# Patient Record
Sex: Male | Born: 1945 | Race: White | Hispanic: Yes | Smoking: Never smoker
Health system: Southern US, Community
[De-identification: ages and names within clinical notes are randomized; demographics above are authoritative.]

## PROBLEM LIST (undated history)

## (undated) DIAGNOSIS — K659 Peritonitis, unspecified: Secondary | ICD-10-CM

## (undated) DIAGNOSIS — Z8719 Personal history of other diseases of the digestive system: Secondary | ICD-10-CM

## (undated) DIAGNOSIS — R160 Hepatomegaly, not elsewhere classified: Secondary | ICD-10-CM

## (undated) DIAGNOSIS — R739 Hyperglycemia, unspecified: Secondary | ICD-10-CM

## (undated) DIAGNOSIS — R339 Retention of urine, unspecified: Secondary | ICD-10-CM

## (undated) DIAGNOSIS — G7000 Myasthenia gravis without (acute) exacerbation: Secondary | ICD-10-CM

## (undated) DIAGNOSIS — T380X5A Adverse effect of glucocorticoids and synthetic analogues, initial encounter: Secondary | ICD-10-CM

## (undated) DIAGNOSIS — N281 Cyst of kidney, acquired: Secondary | ICD-10-CM

## (undated) DIAGNOSIS — Z87438 Personal history of other diseases of male genital organs: Secondary | ICD-10-CM

## (undated) DIAGNOSIS — M545 Low back pain, unspecified: Secondary | ICD-10-CM

## (undated) DIAGNOSIS — N400 Enlarged prostate without lower urinary tract symptoms: Secondary | ICD-10-CM

## (undated) DIAGNOSIS — M479 Spondylosis, unspecified: Secondary | ICD-10-CM

## (undated) DIAGNOSIS — G9340 Encephalopathy, unspecified: Secondary | ICD-10-CM

## (undated) DIAGNOSIS — Z978 Presence of other specified devices: Secondary | ICD-10-CM

## (undated) DIAGNOSIS — I10 Essential (primary) hypertension: Secondary | ICD-10-CM

## (undated) DIAGNOSIS — D62 Acute posthemorrhagic anemia: Secondary | ICD-10-CM

## (undated) DIAGNOSIS — K760 Fatty (change of) liver, not elsewhere classified: Secondary | ICD-10-CM

## (undated) DIAGNOSIS — D509 Iron deficiency anemia, unspecified: Secondary | ICD-10-CM

## (undated) DIAGNOSIS — Z960 Presence of urogenital implants: Secondary | ICD-10-CM

## (undated) DIAGNOSIS — R351 Nocturia: Secondary | ICD-10-CM

## (undated) DIAGNOSIS — K5720 Diverticulitis of large intestine with perforation and abscess without bleeding: Secondary | ICD-10-CM

## (undated) DIAGNOSIS — M5137 Other intervertebral disc degeneration, lumbosacral region: Secondary | ICD-10-CM

## (undated) DIAGNOSIS — K429 Umbilical hernia without obstruction or gangrene: Secondary | ICD-10-CM

## (undated) DIAGNOSIS — R5381 Other malaise: Secondary | ICD-10-CM

## (undated) DIAGNOSIS — M51379 Other intervertebral disc degeneration, lumbosacral region without mention of lumbar back pain or lower extremity pain: Secondary | ICD-10-CM

## (undated) HISTORY — DX: Acute posthemorrhagic anemia: D62

## (undated) HISTORY — DX: Presence of other specified devices: Z97.8

## (undated) HISTORY — DX: Diverticulitis of large intestine with perforation and abscess without bleeding: K57.20

## (undated) HISTORY — DX: Encephalopathy, unspecified: G93.40

## (undated) HISTORY — DX: Hyperglycemia, unspecified: T38.0X5A

## (undated) HISTORY — DX: Adverse effect of glucocorticoids and synthetic analogues, initial encounter: R73.9

## (undated) HISTORY — DX: Other malaise: R53.81

## (undated) HISTORY — DX: Peritonitis, unspecified: K65.9

---

## 2001-07-31 ENCOUNTER — Emergency Department (HOSPITAL_COMMUNITY): Admission: EM | Admit: 2001-07-31 | Discharge: 2001-07-31 | Payer: Self-pay | Admitting: Emergency Medicine

## 2001-07-31 ENCOUNTER — Encounter: Payer: Self-pay | Admitting: Emergency Medicine

## 2001-08-08 ENCOUNTER — Encounter: Admission: RE | Admit: 2001-08-08 | Discharge: 2001-08-08 | Payer: Self-pay | Admitting: Internal Medicine

## 2001-09-16 ENCOUNTER — Inpatient Hospital Stay (HOSPITAL_COMMUNITY): Admission: EM | Admit: 2001-09-16 | Discharge: 2001-09-21 | Payer: Self-pay

## 2001-09-17 ENCOUNTER — Encounter: Payer: Self-pay | Admitting: *Deleted

## 2001-09-17 DIAGNOSIS — N281 Cyst of kidney, acquired: Secondary | ICD-10-CM

## 2001-09-17 DIAGNOSIS — R160 Hepatomegaly, not elsewhere classified: Secondary | ICD-10-CM

## 2001-09-17 HISTORY — PX: ERCP W/ SPHICTEROTOMY: SHX1523

## 2001-09-17 HISTORY — DX: Hepatomegaly, not elsewhere classified: R16.0

## 2001-09-17 HISTORY — DX: Cyst of kidney, acquired: N28.1

## 2001-09-19 ENCOUNTER — Encounter: Payer: Self-pay | Admitting: *Deleted

## 2001-09-21 ENCOUNTER — Encounter: Payer: Self-pay | Admitting: *Deleted

## 2001-09-22 HISTORY — PX: LAPAROSCOPIC CHOLECYSTECTOMY: SUR755

## 2010-09-18 ENCOUNTER — Emergency Department (HOSPITAL_COMMUNITY): Admission: EM | Admit: 2010-09-18 | Payer: Self-pay | Source: Home / Self Care

## 2010-09-18 ENCOUNTER — Emergency Department (HOSPITAL_COMMUNITY)
Admission: EM | Admit: 2010-09-18 | Discharge: 2010-09-18 | Disposition: A | Discharge status: Home / Self Care | Payer: Worker's Compensation | Attending: Emergency Medicine | Admitting: Emergency Medicine

## 2010-09-18 DIAGNOSIS — Y929 Unspecified place or not applicable: Secondary | ICD-10-CM | POA: Insufficient documentation

## 2010-09-18 DIAGNOSIS — W010XXA Fall on same level from slipping, tripping and stumbling without subsequent striking against object, initial encounter: Secondary | ICD-10-CM | POA: Insufficient documentation

## 2010-09-18 DIAGNOSIS — S01501A Unspecified open wound of lip, initial encounter: Secondary | ICD-10-CM | POA: Insufficient documentation

## 2010-09-18 DIAGNOSIS — S0180XA Unspecified open wound of other part of head, initial encounter: Secondary | ICD-10-CM | POA: Insufficient documentation

## 2010-09-18 DIAGNOSIS — R51 Headache: Secondary | ICD-10-CM | POA: Insufficient documentation

## 2010-09-26 ENCOUNTER — Emergency Department (HOSPITAL_COMMUNITY)
Admission: EM | Admit: 2010-09-26 | Discharge: 2010-09-26 | Disposition: A | Discharge status: Home / Self Care | Payer: Worker's Compensation | Attending: Emergency Medicine | Admitting: Emergency Medicine

## 2010-09-26 DIAGNOSIS — Z4802 Encounter for removal of sutures: Secondary | ICD-10-CM | POA: Insufficient documentation

## 2012-07-02 ENCOUNTER — Ambulatory Visit (INDEPENDENT_AMBULATORY_CARE_PROVIDER_SITE_OTHER): Payer: BC Managed Care – PPO | Admitting: Emergency Medicine

## 2012-07-02 VITALS — BP 134/84 | HR 81 | Temp 98.4°F | Resp 18 | Wt 182.0 lb

## 2012-07-02 DIAGNOSIS — R351 Nocturia: Secondary | ICD-10-CM

## 2012-07-02 DIAGNOSIS — R300 Dysuria: Secondary | ICD-10-CM

## 2012-07-02 DIAGNOSIS — N400 Enlarged prostate without lower urinary tract symptoms: Secondary | ICD-10-CM

## 2012-07-02 LAB — POCT URINALYSIS DIPSTICK
Bilirubin, UA: NEGATIVE
Glucose, UA: NEGATIVE
Ketones, UA: NEGATIVE
Leukocytes, UA: NEGATIVE
Nitrite, UA: NEGATIVE
Protein, UA: 30
Spec Grav, UA: 1.025
Urobilinogen, UA: 0.2
pH, UA: 5.5

## 2012-07-02 LAB — POCT UA - MICROSCOPIC ONLY
Bacteria, U Microscopic: NEGATIVE
Casts, Ur, LPF, POC: NEGATIVE
Crystals, Ur, HPF, POC: NEGATIVE
Mucus, UA: POSITIVE
Yeast, UA: NEGATIVE

## 2012-07-02 LAB — PSA: PSA: 13.57 ng/mL — ABNORMAL HIGH (ref <=4.00)

## 2012-07-02 MED ORDER — TAMSULOSIN HCL 0.4 MG PO CAPS: 0.4000 mg | ORAL_CAPSULE | Freq: Every day | ORAL | Status: DC

## 2012-07-02 NOTE — Progress Notes (Signed)
[facility] [address] [place] [address] [phone]  Date:  07/02/2012   Name:  Danny Walker   DOB:  1945-08-19   MRN:  [id number]  PCP:  No primary provider on file.    Chief Complaint: Dysuria, Back Pain and Abdominal Pain   History of Present Illness:  Danny Walker is a 67 y.o. very pleasant male patient who presents with the following:  Has nocturia and decreased urine stream.  Now has back pain and suprapubic dyscomfort.  No fever or chills, nausea or vomiting.  Denies hesitancy or dribbling. Concerned that his prostate is enlarged.  There is no problem list on file for this patient.   No past medical history on file.  No past surgical history on file.  History  Substance Use Topics  . Smoking status: Never Smoker   . Smokeless tobacco: Not on file  . Alcohol Use: Not on file    No family history on file.  No Known Allergies  Medication list has been reviewed and updated.  No current outpatient prescriptions on file prior to visit.   No current facility-administered medications on file prior to visit.    Review of Systems:  As per HPI, otherwise negative.    Physical Examination: Filed Vitals:   07/02/12 1315  BP: 134/84  Pulse: 81  Temp: 98.4 F (36.9 C)  Resp: 18   Filed Vitals:   07/02/12 1315  Weight: 182 lb (82.555 kg)   There is no height on file to calculate BMI. Ideal Body Weight:    GEN: WDWN, NAD, Non-toxic, A & O x 3 HEENT: Atraumatic, Normocephalic. Neck supple. No masses, No LAD. Ears and Nose: No external deformity. CV: RRR, No M/G/R. No JVD. No thrill. No extra heart sounds. PULM: CTA B, no wheezes, crackles, rhonchi. No retractions. No resp. distress. No accessory muscle use. ABD: S, NT, ND, +BS. No rebound. No HSM. EXTR: No c/c/e NEURO Normal gait.  PSYCH: Normally interactive. Conversant. Not depressed or anxious appearing.  Calm demeanor.    Assessment and  Plan:  BPH flomax Await PSA  Carmelina Dane, MD  Results for orders placed in visit on 07/02/12  POCT UA - MICROSCOPIC ONLY      Result Value Range   WBC, Ur, HPF, POC 0-1     RBC, urine, microscopic 0-1     Bacteria, U Microscopic negative     Mucus, UA positive     Epithelial cells, urine per micros 0-2     Crystals, Ur, HPF, POC negative     Casts, Ur, LPF, POC negative     Yeast, UA negative    POCT URINALYSIS DIPSTICK      Result Value Range   Color, UA yellow     Clarity, UA cloudy     Glucose, UA negative     Bilirubin, UA negative     Ketones, UA negative     Spec Grav, UA 1.025     Blood, UA trace     pH, UA 5.5     Protein, UA 30     Urobilinogen, UA 0.2     Nitrite, UA negative     Leukocytes, UA Negative

## 2012-07-02 NOTE — Patient Instructions (Addendum)
Hiperplasia prosttica benigna (Benign Prostatic Hyperplasia) El tamao de su prstata est aumentado. Se trata de un problema muy frecuente [employer] varones de [place]. Esto se denomina HPB. Significa hiperplasia prosttica benigna. La glndula prosttica se ubica en la base de la vejiga. Cuando la prstata aumenta de tamao, obstruye la [place]. La uretra es el conducto que drena la orina desde la vejiga.  SNTOMAS   Disminucin del chorro miccional.  La orina sale a pequeos chorros.  Sensacin de que la vejiga no se vaca completamente.  Problemas para comenzar a Geographical information systems officer.  Levantarse a orinar con frecuencia por las noches.  Orinar con ms frecuencia Administrator. Una obstruccin urinaria completa o el dolor intenso requieren atencin inmediata. DIAGNSTICO  El profesional se formar una idea exacta de su problema a travs de la historia clnica y el examen fsico.  Tambin pueden tomarse radiografas especficas. TRATAMIENTO  En los casos leves no ser necesario Psychologist, clinical.  Si el problema es [place], los medicamentos pueden proporcionar alivio. Algunos de estos medicamentos reducen el tamao de la prstata. Es comn el uso del fruto de la palma enana americana para aliviar este problema.  Si se produce un bloqueo completo, le colocarn un catter [place] durante [employer].  En los casos ms graves se tratar con [place]. La RTU de prstata es la ciruga que se realiza a travs de Engineer, mining. RTU de prstata significa reseccin transuretral. Implica la reseccin de pequeos sectores de la prstata. Se retiran a travs del pene.  Las tcnicas utilizan calor, microondas y rayos lser para remover la obstruccin. INSTRUCCIONES PARA EL CUIDADO DOMICILIARIO  Tmese su tiempo para orinar.  No consuma alcohol.  Las bebidas que contienen cafena, tales como el caf el t y las bebidas cola pueden empeorar el problema.  Tambin se desaconseja el uso de  antihistamnicos y algunos medicamentos que pueden empeorar el problema.  Concurra a las consultas de control con el profesional que lo asiste, segn le haya indicado. SOLICITE ATENCIN MDICA DE INMEDIATO SI:  Aumenta el dolor al orinar o no puede eliminar la orina.  Presenta dolor abdominal intenso, vmitos, fiebre o desmayos.  Presenta un dolor sbito e intenso en la espalda u observa sangre en la orina. EST SEGURO QUE:   Comprende las instrucciones para el alta mdica.  Controlar su enfermedad.  Solicitar atencin mdica de inmediato segn las indicaciones. Document Released: 05/09/2005 Document Revised: 08/01/2011 [facility] Patient Information 2013 [place], [place].

## 2012-07-03 ENCOUNTER — Encounter: Payer: Self-pay | Admitting: Family Medicine

## 2012-07-03 ENCOUNTER — Ambulatory Visit (INDEPENDENT_AMBULATORY_CARE_PROVIDER_SITE_OTHER): Payer: BC Managed Care – PPO | Admitting: Family Medicine

## 2012-07-03 VITALS — BP 158/69 | HR 83 | Temp 98.5°F | Resp 16 | Wt 180.0 lb

## 2012-07-03 DIAGNOSIS — H04159 Secondary lacrimal gland atrophy, unspecified lacrimal gland: Secondary | ICD-10-CM

## 2012-07-03 DIAGNOSIS — H04123 Dry eye syndrome of bilateral lacrimal glands: Secondary | ICD-10-CM

## 2012-07-03 DIAGNOSIS — M545 Low back pain, unspecified: Secondary | ICD-10-CM

## 2012-07-03 DIAGNOSIS — R102 Pelvic and perineal pain: Secondary | ICD-10-CM

## 2012-07-03 DIAGNOSIS — R42 Dizziness and giddiness: Secondary | ICD-10-CM

## 2012-07-03 DIAGNOSIS — N410 Acute prostatitis: Secondary | ICD-10-CM

## 2012-07-03 DIAGNOSIS — R109 Unspecified abdominal pain: Secondary | ICD-10-CM

## 2012-07-03 LAB — POCT URINALYSIS DIPSTICK
Bilirubin, UA: NEGATIVE
Glucose, UA: NEGATIVE
Ketones, UA: NEGATIVE
Leukocytes, UA: NEGATIVE
Nitrite, UA: NEGATIVE
Protein, UA: 30
Spec Grav, UA: 1.02
Urobilinogen, UA: 0.2
pH, UA: 6

## 2012-07-03 LAB — POCT UA - MICROSCOPIC ONLY
Bacteria, U Microscopic: NEGATIVE
Casts, Ur, LPF, POC: NEGATIVE
Crystals, Ur, HPF, POC: NEGATIVE
Epithelial cells, urine per micros: NEGATIVE
Mucus, UA: NEGATIVE
WBC, Ur, HPF, POC: NEGATIVE
Yeast, UA: NEGATIVE

## 2012-07-03 LAB — POCT CBC
Granulocyte percent: 63.7 %G (ref 37–80)
HCT, POC: 44.7 % (ref 43.5–53.7)
Hemoglobin: 14.5 g/dL (ref 14.1–18.1)
Lymph, poc: 2.5 (ref 0.6–3.4)
MCH, POC: 30.4 pg (ref 27–31.2)
MCHC: 32.4 g/dL (ref 31.8–35.4)
MCV: 93.8 fL (ref 80–97)
MID (cbc): 0.5 (ref 0–0.9)
MPV: 7.7 fL (ref 0–99.8)
POC Granulocyte: 5.4 (ref 2–6.9)
POC LYMPH PERCENT: 30 %L (ref 10–50)
POC MID %: 6.3 %M (ref 0–12)
Platelet Count, POC: 295 10*3/uL (ref 142–424)
RBC: 4.77 M/uL (ref 4.69–6.13)
RDW, POC: 13.8 %
WBC: 8.5 10*3/uL (ref 4.6–10.2)

## 2012-07-03 MED ORDER — CIPROFLOXACIN HCL 500 MG PO TABS: 500.0000 mg | ORAL_TABLET | Freq: Two times a day (BID) | ORAL | Status: DC

## 2012-07-03 MED ORDER — CEFTRIAXONE SODIUM 1 G IJ SOLR
1.0000 g | INTRAMUSCULAR | Status: DC
  Administered 2012-07-03: 1 g via INTRAMUSCULAR

## 2012-07-03 NOTE — Progress Notes (Signed)
Subjective:    Patient ID: Danny Walker, male    DOB: 05/03/1946, 67 y.o.   MRN: [id number]  HPI [name] is a 67 y.o. male Seen yesterday by Dr. Dareen Piano with hx of nocturia and decreased urine stream, then back pain and suprapubic dyscomfort. No fever or chills, nausea or vomiting. Denied hesitancy or dribbling. Results for orders placed in visit on 07/02/12  PSA      Result Value Range   PSA 13.57 (*) <=4.00 ng/mL  POCT UA - MICROSCOPIC ONLY      Result Value Range   WBC, Ur, HPF, POC 0-1     RBC, urine, microscopic 0-1     Bacteria, U Microscopic negative     Mucus, UA positive     Epithelial cells, urine per micros 0-2     Crystals, Ur, HPF, POC negative     Casts, Ur, LPF, POC negative     Yeast, UA negative    POCT URINALYSIS DIPSTICK      Result Value Range   Color, UA yellow     Clarity, UA cloudy     Glucose, UA negative     Bilirubin, UA negative     Ketones, UA negative     Spec Grav, UA 1.025     Blood, UA trace     pH, UA 5.5     Protein, UA 30     Urobilinogen, UA 0.2     Nitrite, UA negative     Leukocytes, UA Negative    diagnosed with prostatitis, started on flomax.   He now his here for follow up - feels like he had pain in back, kidneys.  Did take flomax last night, but woke up overnight more sore in back.   No fever. No vomiting. Still with dysuria. Sore around eyes - but vision ok.  Usually feels soreness around eyes with illness. Was having difficulty starting urination - has not noticed.   Noted some dizziness today after taking flomax last night. Also complains of breathing difficulty when he was dizzy - feels like he was anxious at the time, and this contributed to his shortness of breath.  No chest pain. No history of heart problems. No dyspnea now.   Language barrier - spanish spoken - understanding expressed.   Review of Systems  Constitutional: Negative for fever and chills.  Gastrointestinal: Positive for abdominal pain.  Negative for nausea and vomiting.  Genitourinary: Positive for dysuria, urgency, frequency and difficulty urinating. Negative for discharge, penile swelling, scrotal swelling, penile pain and testicular pain.       Objective:   Physical Exam  Vitals reviewed. Constitutional: He is oriented to person, place, and time. He appears well-developed and well-nourished. No distress.  HENT:  Head: Normocephalic and atraumatic.  Cardiovascular: Normal rate, regular rhythm, normal heart sounds and intact distal pulses.   Pulmonary/Chest: Effort normal and breath sounds normal. No respiratory distress. He has no wheezes.  Abdominal: Normal appearance. There is tenderness in the suprapubic area. There is CVA tenderness. There is no rebound.  Neurological: He is alert and oriented to person, place, and time.  Skin: Skin is warm. He is not diaphoretic.  Psychiatric: He has a normal mood and affect. His behavior is normal.   Results for orders placed in visit on 07/03/12  POCT URINALYSIS DIPSTICK      Result Value Range   Color, UA yellow     Clarity, UA clear     Glucose, UA  neg     Bilirubin, UA neg     Ketones, UA neg     Spec Grav, UA 1.020     Blood, UA trace-lysed     pH, UA 6.0     Protein, UA 30     Urobilinogen, UA 0.2     Nitrite, UA neg     Leukocytes, UA Negative    POCT UA - MICROSCOPIC ONLY      Result Value Range   WBC, Ur, HPF, POC neg     RBC, urine, microscopic 0-1     Bacteria, U Microscopic neg     Mucus, UA neg     Epithelial cells, urine per micros neg     Crystals, Ur, HPF, POC neg     Casts, Ur, LPF, POC neg     Yeast, UA neg    POCT CBC      Result Value Range   WBC 8.5  4.6 - 10.2 K/uL   Lymph, poc 2.5  0.6 - 3.4   POC LYMPH PERCENT 30.0  10 - 50 %L   MID (cbc) 0.5  0 - 0.9   POC MID % 6.3  0 - 12 %M   POC Granulocyte 5.4  2 - 6.9   Granulocyte percent 63.7  37 - 80 %G   RBC 4.77  4.69 - 6.13 M/uL   Hemoglobin 14.5  14.1 - 18.1 g/dL   HCT, POC 16.1   09.6 - 53.7 %   MCV 93.8  80 - 97 fL   MCH, POC 30.4  27 - 31.2 pg   MCHC 32.4  31.8 - 35.4 g/dL   RDW, POC 04.5     Platelet Count, POC 295  142 - 424 K/uL   MPV 7.7  0 - 99.8 fL       Assessment & Plan:  Danny Walker is a 67 y.o. male  1. Prostatitis, acute  POCT urinalysis dipstick   POCT urinalysis dipstick   POCT UA - Microscopic Only   POCT CBC   cefTRIAXone (ROCEPHIN) injection 1 g   ciprofloxacin (CIPRO) 500 MG tablet   Urine culture   Basic metabolic panel  2. Low back pain  POCT CBC   POCT CBC   Basic metabolic panel  3. Suprapubic abdominal pain  POCT urinalysis dipstick   POCT urinalysis dipstick   POCT UA - Microscopic Only   POCT CBC   Basic metabolic panel  4. Dizziness and giddiness    5. Dry eyes, bilateral       LBP, urinary frequency/hesitancy and dysuria - likely prostatitis - acute with hematuria, ddx includes nephrolith with back pain, but elevated PSA.   rocephin 1 gram given, start cipro, urine cx.  Recheck in 48 hours.  Push fluids.   Dizziness, burning in eyes may be due to anticholinergic side effects of flomax. Artificial tears, orthostatic precautions, increase fluids, but if unable to tolerate - can stop flomax.  Urinary retention precautions and s/sx reviewed.   Lang barrier - spanish spoken, and translator present - understanding expressed.   Patient Instructions  Recheck in 2-3 days.  Start the antibiotic tonight. Return to the clinic or go to the nearest emergency room if any of your symptoms worsen or new symptoms occur.   Prostatitis The prostate gland is about the size and shape of a walnut. It is located just below your bladder. It produces one of the components of semen, which is made up of  sperm and the fluids that help nourish and transport it out from the testicles. Prostatitis is redness, soreness, and swelling (inflammation) of the prostate gland.  There are 3 types of prostatitis:  Acute bacterial prostatitis This is  the least common type of prostatitis. It starts quickly and usually leads to a bladder infection. It can occur at any age.  Chronic bacterial prostatitis This is a persistent bacterial infection in the prostate.It usually develops from repeated acute bacterial prostatitis or acute bacterial prostatitis that was not properly treated. It can occur in men of any age but is most common in middle-aged men whose prostate has begun to enlarge.  Chronic prostatitis chronic pelvic pain syndrome This is the most common type of prostatitis. It is inflammation of the prostate gland that is not caused by a bacterial infection. The cause is unknown. CAUSES The cause of acute and chronic bacterial prostatitis is a bacterial infection. The exact cause of chronic prostatitis and chronic pelvic pain syndrome and asymptomatic inflammatory prostatitis is unknown.  SYMPTOMS  Symptoms can vary depending upon the type of prostatitis that exists. There can also be overlap in symptoms. Possible symptoms for each type of prostatitis are listed below. Acute bacterial prostatitis  Painful urination.  Fever or chills.  Muscle or joint pains.  Low back pain.  Low abdominal pain.  Inability to empty bladder completely.  Sudden urge to urinate.  Frequent urination.  Difficulty starting urine stream.  Weak urine stream.  Discharge from the urethra.  Dribbling after urination.  Rectal pain.  Pain in the testicles, penis, or tip of the penis.  Pain in the space between the anus and scrotum (perineum).  Problems with sexual function.  Painful ejaculation.  Bloody semen. Chronic bacterial prostatitis  The symptoms are similar to those of acute bacterial prostatitis, but they usually are much less severe. Fever, chills, and muscle and joint pain are not associated with chronic bacterial prostatitis. Chronic prostatitis chronic pelvic pain syndrome  Symptoms typically include a dull ache in the scrotum  and the perineum. DIAGNOSIS  In order to diagnose prostatitis, your caregiver will ask about your symptoms. If acute or chronic bacterial prostatitis is suspected, a urine sample will be taken and tested (urinalysis). This is to see if there is bacteria in your urine. If the urinalysis result is negative for bacteria, your caregiver may use a finger to feel your prostate (digital rectal exam). This exam helps your caregiver determine if your prostate is swollen and tender. TREATMENT  Treatment for prostatitis depends on the cause. If a bacterial infection is the cause, it can be treated with antibiotic medicine. In cases of chronic bacterial prostatitis, the use of antibiotics for up to 1 month may be necessary. Your caregiver may instruct you to take sitz baths to help relieve pain. A sitz bath is a bath of hot water in which your hips and buttocks are under water. HOME CARE INSTRUCTIONS   Take all medicines as directed by your caregiver.  Take sitz baths as directed by your caregiver. SEEK MEDICAL CARE IF:   Your symptoms get worse, not better.  You have a fever. SEEK IMMEDIATE MEDICAL CARE IF:   You have chills.  You feel nauseous or vomit.  You feel lightheaded or faint.  You are unable to urinate.  You have blood or blood clots in your urine. Document Released: 05/06/2000 Document Revised: 08/01/2011 Document Reviewed: 04/11/2011 [facility] Patient Information 2013 [place], [place].

## 2012-07-03 NOTE — Addendum Note (Signed)
Addended by: Carmelina Dane on: 07/03/2012 03:17 PM   Modules accepted: Orders

## 2012-07-03 NOTE — Patient Instructions (Signed)
Recheck in 2-3 days.  Start the antibiotic tonight. Return to the clinic or go to the nearest emergency room if any of your symptoms worsen or new symptoms occur.   Prostatitis The prostate gland is about the size and shape of a walnut. It is located just below your bladder. It produces one of the components of semen, which is made up of sperm and the fluids that help nourish and transport it out from the testicles. Prostatitis is redness, soreness, and swelling (inflammation) of the prostate gland.  There are 3 types of prostatitis:  Acute bacterial prostatitis This is the least common type of prostatitis. It starts quickly and usually leads to a bladder infection. It can occur at any age.  Chronic bacterial prostatitis This is a persistent bacterial infection in the prostate.It usually develops from repeated acute bacterial prostatitis or acute bacterial prostatitis that was not properly treated. It can occur in men of any age but is most common in middle-aged men whose prostate has begun to enlarge.  Chronic prostatitis chronic pelvic pain syndrome This is the most common type of prostatitis. It is inflammation of the prostate gland that is not caused by a bacterial infection. The cause is unknown. CAUSES The cause of acute and chronic bacterial prostatitis is a bacterial infection. The exact cause of chronic prostatitis and chronic pelvic pain syndrome and asymptomatic inflammatory prostatitis is unknown.  SYMPTOMS  Symptoms can vary depending upon the type of prostatitis that exists. There can also be overlap in symptoms. Possible symptoms for each type of prostatitis are listed below. Acute bacterial prostatitis  Painful urination.  Fever or chills.  Muscle or joint pains.  Low back pain.  Low abdominal pain.  Inability to empty bladder completely.  Sudden urge to urinate.  Frequent urination.  Difficulty starting urine stream.  Weak urine stream.  Discharge from the  urethra.  Dribbling after urination.  Rectal pain.  Pain in the testicles, penis, or tip of the penis.  Pain in the space between the anus and scrotum (perineum).  Problems with sexual function.  Painful ejaculation.  Bloody semen. Chronic bacterial prostatitis  The symptoms are similar to those of acute bacterial prostatitis, but they usually are much less severe. Fever, chills, and muscle and joint pain are not associated with chronic bacterial prostatitis. Chronic prostatitis chronic pelvic pain syndrome  Symptoms typically include a dull ache in the scrotum and the perineum. DIAGNOSIS  In order to diagnose prostatitis, your caregiver will ask about your symptoms. If acute or chronic bacterial prostatitis is suspected, a urine sample will be taken and tested (urinalysis). This is to see if there is bacteria in your urine. If the urinalysis result is negative for bacteria, your caregiver may use a finger to feel your prostate (digital rectal exam). This exam helps your caregiver determine if your prostate is swollen and tender. TREATMENT  Treatment for prostatitis depends on the cause. If a bacterial infection is the cause, it can be treated with antibiotic medicine. In cases of chronic bacterial prostatitis, the use of antibiotics for up to 1 month may be necessary. Your caregiver may instruct you to take sitz baths to help relieve pain. A sitz bath is a bath of hot water in which your hips and buttocks are under water. HOME CARE INSTRUCTIONS   Take all medicines as directed by your caregiver.  Take sitz baths as directed by your caregiver. SEEK MEDICAL CARE IF:   Your symptoms get worse, not better.  You  have a fever. SEEK IMMEDIATE MEDICAL CARE IF:   You have chills.  You feel nauseous or vomit.  You feel lightheaded or faint.  You are unable to urinate.  You have blood or blood clots in your urine. Document Released: 05/06/2000 Document Revised: 08/01/2011 Document  Reviewed: 04/11/2011 [facility] Patient Information 2013 [place], [place].

## 2012-07-04 LAB — BASIC METABOLIC PANEL
BUN: 16 mg/dL (ref 6–23)
CO2: 27 mEq/L (ref 19–32)
Calcium: 9.5 mg/dL (ref 8.4–10.5)
Chloride: 105 mEq/L (ref 96–112)
Creat: 0.81 mg/dL (ref 0.50–1.35)
Glucose, Bld: 101 mg/dL — ABNORMAL HIGH (ref 70–99)
Potassium: 3.5 mEq/L (ref 3.5–5.3)
Sodium: 143 mEq/L (ref 135–145)

## 2012-07-05 LAB — URINE CULTURE
Colony Count: NO GROWTH
Organism ID, Bacteria: NO GROWTH

## 2012-07-08 ENCOUNTER — Ambulatory Visit (INDEPENDENT_AMBULATORY_CARE_PROVIDER_SITE_OTHER): Payer: BC Managed Care – PPO | Admitting: Family Medicine

## 2012-07-08 VITALS — BP 113/75 | HR 98 | Temp 98.1°F | Resp 18 | Ht 66.0 in | Wt 182.0 lb

## 2012-07-08 DIAGNOSIS — Z789 Other specified health status: Secondary | ICD-10-CM

## 2012-07-08 DIAGNOSIS — Z609 Problem related to social environment, unspecified: Secondary | ICD-10-CM

## 2012-07-08 DIAGNOSIS — N410 Acute prostatitis: Secondary | ICD-10-CM

## 2012-07-08 MED ORDER — CIPROFLOXACIN HCL 500 MG PO TABS: 500.0000 mg | ORAL_TABLET | Freq: Two times a day (BID) | ORAL | Status: DC

## 2012-07-08 NOTE — Patient Instructions (Addendum)
Tome antibioticos por 2 semanas en total (yo prescibo 8 mas pastillas a su farmacia). regrese despues antibioticos por cheqiuar laboratorio de prostata. regrese mas temprano si peor.  Prostatitis  (Prostatitis)  La prstata tiene aproximadamente el tamao y la forma de Sharee Pimple. Se ubica justo por arriba de la vejiga. Produce uno de los componentes del semen, formado por los espermatozoides y los lquidos que ayudan a nutrirlos y transportarlos fuera de los testculos. La prostatitis es el enrojecimiento, dolor e hinchazn (inflamacin) de la glndula prosttica.  Hay 3 tipos de prostatitis:   La prostatitis bacteriana aguda es el tipo menos frecuente de prostatitis. Se inicia rpidamente y por lo general conduce a una infeccin de la vejiga. Puede ocurrir a Actuary.  Prostatitis bacteriana crnica - Es una infeccin bacteriana persistente en la prstata. Generalmente aparece luego de prostatitis bacteriana aguda que se repite o que no fue tratada adecuadamente. Se puede presentar en hombres de cualquier edad pero es ms comn en los hombres de mediana edad cuya prstata ha comenzado a Government social research officer.  La prostatitis crnica, sndrome de dolor plvico crnico, es el tipo ms frecuente de prostatitis. Es la inflamacin de la prstata que no se origina en una infeccin bacteriana La causa es desconocida. CAUSAS  La causa de la prostatitis [place] y de la crnica es una infeccin bacteriana. La causa exacta de la prostatitis crnica, del sndrome de dolor plvico crnico y de la prostatitis inflamatoria asintomtica se desconoce.  SNTOMAS  Los sntomas pueden variar segn el tipo de prostatitis. Tambin puede puede ocurrir que los sntomas estn ocultos. Los sntomas posibles para cada tipo de prostatitis se enumeran a continuacin.  Prostatitis bacteriana aguda   Dolor al [employer].  Fiebre o escalofros.  Dolor muscular o en las articulaciones.  Dolor lumbar.  Dolor abdominal en la  zona inferior del abdomen.  Imposibilidad de vaciar la vejiga completamente.  Urgencia repentina de orinar.  Ganas de orinar con frecuencia.  Dificultad para comenzar a eliminar la orina.  Chorro de orina dbil.  Secrecin por Engineer, mining.  Goteo al terminar de Geographical information systems officer.  Dolor rectal.  Dolor en los testculos, el pene, o punta del pene.  Dolor en el espacio entre el ano y el escroto (perineo).  Problemas con la funcin sexual.  Eyaculacin dolorosa.  Semen con sangre. Prostatitis bacteriana crnica   Los sntomas son similares a los de la prostatitis bacteriana aguda, pero por lo general son mucho menos intensos. La fiebre, los escalofros y los dolores musculares y articulares no se asocian con la prostatitis bacteriana crnica. Prostatitis crnica - sndrome de dolor plvico crnico   Los sntomas incluyen un dolor sordo en el escroto y en el perineo. DIAGNSTICO  Su mdico le preguntar acerca de los sntomas para hacer el diagnstico de prostatitis. Si se sospecha prostatitis bacteriana [place] o crnica, se tomar [place] para analizarla (anlisis de [place]). Esto se hace para ver si hay bacterias en la orina. Si el resultado del anlisis de [place] es negativo para bacterias, su mdico podr palpar su prstata con un dedo ( examen digital rectal). Este examen ayuda a su mdico a determinar si la prstata est inflamada y sensible.  TRATAMIENTO  El tratamiento de la prostatitis depende de la causa. Si una infeccin bacteriana es la causa, puede ser tratada con antibiticos. En los casos de prostatitis bacteriana crnica, puede ser necesario el uso de antibiticos durante un mximo de 1 mes. Su mdico puede indicarle que tome  baos de asiento para Engineer, materials. Un bao de asiento es un bao de agua caliente en la que las caderas y las nalgas estn bajo el agua.  INSTRUCCIONES PARA EL CUIDADO EN EL HOGAR   Tome todos los medicamentos segn le indic su  mdico.  Tome los baos de asiento segn le indic su mdico. SOLICITE ATENCIN MDICA SI:   Los sntomas empeoran en lugar de mejorar.  Tiene fiebre. SOLICITE ATENCIN MDICA DE INMEDIATO SI:   Siente escalofros.  Siente nuseas o vomita.  Se siente mareado o sufre un desmayo.  Nota que no puede orinar.  Tiene un nivel moderado o grande de cetonas en la [place]. Document Released: 02/16/2005 Document Revised: 08/01/2011 [facility] Patient Information 2013 [place], [place].

## 2012-07-08 NOTE — Addendum Note (Signed)
Addended by: Meredith Staggers R on: 07/08/2012 11:29 AM   Modules accepted: Orders

## 2012-07-08 NOTE — Progress Notes (Addendum)
Subjective:    Patient ID: Danny Walker, male    DOB: 1946-03-08, 67 y.o.   MRN: [id number]  HPI Danny Walker is a 67 y.o. male Seen 5 days ago -  LBP, urinary frequency/hesitancy and dysuria - likely prostatitis - acute with hematuria, ddx included nephrolith with back pain,  elevated PSA - given  rocephin 1 gram started cipro 500mg  bid. Prior lab results: Results for orders placed in visit on 07/03/12  URINE CULTURE      Result Value Range   Colony Count NO GROWTH     Organism ID, Bacteria NO GROWTH    BASIC METABOLIC PANEL      Result Value Range   Sodium 143  135 - 145 mEq/L   Potassium 3.5  3.5 - 5.3 mEq/L   Chloride 105  96 - 112 mEq/L   CO2 27  19 - 32 mEq/L   Glucose, Bld 101 (*) 70 - 99 mg/dL   BUN 16  6 - 23 mg/dL   Creat 1.47  8.29 - 5.62 mg/dL   Calcium 9.5  8.4 - 13.0 mg/dL  POCT URINALYSIS DIPSTICK      Result Value Range   Color, UA yellow     Clarity, UA clear     Glucose, UA neg     Bilirubin, UA neg     Ketones, UA neg     Spec Grav, UA 1.020     Blood, UA trace-lysed     pH, UA 6.0     Protein, UA 30     Urobilinogen, UA 0.2     Nitrite, UA neg     Leukocytes, UA Negative    POCT UA - MICROSCOPIC ONLY      Result Value Range   WBC, Ur, HPF, POC neg     RBC, urine, microscopic 0-1     Bacteria, U Microscopic neg     Mucus, UA neg     Epithelial cells, urine per micros neg     Crystals, Ur, HPF, POC neg     Casts, Ur, LPF, POC neg     Yeast, UA neg    POCT CBC      Result Value Range   WBC 8.5  4.6 - 10.2 K/uL   Lymph, poc 2.5  0.6 - 3.4   POC LYMPH PERCENT 30.0  10 - 50 %L   MID (cbc) 0.5  0 - 0.9   POC MID % 6.3  0 - 12 %M   POC Granulocyte 5.4  2 - 6.9   Granulocyte percent 63.7  37 - 80 %G   RBC 4.77  4.69 - 6.13 M/uL   Hemoglobin 14.5  14.1 - 18.1 g/dL   HCT, POC 86.5  78.4 - 53.7 %   MCV 93.8  80 - 97 fL   MCH, POC 30.4  27 - 31.2 pg   MCHC 32.4  31.8 - 35.4 g/dL   RDW, POC 69.6     Platelet Count, POC 295  142 - 424  K/uL   MPV 7.7  0 - 99.8 fL   Language barrier - spanish spoken.   Felt better last night., but felt about the same until then.  No more dizziness, less dryness/soreness in eyes - seeing well. Minimal soreness in abdomen.  No N/V. minimal soreness in back. No fever. No further dysuria,  2 BM's qd - slightly liquid, but only twice per day, no blood in stool.  Review of Systems  Genitourinary: Negative for dysuria, urgency, frequency, hematuria, scrotal swelling and testicular pain.  Musculoskeletal: Positive for back pain (minimal - improved. ).       Objective:   Physical Exam  Vitals reviewed. Constitutional: He is oriented to person, place, and time. He appears well-developed and well-nourished. No distress.  HENT:  Head: Normocephalic and atraumatic.  Eyes: EOM are normal. Pupils are equal, round, and reactive to light.  Cardiovascular: Normal rate, regular rhythm, normal heart sounds and intact distal pulses.   Pulmonary/Chest: Effort normal and breath sounds normal.  Abdominal: Soft. Bowel sounds are normal. He exhibits no distension. There is no tenderness.  Musculoskeletal:       Lumbar back: Normal. He exhibits normal range of motion, no tenderness and no bony tenderness.  Neurological: He is alert and oriented to person, place, and time.  Skin: Skin is warm and dry.  Psychiatric: He has a normal mood and affect. His behavior is normal.       Assessment & Plan:  Danny Walker is a 67 y.o. male Prostatitis, acute - improving.  Continue cipro for 2 weeks, recheck after completion of meds for repeat PSA.,  rtc precautions.   Language barrier  - spanish spoken, understanding expressed.  Patient Instructions  Tome antibioticos por 2 semanas en total (yo prescibo 8 mas pastillas a su farmacia). regrese despues antibioticos por cheqiuar laboratorio de prostata. regrese mas temprano si peor.  Prostatitis  (Prostatitis)  La prstata tiene aproximadamente el tamao y  la forma de Sharee Pimple. Se ubica justo por arriba de la vejiga. Produce uno de los componentes del semen, formado por los espermatozoides y los lquidos que ayudan a nutrirlos y transportarlos fuera de los testculos. La prostatitis es el enrojecimiento, dolor e hinchazn (inflamacin) de la glndula prosttica.  Hay 3 tipos de prostatitis:   La prostatitis bacteriana aguda es el tipo menos frecuente de prostatitis. Se inicia rpidamente y por lo general conduce a una infeccin de la vejiga. Puede ocurrir a Actuary.  Prostatitis bacteriana crnica - Es una infeccin bacteriana persistente en la prstata. Generalmente aparece luego de prostatitis bacteriana aguda que se repite o que no fue tratada adecuadamente. Se puede presentar en hombres de cualquier edad pero es ms comn en los hombres de mediana edad cuya prstata ha comenzado a Government social research officer.  La prostatitis crnica, sndrome de dolor plvico crnico, es el tipo ms frecuente de prostatitis. Es la inflamacin de la prstata que no se origina en una infeccin bacteriana La causa es desconocida. CAUSAS  La causa de la prostatitis [place] y de la crnica es una infeccin bacteriana. La causa exacta de la prostatitis crnica, del sndrome de dolor plvico crnico y de la prostatitis inflamatoria asintomtica se desconoce.  SNTOMAS  Los sntomas pueden variar segn el tipo de prostatitis. Tambin puede puede ocurrir que los sntomas estn ocultos. Los sntomas posibles para cada tipo de prostatitis se enumeran a continuacin.  Prostatitis bacteriana aguda   Dolor al [employer].  Fiebre o escalofros.  Dolor muscular o en las articulaciones.  Dolor lumbar.  Dolor abdominal en la zona inferior del abdomen.  Imposibilidad de vaciar la vejiga completamente.  Urgencia repentina de orinar.  Ganas de orinar con frecuencia.  Dificultad para comenzar a eliminar la orina.  Chorro de orina dbil.  Secrecin por Engineer, mining.  Goteo al  terminar de Geographical information systems officer.  Dolor rectal.  Dolor en los testculos, el pene, o punta del pene.  Dolor en  el espacio entre el ano y Insurance account manager (perineo).  Problemas con la funcin sexual.  Eyaculacin dolorosa.  Semen con sangre. Prostatitis bacteriana crnica   Los sntomas son similares a los de la prostatitis bacteriana aguda, pero por lo general son mucho menos intensos. La fiebre, los escalofros y los dolores musculares y articulares no se asocian con la prostatitis bacteriana crnica. Prostatitis crnica - sndrome de dolor plvico crnico   Los sntomas incluyen un dolor sordo en el escroto y en el perineo. DIAGNSTICO  Su mdico le preguntar acerca de los sntomas para hacer el diagnstico de prostatitis. Si se sospecha prostatitis bacteriana [place] o crnica, se tomar [place] para analizarla (anlisis de [place]). Esto se hace para ver si hay bacterias en la orina. Si el resultado del anlisis de [place] es negativo para bacterias, su mdico podr palpar su prstata con un dedo ( examen digital rectal). Este examen ayuda a su mdico a determinar si la prstata est inflamada y sensible.  TRATAMIENTO  El tratamiento de la prostatitis depende de la causa. Si una infeccin bacteriana es la causa, puede ser tratada con antibiticos. En los casos de prostatitis bacteriana crnica, puede ser necesario el uso de antibiticos durante un mximo de 1 mes. Su mdico puede indicarle que tome baos de asiento para Engineer, materials. Un bao de asiento es un bao de agua caliente en la que las caderas y las nalgas estn bajo el agua.  INSTRUCCIONES PARA EL CUIDADO EN EL HOGAR   Tome todos los medicamentos segn le indic su mdico.  Tome los baos de asiento segn le indic su mdico. SOLICITE ATENCIN MDICA SI:   Los sntomas empeoran en lugar de mejorar.  Tiene fiebre. SOLICITE ATENCIN MDICA DE INMEDIATO SI:   Siente escalofros.  Siente nuseas o vomita.  Se siente mareado o  sufre un desmayo.  Nota que no puede orinar.  Tiene un nivel moderado o grande de cetonas en la [place]. Document Released: 02/16/2005 Document Revised: 08/01/2011 [facility] Patient Information 2013 [place], [place].    Meds ordered this encounter  Medications  . DISCONTD: ciprofloxacin (CIPRO) 500 MG tablet    Sig: Take 1 tablet (500 mg total) by mouth 2 (two) times daily.    Dispense:  8 tablet    Refill:  0  . ciprofloxacin (CIPRO) 500 MG tablet    Sig: Take 1 tablet (500 mg total) by mouth 2 (two) times daily.    Dispense:  8 tablet    Refill:  0  initial rx  Printed, voided, sent rx to pharmacy.

## 2012-12-04 ENCOUNTER — Ambulatory Visit: Payer: BC Managed Care – PPO

## 2012-12-04 ENCOUNTER — Ambulatory Visit (INDEPENDENT_AMBULATORY_CARE_PROVIDER_SITE_OTHER): Payer: BC Managed Care – PPO | Admitting: Family Medicine

## 2012-12-04 VITALS — BP 144/92 | HR 80 | Temp 98.2°F | Resp 18 | Ht 66.0 in | Wt 180.0 lb

## 2012-12-04 DIAGNOSIS — Z609 Problem related to social environment, unspecified: Secondary | ICD-10-CM

## 2012-12-04 DIAGNOSIS — Z789 Other specified health status: Secondary | ICD-10-CM

## 2012-12-04 DIAGNOSIS — M62830 Muscle spasm of back: Secondary | ICD-10-CM

## 2012-12-04 DIAGNOSIS — M5430 Sciatica, unspecified side: Secondary | ICD-10-CM

## 2012-12-04 DIAGNOSIS — M5440 Lumbago with sciatica, unspecified side: Secondary | ICD-10-CM

## 2012-12-04 DIAGNOSIS — M79609 Pain in unspecified limb: Secondary | ICD-10-CM

## 2012-12-04 MED ORDER — CYCLOBENZAPRINE HCL 5 MG PO TABS: ORAL_TABLET | ORAL | Status: DC

## 2012-12-04 MED ORDER — MELOXICAM 7.5 MG PO TABS: 7.5000 mg | ORAL_TABLET | Freq: Every day | ORAL | Status: DC

## 2012-12-04 NOTE — Patient Instructions (Addendum)
Flexeril cada 8 horas si necesario, mobic un vez cada dia por dolor. regrese in 1 semana  - mas temprano si peor.   Citica  (Sciatica)  La citica es Chief Technology Officer, debilidad, entumecimiento u hormigueo a lo largo del nervio citico. El nervio comienza en la zona inferior de la espalda y desciende por la parte posterior de cada pierna. El nervio controla los msculos de la parte inferior de la pierna y de la zona posterior de la rodilla, y transmite la sensibilidad a la parte posterior del muslo, la pierna y la planta del pie. La citica es un sntoma de otras afecciones mdicas. Por ejemplo, un dao a los nervios o algunas enfermedades como un disco herniado o un espoln seo en la columna vertebral, podran daarle o presionar en el nervio citico. Esto causa dolor, debilidad y otras sensaciones normalmente asociadas con la citica. Generalmente la citica afecta slo un lado del cuerpo. CAUSAS   Disco herniado o desplazado.  Enfermedad degenerativa del disco.  Un sndrome doloroso que compromete un msculo angosto de los glteos (sndrome piriforme).  Lesin o fractura plvica.  Embarazo.  Tumor (casos raros). SNTOMAS  Los sntomas pueden variar de leves a muy graves. Por lo general, los sntomas descienden desde la zona lumbar a las nalgas y la parte posterior de la pierna. Ellos son:   Hormigueo leve o dolor sordo en la parte inferior de la espalda, la pierna o la cadera.  Adormecimiento en la parte posterior de la pantorrilla o la planta del pie.  Sensacin de [employer] zona lumbar, la pierna o la cadera.  Dolor agudo en la zona inferior de la espalda, la pierna o la cadera.  Debilidad en las piernas.  Dolor de espalda intenso que [employer] movimientos. Los sntomas pueden empeorar al toser, Engineering geologist, rer o estar sentado o parado durante [employer]. Adems, el sobrepeso puede empeorar los sntomas.  DIAGNSTICO  Su mdico le har un examen fsico para buscar los  sntomas comunes de la citica. Le pedir que haga algunos movimientos o actividades que activaran el dolor del nervio citico. Para encontrar las causas de la citica podr indicarle otros estudios. Estos pueden ser:   Anlisis de [place].  Radiografas.  Pruebas de diagnstico por imgenes, como resonancia magntica o tomografa computada. TRATAMIENTO  El tratamiento se dirige a las causas de la citica. A veces, el tratamiento no es necesario, y Chief Technology Officer y Environmental health practitioner desaparecen por s mismos. Si necesita tratamiento, su mdico puede sugerir:   Medicamentos de venta libre para Engineer, materials.  Medicamentos recetados, como antiinflamatorios, relajantes musculares o narcticos.  Aplicacin de calor o hielo en la zona del dolor.  Inyecciones de corticoides para disminuir el dolor, la irritacin y la inflamacin alrededor del nervio.  Reduccin de la [employer] perodos de [place].  Ejercicios y estiramiento del abdomen para fortalecer y Scientist, clinical (histocompatibility and immunogenetics) la flexibilidad de la columna vertebral. Su mdico puede sugerirle perder peso si el peso extra empeora el dolor de espalda.  Fisioterapia.  La ciruga para eliminar lo que presiona o pincha el nervio, como un espoln seo o parte de una hernia de disco. INSTRUCCIONES PARA EL CUIDADO EN EL HOGAR   Slo tome medicamentos de venta libre o recetados para Primary school teacher o Environmental health practitioner, segn las indicaciones de su mdico.  Aplique hielo sobre el rea dolorida durante 20 minutos 3-4 veces por da durante los primeras 48-72 horas. Luego intente aplicar calor de la misma manera.  Haga ejercicios, elongue o realice sus actividades habituales, si no le causan ms dolor.  Cumpla con todas las sesiones de fisioterapia, segn le indique su mdico.  Cumpla con todas las visitas de control, segn le indique su mdico.  No use tacones altos o zapatos que no tengan buen apoyo.  Verifique que el colchn no sea muy blando. Un colchn firme Building surveyor y las [place]. SOLICITE ATENCIN MDICA DE INMEDIATO SI:   Pierde el control de la vejiga o del intestino (incontinencia).  Aumenta la debilidad en la zona inferior de la espalda, la pelvis, las nalgas o las piernas.  Siente irritacin o inflamacin en la espalda.  Tiene sensacin de ardor al [employer].  El dolor empeora cuando se acuesta o lo despierta por la noche.  El dolor es peor del que experiment en el pasado.  Dura ms de 4 semanas.  Pierde peso sin motivo de [place] sbita. ASEGRESE DE QUE:   Comprende estas instrucciones.  Controlar su enfermedad.  Solicitar ayuda de inmediato si no mejora o si empeora. Document Released: 05/09/2005 Document Revised: 11/08/2011 [facility] Patient Information 2014 [place], [place].

## 2012-12-04 NOTE — Progress Notes (Signed)
Subjective:    Patient ID: Danny Walker, male    DOB: 08/26/1945, 67 y.o.   MRN: [id number]  HPI Danny Walker is a 67 y.o. male  Language barrier - spanish spoken.  Pain in lower back/buttocks for past 7 days - pain radiates down R leg.  NKI/fall. No prior similar sxs.  No bowel or bladder incontinence, no saddle anesthesia, no lower extremity weakness, just rdaiating pain at times. 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 Sore first thing in the morning. Some soreness at bedtime - sleeping 5-6 hours d/t pain.   Taking flomax for prostate - helps urine flow. No new urinary sx's.    Tx: asprin.    Review of Systems  Gastrointestinal: Negative for abdominal pain.  Genitourinary: Negative for difficulty urinating.  Musculoskeletal: Positive for myalgias and back pain (minimal lower with spasm/tight on lower left. ). Negative for joint swelling and gait problem (only stiff in am or after long time standing. ).  Skin: Negative for color change and rash.  Neurological: Negative for weakness.       No le weakness. No bowel/bladder incontinence, no saddle anesthesia.   As above.     Objective:   Physical Exam  Constitutional: He appears well-developed and well-nourished.  HENT:  Head: Normocephalic and atraumatic.  Neck: Normal range of motion.  Pulmonary/Chest: Effort normal.  Abdominal: Soft. There is no tenderness.  Musculoskeletal: He exhibits tenderness.       Right hip: He exhibits normal range of motion and normal strength.       Lumbar back: He exhibits tenderness (over R sciatic notch. ) and spasm. He exhibits normal range of motion (from, but pain with R lateral flexion, rotation. ) and no bony tenderness.  Neurological: He is alert. He has normal strength. No sensory deficit. He displays no Babinski's sign on the right side. He displays no Babinski's sign on the left side.  Reflex Scores:      Patellar reflexes are 1+ on the right side and 1+ on  the left side.      Achilles reflexes are 1+ on the right side and 1+ on the left side. Able to heel and toe walk without difficulty. Pain down leg with SLR on R at 170-180 degrees.   Skin: Skin is warm and dry.  Psychiatric: He has a normal mood and affect. His behavior is normal.     [facility] reading (PRIMARY) by  Dr. Neva Seat: LS spine: DDD, decreased disc space L4-5, dextroscoliosis.       Assessment & Plan:  Danny Walker is a 67 y.o. male Acute back pain with sciatica, unspecified laterality - Plan: DG Lumbar Spine 2-3 Views, meloxicam (MOBIC) 7.5 MG tablet  Pain in limb - Plan: DG Lumbar Spine 2-3 Views  Language barrier  Muscle spasm of back - Plan: cyclobenzaprine (FLEXERIL) 5 MG tablet  Suspected R sided sciatica, without weakness. Underlying DDD, possibel HNP. Trail of mobic QD, flexeril up to every 8 hours - SED, and recheck in next 1 week. H/o below. Discussed in spanish, understanding expressed. rtc precautions given.   Meds ordered this encounter  Medications  . meloxicam (MOBIC) 7.5 MG tablet    Sig: Take 1 tablet (7.5 mg total) by mouth daily.    Dispense:  30 tablet    Refill:  0  . cyclobenzaprine (FLEXERIL) 5 MG tablet    Sig: 1 pill by mouth up to every 8 hours as needed.  Start with one pill by mouth each bedtime as needed due to sedation    Dispense:  15 tablet    Refill:  0   Patient Instructions  Flexeril cada 8 horas si necesario, mobic un vez cada dia por dolor. regrese in 1 semana  - mas temprano si peor.   Citica  (Sciatica)  La citica es Chief Technology Officer, debilidad, entumecimiento u hormigueo a lo largo del nervio citico. El nervio comienza en la zona inferior de la espalda y desciende por la parte posterior de cada pierna. El nervio controla los msculos de la parte inferior de la pierna y de la zona posterior de la rodilla, y transmite la sensibilidad a la parte posterior del muslo, la pierna y la planta del pie. La citica es un sntoma de otras  afecciones mdicas. Por ejemplo, un dao a los nervios o algunas enfermedades como un disco herniado o un espoln seo en la columna vertebral, podran daarle o presionar en el nervio citico. Esto causa dolor, debilidad y otras sensaciones normalmente asociadas con la citica. Generalmente la citica afecta slo un lado del cuerpo. CAUSAS   Disco herniado o desplazado.  Enfermedad degenerativa del disco.  Un sndrome doloroso que compromete un msculo angosto de los glteos (sndrome piriforme).  Lesin o fractura plvica.  Embarazo.  Tumor (casos raros). SNTOMAS  Los sntomas pueden variar de leves a muy graves. Por lo general, los sntomas descienden desde la zona lumbar a las nalgas y la parte posterior de la pierna. Ellos son:   Hormigueo leve o dolor sordo en la parte inferior de la espalda, la pierna o la cadera.  Adormecimiento en la parte posterior de la pantorrilla o la planta del pie.  Sensacin de [employer] zona lumbar, la pierna o la cadera.  Dolor agudo en la zona inferior de la espalda, la pierna o la cadera.  Debilidad en las piernas.  Dolor de espalda intenso que [employer] movimientos. Los sntomas pueden empeorar al toser, Engineering geologist, rer o estar sentado o parado durante [employer]. Adems, el sobrepeso puede empeorar los sntomas.  DIAGNSTICO  Su mdico le har un examen fsico para buscar los sntomas comunes de la citica. Le pedir que haga algunos movimientos o actividades que activaran el dolor del nervio citico. Para encontrar las causas de la citica podr indicarle otros estudios. Estos pueden ser:   Anlisis de [place].  Radiografas.  Pruebas de diagnstico por imgenes, como resonancia magntica o tomografa computada. TRATAMIENTO  El tratamiento se dirige a las causas de la citica. A veces, el tratamiento no es necesario, y Chief Technology Officer y Environmental health practitioner desaparecen por s mismos. Si necesita tratamiento, su mdico puede sugerir:    Medicamentos de venta libre para Engineer, materials.  Medicamentos recetados, como antiinflamatorios, relajantes musculares o narcticos.  Aplicacin de calor o hielo en la zona del dolor.  Inyecciones de corticoides para disminuir el dolor, la irritacin y la inflamacin alrededor del nervio.  Reduccin de la [employer] perodos de [place].  Ejercicios y estiramiento del abdomen para fortalecer y Scientist, clinical (histocompatibility and immunogenetics) la flexibilidad de la columna vertebral. Su mdico puede sugerirle perder peso si el peso extra empeora el dolor de espalda.  Fisioterapia.  La ciruga para eliminar lo que presiona o pincha el nervio, como un espoln seo o parte de una hernia de disco. INSTRUCCIONES PARA EL CUIDADO EN EL HOGAR   Slo tome medicamentos de venta libre o recetados para Primary school teacher o Environmental health practitioner, segn  las indicaciones de su mdico.  Aplique hielo sobre el rea dolorida durante 20 minutos 3-4 veces por [employer] primeras 48-72 horas. Luego intente aplicar calor de la misma manera.  Haga ejercicios, elongue o realice sus actividades habituales, si no le causan ms dolor.  Cumpla con todas las sesiones de fisioterapia, segn le indique su mdico.  Cumpla con todas las visitas de control, segn le indique su mdico.  No use tacones altos o zapatos que no tengan buen apoyo.  Verifique que el colchn no sea muy blando. Un colchn firme Engineer, materials y las [place]. SOLICITE ATENCIN MDICA DE INMEDIATO SI:   Pierde el control de la vejiga o del intestino (incontinencia).  Aumenta la debilidad en la zona inferior de la espalda, la pelvis, las nalgas o las piernas.  Siente irritacin o inflamacin en la espalda.  Tiene sensacin de ardor al [employer].  El dolor empeora cuando se acuesta o lo despierta por la noche.  El dolor es peor del que experiment en el pasado.  Dura ms de 4 semanas.  Pierde peso sin motivo de [place] sbita. ASEGRESE DE QUE:   Comprende estas  instrucciones.  Controlar su enfermedad.  Solicitar ayuda de inmediato si no mejora o si empeora. Document Released: 05/09/2005 Document Revised: 11/08/2011 [facility] Patient Information 2014 [place], [place].

## 2013-01-01 ENCOUNTER — Ambulatory Visit (INDEPENDENT_AMBULATORY_CARE_PROVIDER_SITE_OTHER): Payer: BC Managed Care – PPO | Admitting: Family Medicine

## 2013-01-01 ENCOUNTER — Ambulatory Visit: Payer: BC Managed Care – PPO

## 2013-01-01 ENCOUNTER — Other Ambulatory Visit: Payer: Self-pay | Admitting: Family Medicine

## 2013-01-01 VITALS — BP 166/90 | HR 73 | Temp 98.3°F | Resp 16 | Ht 67.0 in | Wt 181.0 lb

## 2013-01-01 DIAGNOSIS — N419 Inflammatory disease of prostate, unspecified: Secondary | ICD-10-CM

## 2013-01-01 DIAGNOSIS — K429 Umbilical hernia without obstruction or gangrene: Secondary | ICD-10-CM

## 2013-01-01 DIAGNOSIS — M5380 Other specified dorsopathies, site unspecified: Secondary | ICD-10-CM

## 2013-01-01 DIAGNOSIS — M545 Low back pain, unspecified: Secondary | ICD-10-CM

## 2013-01-01 DIAGNOSIS — N400 Enlarged prostate without lower urinary tract symptoms: Secondary | ICD-10-CM

## 2013-01-01 DIAGNOSIS — M62830 Muscle spasm of back: Secondary | ICD-10-CM

## 2013-01-01 DIAGNOSIS — R109 Unspecified abdominal pain: Secondary | ICD-10-CM

## 2013-01-01 LAB — POCT URINALYSIS DIPSTICK
Bilirubin, UA: NEGATIVE
Glucose, UA: NEGATIVE
Ketones, UA: NEGATIVE
Leukocytes, UA: NEGATIVE
Nitrite, UA: NEGATIVE
Protein, UA: NEGATIVE
Spec Grav, UA: 1.025
Urobilinogen, UA: 0.2
pH, UA: 5

## 2013-01-01 LAB — BASIC METABOLIC PANEL
BUN: 20 mg/dL (ref 6–23)
CO2: 25 mEq/L (ref 19–32)
Calcium: 9.5 mg/dL (ref 8.4–10.5)
Chloride: 106 mEq/L (ref 96–112)
Creat: 0.77 mg/dL (ref 0.50–1.35)
Glucose, Bld: 105 mg/dL — ABNORMAL HIGH (ref 70–99)
Potassium: 4 mEq/L (ref 3.5–5.3)
Sodium: 140 mEq/L (ref 135–145)

## 2013-01-01 LAB — POCT UA - MICROSCOPIC ONLY
Bacteria, U Microscopic: NEGATIVE
Casts, Ur, LPF, POC: NEGATIVE
Crystals, Ur, HPF, POC: NEGATIVE
Epithelial cells, urine per micros: NEGATIVE
Mucus, UA: NEGATIVE
RBC, urine, microscopic: NEGATIVE
WBC, Ur, HPF, POC: NEGATIVE
Yeast, UA: NEGATIVE

## 2013-01-01 LAB — POCT CBC
Granulocyte percent: 69.3 %G (ref 37–80)
HCT, POC: 46.1 % (ref 43.5–53.7)
Hemoglobin: 14.5 g/dL (ref 14.1–18.1)
Lymph, poc: 1.8 (ref 0.6–3.4)
MCH, POC: 30.5 pg (ref 27–31.2)
MCHC: 31.5 g/dL — AB (ref 31.8–35.4)
MCV: 97.1 fL — AB (ref 80–97)
MID (cbc): 0.4 (ref 0–0.9)
MPV: 7.3 fL (ref 0–99.8)
POC Granulocyte: 5 (ref 2–6.9)
POC LYMPH PERCENT: 24.6 %L (ref 10–50)
POC MID %: 6.1 %M (ref 0–12)
Platelet Count, POC: 300 10*3/uL (ref 142–424)
RBC: 4.75 M/uL (ref 4.69–6.13)
RDW, POC: 15 %
WBC: 7.2 10*3/uL (ref 4.6–10.2)

## 2013-01-01 LAB — PSA: PSA: 8.67 ng/mL — ABNORMAL HIGH (ref <=4.00)

## 2013-01-01 MED ORDER — TAMSULOSIN HCL 0.4 MG PO CAPS: 0.4000 mg | ORAL_CAPSULE | Freq: Every day | ORAL | Status: DC

## 2013-01-01 MED ORDER — CYCLOBENZAPRINE HCL 5 MG PO TABS: ORAL_TABLET | ORAL | Status: DC

## 2013-01-01 NOTE — Progress Notes (Signed)
Subjective:    Patient ID: Danny Walker, male    DOB: 11-14-1945, 67 y.o.   MRN: [id number]  HPI Danny Walker is a 67 y.o. male  Language barrier - spanish spoken.   Seen about one month ago with suspected R sided sciatica, without weakness. Underlying DDD, possibel HNP. Trail of mobic QD, flexeril up to every 8 hours at that time.  No further leg pain, back pain only upper now.   No leg radiation, no weakness.   Here with abdominal pain today not as much pain as it is a slight irritation.  treated for prostatitis in 2/14.  current sx's x 1 week. Feels heavy. No fever, no n/v. Nocturia x 2 last night, but same as in past. Smaller amounts of urine at a time for past 7 days, no dysuria, no blood. Does feel some soreness into back past 1 week.   Later in exam - stated he fell at work 1 week ago? Difficult history - obtained interpreter at this point. Fell 1 week ago - at work - able to get up and kept working without difficulty.  Noticed 3-4 days later soreness in R back.  Sore few days later in abdomen - noticed with lifting. No known hx of hernia. No diarrhea,  Normal BM's. Standing at work - 10-12 hours.    Prescribed flomax in past  - someone took them from his vehicle - about a week ago. Feels like the nocturia started after he had stopped the flomax, not necessarily at time of fall of at work.   Review of Systems  Constitutional: Negative for fever and chills.  Gastrointestinal: Positive for abdominal pain. Negative for nausea and vomiting.  Genitourinary: Positive for difficulty urinating (small amount at night - nocturia x 2 each night.  ). Negative for dysuria, urgency, frequency, hematuria and decreased urine volume.  Musculoskeletal: Positive for back pain.  Skin: Negative for rash.         Objective:   Physical Exam  Vitals reviewed. Constitutional: He is oriented to person, place, and time. He appears well-developed and well-nourished. No distress.  HENT:  Head:  Normocephalic and atraumatic.  Neck: Normal range of motion.  Cardiovascular: Normal heart sounds and intact distal pulses.   Pulmonary/Chest: Effort normal.  Abdominal: Soft. Normal appearance and bowel sounds are normal. He exhibits no distension. There is tenderness (mild suprapubic only.) in the suprapubic area. There is no rigidity, no rebound, no guarding and no CVA tenderness (paraspinals only). A hernia is present. Hernia confirmed positive in the ventral area (unmbilcal - easily reducible, nontender, per pt - there for years. ). Hernia confirmed negative in the right inguinal area and confirmed negative in the left inguinal area.  Musculoskeletal: He exhibits tenderness.       Lumbar back: He exhibits tenderness (minamal over R paraspinal spasm. ) and spasm. He exhibits normal range of motion and no bony tenderness.       Back:  Neurological: He is alert and oriented to person, place, and time. He has normal strength. No sensory deficit.  Reflex Scores:      Patellar reflexes are 2+ on the right side and 2+ on the left side.      Achilles reflexes are 2+ on the right side and 2+ on the left side. Able to heel and toe walk without difficulty.  Skin: Skin is warm and dry. No rash noted.  Psychiatric: He has a normal mood and affect. His behavior is normal.  Results for orders placed in visit on 01/01/13  POCT CBC      Result Value Range   WBC 7.2  4.6 - 10.2 K/uL   Lymph, poc 1.8  0.6 - 3.4   POC LYMPH PERCENT 24.6  10 - 50 %L   MID (cbc) 0.4  0 - 0.9   POC MID % 6.1  0 - 12 %M   POC Granulocyte 5.0  2 - 6.9   Granulocyte percent 69.3  37 - 80 %G   RBC 4.75  4.69 - 6.13 M/uL   Hemoglobin 14.5  14.1 - 18.1 g/dL   HCT, POC 09.6  04.5 - 53.7 %   MCV 97.1 (*) 80 - 97 fL   MCH, POC 30.5  27 - 31.2 pg   MCHC 31.5 (*) 31.8 - 35.4 g/dL   RDW, POC 40.9     Platelet Count, POC 300  142 - 424 K/uL   MPV 7.3  0 - 99.8 fL  POCT UA - MICROSCOPIC ONLY      Result Value Range   WBC, Ur,  HPF, POC neg     RBC, urine, microscopic neg     Bacteria, U Microscopic neg     Mucus, UA neg     Epithelial cells, urine per micros neg     Crystals, Ur, HPF, POC neg     Casts, Ur, LPF, POC neg     Yeast, UA neg    POCT URINALYSIS DIPSTICK      Result Value Range   Color, UA yellow     Clarity, UA clear     Glucose, UA neg     Bilirubin, UA neg     Ketones, UA neg     Spec Grav, UA 1.025     Blood, UA trace     pH, UA 5.0     Protein, UA neg     Urobilinogen, UA 0.2     Nitrite, UA neg     Leukocytes, UA Negative     [facility] reading (PRIMARY) by  Dr. Neva Seat: LS spine: DDD with ? Bridging osteophyte L 2-3 on L, dextroscoliosis.     Assessment & Plan:  Danny Walker is a 67 y.o. male   Abdominal  pain, other specified site - Plan: POCT CBC, Basic metabolic panel pending.  abd wall strain? Sx care, refer to surgery for umbilical hernia, but by hx - longstanding, and hernia ER precautions discussed.   Low back pain - Plan: POCT CBC, POCT UA - Microscopic Only, POCT urinalysis dipstick, Muscle spasm of back - Plan: cyclobenzaprine (FLEXERIL) 5 MG tablet, strain - noticed few days after fall at work.  Underlying degenerative dz, with possible flair.   Prostatitis - Plan: PSA, Basic metabolic panel - hx of proatatitis, does not appear to have infection now, more likely BPH as had been doing well on meds. PSA pending, plan to recehck in next 4-5 days if not improving or in 1 week if stable.   BPH (benign prostatic hyperplasia) - Plan: tamsulosin (FLOMAX) 0.4 MG CAPS capsule - plan as above.   Spanish spoken and interpreter used. Understanding expressed.   Meds ordered this encounter  Medications  . cyclobenzaprine (FLEXERIL) 5 MG tablet    Sig: 1 pill by mouth up to every 8 hours as needed. Start with one pill by mouth each bedtime as needed due to sedation    Dispense:  15 tablet    Refill:  0  Label in spanish.  . tamsulosin (FLOMAX) 0.4 MG CAPS capsule    Sig: Take 1  capsule (0.4 mg total) by mouth daily.    Dispense:  30 capsule    Refill:  3   Patient Instructions  Te voy a referier a un especialista de ombligo hernia. Empieza el medicamento relajante muscular, esta medicina le pueda dar sueno, pon algo caliente or frio en la espalda. empieza otravez la medicinia de la prostata. Vueleve en 4 or 5 dia si no mejoras; antes si estas peor.

## 2013-01-01 NOTE — Patient Instructions (Signed)
Te voy a referier a Music therapist de ombligo hernia. Empieza el medicamento relajante muscular, esta medicina le pueda dar sueno, pon algo caliente or frio en la espalda. empieza otravez la medicinia de la prostata. Vueleve en 4 or 5 dia si no mejoras; antes si estas peor.

## 2013-01-10 ENCOUNTER — Ambulatory Visit (INDEPENDENT_AMBULATORY_CARE_PROVIDER_SITE_OTHER): Payer: Self-pay | Admitting: General Surgery

## 2013-01-14 ENCOUNTER — Encounter: Payer: Self-pay | Admitting: Family Medicine

## 2013-01-15 ENCOUNTER — Telehealth: Payer: Self-pay | Admitting: *Deleted

## 2013-01-15 NOTE — Telephone Encounter (Signed)
Pt called back and was given lab results by Gunnar Fusi.  Pt will return for recheck.

## 2013-01-17 ENCOUNTER — Ambulatory Visit (INDEPENDENT_AMBULATORY_CARE_PROVIDER_SITE_OTHER): Payer: Self-pay | Admitting: General Surgery

## 2013-01-30 ENCOUNTER — Encounter (INDEPENDENT_AMBULATORY_CARE_PROVIDER_SITE_OTHER): Payer: Self-pay | Admitting: General Surgery

## 2015-12-21 ENCOUNTER — Ambulatory Visit: Payer: Self-pay | Admitting: Surgery

## 2016-02-02 ENCOUNTER — Encounter (HOSPITAL_BASED_OUTPATIENT_CLINIC_OR_DEPARTMENT_OTHER): Payer: Self-pay | Admitting: *Deleted

## 2016-02-04 ENCOUNTER — Encounter (HOSPITAL_BASED_OUTPATIENT_CLINIC_OR_DEPARTMENT_OTHER): Payer: Self-pay | Admitting: Surgery

## 2016-02-04 DIAGNOSIS — K429 Umbilical hernia without obstruction or gangrene: Secondary | ICD-10-CM | POA: Diagnosis present

## 2016-02-04 NOTE — H&P (Signed)
General Surgery [facility]  Danny Walker DOB: 1945-11-21 Married / Language: Spanish / Race: White Male  History of Present Illness   The patient is a 70 year old male who presents with an umbilical hernia.  Patient is referred by Dr. Haskel Schroeder at [facility] urology for evaluation of umbilical hernia. Patient's primary care physician is Dr. Merri Ray. Patient has a long-standing umbilical hernia. This is gradually increased in size. He is having some discomfort in the periumbilical area just to the right of midline. He has had no other prior abdominal surgery. He now presents to discuss repair. Patient only speaks [place]. I provided him with written literature in Spanish regarding hernia repair. We also utilized his daughter by telephone to translate during the course of my interview with him today.  Other Problems Back Pain Bladder Problems High blood pressure  Past Surgical History Appendectomy  Allergies No Known Drug Allergies07/31/2017  Medication History  Cyclobenzaprine HCl (10MG  Tablet, Oral) Active. Hydrocodone-Acetaminophen (5-325MG  Tablet, Oral) Active. Medications Reconciled  Social History Alcohol use Occasional alcohol use. Caffeine use Coffee. No drug use Tobacco use Never smoker.  Family History  Family history unknown First Degree Relatives  Review of Systems  General Not Present- Appetite Loss, Chills, Fatigue, Fever, Night Sweats, Weight Gain and Weight Loss. Skin Not Present- Change in Wart/Mole, Dryness, Hives, Jaundice, New Lesions, Non-Healing Wounds, Rash and Ulcer. HEENT Not Present- Earache, Hearing Loss, Hoarseness, Nose Bleed, Oral Ulcers, Ringing in the Ears, Seasonal Allergies, Sinus Pain, Sore Throat, Visual Disturbances, Wears glasses/contact lenses and Yellow Eyes. Respiratory Not Present- Bloody sputum, Chronic Cough, Difficulty Breathing, Snoring and Wheezing. Breast Not Present-  Breast Mass, Breast Pain, Nipple Discharge and Skin Changes. Cardiovascular Not Present- Chest Pain, Difficulty Breathing Lying Down, Leg Cramps, Palpitations, Rapid Heart Rate, Shortness of Breath and Swelling of Extremities. Gastrointestinal Present- Constipation. Not Present- Abdominal Pain, Bloating, Bloody Stool, Change in Bowel Habits, Chronic diarrhea, Difficulty Swallowing, Excessive gas, Gets full quickly at meals, Hemorrhoids, Indigestion, Nausea, Rectal Pain and Vomiting. Male Genitourinary Present- Change in Urinary Stream and Impotence. Not Present- Blood in Urine, Frequency, Nocturia, Painful Urination, Urgency and Urine Leakage.  Vitals Weight: 176.4 lb Height: 66in Body Surface Area: 1.9 m Body Mass Index: 28.47 kg/m  Temp.: 97.22F(Temporal)  Pulse: 46 (Regular)  BP: 130/70 (Sitting, Left Arm, Standard)  Physical Exam The physical exam findings are as follows: Note:General - appears comfortable, no distress; not diaphorectic  HEENT - normocephalic; sclerae clear, gaze conjugate; mucous membranes moist, dentition good; voice normal  Neck - symmetric on extension; no palpable anterior or posterior cervical adenopathy; no palpable masses in the thyroid bed  Chest - clear bilaterally without rhonchi, rales, or wheeze  Cor - regular rhythm with normal rate; no significant murmur  Abd - soft without distension; obvious umbilical hernia, moderate in size, reducible in a recumbent position, fascial defect measuring approximately 2-2.5 cm in diameter; no other palpable masses; no other surgical incisions.  Ext - non-tender without significant edema or lymphedema  Neuro - grossly intact; no tremor   Assessment & Plan  UMBILICAL HERNIA WITHOUT OBSTRUCTION OR GANGRENE (K42.9)  Pt Education - Pamphlet Given - Hernia Surgery: discussed with patient and provided information.  Patient presents with a mildly symptomatic umbilical hernia. Patient is provided with  written literature in Spanish to review at home. During the course of today's interview, his daughter was on the telephone and acted as Optometrist.  I recommended proceeding with umbilical hernia repair  with mesh patch as an outpatient surgical procedure. We discussed risk and benefits. We discussed restrictions on his activities following the procedure. Patient states that he understands and wishes to proceed with surgery in the near future.  The risks and benefits of the procedure have been discussed at length with the patient. The patient understands the proposed procedure, potential alternative treatments, and the course of recovery to be expected. All of the patient's questions have been answered at this time. The patient wishes to proceed with surgery.  Danny Regal, MD, [facility] Office: [phone]

## 2016-02-05 ENCOUNTER — Ambulatory Visit (HOSPITAL_BASED_OUTPATIENT_CLINIC_OR_DEPARTMENT_OTHER)
Admission: RE | Admit: 2016-02-05 | Discharge: 2016-02-05 | Disposition: A | Discharge status: Home / Self Care | Payer: Medicaid Other | Source: Ambulatory Visit | Attending: Surgery | Admitting: Surgery

## 2016-02-05 ENCOUNTER — Ambulatory Visit (HOSPITAL_BASED_OUTPATIENT_CLINIC_OR_DEPARTMENT_OTHER): Payer: Medicaid Other | Admitting: Anesthesiology

## 2016-02-05 ENCOUNTER — Encounter (HOSPITAL_BASED_OUTPATIENT_CLINIC_OR_DEPARTMENT_OTHER): Payer: Self-pay | Admitting: *Deleted

## 2016-02-05 ENCOUNTER — Encounter (HOSPITAL_BASED_OUTPATIENT_CLINIC_OR_DEPARTMENT_OTHER)
Admission: RE | Disposition: A | Discharge status: Home / Self Care | Payer: Self-pay | Source: Ambulatory Visit | Attending: Surgery

## 2016-02-05 DIAGNOSIS — K429 Umbilical hernia without obstruction or gangrene: Secondary | ICD-10-CM | POA: Diagnosis present

## 2016-02-05 DIAGNOSIS — I10 Essential (primary) hypertension: Secondary | ICD-10-CM | POA: Insufficient documentation

## 2016-02-05 HISTORY — PX: INSERTION OF MESH: SHX5868

## 2016-02-05 HISTORY — DX: Umbilical hernia without obstruction or gangrene: K42.9

## 2016-02-05 HISTORY — PX: UMBILICAL HERNIA REPAIR: SHX196

## 2016-02-05 HISTORY — DX: Nocturia: R35.1

## 2016-02-05 SURGERY — REPAIR, HERNIA, UMBILICAL, ADULT
Anesthesia: General | Site: Abdomen

## 2016-02-05 MED ORDER — CHLORHEXIDINE GLUCONATE CLOTH 2 % EX PADS: 6.0000 | MEDICATED_PAD | Freq: Once | CUTANEOUS | Status: DC

## 2016-02-05 MED ORDER — CEFAZOLIN SODIUM-DEXTROSE 2-4 GM/100ML-% IV SOLN
INTRAVENOUS | Status: AC
  Filled 2016-02-05: qty 100

## 2016-02-05 MED ORDER — CEFAZOLIN SODIUM-DEXTROSE 2-4 GM/100ML-% IV SOLN
2.0000 g | INTRAVENOUS | Status: AC
  Administered 2016-02-05: 2 g via INTRAVENOUS

## 2016-02-05 MED ORDER — GLYCOPYRROLATE 0.2 MG/ML IV SOSY
PREFILLED_SYRINGE | INTRAVENOUS | Status: AC
  Filled 2016-02-05: qty 3

## 2016-02-05 MED ORDER — HYDROCODONE-ACETAMINOPHEN 5-325 MG PO TABS: 1.0000 | ORAL_TABLET | ORAL | 0 refills | Status: DC | PRN

## 2016-02-05 MED ORDER — ONDANSETRON HCL 4 MG/2ML IJ SOLN
INTRAMUSCULAR | Status: DC | PRN
  Administered 2016-02-05: 4 mg via INTRAVENOUS

## 2016-02-05 MED ORDER — BUPIVACAINE-EPINEPHRINE (PF) 0.25% -1:200000 IJ SOLN
INTRAMUSCULAR | Status: AC
  Filled 2016-02-05: qty 30

## 2016-02-05 MED ORDER — DEXAMETHASONE SODIUM PHOSPHATE 10 MG/ML IJ SOLN
INTRAMUSCULAR | Status: AC
  Filled 2016-02-05: qty 1

## 2016-02-05 MED ORDER — LACTATED RINGERS IV SOLN
INTRAVENOUS | Status: DC
  Administered 2016-02-05 (×2): via INTRAVENOUS

## 2016-02-05 MED ORDER — FENTANYL CITRATE (PF) 100 MCG/2ML IJ SOLN
INTRAMUSCULAR | Status: DC | PRN
  Administered 2016-02-05: 100 ug via INTRAVENOUS

## 2016-02-05 MED ORDER — SCOPOLAMINE 1 MG/3DAYS TD PT72
1.0000 | MEDICATED_PATCH | Freq: Once | TRANSDERMAL | Status: DC | PRN
Start: 2016-02-05 — End: 2016-02-05

## 2016-02-05 MED ORDER — MIDAZOLAM HCL 2 MG/2ML IJ SOLN
INTRAMUSCULAR | Status: AC
  Filled 2016-02-05: qty 2

## 2016-02-05 MED ORDER — PROPOFOL 500 MG/50ML IV EMUL
INTRAVENOUS | Status: AC
Start: 2016-02-05 — End: 2016-02-05
  Filled 2016-02-05: qty 50

## 2016-02-05 MED ORDER — PROPOFOL 10 MG/ML IV BOLUS
INTRAVENOUS | Status: DC | PRN
  Administered 2016-02-05: 150 mg via INTRAVENOUS

## 2016-02-05 MED ORDER — MIDAZOLAM HCL 2 MG/2ML IJ SOLN: 1.0000 mg | INTRAMUSCULAR | Status: DC | PRN

## 2016-02-05 MED ORDER — ONDANSETRON HCL 4 MG/2ML IJ SOLN: 4.0000 mg | Freq: Once | INTRAMUSCULAR | Status: DC | PRN

## 2016-02-05 MED ORDER — LIDOCAINE 2% (20 MG/ML) 5 ML SYRINGE
INTRAMUSCULAR | Status: AC
Start: 2016-02-05 — End: 2016-02-05
  Filled 2016-02-05: qty 5

## 2016-02-05 MED ORDER — LIDOCAINE HCL (CARDIAC) 20 MG/ML IV SOLN
INTRAVENOUS | Status: DC | PRN
  Administered 2016-02-05: 50 mg via INTRAVENOUS

## 2016-02-05 MED ORDER — ROCURONIUM BROMIDE 10 MG/ML (PF) SYRINGE
PREFILLED_SYRINGE | INTRAVENOUS | Status: AC
  Filled 2016-02-05: qty 10

## 2016-02-05 MED ORDER — EPHEDRINE SULFATE 50 MG/ML IJ SOLN
INTRAMUSCULAR | Status: DC | PRN
  Administered 2016-02-05: 10 mg via INTRAVENOUS

## 2016-02-05 MED ORDER — FENTANYL CITRATE (PF) 100 MCG/2ML IJ SOLN: 50.0000 ug | INTRAMUSCULAR | Status: DC | PRN

## 2016-02-05 MED ORDER — ONDANSETRON HCL 4 MG/2ML IJ SOLN
INTRAMUSCULAR | Status: AC
  Filled 2016-02-05: qty 2

## 2016-02-05 MED ORDER — DEXAMETHASONE SODIUM PHOSPHATE 4 MG/ML IJ SOLN
INTRAMUSCULAR | Status: DC | PRN
  Administered 2016-02-05: 10 mg via INTRAVENOUS

## 2016-02-05 MED ORDER — GLYCOPYRROLATE 0.2 MG/ML IJ SOLN: 0.2000 mg | Freq: Once | INTRAMUSCULAR | Status: DC | PRN

## 2016-02-05 MED ORDER — MEPERIDINE HCL 25 MG/ML IJ SOLN: 6.2500 mg | INTRAMUSCULAR | Status: DC | PRN

## 2016-02-05 MED ORDER — BUPIVACAINE HCL (PF) 0.5 % IJ SOLN
INTRAMUSCULAR | Status: DC | PRN
  Administered 2016-02-05: 20 mL

## 2016-02-05 MED ORDER — FENTANYL CITRATE (PF) 100 MCG/2ML IJ SOLN
INTRAMUSCULAR | Status: AC
  Filled 2016-02-05: qty 2

## 2016-02-05 MED ORDER — BUPIVACAINE HCL (PF) 0.5 % IJ SOLN
INTRAMUSCULAR | Status: AC
  Filled 2016-02-05: qty 30

## 2016-02-05 MED ORDER — HYDROMORPHONE HCL 1 MG/ML IJ SOLN: 0.2500 mg | INTRAMUSCULAR | Status: DC | PRN

## 2016-02-05 MED ORDER — MIDAZOLAM HCL 5 MG/5ML IJ SOLN
INTRAMUSCULAR | Status: DC | PRN
  Administered 2016-02-05: 2 mg via INTRAVENOUS

## 2016-02-05 MED ORDER — SUGAMMADEX SODIUM 200 MG/2ML IV SOLN
INTRAVENOUS | Status: AC
  Filled 2016-02-05: qty 2

## 2016-02-05 SURGICAL SUPPLY — 42 items
APL SKNCLS STERI-STRIP NONHPOA (GAUZE/BANDAGES/DRESSINGS) ×1
BALL CTTN LRG ABS STRL LF (GAUZE/BANDAGES/DRESSINGS) ×1
BENZOIN TINCTURE PRP APPL 2/3 (GAUZE/BANDAGES/DRESSINGS) ×2 IMPLANT
BLADE CLIPPER SURG (BLADE) ×2 IMPLANT
BLADE SURG 15 STRL LF DISP TIS (BLADE) ×1 IMPLANT
BLADE SURG 15 STRL SS (BLADE) ×2
CHLORAPREP W/TINT 26ML (MISCELLANEOUS) ×2 IMPLANT
COTTONBALL LRG STERILE PKG (GAUZE/BANDAGES/DRESSINGS) ×2 IMPLANT
COVER BACK TABLE 60X90IN (DRAPES) ×2 IMPLANT
COVER MAYO STAND STRL (DRAPES) ×2 IMPLANT
DECANTER SPIKE VIAL GLASS SM (MISCELLANEOUS) IMPLANT
DRAPE LAPAROTOMY 100X72 PEDS (DRAPES) ×2 IMPLANT
DRAPE UTILITY XL STRL (DRAPES) ×2 IMPLANT
ELECT REM PT RETURN 9FT ADLT (ELECTROSURGICAL) ×2
ELECTRODE REM PT RTRN 9FT ADLT (ELECTROSURGICAL) ×1 IMPLANT
GAUZE SPONGE 4X4 16PLY XRAY LF (GAUZE/BANDAGES/DRESSINGS) IMPLANT
GLOVE BIOGEL PI IND STRL 7.0 (GLOVE) ×1 IMPLANT
GLOVE BIOGEL PI INDICATOR 7.0 (GLOVE) ×1
GLOVE ECLIPSE 6.5 STRL STRAW (GLOVE) ×2 IMPLANT
GLOVE EXAM NITRILE EXT CUFF MD (GLOVE) ×2 IMPLANT
GLOVE SURG ORTHO 8.0 STRL STRW (GLOVE) ×2 IMPLANT
GOWN STRL REUS W/ TWL LRG LVL3 (GOWN DISPOSABLE) ×1 IMPLANT
GOWN STRL REUS W/ TWL XL LVL3 (GOWN DISPOSABLE) ×1 IMPLANT
GOWN STRL REUS W/TWL LRG LVL3 (GOWN DISPOSABLE) ×1
GOWN STRL REUS W/TWL XL LVL3 (GOWN DISPOSABLE) ×2
MESH VENTRALEX ST 1-7/10 CRC S (Mesh General) ×2 IMPLANT
NEEDLE HYPO 25X1 1.5 SAFETY (NEEDLE) ×2 IMPLANT
NS IRRIG 1000ML POUR BTL (IV SOLUTION) ×2 IMPLANT
PACK BASIN DAY SURGERY FS (CUSTOM PROCEDURE TRAY) ×2 IMPLANT
PENCIL BUTTON HOLSTER BLD 10FT (ELECTRODE) ×2 IMPLANT
SLEEVE SCD COMPRESS KNEE MED (MISCELLANEOUS) ×2 IMPLANT
SPONGE GAUZE 4X4 12PLY STER LF (GAUZE/BANDAGES/DRESSINGS) ×2 IMPLANT
STRIP CLOSURE SKIN 1/2X4 (GAUZE/BANDAGES/DRESSINGS) ×2 IMPLANT
SUT ETHIBOND 0 MO6 C/R (SUTURE) IMPLANT
SUT MNCRL AB 4-0 PS2 18 (SUTURE) ×2 IMPLANT
SUT NOVA 0 T19/GS 22DT (SUTURE) ×2 IMPLANT
SUT VICRYL 3-0 CR8 SH (SUTURE) ×2 IMPLANT
SUT VICRYL 4-0 PS2 18IN ABS (SUTURE) IMPLANT
SYR BULB 3OZ (MISCELLANEOUS) IMPLANT
SYR CONTROL 10ML LL (SYRINGE) ×2 IMPLANT
TOWEL OR 17X24 6PK STRL BLUE (TOWEL DISPOSABLE) ×4 IMPLANT
TOWEL OR NON WOVEN STRL DISP B (DISPOSABLE) IMPLANT

## 2016-02-05 NOTE — Anesthesia Preprocedure Evaluation (Signed)

## 2016-02-05 NOTE — Discharge Instructions (Signed)

## 2016-02-05 NOTE — Brief Op Note (Signed)
02/05/2016  8:21 AM  PATIENT:  Danny Walker  70 y.o. male  PRE-OPERATIVE DIAGNOSIS:  Umbilical hernia  POST-OPERATIVE DIAGNOSIS:  Umbilical hernia  PROCEDURE:  Procedure(s): UMBILICAL HERNIA REPAIR WITH MESH PATCH (N/A) INSERTION OF MESH, UMBILICAL HERNIA (N/A)  SURGEON:  Surgeon(s) and Role:    * Armandina Gemma, MD - Primary  ANESTHESIA:   general  EBL:  Total I/O In: 1000 [I.V.:1000] Out: 6 [Blood:6]  BLOOD ADMINISTERED:none  DRAINS: none   LOCAL MEDICATIONS USED:  MARCAINE     SPECIMEN:  No Specimen  DISPOSITION OF SPECIMEN:  N/A  COUNTS:  YES  TOURNIQUET:  * No tourniquets in log *  DICTATION: .Other Dictation: Dictation Number [id number]  PLAN OF CARE: Discharge to home after PACU  PATIENT DISPOSITION:  PACU - hemodynamically stable.   Delay start of Pharmacological VTE agent (>24hrs) due to surgical blood loss or risk of bleeding: yes  Earnstine Regal, MD, [facility] Office: [phone]

## 2016-02-05 NOTE — Transfer of Care (Signed)
Immediate Anesthesia Transfer of Care Note  Patient: Danny Walker  Procedure(s) Performed: Procedure(s): UMBILICAL HERNIA REPAIR WITH MESH PATCH (N/A) INSERTION OF MESH, UMBILICAL HERNIA (N/A)  Patient Location: PACU  Anesthesia Type:General  Level of Consciousness: sedated  Airway & Oxygen Therapy: Patient Spontanous Breathing and Patient connected to face mask oxygen  Post-op Assessment: Report given to RN and Post -op Vital signs reviewed and stable  Post vital signs: Reviewed and stable  Last Vitals:  Vitals:   02/05/16 0702  BP: (!) 167/84  Pulse: 78  Resp: 20  Temp: 36.7 C    Last Pain:  Vitals:   02/05/16 0702  TempSrc: Oral         Complications: No apparent anesthesia complications

## 2016-02-05 NOTE — Interval H&P Note (Signed)
History and Physical Interval Note:  02/05/2016 7:06 AM  Danny Walker  has presented today for surgery, with the diagnosis of Umbilical hernia.  The various methods of treatment have been discussed with the patient and family. After consideration of risks, benefits and other options for treatment, the patient has consented to    Procedure(s): UMBILICAL HERNIA REPAIR WITH MESH PATCH (N/A) INSERTION OF MESH (N/A) as a surgical intervention .    The patient's history has been reviewed, patient examined, no change in status, stable for surgery.  I have reviewed the patient's chart and labs.  Questions were answered to the patient's satisfaction.    Earnstine Regal, MD, [facility] Office: [place]

## 2016-02-05 NOTE — Anesthesia Postprocedure Evaluation (Signed)
Anesthesia Post Note  Patient: Danny Walker  Procedure(s) Performed: Procedure(s) (LRB): UMBILICAL HERNIA REPAIR WITH MESH PATCH (N/A) INSERTION OF MESH, UMBILICAL HERNIA (N/A)  Patient location during evaluation: PACU Anesthesia Type: General Level of consciousness: awake and alert Pain management: pain level controlled Vital Signs Assessment: post-procedure vital signs reviewed and stable Respiratory status: spontaneous breathing, nonlabored ventilation, respiratory function stable and patient connected to nasal cannula oxygen Cardiovascular status: blood pressure returned to baseline and stable Postop Assessment: no signs of nausea or vomiting Anesthetic complications: no    Last Vitals:  Vitals:   02/05/16 0830 02/05/16 0845  BP: 112/68 118/73  Pulse: 83 83  Resp: 12 13  Temp: 36.2 C     Last Pain:  Vitals:   02/05/16 0830  TempSrc:   PainSc: 0-No pain                 Marieme Mcmackin DAVID

## 2016-02-05 NOTE — Anesthesia Procedure Notes (Signed)
Procedure Name: LMA Insertion Date/Time: 02/05/2016 7:39 AM Performed by: Toula Moos L Pre-anesthesia Checklist: Patient identified, Emergency Drugs available, Suction available, Patient being monitored and Timeout performed Patient Re-evaluated:Patient Re-evaluated prior to inductionOxygen Delivery Method: Circle system utilized Preoxygenation: Pre-oxygenation with 100% oxygen Intubation Type: IV induction Ventilation: Mask ventilation without difficulty LMA: LMA inserted LMA Size: 5.0 Number of attempts: 1 Airway Equipment and Method: Bite block Placement Confirmation: positive ETCO2 Tube secured with: Tape Dental Injury: Teeth and Oropharynx as per pre-operative assessment

## 2016-02-08 ENCOUNTER — Encounter (HOSPITAL_BASED_OUTPATIENT_CLINIC_OR_DEPARTMENT_OTHER): Payer: Self-pay | Admitting: Surgery

## 2016-02-08 NOTE — Op Note (Signed)
[name], [name]               ACCOUNT NO.:  [id number]  MEDICAL RECORD NO.:  [id number]  LOCATION:                                 FACILITY:  PHYSICIAN:  Earnstine Regal, MD      DATE OF BIRTH:  1946-04-03  DATE OF PROCEDURE:  02/05/2016                              OPERATIVE REPORT   PREOPERATIVE DIAGNOSIS:  Umbilical hernia.  POSTOP DIAGNOSIS:  Umbilical hernia.  PROCEDURE:  Repair of umbilical hernia with mesh patch (Bard Ventralex ST 4.3 cm).  SURGEON:  Armandina Gemma, M.D.  ANESTHESIA:  General.  ESTIMATED BLOOD LOSS:  Minimal.  PREPARATION:  ChloraPrep.  COMPLICATIONS:  None.  INDICATIONS:  The patient is a 70 year old male from [place], who is referred by Dr. [name] at [facility] for repair of umbilical hernia.  The patient's primary care physician is Dr. Merri Ray.  The patient has noted a longstanding umbilical hernia, which is gradually increased in size and is causing periumbilical discomfort.  He now comes to Surgery for repair.  BODY OF REPORT:  Procedure was done in OR #5 at the [facility].  The patient was brought to the operating room, placed in supine position on the operating room table.  Following administration of general anesthesia, the patient was prepped and draped in the usual aseptic fashion.  After ascertaining that an adequate level of anesthesia had been achieved, an incision was made transversely below the umbilicus.  Dissection was carried down to the fascia.  Fascial plane was developed.  Hernia sac was dissected away from the umbilical skin.  It is reduced back within the peritoneal cavity.  Preperitoneal space was developed with blunt dissection.  A Bard Ventralex ST 4.3 cm in diameter patch was selected and prepared.  It was inserted into the preperitoneal space and deployed circumferentially.  It was secured to the fascia with interrupted 0 Novafil simple sutures.  The fascial defect was closed  with interrupted 0 Novafil simple sutures.  Local field block was placed in the fascia.  The umbilicus was re-affixed to the abdominal wall with an interrupted 0 Novafil suture.  Subcutaneous tissues were closed with interrupted 3-0 Vicryl sutures.  Skin was anesthetized with local anesthetic.  Skin edges were reapproximated with a running 4-0 Monocryl subcuticular suture.  Wound was washed and dried and benzoin and Steri-Strips were applied.  Sterile dressings were applied.  The patient was awakened from anesthesia and brought to the recovery room.  The patient tolerated the procedure well.   Earnstine Regal, MD, [facility] Office: [phone]   TMG/MEDQ  D:  02/05/2016  T:  02/06/2016  Job:  [id number]  cc:   Dr. [name]. Wendie Agreste, M.D.

## 2017-11-19 ENCOUNTER — Emergency Department (HOSPITAL_COMMUNITY): Payer: Medicare Other

## 2017-11-19 ENCOUNTER — Encounter (HOSPITAL_COMMUNITY): Payer: Self-pay | Admitting: Emergency Medicine

## 2017-11-19 ENCOUNTER — Emergency Department (HOSPITAL_COMMUNITY)
Admission: EM | Admit: 2017-11-19 | Discharge: 2017-11-19 | Disposition: A | Discharge status: Home / Self Care | Payer: Medicare Other | Attending: Emergency Medicine | Admitting: Emergency Medicine

## 2017-11-19 DIAGNOSIS — Z79899 Other long term (current) drug therapy: Secondary | ICD-10-CM | POA: Insufficient documentation

## 2017-11-19 DIAGNOSIS — M79671 Pain in right foot: Secondary | ICD-10-CM | POA: Diagnosis present

## 2017-11-19 DIAGNOSIS — M7731 Calcaneal spur, right foot: Secondary | ICD-10-CM | POA: Insufficient documentation

## 2017-11-19 MED ORDER — NAPROXEN 250 MG PO TABS
375.0000 mg | ORAL_TABLET | Freq: Once | ORAL | Status: AC
Start: 2017-11-19 — End: 2017-11-19
  Administered 2017-11-19: 375 mg via ORAL
  Filled 2017-11-19: qty 2

## 2017-11-19 MED ORDER — DOXYCYCLINE HYCLATE 100 MG PO CAPS: 100.0000 mg | ORAL_CAPSULE | Freq: Two times a day (BID) | ORAL | 0 refills | Status: AC

## 2017-11-19 MED ORDER — NAPROXEN 375 MG PO TABS: 375.0000 mg | ORAL_TABLET | Freq: Two times a day (BID) | ORAL | 0 refills | Status: AC

## 2017-11-19 MED ORDER — BACITRACIN ZINC 500 UNIT/GM EX OINT
TOPICAL_OINTMENT | Freq: Once | CUTANEOUS | Status: AC
  Administered 2017-11-19: 08:00:00 via TOPICAL
  Filled 2017-11-19: qty 0.9

## 2017-11-19 NOTE — ED Triage Notes (Addendum)
Pt states he has had a wound to his right heel for 1 month. Upon assessment wound is small, pt unsure if it was a blister, denies stepping on anything or obtaining any splinters/forgeign objects. No drainage or redness noted to the foot. Pt rates pain 6/10. No medical hx besides hernia. No hx of diabetes or neuropathy. No allergies to medications. Pt states pain with walking and would like some medication for the pain. Pt only speaks spanish.

## 2017-11-19 NOTE — ED Notes (Signed)
Patient verbalizes understanding of discharge instructions. Opportunity for questioning and answers were provided. Armband removed by staff, pt discharged from ED ambulatory.   

## 2017-11-19 NOTE — ED Provider Notes (Signed)
[facility] EMERGENCY DEPARTMENT Provider Note   CSN: [id number] Arrival date & time: 11/19/17  0701     History   Chief Complaint Chief Complaint  Patient presents with  . heel problem    HPI Danny Walker is a 72 y.o. male.  HPI   72 yo M here with R heel pain. Pt reports that he has had aching, sharp right heel pain x 1 month. 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  He tries to wear supportive shoes.  He states that he had gradual onset of a sharp, positional, right heel pain.  He is noticed a small wound at the area.  He is been trying to stay off of it but is on his feet very much for work.  He states the pain is worse with palpation and standing.  Denies any alleviating factors.  Does not improve when he is off of his feet.  Denies any possible foreign bodies or injuries that he can recall.  He is not diabetic.  No fevers or chills.  Denies any redness or drainage.  Past Medical History:  Diagnosis Date  . Nocturia   . Umbilical hernia     Patient Active Problem List   Diagnosis Date Noted  . Umbilical hernia 40/98/1191    Past Surgical History:  Procedure Laterality Date  . CHOLECYSTECTOMY    . COLON SURGERY    . INSERTION OF MESH N/A 02/05/2016   Procedure: INSERTION OF MESH, UMBILICAL HERNIA;  Surgeon: Armandina Gemma, MD;  Location: [facility];  Service: General;  Laterality: N/A;  . PROSTATE SURGERY    . UMBILICAL HERNIA REPAIR N/A 02/05/2016   Procedure: UMBILICAL HERNIA REPAIR WITH MESH PATCH;  Surgeon: Armandina Gemma, MD;  Location: [place];  Service: General;  Laterality: N/A;        Home Medications    Prior to Admission medications   Medication Sig Start Date End Date Taking? Authorizing Provider  doxycycline (VIBRAMYCIN) 100 MG capsule Take 1 capsule (100 mg total) by mouth 2 (two) times daily for 7 days. 11/19/17 11/26/17  Duffy Bruce, MD  finasteride (PROSCAR) 5 MG tablet Take 5 mg by  mouth daily.    [provider]  HYDROcodone-acetaminophen (NORCO/VICODIN) 5-325 MG tablet Take 1-2 tablets by mouth every 4 (four) hours as needed for moderate pain. 02/05/16   Armandina Gemma, MD  naproxen (NAPROSYN) 375 MG tablet Take 1 tablet (375 mg total) by mouth 2 (two) times daily with a meal for 7 days. 11/19/17 11/26/17  Duffy Bruce, MD  silodosin (RAPAFLO) 8 MG CAPS capsule Take 8 mg by mouth daily with breakfast.    [provider]    Family History No family history on file.  Social History Social History   Tobacco Use  . Smoking status: Never Smoker  . Smokeless tobacco: Never Used  Substance Use Topics  . Alcohol use: No  . Drug use: No     Allergies   Patient has no known allergies.   Review of Systems Review of Systems  Constitutional: Negative for chills and fever.  Respiratory: Negative for shortness of breath.   Cardiovascular: Negative for chest pain.  Musculoskeletal: Positive for gait problem (Due to pain). Negative for neck pain.  Skin: Positive for wound. Negative for rash.  Allergic/Immunologic: Negative for immunocompromised state.  Neurological: Negative for weakness and numbness.  Hematological: Does not bruise/bleed easily.     Physical  Exam Updated Vital Signs BP (!) 149/99   Pulse 78   Temp 98.6 F (37 C) (Oral)   Resp 18   SpO2 99%   Physical Exam  Constitutional: He is oriented to person, place, and time. He appears well-developed and well-nourished. No distress.  HENT:  Head: Normocephalic and atraumatic.  Eyes: Conjunctivae are normal.  Neck: Neck supple.  Cardiovascular: Normal rate, regular rhythm and normal heart sounds.  Pulmonary/Chest: Effort normal. No respiratory distress. He has no wheezes.  Abdominal: He exhibits no distension.  Musculoskeletal: He exhibits no edema.  Neurological: He is alert and oriented to person, place, and time. He exhibits normal muscle tone.  Skin: Skin is warm. Capillary  refill takes less than 2 seconds. No rash noted.  Nursing note and vitals reviewed.   LOWER EXTREMITY EXAM: RIGHT  INSPECTION & PALPATION: Superficial callus and mild skin breakdown at base of heel, minimal erythema. No drainage. No expressible purulence. No palpable FB or open wounds.  SENSORY: sensation is intact to light touch in:  Superficial peroneal nerve distribution (over dorsum of foot) Deep peroneal nerve distribution (over first dorsal web space) Sural nerve distribution (over lateral aspect 5th metatarsal) Saphenous nerve distribution (over medial instep)  MOTOR:  + Motor EHL (great toe dorsiflexion) + FHL (great toe plantar flexion)  + TA (ankle dorsiflexion)  + GSC (ankle plantar flexion)  VASCULAR: 2+ dorsalis pedis and posterior tibialis pulses Capillary refill < 2 sec, toes warm and well-perfused  COMPARTMENTS: Soft, warm, well-perfused No pain with passive extension No parethesias    ED Treatments / Results  Labs (all labs ordered are listed, but only abnormal results are displayed) Labs Reviewed - No data to display  EKG None  Radiology Dg Foot Complete Right  Result Date: 11/19/2017 CLINICAL DATA:  Right heel wound for 1 month. EXAM: RIGHT FOOT COMPLETE - 3+ VIEW COMPARISON:  None. FINDINGS: No soft tissue gas or opaque foreign body. No evidence of osteomyelitis. First MTP and great toe sesamoid spurring IMPRESSION: No acute finding. No opaque foreign body or evidence of osteomyelitis. Electronically Signed   By: Monte Fantasia M.D.   On: 11/19/2017 09:01    Procedures Procedures (including critical care time)  Medications Ordered in ED Medications  naproxen (NAPROSYN) tablet 375 mg (375 mg Oral Given 11/19/17 0808)  bacitracin ointment ( Topical Given 11/19/17 0808)     Initial Impression / Assessment and Plan / ED Course  I have reviewed the triage vital signs and the nursing notes.  Pertinent labs & imaging results that were available  during my care of the patient were reviewed by me and considered in my medical decision making (see chart for details).     72 year old male here with right heel pain.  I suspect this is due to local pressure injury from wearing his shoes and standing all day at work.  He has a superficial callus with some mild erythema concern for possible early infection.  Plain films show no acute abnormality.  Will have him start to wear padding, start antibiotics, and refer to podiatry for further evaluation.  No evidence of osteo-or deep infection.  Denies any injury.   Final Clinical Impressions(s) / ED Diagnoses   Final diagnoses:  Calcaneal spur of right foot    ED Discharge Orders        Ordered    doxycycline (VIBRAMYCIN) 100 MG capsule  2 times daily     11/19/17 0947    naproxen (NAPROSYN) 375 MG  tablet  2 times daily with meals     11/19/17 0947       Duffy Bruce, MD 11/19/17 1512

## 2017-11-19 NOTE — ED Notes (Signed)
Patient transported to X-ray 

## 2018-01-23 ENCOUNTER — Encounter (HOSPITAL_COMMUNITY): Payer: Self-pay

## 2018-01-23 ENCOUNTER — Other Ambulatory Visit: Payer: Self-pay

## 2018-01-23 ENCOUNTER — Emergency Department (HOSPITAL_COMMUNITY)
Admission: EM | Admit: 2018-01-23 | Discharge: 2018-01-23 | Disposition: A | Discharge status: Home / Self Care | Payer: Medicare Other | Attending: Emergency Medicine | Admitting: Emergency Medicine

## 2018-01-23 DIAGNOSIS — R339 Retention of urine, unspecified: Secondary | ICD-10-CM | POA: Diagnosis not present

## 2018-01-23 DIAGNOSIS — Z79899 Other long term (current) drug therapy: Secondary | ICD-10-CM | POA: Insufficient documentation

## 2018-01-23 DIAGNOSIS — I10 Essential (primary) hypertension: Secondary | ICD-10-CM | POA: Diagnosis not present

## 2018-01-23 HISTORY — DX: Essential (primary) hypertension: I10

## 2018-01-23 LAB — I-STAT CHEM 8, ED
BUN: 18 mg/dL (ref 8–23)
Calcium, Ion: 1.1 mmol/L — ABNORMAL LOW (ref 1.15–1.40)
Chloride: 105 mmol/L (ref 98–111)
Creatinine, Ser: 0.9 mg/dL (ref 0.61–1.24)
Glucose, Bld: 123 mg/dL — ABNORMAL HIGH (ref 70–99)
HCT: 33 % — ABNORMAL LOW (ref 39.0–52.0)
Hemoglobin: 11.2 g/dL — ABNORMAL LOW (ref 13.0–17.0)
Potassium: 3.3 mmol/L — ABNORMAL LOW (ref 3.5–5.1)
Sodium: 139 mmol/L (ref 135–145)
TCO2: 24 mmol/L (ref 22–32)

## 2018-01-23 LAB — URINALYSIS, ROUTINE W REFLEX MICROSCOPIC
Bacteria, UA: NONE SEEN
Bilirubin Urine: NEGATIVE
Glucose, UA: NEGATIVE mg/dL
Ketones, ur: NEGATIVE mg/dL
Leukocytes, UA: NEGATIVE
Nitrite: NEGATIVE
Protein, ur: NEGATIVE mg/dL
Specific Gravity, Urine: 1.011 (ref 1.005–1.030)
pH: 7 (ref 5.0–8.0)

## 2018-01-23 NOTE — ED Triage Notes (Signed)
Pt BIBA for c/o sudden onset of lower abd pain with distention that began around 2 pm today along with nausea but no active vomiting ; pt states he had a small BM today around 1 pm; pt also states he hasn't been able to pee since 2 pm

## 2018-01-23 NOTE — ED Notes (Signed)
Applied leg bag, teaching done with translator.  D/c teaching including follow up with urology discussed with pt using translator by Dr. Tamera Punt

## 2018-01-23 NOTE — ED Provider Notes (Signed)
[facility] Provider Note   CSN: [id number] Arrival date & time: 01/23/18  1610     History   Chief Complaint Chief Complaint  Patient presents with  . Abdominal Pain    HPI [facility] is a 72 y.o. male.  Patient is a 72 year old male who presents with abdominal pain.  History is obtained through the Spanish video language line.  He states that he has not been able to urinate since last night.  He had sudden onset of pain in his lower abdomen about 3 hours prior to arrival.  He complains of intense pain to his lower abdomen bilaterally.  He has had some nausea but no vomiting.  He was diaphoretic on EMS arrival.  No known fevers.  No history of similar symptoms in the past.     Past Medical History:  Diagnosis Date  . Hypertension   . Nocturia   . Umbilical hernia     Patient Active Problem List   Diagnosis Date Noted  . Umbilical hernia 98/03/9146    Past Surgical History:  Procedure Laterality Date  . CHOLECYSTECTOMY    . COLON SURGERY    . INSERTION OF MESH N/A 02/05/2016   Procedure: INSERTION OF MESH, UMBILICAL HERNIA;  Surgeon: Armandina Gemma, MD;  Location: [place];  Service: General;  Laterality: N/A;  . PROSTATE SURGERY    . UMBILICAL HERNIA REPAIR N/A 02/05/2016   Procedure: UMBILICAL HERNIA REPAIR WITH MESH PATCH;  Surgeon: Armandina Gemma, MD;  Location: [facility];  Service: General;  Laterality: N/A;        Home Medications    Prior to Admission medications   Medication Sig Start Date End Date Taking? Authorizing Provider  finasteride (PROSCAR) 5 MG tablet Take 5 mg by mouth daily.    [provider]  HYDROcodone-acetaminophen (NORCO/VICODIN) 5-325 MG tablet Take 1-2 tablets by mouth every 4 (four) hours as needed for moderate pain. 02/05/16   Armandina Gemma, MD  silodosin (RAPAFLO) 8 MG CAPS capsule Take 8 mg by mouth daily with breakfast.    [provider]     Family History No family history on file.  Social History Social History   Tobacco Use  . Smoking status: Never Smoker  . Smokeless tobacco: Never Used  Substance Use Topics  . Alcohol use: No  . Drug use: No     Allergies   Patient has no known allergies.   Review of Systems Review of Systems  Constitutional: Negative for chills, diaphoresis, fatigue and fever.  HENT: Negative for congestion, rhinorrhea and sneezing.   Eyes: Negative.   Respiratory: Negative for cough, chest tightness and shortness of breath.   Cardiovascular: Negative for chest pain and leg swelling.  Gastrointestinal: Positive for abdominal pain and nausea. Negative for blood in stool, diarrhea and vomiting.  Genitourinary: Positive for difficulty urinating. Negative for flank pain, frequency and hematuria.  Musculoskeletal: Negative for arthralgias and back pain.  Skin: Negative for rash.  Neurological: Negative for dizziness, speech difficulty, weakness, numbness and headaches.     Physical Exam Updated Vital Signs BP (!) 180/93   Pulse 89   Temp 97.6 F (36.4 C) (Oral)   Resp (!) 29   Ht 5\' 11"  (1.803 m)   Wt 65.3 kg   SpO2 98%   BMI 20.08 kg/m   Physical Exam  Constitutional: He is oriented to person, place, and time. He appears well-developed and well-nourished. He appears distressed.  HENT:  Head: Normocephalic and atraumatic.  Eyes: Pupils are equal, round, and reactive to light.  Neck: Normal range of motion. Neck supple.  Cardiovascular: Normal rate, regular rhythm and normal heart sounds.  Pulmonary/Chest: Effort normal and breath sounds normal. No respiratory distress. He has no wheezes. He has no rales. He exhibits no tenderness.  Abdominal: Soft. Bowel sounds are normal. There is tenderness in the right lower quadrant, suprapubic area and left lower quadrant. There is no rebound and no guarding.  Musculoskeletal: Normal range of motion. He exhibits no edema.   Lymphadenopathy:    He has no cervical adenopathy.  Neurological: He is alert and oriented to person, place, and time.  Skin: Skin is warm and dry. No rash noted.  Psychiatric: He has a normal mood and affect.     ED Treatments / Results  Labs (all labs ordered are listed, but only abnormal results are displayed) Labs Reviewed  URINALYSIS, ROUTINE W REFLEX MICROSCOPIC - Abnormal; Notable for the following components:      Result Value   Hgb urine dipstick LARGE (*)    All other components within normal limits  I-STAT CHEM 8, ED - Abnormal; Notable for the following components:   Potassium 3.3 (*)    Glucose, Bld 123 (*)    Calcium, Ion 1.10 (*)    Hemoglobin 11.2 (*)    HCT 33.0 (*)    All other components within normal limits    EKG EKG Interpretation  Date/Time:  Tuesday January 23 2018 16:10:54 EDT Ventricular Rate:  80 PR Interval:    QRS Duration: 100 QT Interval:  417 QTC Calculation: 493 R Axis:   -19 Text Interpretation:  Sinus rhythm Borderline left axis deviation Abnormal R-wave progression, early transition ST elevation suggests acute pericarditis Borderline prolonged QT interval since last tracing no significant change Confirmed by Malvin Johns [phone]) on 01/23/2018 5:33:42 PM   Radiology No results found.  Procedures Procedures (including critical care time)  Medications Ordered in ED Medications - No data to display   Initial Impression / Assessment and Plan / ED Course  I have reviewed the triage vital signs and the nursing notes.  Pertinent labs & imaging results that were available during my care of the patient were reviewed by me and considered in my medical decision making (see chart for details).     Patient is a 72 year old male who presents with abdominal pain.  He is found to be in urinary retention.  A Foley catheter was placed and he had over a liter of urine return.  His creatinine is normal.  There is no suggestions of infection.   He is doing much better following the Foley placement.  He was discharged home with a leg bag.  He was given a referral to follow-up with [facility].  He was encouraged to make an follow-up appointment.  Return precautions were given.  Final Clinical Impressions(s) / ED Diagnoses   Final diagnoses:  Urinary retention    ED Discharge Orders    None       Malvin Johns, MD 01/23/18 [phone]

## 2018-02-08 ENCOUNTER — Other Ambulatory Visit: Payer: Self-pay

## 2018-02-08 ENCOUNTER — Emergency Department (HOSPITAL_COMMUNITY)
Admission: EM | Admit: 2018-02-08 | Discharge: 2018-02-08 | Disposition: A | Discharge status: Home / Self Care | Payer: Medicare Other | Attending: Emergency Medicine | Admitting: Emergency Medicine

## 2018-02-08 ENCOUNTER — Encounter (HOSPITAL_COMMUNITY): Payer: Self-pay

## 2018-02-08 DIAGNOSIS — R1030 Lower abdominal pain, unspecified: Secondary | ICD-10-CM | POA: Diagnosis not present

## 2018-02-08 DIAGNOSIS — R339 Retention of urine, unspecified: Secondary | ICD-10-CM

## 2018-02-08 DIAGNOSIS — I10 Essential (primary) hypertension: Secondary | ICD-10-CM | POA: Diagnosis not present

## 2018-02-08 DIAGNOSIS — R338 Other retention of urine: Secondary | ICD-10-CM | POA: Diagnosis not present

## 2018-02-08 DIAGNOSIS — Z79899 Other long term (current) drug therapy: Secondary | ICD-10-CM | POA: Diagnosis not present

## 2018-02-08 HISTORY — DX: Retention of urine, unspecified: R33.9

## 2018-02-08 LAB — URINALYSIS, ROUTINE W REFLEX MICROSCOPIC
Bilirubin Urine: NEGATIVE
Glucose, UA: NEGATIVE mg/dL
Ketones, ur: NEGATIVE mg/dL
Leukocytes, UA: NEGATIVE
Nitrite: NEGATIVE
Protein, ur: NEGATIVE mg/dL
Specific Gravity, Urine: 1.002 — ABNORMAL LOW (ref 1.005–1.030)
pH: 7 (ref 5.0–8.0)

## 2018-02-08 LAB — BASIC METABOLIC PANEL
Anion gap: 11 (ref 5–15)
BUN: 12 mg/dL (ref 8–23)
CO2: 26 mmol/L (ref 22–32)
Calcium: 9.5 mg/dL (ref 8.9–10.3)
Chloride: 104 mmol/L (ref 98–111)
Creatinine, Ser: 0.96 mg/dL (ref 0.61–1.24)
GFR calc Af Amer: >60 mL/min (ref >60)
GFR calc non Af Amer: >60 mL/min (ref >60)
Glucose, Bld: 108 mg/dL — ABNORMAL HIGH (ref 70–99)
Potassium: 3.9 mmol/L (ref 3.5–5.1)
Sodium: 141 mmol/L (ref 135–145)

## 2018-02-08 NOTE — ED Triage Notes (Signed)
Pt presents with no urine output since last night.  Pt was seen here last  Week for same, had foley placed that was removed on Wednesday.

## 2018-02-08 NOTE — ED Notes (Signed)
Hooked patient up to the monitor patient is resting with call bell in reach 

## 2018-02-08 NOTE — ED Provider Notes (Signed)
[facility] EMERGENCY DEPARTMENT Provider Note   CSN: [id number] Arrival date & time: 02/08/18  1105     History   Chief Complaint Chief Complaint  Patient presents with  . Urinary Retention    HPI [name] is a 72 y.o. male.  72yo M w/ PMH including HTN, urinary retention who p/w urinary retention. Pt was seen here recently for acute urinary retention and had foley catheter placed. He was seen in urology clinic last week and foley was removed 6 days ago. Last night he began retaining urine again has not been able to urinate since last night.  He reports severe his comfort and distention in his lower abdomen just like the last time he had urinary retention.  No vomiting, fevers, diarrhea, or other complaints.  He has been taking Flomax.  The history is provided by the patient. The history is limited by a language barrier. A language interpreter was used.    Past Medical History:  Diagnosis Date  . Hypertension   . Nocturia   . Umbilical hernia     Patient Active Problem List   Diagnosis Date Noted  . Umbilical hernia 24/01/7352    Past Surgical History:  Procedure Laterality Date  . CHOLECYSTECTOMY    . COLON SURGERY    . INSERTION OF MESH N/A 02/05/2016   Procedure: INSERTION OF MESH, UMBILICAL HERNIA;  Surgeon: Armandina Gemma, MD;  Location: [place];  Service: General;  Laterality: N/A;  . PROSTATE SURGERY    . UMBILICAL HERNIA REPAIR N/A 02/05/2016   Procedure: UMBILICAL HERNIA REPAIR WITH MESH PATCH;  Surgeon: Armandina Gemma, MD;  Location: [facility];  Service: General;  Laterality: N/A;        Home Medications    Prior to Admission medications   Medication Sig Start Date End Date Taking? Authorizing Provider  ferrous sulfate 325 (65 FE) MG tablet Take 325 mg by mouth daily. 12/19/17  Yes [provider]  finasteride (PROSCAR) 5 MG tablet Take 5 mg by mouth at bedtime.    Yes [provider]  gabapentin (NEURONTIN) 400 MG capsule Take 400 mg by mouth 2 (two) times daily. 11/13/17  Yes [provider]  ibuprofen (ADVIL,MOTRIN) 800 MG tablet Take 800 mg by mouth daily. 01/09/18  Yes [provider]  lisinopril (PRINIVIL,ZESTRIL) 20 MG tablet Take 20 mg by mouth daily. 01/29/18  Yes [provider]  omeprazole (PRILOSEC) 40 MG capsule Take 40 mg by mouth daily. 01/17/18  Yes [provider]  promethazine (PHENERGAN) 25 MG tablet Take 25 mg by mouth 3 (three) times daily as needed for nausea. 01/17/18  Yes [provider]  silodosin (RAPAFLO) 8 MG CAPS capsule Take 8 mg by mouth daily with breakfast.   Yes [provider]  VESICARE 10 MG tablet Take 10 mg by mouth at bedtime. 01/26/18  Yes [provider]  Vitamin D, Ergocalciferol, (DRISDOL) 50000 units CAPS capsule Take 50,000 Units by mouth every Monday. 01/21/18  Yes [provider]  HYDROcodone-acetaminophen (NORCO/VICODIN) 5-325 MG tablet Take 1-2 tablets by mouth every 4 (four) hours as needed for moderate pain. Patient not taking: Reported on 02/08/2018 02/05/16   Armandina Gemma, MD    Family History History reviewed. No pertinent family history.  Social History Social History   Tobacco Use  . Smoking status: Never Smoker  . Smokeless tobacco: Never Used  Substance Use Topics  . Alcohol use: No  . Drug  use: No     Allergies   Patient has no known allergies.   Review of Systems Review of Systems All other systems reviewed and are negative except that which was mentioned in HPI   Physical Exam Updated Vital Signs BP 140/81   Pulse (!) 55   Temp 98.7 F (37.1 C) (Oral)   Resp 18   SpO2 100%   Physical Exam  Constitutional: He is oriented to person, place, and time. He appears well-developed and well-nourished.  Sitting up on side of bed, in mild distress due to pain  HENT:  Head: Normocephalic and atraumatic.  Moist mucous membranes  Eyes:  Conjunctivae are normal.  Neck: Neck supple.  Cardiovascular: Normal rate, regular rhythm and normal heart sounds.  No murmur heard. Pulmonary/Chest: Effort normal and breath sounds normal.  Abdominal: Soft. Bowel sounds are normal. He exhibits no distension. There is tenderness.  Tenderness and bladder distension in lower abdomen  Musculoskeletal: He exhibits no edema.  Neurological: He is alert and oriented to person, place, and time.  Fluent speech  Skin: Skin is warm and dry.  Psychiatric: He has a normal mood and affect. Judgment normal.  Nursing note and vitals reviewed.    ED Treatments / Results  Labs (all labs ordered are listed, but only abnormal results are displayed) Labs Reviewed  URINALYSIS, ROUTINE W REFLEX MICROSCOPIC - Abnormal; Notable for the following components:      Result Value   Color, Urine STRAW (*)    Specific Gravity, Urine 1.002 (*)    Hgb urine dipstick LARGE (*)    Bacteria, UA RARE (*)    All other components within normal limits  BASIC METABOLIC PANEL - Abnormal; Notable for the following components:   Glucose, Bld 108 (*)    All other components within normal limits    EKG None  Radiology No results found.  Procedures Procedures (including critical care time)  Medications Ordered in ED Medications - No data to display   Initial Impression / Assessment and Plan / ED Course  I have reviewed the triage vital signs and the nursing notes.  Pertinent labs  that were available during my care of the patient were reviewed by me and considered in my medical decision making (see chart for details).     Pt w/ >928ml urine in bladder, foley catheter placed without complication. UA without signs of infection, Creatinine normal.  Patient back to follow-up with [facility] again regarding further management.  Return precautions reviewed.  Final Clinical Impressions(s) / ED Diagnoses   Final diagnoses:  Acute urinary retention    ED  Discharge Orders    None       Little, Wenda Overland, MD 02/08/18 1444

## 2018-02-13 ENCOUNTER — Other Ambulatory Visit: Payer: Self-pay | Admitting: Urology

## 2018-02-28 ENCOUNTER — Encounter (HOSPITAL_BASED_OUTPATIENT_CLINIC_OR_DEPARTMENT_OTHER): Payer: Self-pay

## 2018-03-02 ENCOUNTER — Other Ambulatory Visit: Payer: Self-pay

## 2018-03-02 ENCOUNTER — Encounter (HOSPITAL_BASED_OUTPATIENT_CLINIC_OR_DEPARTMENT_OTHER): Payer: Self-pay | Admitting: *Deleted

## 2018-03-02 NOTE — Progress Notes (Signed)
Spoke w/ pt daughter, [facility], via phone for pre-op interview.  Pt speaks only spanish.  Daughter verbalized understanding for pt to be npo after mn with exception sip of water with prilosec and arrive at 0900. Will need hg, current bmp (dated 02-08-2018) , and current ekg in chart and epic.  Pt daughter will be bringing pt dos but will leave before he goes back to surgery and will be the one to pick him up next day since he is an overnight.  Daughter verbalized understanding for pt to bring medication in prescription bottles and pt to picked up next day no later than 0900.  Requested spanish interpreter to arrive at 0900 via email to interpreter [place] (copy of email w/ chart).  Daughter stated no one will be staying the night with him but she is available anytime via phone to help with interpreting , [phone].

## 2018-03-05 ENCOUNTER — Ambulatory Visit (HOSPITAL_BASED_OUTPATIENT_CLINIC_OR_DEPARTMENT_OTHER): Payer: Medicare Other | Admitting: Anesthesiology

## 2018-03-05 ENCOUNTER — Encounter (HOSPITAL_BASED_OUTPATIENT_CLINIC_OR_DEPARTMENT_OTHER)
Admission: RE | Disposition: A | Discharge status: Home / Self Care | Payer: Self-pay | Source: Ambulatory Visit | Attending: Urology

## 2018-03-05 ENCOUNTER — Encounter (HOSPITAL_BASED_OUTPATIENT_CLINIC_OR_DEPARTMENT_OTHER): Payer: Self-pay | Admitting: *Deleted

## 2018-03-05 ENCOUNTER — Observation Stay (HOSPITAL_BASED_OUTPATIENT_CLINIC_OR_DEPARTMENT_OTHER)
Admission: RE | Admit: 2018-03-05 | Discharge: 2018-03-06 | Disposition: A | Discharge status: Home / Self Care | Payer: Medicare Other | Source: Ambulatory Visit | Attending: Urology | Admitting: Urology

## 2018-03-05 ENCOUNTER — Other Ambulatory Visit: Payer: Self-pay

## 2018-03-05 DIAGNOSIS — K760 Fatty (change of) liver, not elsewhere classified: Secondary | ICD-10-CM | POA: Diagnosis not present

## 2018-03-05 DIAGNOSIS — R338 Other retention of urine: Secondary | ICD-10-CM | POA: Diagnosis not present

## 2018-03-05 DIAGNOSIS — Z79899 Other long term (current) drug therapy: Secondary | ICD-10-CM | POA: Insufficient documentation

## 2018-03-05 DIAGNOSIS — I10 Essential (primary) hypertension: Secondary | ICD-10-CM | POA: Diagnosis not present

## 2018-03-05 DIAGNOSIS — N3080 Other cystitis without hematuria: Secondary | ICD-10-CM | POA: Diagnosis not present

## 2018-03-05 DIAGNOSIS — N401 Enlarged prostate with lower urinary tract symptoms: Secondary | ICD-10-CM | POA: Diagnosis not present

## 2018-03-05 DIAGNOSIS — D509 Iron deficiency anemia, unspecified: Secondary | ICD-10-CM | POA: Diagnosis not present

## 2018-03-05 DIAGNOSIS — N400 Enlarged prostate without lower urinary tract symptoms: Secondary | ICD-10-CM | POA: Diagnosis present

## 2018-03-05 HISTORY — DX: Iron deficiency anemia, unspecified: D50.9

## 2018-03-05 HISTORY — DX: Fatty (change of) liver, not elsewhere classified: K76.0

## 2018-03-05 HISTORY — DX: Presence of urogenital implants: Z96.0

## 2018-03-05 HISTORY — PX: TRANSURETHRAL RESECTION OF PROSTATE: SHX73

## 2018-03-05 HISTORY — DX: Other intervertebral disc degeneration, lumbosacral region: M51.37

## 2018-03-05 HISTORY — DX: Benign prostatic hyperplasia without lower urinary tract symptoms: N40.0

## 2018-03-05 HISTORY — DX: Presence of other specified devices: Z97.8

## 2018-03-05 HISTORY — DX: Spondylosis, unspecified: M47.9

## 2018-03-05 HISTORY — DX: Cyst of kidney, acquired: N28.1

## 2018-03-05 HISTORY — DX: Retention of urine, unspecified: R33.9

## 2018-03-05 HISTORY — DX: Personal history of other diseases of the digestive system: Z87.19

## 2018-03-05 HISTORY — DX: Hepatomegaly, not elsewhere classified: R16.0

## 2018-03-05 HISTORY — DX: Low back pain: M54.5

## 2018-03-05 HISTORY — DX: Low back pain, unspecified: M54.50

## 2018-03-05 HISTORY — DX: Other intervertebral disc degeneration, lumbosacral region without mention of lumbar back pain or lower extremity pain: M51.379

## 2018-03-05 HISTORY — DX: Personal history of other diseases of male genital organs: Z87.438

## 2018-03-05 LAB — BASIC METABOLIC PANEL
Anion gap: 9 (ref 5–15)
BUN: 15 mg/dL (ref 8–23)
CO2: 30 mmol/L (ref 22–32)
Calcium: 9 mg/dL (ref 8.9–10.3)
Chloride: 103 mmol/L (ref 98–111)
Creatinine, Ser: 0.85 mg/dL (ref 0.61–1.24)
GFR calc Af Amer: >60 mL/min (ref >60)
GFR calc non Af Amer: >60 mL/min (ref >60)
Glucose, Bld: 144 mg/dL — ABNORMAL HIGH (ref 70–99)
Potassium: 4.6 mmol/L (ref 3.5–5.1)
Sodium: 142 mmol/L (ref 135–145)

## 2018-03-05 LAB — POCT HEMOGLOBIN-HEMACUE: Hemoglobin: 14.3 g/dL (ref 13.0–17.0)

## 2018-03-05 LAB — CBC
HCT: 39.9 % (ref 39.0–52.0)
Hemoglobin: 12.5 g/dL — ABNORMAL LOW (ref 13.0–17.0)
MCH: 30.4 pg (ref 26.0–34.0)
MCHC: 31.3 g/dL (ref 30.0–36.0)
MCV: 97.1 fL (ref 80.0–100.0)
Platelets: 297 10*3/uL (ref 150–400)
RBC: 4.11 MIL/uL — ABNORMAL LOW (ref 4.22–5.81)
RDW: 13 % (ref 11.5–15.5)
WBC: 13.6 10*3/uL — ABNORMAL HIGH (ref 4.0–10.5)
nRBC: 0 % (ref 0.0–0.2)

## 2018-03-05 SURGERY — TURP (TRANSURETHRAL RESECTION OF PROSTATE)
Anesthesia: General | Site: Prostate

## 2018-03-05 MED ORDER — MIDAZOLAM HCL 2 MG/2ML IJ SOLN
INTRAMUSCULAR | Status: AC
  Filled 2018-03-05: qty 2

## 2018-03-05 MED ORDER — ONDANSETRON HCL 4 MG/2ML IJ SOLN
INTRAMUSCULAR | Status: DC | PRN
  Administered 2018-03-05: 4 mg via INTRAVENOUS

## 2018-03-05 MED ORDER — ONDANSETRON HCL 4 MG/2ML IJ SOLN
INTRAMUSCULAR | Status: AC
  Filled 2018-03-05: qty 2

## 2018-03-05 MED ORDER — OXYCODONE-ACETAMINOPHEN 5-325 MG PO TABS
ORAL_TABLET | ORAL | Status: AC
  Filled 2018-03-05: qty 1

## 2018-03-05 MED ORDER — ACETAMINOPHEN 325 MG PO TABS
650.0000 mg | ORAL_TABLET | ORAL | Status: DC | PRN
  Administered 2018-03-06: 650 mg via ORAL
  Filled 2018-03-05: qty 2

## 2018-03-05 MED ORDER — EPHEDRINE SULFATE-NACL 50-0.9 MG/10ML-% IV SOSY
PREFILLED_SYRINGE | INTRAVENOUS | Status: DC | PRN
  Administered 2018-03-05 (×2): 10 mg via INTRAVENOUS

## 2018-03-05 MED ORDER — LACTATED RINGERS IV SOLN
INTRAVENOUS | Status: DC
  Administered 2018-03-05 (×2): via INTRAVENOUS
  Filled 2018-03-05: qty 1000

## 2018-03-05 MED ORDER — CEFAZOLIN SODIUM-DEXTROSE 2-4 GM/100ML-% IV SOLN
2.0000 g | INTRAVENOUS | Status: AC
  Administered 2018-03-05: 2 g via INTRAVENOUS
  Filled 2018-03-05: qty 100

## 2018-03-05 MED ORDER — DIPHENHYDRAMINE HCL 50 MG/ML IJ SOLN
12.5000 mg | Freq: Four times a day (QID) | INTRAMUSCULAR | Status: DC | PRN
  Filled 2018-03-05: qty 0.25

## 2018-03-05 MED ORDER — DEXAMETHASONE SODIUM PHOSPHATE 10 MG/ML IJ SOLN
INTRAMUSCULAR | Status: AC
  Filled 2018-03-05: qty 1

## 2018-03-05 MED ORDER — KETOROLAC TROMETHAMINE 30 MG/ML IJ SOLN
INTRAMUSCULAR | Status: DC | PRN
  Administered 2018-03-05: 30 mg via INTRAVENOUS

## 2018-03-05 MED ORDER — CEFAZOLIN SODIUM-DEXTROSE 2-4 GM/100ML-% IV SOLN
INTRAVENOUS | Status: AC
  Filled 2018-03-05: qty 100

## 2018-03-05 MED ORDER — BELLADONNA ALKALOIDS-OPIUM 16.2-60 MG RE SUPP
1.0000 | Freq: Four times a day (QID) | RECTAL | Status: DC | PRN
  Filled 2018-03-05: qty 1

## 2018-03-05 MED ORDER — FENTANYL CITRATE (PF) 100 MCG/2ML IJ SOLN
INTRAMUSCULAR | Status: AC
  Filled 2018-03-05: qty 2

## 2018-03-05 MED ORDER — GABAPENTIN 400 MG PO CAPS
400.0000 mg | ORAL_CAPSULE | Freq: Two times a day (BID) | ORAL | Status: DC
  Filled 2018-03-05: qty 1

## 2018-03-05 MED ORDER — FENTANYL CITRATE (PF) 100 MCG/2ML IJ SOLN
25.0000 ug | INTRAMUSCULAR | Status: DC | PRN
  Filled 2018-03-05: qty 1

## 2018-03-05 MED ORDER — ONDANSETRON HCL 4 MG/2ML IJ SOLN
4.0000 mg | INTRAMUSCULAR | Status: DC | PRN
  Filled 2018-03-05: qty 2

## 2018-03-05 MED ORDER — DIPHENHYDRAMINE HCL 12.5 MG/5ML PO ELIX
12.5000 mg | ORAL_SOLUTION | Freq: Four times a day (QID) | ORAL | Status: DC | PRN
  Filled 2018-03-05: qty 5

## 2018-03-05 MED ORDER — KETOROLAC TROMETHAMINE 30 MG/ML IJ SOLN
INTRAMUSCULAR | Status: AC
  Filled 2018-03-05: qty 1

## 2018-03-05 MED ORDER — PROPOFOL 10 MG/ML IV BOLUS
INTRAVENOUS | Status: AC
  Filled 2018-03-05: qty 20

## 2018-03-05 MED ORDER — ZOLPIDEM TARTRATE 5 MG PO TABS
5.0000 mg | ORAL_TABLET | Freq: Every evening | ORAL | Status: DC | PRN
  Filled 2018-03-05: qty 1

## 2018-03-05 MED ORDER — LIDOCAINE 2% (20 MG/ML) 5 ML SYRINGE
INTRAMUSCULAR | Status: DC | PRN
  Administered 2018-03-05: 60 mg via INTRAVENOUS

## 2018-03-05 MED ORDER — SODIUM CHLORIDE 0.9 % IR SOLN
3000.0000 mL | Status: DC
  Filled 2018-03-05: qty 3000

## 2018-03-05 MED ORDER — OXYCODONE-ACETAMINOPHEN 5-325 MG PO TABS
1.0000 | ORAL_TABLET | ORAL | Status: DC | PRN
  Administered 2018-03-05 (×2): 1 via ORAL
  Filled 2018-03-05: qty 2

## 2018-03-05 MED ORDER — DEXAMETHASONE SODIUM PHOSPHATE 10 MG/ML IJ SOLN
INTRAMUSCULAR | Status: DC | PRN
  Administered 2018-03-05: 10 mg via INTRAVENOUS

## 2018-03-05 MED ORDER — ONDANSETRON HCL 4 MG/2ML IJ SOLN
4.0000 mg | Freq: Once | INTRAMUSCULAR | Status: DC | PRN
  Filled 2018-03-05: qty 2

## 2018-03-05 MED ORDER — SODIUM CHLORIDE 0.9 % IR SOLN
Status: DC | PRN
  Administered 2018-03-05 (×7): 3000 mL

## 2018-03-05 MED ORDER — SODIUM CHLORIDE 0.9 % IV SOLN
INTRAVENOUS | Status: DC
  Administered 2018-03-06: 04:00:00 via INTRAVENOUS
  Filled 2018-03-05: qty 1000

## 2018-03-05 MED ORDER — HYDROMORPHONE HCL 1 MG/ML IJ SOLN
0.5000 mg | INTRAMUSCULAR | Status: DC | PRN
  Filled 2018-03-05: qty 1

## 2018-03-05 MED ORDER — FENTANYL CITRATE (PF) 100 MCG/2ML IJ SOLN
INTRAMUSCULAR | Status: DC | PRN
  Administered 2018-03-05: 25 ug via INTRAVENOUS
  Administered 2018-03-05: 50 ug via INTRAVENOUS
  Administered 2018-03-05: 25 ug via INTRAVENOUS

## 2018-03-05 MED ORDER — PANTOPRAZOLE SODIUM 40 MG PO TBEC
40.0000 mg | DELAYED_RELEASE_TABLET | Freq: Every day | ORAL | Status: DC
  Filled 2018-03-05 (×2): qty 1

## 2018-03-05 MED ORDER — FINASTERIDE 5 MG PO TABS
5.0000 mg | ORAL_TABLET | Freq: Every day | ORAL | Status: DC
  Filled 2018-03-05 (×2): qty 1

## 2018-03-05 MED ORDER — PROPOFOL 10 MG/ML IV BOLUS
INTRAVENOUS | Status: DC | PRN
  Administered 2018-03-05: 150 mg via INTRAVENOUS

## 2018-03-05 MED ORDER — LIDOCAINE 2% (20 MG/ML) 5 ML SYRINGE
INTRAMUSCULAR | Status: AC
  Filled 2018-03-05: qty 5

## 2018-03-05 SURGICAL SUPPLY — 25 items
BAG DRAIN URO-CYSTO SKYTR STRL (DRAIN) ×2 IMPLANT
BAG DRN ANRFLXCHMBR STRAP LEK (BAG)
BAG DRN UROCATH (DRAIN) ×1
BAG URINE DRAINAGE (UROLOGICAL SUPPLIES) IMPLANT
BAG URINE LEG 19OZ MD ST LTX (BAG) IMPLANT
CATH FOLEY 3WAY 30CC 22F (CATHETERS) ×4 IMPLANT
CLOTH BEACON ORANGE TIMEOUT ST (SAFETY) ×2 IMPLANT
ELECT REM PT RETURN 9FT ADLT (ELECTROSURGICAL)
ELECTRODE REM PT RTRN 9FT ADLT (ELECTROSURGICAL) IMPLANT
EVACUATOR MICROVAS BLADDER (UROLOGICAL SUPPLIES) IMPLANT
GLOVE BIO SURGEON STRL SZ7 (GLOVE) ×2 IMPLANT
GLOVE BIO SURGEON STRL SZ8 (GLOVE) ×2 IMPLANT
GLOVE BIOGEL PI IND STRL 7.5 (GLOVE) ×2 IMPLANT
GLOVE BIOGEL PI INDICATOR 7.5 (GLOVE) ×2
GOWN STRL REUS W/TWL LRG LVL3 (GOWN DISPOSABLE) ×4 IMPLANT
HOLDER FOLEY CATH W/STRAP (MISCELLANEOUS) ×2 IMPLANT
IV NS IRRIG 3000ML ARTHROMATIC (IV SOLUTION) ×14 IMPLANT
KIT TURNOVER CYSTO (KITS) ×2 IMPLANT
LOOP CUT BIPOLAR 24F LRG (ELECTROSURGICAL) ×2 IMPLANT
MANIFOLD NEPTUNE II (INSTRUMENTS) ×2 IMPLANT
PACK CYSTO (CUSTOM PROCEDURE TRAY) ×2 IMPLANT
PLUG CATH AND CAP STER (CATHETERS) IMPLANT
SYR 30ML LL (SYRINGE) ×2 IMPLANT
SYRINGE IRR TOOMEY STRL 70CC (SYRINGE) IMPLANT
TUBE CONNECTING 12X1/4 (SUCTIONS) ×2 IMPLANT

## 2018-03-05 NOTE — Op Note (Signed)
Preoperative diagnosis: BPH with urinary retention  Postoperative diagnosis: BPH  Procedure: 1 cystoscopy 2. Transurethral resection of the prostate  Attending: [name]  Anesthesia: General  Estimated blood loss: Minimal  Drains: 22 French foley  Specimens: 1. Prostate Chips  Antibiotics: Rocephin  Findings: Bilobar prostate enlargement. Ureteral orifices in normal anatomic location.   Indications: Patient is a 72 year old male with a history of BPH and urinary retention managed with a foley catheter.  After discussing treatment options, they decided proceed with transurethral resection of the prostate.  Procedure her in detail: The patient was brought to the operating room and a brief timeout was done to ensure correct patient, correct procedure, correct site.  General anesthesia was administered patient was placed in dorsal lithotomy position.  Their genitalia was then prepped and draped in usual sterile fashion.  A rigid 60 French cystoscope was passed in the urethra and the bladder.  Bladder was inspected and we noted no masses or lesions.  the ureteral orifices were in the normal orthotopic locations. removed the cystoscope and placed a resectoscope into the bladder. We then turned our attention to the prostate resection. Using the bipolar resectoscope we resected the median lobe first from the bladder neck to the verumontanum. We then started at the 12 oclock position on the left lobe and resection to the 6 o'clock position from the bladder neck to the verumontanum. We then did the same resection of the right lobe. Once the resection was complete we then cauterized individual bleeders. We then removed the prostate chips and sent them for pathology.  We then re-inspected the prostatic fossa and found no residual bleeding.  the bladder was then drained, a 22 French foley was placed and this concluded the procedure which was well tolerated by patient.  Complications:  None  Condition: Stable, extubated, transferred to PACU  Plan: Patient is admitted overnight with continuous bladder irrigation. If their urine is clear tomorrow they will be discharged home and followup in 5 days for foley catheter removal and pathology discussion.

## 2018-03-05 NOTE — Transfer of Care (Signed)
Last Vitals:  Vitals Value Taken Time  BP 146/98 03/05/2018  1:43 PM  Temp    Pulse 89 03/05/2018  1:45 PM  Resp 14 03/05/2018  1:44 PM  SpO2 100 % 03/05/2018  1:45 PM  Vitals shown include unvalidated device data.  Last Pain:  Vitals:   03/05/18 0917  TempSrc:   PainSc: 0-No pain      Patients Stated Pain Goal: 5 (03/05/18 5697)  Immediate Anesthesia Transfer of Care Note  Patient: [facility]  Procedure(s) Performed: Procedure(s) (LRB): TRANSURETHRAL RESECTION OF THE PROSTATE (TURP) (N/A)  Patient Location: PACU  Anesthesia Type: General  Level of Consciousness: awake, alert  and oriented  Airway & Oxygen Therapy: Patient Spontanous Breathing and Patient connected to nasal cannula oxygen  Post-op Assessment: Report given to PACU RN and Post -op Vital signs reviewed and stable  Post vital signs: Reviewed and stable  Complications: No apparent anesthesia complications

## 2018-03-05 NOTE — Anesthesia Procedure Notes (Signed)
Procedure Name: LMA Insertion Date/Time: 03/05/2018 11:59 AM Performed by: Murvin Natal, MD Pre-anesthesia Checklist: Patient identified, Emergency Drugs available, Suction available and Patient being monitored Patient Re-evaluated:Patient Re-evaluated prior to induction Oxygen Delivery Method: Circle system utilized Preoxygenation: Pre-oxygenation with 100% oxygen Induction Type: IV induction Ventilation: Mask ventilation without difficulty LMA: LMA inserted LMA Size: 4.0 Number of attempts: 1 Airway Equipment and Method: Bite block Placement Confirmation: positive ETCO2 Tube secured with: Tape Dental Injury: Teeth and Oropharynx as per pre-operative assessment

## 2018-03-05 NOTE — Anesthesia Preprocedure Evaluation (Addendum)
Anesthesia Evaluation  Patient identified by MRN, date of birth, ID band Patient awake    Reviewed: Allergy & Precautions, NPO status , Patient's Chart, lab work & pertinent test results  Airway Mallampati: I  TM Distance: >3 FB Neck ROM: Full    Dental  (+) Edentulous Upper, Edentulous Lower   Pulmonary neg pulmonary ROS,    Pulmonary exam normal breath sounds clear to auscultation       Cardiovascular hypertension, Pt. on medications Normal cardiovascular exam Rhythm:Regular Rate:Normal  ECG: SR, rate 80   Neuro/Psych negative neurological ROS  negative psych ROS   GI/Hepatic negative GI ROS, Neg liver ROS,   Endo/Other  negative endocrine ROS  Renal/GU negative Renal ROS     Musculoskeletal Spondylosis Lumbar    Abdominal   Peds  Hematology negative hematology ROS (+)   Anesthesia Other Findings BENIGN PROSTATIC HYPERPLASIA  Reproductive/Obstetrics                            Anesthesia Physical Anesthesia Plan  ASA: II  Anesthesia Plan: General   Post-op Pain Management:    Induction: Intravenous  PONV Risk Score and Plan: 2 and Ondansetron, Dexamethasone and Treatment may vary due to age or medical condition  Airway Management Planned: LMA  Additional Equipment:   Intra-op Plan:   Post-operative Plan: Extubation in OR  Informed Consent: I have reviewed the patients History and Physical, chart, labs and discussed the procedure including the risks, benefits and alternatives for the proposed anesthesia with the patient or authorized representative who has indicated his/her understanding and acceptance.   Dental advisory given  Plan Discussed with: CRNA  Anesthesia Plan Comments:         Anesthesia Quick Evaluation

## 2018-03-05 NOTE — H&P (Signed)
Urology Admission H&P  Chief Complaint: urinary retention  History of Present Illness: Danny Walker is a 72yo with a hx of BPH who developed urinary retention refractory to medical therapy. He has severe LUTS at baseline. He currently has an indwelling foley. No hematuria or dysuria. No fevers/chills/sweats  Past Medical History:  Diagnosis Date  . BPH (benign prostatic hyperplasia)   . DDD (degenerative disc disease), lumbosacral   . Fatty liver   . Foley catheter in place   . History of gallstones   . History of prostatitis   . Hypertension   . IDA (iron deficiency anemia)   . Liver mass 09/17/2001   4.7cm , noted on [place] abd  . Low back pain   . Nocturia   . 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 Spondylosis Lumbar  . Umbilical hernia   . Urinary retention 02/08/2018   Past Surgical History:  Procedure Laterality Date  . ERCP W/ SPHICTEROTOMY  09/17/2001  . INSERTION OF MESH N/A 02/05/2016   Procedure: INSERTION OF MESH, UMBILICAL HERNIA;  Surgeon: Danny Gemma, MD;  Location: [facility];  Service: General;  Laterality: N/A;  . LAPAROSCOPIC CHOLECYSTECTOMY  09/22/2001  . UMBILICAL HERNIA REPAIR N/A 02/05/2016   Procedure: UMBILICAL HERNIA REPAIR WITH MESH PATCH;  Surgeon: Danny Gemma, MD;  Location: [facility];  Service: General;  Laterality: N/A;    Home Medications:  Current Facility-Administered Medications  Medication Dose Route Frequency Provider Last Rate Last Dose  . ceFAZolin (ANCEF) IVPB 2g/100 mL premix  2 g Intravenous 30 min Pre-Op Danny Walker, Danny Furbish, MD      . lactated ringers infusion   Intravenous Continuous Danny Walker, Danny Kinnier, MD 50 mL/hr at 03/05/18 0940    . sodium chloride irrigation 0.9 %    PRN Danny Gustin, MD   3,000 mL at 03/05/18 1122   Allergies: No Known Allergies  History reviewed. No pertinent family history. Social History:  reports that he has never smoked. He has never used  smokeless tobacco. He reports that he does not drink alcohol or use drugs.  Review of Systems  Genitourinary: Positive for frequency and urgency.  All other systems reviewed and are negative.   Physical Exam:  Vital signs in last 24 hours: Temp:  [97.5 F (36.4 C)] 97.5 F (36.4 C) (10/14 0852) Pulse Rate:  [87] 87 (10/14 0852) Resp:  [16] 16 (10/14 0852) BP: (138)/(85) 138/85 (10/14 0852) SpO2:  [96 %] 96 % (10/14 0852) Weight:  [62 kg] 62 kg (10/14 0852) Physical Exam  Constitutional: He is oriented to person, place, and time. He appears well-developed and well-nourished.  HENT:  Head: Normocephalic and atraumatic.  Eyes: Pupils are equal, round, and reactive to light. EOM are normal.  Neck: Normal range of motion. No thyromegaly present.  Cardiovascular: Normal rate and regular rhythm.  Respiratory: Effort normal. No respiratory distress.  GI: Soft. He exhibits no distension.  Musculoskeletal: Normal range of motion. He exhibits no edema.  Neurological: He is alert and oriented to person, place, and time.  Skin: Skin is warm and dry.  Psychiatric: He has a normal mood and affect. His behavior is normal. Judgment and thought content normal.    Laboratory Data:  Results for orders placed or performed during the hospital encounter of 03/05/18 (from the past 24 hour(s))  Hemoglobin-hemacue, POC     Status: None   Collection Time: 03/05/18  9:43 AM  Result Value Ref Range   Hemoglobin 14.3 13.0 - 17.0 g/dL   No results found for this or any previous visit (from the past 240 hour(s)). Creatinine: No results for input(s): CREATININE in the last 168 hours. Baseline Creatinine: unknown  Impression/Assessment:  72yo with BPh and urinary retention  Plan:  The risks/benefits/alterantives to TURP was explained to the patient and he understands and wishes to proceed with surgery  [name] 03/05/2018, 11:35 AM

## 2018-03-06 ENCOUNTER — Encounter (HOSPITAL_BASED_OUTPATIENT_CLINIC_OR_DEPARTMENT_OTHER): Payer: Self-pay | Admitting: Urology

## 2018-03-06 DIAGNOSIS — N401 Enlarged prostate with lower urinary tract symptoms: Secondary | ICD-10-CM | POA: Diagnosis not present

## 2018-03-06 LAB — CBC
HCT: 33.9 % — ABNORMAL LOW (ref 39.0–52.0)
Hemoglobin: 10.7 g/dL — ABNORMAL LOW (ref 13.0–17.0)
MCH: 30.7 pg (ref 26.0–34.0)
MCHC: 31.6 g/dL (ref 30.0–36.0)
MCV: 97.1 fL (ref 80.0–100.0)
Platelets: 313 10*3/uL (ref 150–400)
RBC: 3.49 MIL/uL — ABNORMAL LOW (ref 4.22–5.81)
RDW: 13 % (ref 11.5–15.5)
WBC: 11 10*3/uL — ABNORMAL HIGH (ref 4.0–10.5)
nRBC: 0 % (ref 0.0–0.2)

## 2018-03-06 LAB — BASIC METABOLIC PANEL
Anion gap: 8 (ref 5–15)
BUN: 20 mg/dL (ref 8–23)
CO2: 28 mmol/L (ref 22–32)
Calcium: 8.7 mg/dL — ABNORMAL LOW (ref 8.9–10.3)
Chloride: 106 mmol/L (ref 98–111)
Creatinine, Ser: 1.08 mg/dL (ref 0.61–1.24)
GFR calc Af Amer: >60 mL/min (ref >60)
GFR calc non Af Amer: >60 mL/min (ref >60)
Glucose, Bld: 128 mg/dL — ABNORMAL HIGH (ref 70–99)
Potassium: 4 mmol/L (ref 3.5–5.1)
Sodium: 142 mmol/L (ref 135–145)

## 2018-03-06 MED ORDER — BACITRACIN ZINC 500 UNIT/GM EX OINT
TOPICAL_OINTMENT | CUTANEOUS | Status: DC | PRN
  Filled 2018-03-06: qty 28.35

## 2018-03-06 MED ORDER — ACETAMINOPHEN 325 MG PO TABS
ORAL_TABLET | ORAL | Status: AC
  Filled 2018-03-06: qty 2

## 2018-03-06 MED ORDER — BACITRACIN-NEOMYCIN-POLYMYXIN OINTMENT TUBE
TOPICAL_OINTMENT | CUTANEOUS | Status: DC | PRN
  Filled 2018-03-06: qty 14.17

## 2018-03-06 MED ORDER — BISACODYL 10 MG RE SUPP
10.0000 mg | Freq: Every day | RECTAL | Status: DC | PRN
  Administered 2018-03-06: 10 mg via RECTAL
  Filled 2018-03-06 (×2): qty 1

## 2018-03-06 MED ORDER — BACITRACIN-NEOMYCIN-POLYMYXIN 400-5-5000 EX OINT
TOPICAL_OINTMENT | CUTANEOUS | Status: AC
  Filled 2018-03-06: qty 1

## 2018-03-06 MED ORDER — OXYCODONE-ACETAMINOPHEN 5-325 MG PO TABS: 1.0000 | ORAL_TABLET | ORAL | 0 refills | Status: DC | PRN

## 2018-03-06 NOTE — Discharge Instructions (Signed)
Indwelling Urinary Catheter Care, Adult Take good care of your catheter to keep it working and to prevent problems. How to wear your catheter Attach your catheter to your leg with tape (adhesive tape) or a leg strap. Make sure it is not too tight. If you use tape, remove any bits of tape that are already on the catheter. How to wear a drainage bag You should have:  A large overnight bag.  A small leg bag.  Overnight Bag You may wear the overnight bag at any time. Always keep the bag below the level of your bladder but off the floor. When you sleep, put a clean plastic bag in a wastebasket. Then hang the bag inside the wastebasket. Leg Bag Never wear the leg bag at night. Always wear the leg bag below your knee. Keep the leg bag secure with a leg strap or tape. How to care for your skin  Clean the skin around the catheter at least once every day.  Shower every day. Do not take baths.  Put creams, lotions, or ointments on your genital area only as told by your doctor.  Do not use powders, sprays, or lotions on your genital area. How to clean your catheter and your skin 1. Wash your hands with soap and water. 2. Wet a washcloth in warm water and gentle (mild) soap. 3. Use the washcloth to clean the skin where the catheter enters your body. Clean downward and wipe away from the catheter in small circles. Do not wipe toward the catheter. 4. Pat the area dry with a clean towel. Make sure to clean off all soap. How to care for your drainage bags Empty your drainage bag when it is ?- full or at least 2-3 times a day. Replace your drainage bag once a month or sooner if it starts to smell bad or look dirty. Do not clean your drainage bag unless told by your doctor. Emptying a drainage bag  Supplies Needed  Rubbing alcohol.  Gauze pad or cotton ball.  Tape or a leg strap.  Steps 1. Wash your hands with soap and water. 2. Separate (detach) the bag from your leg. 3. Hold the bag over  the toilet or a clean container. Keep the bag below your hips and bladder. This stops pee (urine) from going back into the tube. 4. Open the pour spout at the bottom of the bag. 5. Empty the pee into the toilet or container. Do not let the pour spout touch any surface. 6. Put rubbing alcohol on a gauze pad or cotton ball. 7. Use the gauze pad or cotton ball to clean the pour spout. 8. Close the pour spout. 9. Attach the bag to your leg with tape or a leg strap. 10. Wash your hands.  Changing a drainage bag Supplies Needed  Alcohol wipes.  A clean drainage bag.  Adhesive tape or a leg strap.  Steps 1. Wash your hands with soap and water. 2. Separate the dirty bag from your leg. 3. Pinch the rubber catheter with your fingers so that pee does not spill out. 4. Separate the catheter tube from the drainage tube where these tubes connect (at the connection valve). Do not let the tubes touch any surface. 5. Clean the end of the catheter tube with an alcohol wipe. Use a different alcohol wipe to clean the end of the drainage tube. 6. Connect the catheter tube to the drainage tube of the clean bag. 7. Attach the new bag to  the leg with adhesive tape or a leg strap. 8. Wash your hands.  How to prevent infection and other problems  Never pull on your catheter or try to remove it. Pulling can damage tissue in your body.  Always wash your hands before and after touching your catheter.  If a leg strap gets wet, replace it with a dry one.  Drink enough fluids to keep your pee clear or pale yellow, or as told by your doctor.  Do not let the drainage bag or tubing touch the floor.  Wear cotton underwear.  If you are male, wipe from front to back after you poop (have a bowel movement).  Check on the catheter often to make sure it works and the tubing is not twisted. Get help if:  Your pee is cloudy.  Your pee smells unusually bad.  Your pee is not draining into the bag.  Your  tube gets clogged.  Your catheter starts to leak.  Your bladder feels full. Get help right away if:  You have redness, swelling, or pain where the catheter enters your body.  You have fluid, pus, or a bad smell coming from the area where the catheter enters your body.  The area where the catheter enters your body feels warm.  You have a fever.  You have pain in your: ? Stomach (abdomen). ? Legs. ? Lower back. ? Bladder.  You see blood fill the catheter.  Your pee is pink or red.  You feel sick to your stomach (nauseous).  You throw up (vomit).  You have chills.  Your catheter gets pulled out. This information is not intended to replace advice given to you by your health care provider. Make sure you discuss any questions you have with your health care provider. Document Released: 09/03/2012 Document Revised: 04/06/2016 Document Reviewed: 10/22/2013 Elsevier Interactive Patient Education  2018 [place] de sonda urinaria permanente en adultos Indwelling Urinary Catheter Care, Adult Cuide bien de su sonda para que funcione de [place] correcta y para Warden/ranger. Cmo usar su sonda Sujete la sonda a su pierna con cinta (cinta adhesiva) o una correa de pierna. Asegrese de no ajustarla demasiado. Si [place] cinta, quite los restos de cinta que hayan quedado sobre la sonda. Cmo usar una bolsa de drenaje Usted debera tener:  Una bolsa grande para usar de noche.  Una bolsa pequea para la pierna.  Bolsa para usar de noche La bolsa para la noche se Facilities manager. Mantenga la bolsa de drenaje por debajo del nivel de la vejiga, pero mantngala separada del suelo. A la hora de dormir, coloque una bolsa de plstico limpia en una papelera. Luego, cuelgue la bolsa adentro. Bolsa para la pierna No use nunca la bolsa para la pierna durante la noche. Use siempre la bolsa de pierna por debajo de su rodilla. Mantenga la bolsa de pierna fija con una  correa de pierna o cinta. Cmo cuidar su piel  Limpie la piel que est alrededor de la sonda al menos una vez por da.  Dchese a diario. No tome baos de inmersin.  Colquese cremas, lociones o ungentos en la zona genital nicamente segn lo indicado por su mdico.  No use talcos, aerosoles ni lociones en la zona genital. Cmo limpiar la sonda y su piel 1. Lvese las manos con agua y [place]. 2. Moje un pao con agua tibia y jabn suave. 3. Use el pao para limpiar la parte de la piel donde la  sonda entra en el cuerpo. Limpie hacia abajo, alejndose de la sonda en pequeos crculos. No limpie hacia la sonda. 4. Seque bien el rea con una toalla limpia dando golpecitos. Asegrese de [employer] restos de [place]. Cmo cuidar sus bolsas de drenaje Vace la bolsa de drenaje cuando se haya llenado hasta una tercera parte o hasta la mitad, o por lo menos 2 o 3veces al [employer]. Cambie la bolsa de drenaje una vez por mes o antes, si empieza a tener mal olor o est sucia. No limpie la bolsa de drenaje a menos que as se lo indique su mdico. Cmo vaciar la bolsa de drenaje  Materiales necesarios  Alcohol para fricciones.  Apsito de gasa o bola de algodn.  Cinta o correa de pierna.  Pasos 1. Lvese las manos con agua y [place]. 2. Separe (desprenda) la bolsa de su pierna. 3. Sostenga la bolsa sobre el inodoro o un recipiente limpio. Mantenga la bolsa por debajo de sus caderas y vejiga. Esto impide que el pis (orina) vuelva a Public house manager sonda. 4. Abra el pico vertedor que est en el fondo de la [place]. 5. Vace la orina en el inodoro o recipiente. No permita que el pico vertedor toque ninguna superficie. 6. Coloque alcohol para fricciones en el apsito de gasa o la bola de algodn. 7. Use el apsito de gasa o la bola de algodn para Tax inspector. [place] vertedor. 9. Sujete la bolsa a su pierna con cinta o una correa de pierna. [place].  Cmo  cambiar la bolsa de drenaje Materiales necesarios  Toallitas impregnadas en alcohol.  Una bolsa de drenaje limpia.  Cinta adhesiva o correa de pierna.  Pasos 1. Lvese las manos con agua y [place]. 2. Separe la bolsa sucia de su pierna. 3. Comprima la sonda de goma con sus dedos para que la orina no se derrame. 4. Separe la sonda del tubo de drenaje en donde se conectan (en la vlvula de conexin). No permita que la sonda o el tubo toque ninguna superficie. 5. Limpie el extremo de la sonda con un pao con alcohol. Use otro pao con alcohol para limpiar el extremo del tubo de drenaje. 6. Conecte la sonda al tubo de drenaje de la bolsa limpia. 7. Sujete la nueva bolsa a su pierna con cinta adhesiva o una correa de pierna. 8. Lvese las manos.  Cmo prevenir una infeccin y otros problemas  [place] tire de la sonda o trate de [place]. Al tirar, podran daarse los tejidos de su cuerpo.  Lvese siempre las manos antes y despus de tocar la sonda.  Si se moja la correa de pierna, cmbiela por una seca.  Beba suficiente lquido para Consulting civil engineer orina clara o de color [place] plido, o segn lo indique su mdico.  No permita que la bolsa de drenaje ni la sonda toquen el suelo.  Use ropa interior de algodn.  Si es [place], higiencese desde adelante hacia atrs despus de ir de cuerpo (mover el intestino).  Revise la sonda con frecuencia para asegurarse de que funcione y que no est enroscada. Solicite ayuda si:  Su orina es turbia.  Su orina huele inusualmente mal.  Su orina no drena dentro de la bolsa.  La sonda se tapa.  La sonda empieza a tener filtraciones.  Siente que su vejiga est llena. Solicite ayuda de inmediato si:  Tiene enrojecimiento, hinchazn o dolor donde la sonda ingresa en su cuerpo.  Tiene lquido, pus o mal olor que salen del rea donde la sonda entra en su cuerpo.  El rea donde la sonda entra en su cuerpo se siente clida.  Tiene fiebre.  Siente  dolor en: ? El estmago (abdomen). ? Las piernas. ? La parte inferior de la espalda. ? La vejiga.  La sonda se llena con sangre.  La orina es de color rosa o rojo.  Siente malestar estomacal (nuseas).  Vomita.  Tiene escalofros.  La sonda se ha salido Energy manager. Esta informacin no tiene Marine scientist el consejo del mdico. Asegrese de hacerle al mdico cualquier pregunta que tenga. Document Released: 09/03/2012 Document Revised: 04/20/2016 Document Reviewed: 10/22/2013 Elsevier Interactive Patient Education  [employer].

## 2018-03-06 NOTE — Anesthesia Postprocedure Evaluation (Signed)
Anesthesia Post Note  Patient: Danny Walker  Procedure(s) Performed: TRANSURETHRAL RESECTION OF THE PROSTATE (TURP) (N/A Prostate)     Patient location during evaluation: PACU Anesthesia Type: General Level of consciousness: awake and alert Pain management: pain level controlled Vital Signs Assessment: post-procedure vital signs reviewed and stable Respiratory status: spontaneous breathing, nonlabored ventilation, respiratory function stable and patient connected to nasal cannula oxygen Cardiovascular status: blood pressure returned to baseline and stable Postop Assessment: no apparent nausea or vomiting Anesthetic complications: no    Last Vitals:  Vitals:   03/06/18 0032 03/06/18 0331  BP: (!) 92/54 (!) 97/56  Pulse: 78 71  Resp: 15 16  Temp: 36.9 C 36.9 C  SpO2: 97% 97%    Last Pain:  Vitals:   03/06/18 0448  TempSrc:   PainSc: 0-No pain                 Ryan P Ellender

## 2018-03-08 NOTE — Discharge Summary (Signed)
Physician Discharge Summary  Patient ID: Danny Walker MRN: [id number] DOB/AGE: 71/06/1945 71 y.o.  Admit date: 03/05/2018 Discharge date: 03/06/2018  Admission Diagnoses: BPH Discharge Diagnoses:  Active Problems:   BPH (benign prostatic hyperplasia)   Discharged Condition: good  Hospital Course: The patient tolerated the procedure well and was transferred to the floor on IV pain meds, IV fluid. On POD#1 the pt was started on regular diet and they ambulated in the halls.  Prior to discharge the pt was tolerating a regular diet, pain was controlled on PO pain meds, they were ambulating without difficulty, and they had normal bowel function.  Consults: None  Significant Diagnostic Studies: none  Treatments: surgery: TURP  Discharge Exam: Blood pressure 133/68, pulse 76, temperature 97.9 F (36.6 C), resp. rate 15, height 5\' 11"  (1.803 m), weight 62 kg, SpO2 100 %. General appearance: alert, cooperative and appears stated age Head: Normocephalic, without obvious abnormality, atraumatic Neck: no adenopathy, no carotid bruit, no JVD, supple, symmetrical, trachea midline and thyroid not enlarged, symmetric, no tenderness/mass/nodules Resp: clear to auscultation bilaterally Cardio: regular rate and rhythm, S1, S2 normal, no murmur, click, rub or gallop GI: soft, non-tender; bowel sounds normal; no masses,  no organomegaly Extremities: extremities normal, atraumatic, no cyanosis or edema Neurologic: Grossly normal  Disposition: Discharge disposition: 01-Home or Self Care       Discharge Instructions    Discharge patient   Complete by:  As directed    Discharge disposition:  01-Home or Self Care   Discharge patient date:  03/06/2018     Allergies as of 03/06/2018   No Known Allergies     Medication List    STOP taking these medications   RAPAFLO 8 MG Caps capsule Generic drug:  silodosin   VESICARE 10 MG tablet Generic drug:  solifenacin     TAKE these  medications   ferrous sulfate 325 (65 FE) MG tablet Take 325 mg by mouth daily.   finasteride 5 MG tablet Commonly known as:  PROSCAR Take 5 mg by mouth at bedtime.   gabapentin 400 MG capsule Commonly known as:  NEURONTIN Take 400 mg by mouth 2 (two) times daily.   ibuprofen 800 MG tablet Commonly known as:  ADVIL,MOTRIN Take 800 mg by mouth daily.   lisinopril 20 MG tablet Commonly known as:  PRINIVIL,ZESTRIL Take 20 mg by mouth every morning.   omeprazole 40 MG capsule Commonly known as:  PRILOSEC Take 40 mg by mouth every morning.   oxyCODONE-acetaminophen 5-325 MG tablet Commonly known as:  PERCOCET/ROXICET Take 1-2 tablets by mouth every 4 (four) hours as needed for moderate pain.   promethazine 25 MG tablet Commonly known as:  PHENERGAN Take 25 mg by mouth 3 (three) times daily as needed for nausea.   Vitamin D (Ergocalciferol) 50000 units Caps capsule Commonly known as:  DRISDOL Take 50,000 Units by mouth every Monday.      Follow-up Information    McKenzie, Candee Furbish, MD. Call on 03/09/2018.   Specialty:  Urology Contact information: [address] [place] [address] [phone]           Signed: [name] 03/08/2018, 8:42 AM

## 2018-03-31 ENCOUNTER — Encounter (HOSPITAL_COMMUNITY): Payer: Self-pay | Admitting: Emergency Medicine

## 2018-03-31 ENCOUNTER — Emergency Department (HOSPITAL_COMMUNITY)
Admission: EM | Admit: 2018-03-31 | Discharge: 2018-03-31 | Disposition: A | Discharge status: Home / Self Care | Payer: Medicare Other | Source: Home / Self Care | Attending: Emergency Medicine | Admitting: Emergency Medicine

## 2018-03-31 ENCOUNTER — Emergency Department (HOSPITAL_COMMUNITY): Payer: Medicare Other

## 2018-03-31 ENCOUNTER — Other Ambulatory Visit: Payer: Self-pay

## 2018-03-31 DIAGNOSIS — Z79899 Other long term (current) drug therapy: Secondary | ICD-10-CM

## 2018-03-31 DIAGNOSIS — I10 Essential (primary) hypertension: Secondary | ICD-10-CM | POA: Insufficient documentation

## 2018-03-31 DIAGNOSIS — R1310 Dysphagia, unspecified: Secondary | ICD-10-CM

## 2018-03-31 DIAGNOSIS — K224 Dyskinesia of esophagus: Secondary | ICD-10-CM | POA: Diagnosis not present

## 2018-03-31 DIAGNOSIS — G7000 Myasthenia gravis without (acute) exacerbation: Secondary | ICD-10-CM | POA: Diagnosis not present

## 2018-03-31 DIAGNOSIS — R634 Abnormal weight loss: Secondary | ICD-10-CM | POA: Insufficient documentation

## 2018-03-31 LAB — COMPREHENSIVE METABOLIC PANEL
ALT: 17 U/L (ref 0–44)
AST: 18 U/L (ref 15–41)
Albumin: 3.7 g/dL (ref 3.5–5.0)
Alkaline Phosphatase: 62 U/L (ref 38–126)
Anion gap: 10 (ref 5–15)
BUN: 12 mg/dL (ref 8–23)
CO2: 23 mmol/L (ref 22–32)
Calcium: 9.5 mg/dL (ref 8.9–10.3)
Chloride: 105 mmol/L (ref 98–111)
Creatinine, Ser: 1.06 mg/dL (ref 0.61–1.24)
GFR calc Af Amer: >60 mL/min (ref >60)
GFR calc non Af Amer: >60 mL/min (ref >60)
Glucose, Bld: 104 mg/dL — ABNORMAL HIGH (ref 70–99)
Potassium: 3.7 mmol/L (ref 3.5–5.1)
Sodium: 138 mmol/L (ref 135–145)
Total Bilirubin: 0.7 mg/dL (ref 0.3–1.2)
Total Protein: 6.6 g/dL (ref 6.5–8.1)

## 2018-03-31 LAB — LIPASE, BLOOD: Lipase: 46 U/L (ref 11–51)

## 2018-03-31 LAB — CBC WITH DIFFERENTIAL/PLATELET
Abs Immature Granulocytes: 0.01 10*3/uL (ref 0.00–0.07)
Basophils Absolute: 0 10*3/uL (ref 0.0–0.1)
Basophils Relative: 0 %
Eosinophils Absolute: 0 10*3/uL (ref 0.0–0.5)
Eosinophils Relative: 0 %
HCT: 38.2 % — ABNORMAL LOW (ref 39.0–52.0)
Hemoglobin: 11.8 g/dL — ABNORMAL LOW (ref 13.0–17.0)
Immature Granulocytes: 0 %
Lymphocytes Relative: 23 %
Lymphs Abs: 1.2 10*3/uL (ref 0.7–4.0)
MCH: 29.7 pg (ref 26.0–34.0)
MCHC: 30.9 g/dL (ref 30.0–36.0)
MCV: 96.2 fL (ref 80.0–100.0)
Monocytes Absolute: 0.3 10*3/uL (ref 0.1–1.0)
Monocytes Relative: 6 %
Neutro Abs: 3.7 10*3/uL (ref 1.7–7.7)
Neutrophils Relative %: 71 %
Platelets: 329 10*3/uL (ref 150–400)
RBC: 3.97 MIL/uL — ABNORMAL LOW (ref 4.22–5.81)
RDW: 13.2 % (ref 11.5–15.5)
WBC: 5.2 10*3/uL (ref 4.0–10.5)
nRBC: 0 % (ref 0.0–0.2)

## 2018-03-31 MED ORDER — SODIUM CHLORIDE 0.9 % IV BOLUS
1000.0000 mL | Freq: Once | INTRAVENOUS | Status: AC
  Administered 2018-03-31: 1000 mL via INTRAVENOUS

## 2018-03-31 NOTE — Discharge Instructions (Addendum)
You were evaluated in the emergency room for progressive difficulty swallowing and weight loss.  You had lab work and x-rays and we are making arrangements for you to follow-up with gastroenterology as an outpatient for possible endoscopy.  Please continue to try to drink as much as possible and use supplement shakes for calories.  If you are unable to tolerate liquids please return to the emergency department.

## 2018-03-31 NOTE — ED Provider Notes (Signed)
[facility] EMERGENCY DEPARTMENT Provider Note   CSN: [id number] Arrival date & time: 03/31/18  2229     History   Chief Complaint Chief Complaint  Patient presents with  . Dysphagia    >48 hours    HPI Danny Walker is a 72 y.o. male.  He is brought in by his wife for increased difficulty swallowing.  It sounds like he had his dentures for about 2 or 3 months with his dental procedure to remove the remaining teeth, and since then has had difficulty taking any solids.  He sometimes will feel a catch in his throat and spits it back up.  He has had weight loss of over 20 pounds during this time.  The wife says he has gone to the PCP for this and has been put on medicine for GERD.  Today he had difficulty even swallowing liquids and could not take his medicines and so she brought him to the emergency department.  He is complaining of a foreign body sensation in his throat.  He feels he may have a cracker stuck there.  She does not feel there would be anything significant as far as food in his throat as he took very little and he cuts it up very fine.  The history is provided by the patient and the spouse. The history is limited by a language barrier. A language interpreter was used (wife - spanish).  Sore Throat  This is a new problem. The current episode started 1 to 2 hours ago. The problem occurs constantly. The problem has not changed since onset.Associated symptoms include shortness of breath. Pertinent negatives include no chest pain, no abdominal pain and no headaches. The symptoms are aggravated by eating. Nothing relieves the symptoms. He has tried nothing for the symptoms. The treatment provided no relief.    Past Medical History:  Diagnosis Date  . BPH (benign prostatic hyperplasia)   . DDD (degenerative disc disease), lumbosacral   . Fatty liver   . Foley catheter in place   . History of gallstones   . History of prostatitis   . Hypertension   . IDA  (iron deficiency anemia)   . Liver mass 09/17/2001   4.7cm , noted on [place] abd  . Low back pain   . Nocturia   . 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 Spondylosis Lumbar  . Umbilical hernia   . Urinary retention 02/08/2018    Patient Active Problem List   Diagnosis Date Noted  . BPH (benign prostatic hyperplasia) 03/05/2018  . Umbilical hernia 79/89/2119    Past Surgical History:  Procedure Laterality Date  . ERCP W/ SPHICTEROTOMY  09/17/2001  . INSERTION OF MESH N/A 02/05/2016   Procedure: INSERTION OF MESH, UMBILICAL HERNIA;  Surgeon: Armandina Gemma, MD;  Location: [place];  Service: General;  Laterality: N/A;  . LAPAROSCOPIC CHOLECYSTECTOMY  09/22/2001  . TRANSURETHRAL RESECTION OF PROSTATE N/A 03/05/2018   Procedure: TRANSURETHRAL RESECTION OF THE PROSTATE (TURP);  Surgeon: Cleon Gustin, MD;  Location: [facility];  Service: Urology;  Laterality: N/A;  . UMBILICAL HERNIA REPAIR N/A 02/05/2016   Procedure: UMBILICAL HERNIA REPAIR WITH MESH PATCH;  Surgeon: Armandina Gemma, MD;  Location: [place];  Service: General;  Laterality: N/A;        Home Medications    Prior to Admission medications   Medication Sig Start Date End Date Taking? Authorizing  Provider  ferrous sulfate 325 (65 FE) MG tablet Take 325 mg by mouth daily. 12/19/17   [provider]  finasteride (PROSCAR) 5 MG tablet Take 5 mg by mouth at bedtime.     [provider]  gabapentin (NEURONTIN) 400 MG capsule Take 400 mg by mouth 2 (two) times daily. 11/13/17   [provider]  ibuprofen (ADVIL,MOTRIN) 800 MG tablet Take 800 mg by mouth daily. 01/09/18   [provider]  lisinopril (PRINIVIL,ZESTRIL) 20 MG tablet Take 20 mg by mouth every morning.  01/29/18   [provider]  omeprazole (PRILOSEC) 40 MG capsule Take 40 mg by mouth every morning.  01/17/18   [provider]    oxyCODONE-acetaminophen (PERCOCET/ROXICET) 5-325 MG tablet Take 1-2 tablets by mouth every 4 (four) hours as needed for moderate pain. 03/06/18   McKenzie, Candee Furbish, MD  promethazine (PHENERGAN) 25 MG tablet Take 25 mg by mouth 3 (three) times daily as needed for nausea. 01/17/18   [provider]  Vitamin D, Ergocalciferol, (DRISDOL) 50000 units CAPS capsule Take 50,000 Units by mouth every Monday. 01/21/18   [provider]    Family History History reviewed. No pertinent family history.  Social History Social History   Tobacco Use  . Smoking status: Never Smoker  . Smokeless tobacco: Never Used  Substance Use Topics  . Alcohol use: No  . Drug use: Never     Allergies   Patient has no known allergies.   Review of Systems Review of Systems  Constitutional: Positive for appetite change and unexpected weight change. Negative for fever.  HENT: Positive for sore throat and trouble swallowing.   Eyes: Negative for visual disturbance.  Respiratory: Positive for shortness of breath. Negative for cough.   Cardiovascular: Negative for chest pain.  Gastrointestinal: Negative for abdominal pain.  Genitourinary: Negative for dysuria.  Musculoskeletal: Negative for back pain.  Skin: Negative for rash.  Neurological: Negative for headaches.     Physical Exam Updated Vital Signs BP 131/81 (BP Location: Right Arm)   Pulse 96   Temp 97.8 F (36.6 C) (Axillary)   Resp (!) 24   Ht 5\' 2"  (1.575 m)   Wt 58.2 kg   SpO2 100%   BMI 23.47 kg/m   Physical Exam  Constitutional: He appears cachectic. No distress.  HENT:  Head: Normocephalic and atraumatic.  Right Ear: External ear normal.  Left Ear: External ear normal.  Nose: Nose normal.  Mouth/Throat: Oropharynx is clear and moist. No oropharyngeal exudate.  Eyes: Pupils are equal, round, and reactive to light. Conjunctivae and EOM are normal.  Neck: Neck supple. No tracheal deviation present.  Cardiovascular:  Normal rate, regular rhythm and normal heart sounds.  No murmur heard. Pulmonary/Chest: Effort normal and breath sounds normal. No respiratory distress. He has no wheezes. He has no rales.  Abdominal: Soft. He exhibits no mass. There is no tenderness. There is no guarding.  Musculoskeletal: He exhibits no edema, tenderness or deformity.  Lymphadenopathy:    He has no cervical adenopathy.  Neurological: He is alert.  Skin: Skin is warm and dry. Capillary refill takes less than 2 seconds. He is not diaphoretic.  Psychiatric: He has a normal mood and affect.  Nursing note and vitals reviewed.    ED Treatments / Results  Labs (all labs ordered are listed, but only abnormal results are displayed) Labs Reviewed  COMPREHENSIVE METABOLIC PANEL - Abnormal; Notable for the following components:      Result Value  Glucose, Bld 104 (*)    All other components within normal limits  CBC WITH DIFFERENTIAL/PLATELET - Abnormal; Notable for the following components:   RBC 3.97 (*)    Hemoglobin 11.8 (*)    HCT 38.2 (*)    All other components within normal limits  LIPASE, BLOOD    EKG EKG Interpretation  Date/Time:  Saturday March 31 2018 09:32:49 EST Ventricular Rate:  99 PR Interval:    QRS Duration: 95 QT Interval:  353 QTC Calculation: 453 R Axis:   179 Text Interpretation:  Sinus arrhythmia Right axis deviation similar pattern to prior 9/19 Confirmed by Aletta Edouard [id number]) on 03/31/2018 9:52:07 AM   Radiology Dg Neck Soft Tissue  Result Date: 03/31/2018 CLINICAL DATA:  Pt's family states pt has has been unable to eat foods or drink liquids x a couple months now.Pt's family states it has worsened today and the food is just coming back up. Pt's family states he has lost a lot of weight due to this. No hx of swallowing study or esophageal stretch EXAM: NECK SOFT TISSUES - 1+ VIEW COMPARISON:  None. FINDINGS: There is no evidence of retropharyngeal soft tissue swelling or  epiglottic enlargement. The cervical airway is unremarkable and no radio-opaque foreign body identified. Mild degenerative changes noted along the lower cervical spine with anterior endplate osteophytes. IMPRESSION: No soft tissue abnormality.  Widely patent airway. Electronically Signed   By: Lajean Manes M.D.   On: 03/31/2018 11:27   Dg Chest 2 View  Result Date: 03/31/2018 CLINICAL DATA:  Patient with inability to be food or drink liquids. EXAM: CHEST - 2 VIEW COMPARISON:  None. FINDINGS: Monitoring leads overlie the patient. Stable cardiomegaly. No large area pulmonary consolidation. No pleural effusion or pneumothorax. Thoracic spine degenerative changes. Rightward curvature of the lower thoracic and upper lumbar spine. IMPRESSION: No active cardiopulmonary disease. Electronically Signed   By: Lovey Newcomer M.D.   On: 03/31/2018 11:28    Procedures Procedures (including critical care time)  Medications Ordered in ED Medications  sodium chloride 0.9 % bolus 1,000 mL (has no administration in time range)     Initial Impression / Assessment and Plan / ED Course  I have reviewed the triage vital signs and the nursing notes.  Pertinent labs & imaging results that were available during my care of the patient were reviewed by me and considered in my medical decision making (see chart for details).  Clinical Course as of Mar 31 1546  Sat Mar 31, 2018  0951 Patient was asking to take some water here and was successful in that.  No stridor.  I have ordered some basic labs and starting some IV fluids.  Also getting a chest x-ray and a soft tissue neck.   [MB]  1213 Discussed with [facility] who would have the office call him on Monday with an appointment to set up an EGD.   [MB]  7782 I reviewed the plan with the patient and his wife.  As he is able to take liquids now I do not feel he needs emergent admission.  It sounds like GI would be able to do a scope next week.  They understand to return if  he is unable to swallow liquids.   [MB]    Clinical Course User Index [MB] Hayden Rasmussen, MD      Final Clinical Impressions(s) / ED Diagnoses   Final diagnoses:  Dysphagia, unspecified type  Weight loss, non-intentional    ED Discharge  Orders    None       Hayden Rasmussen, MD 03/31/18 [phone]

## 2018-03-31 NOTE — ED Triage Notes (Signed)
Pt Family reports unable to swallow  Solids or liquids. Pt  Tried to drink liquids this Am.  Pt went to PCP on  Tuesday this week  With no DX.  Pt had new dentures  A several months ago  . Family  Reports Pt has had a weight loss  With in last 2 months. Pt is AO .

## 2018-04-02 ENCOUNTER — Emergency Department (HOSPITAL_COMMUNITY): Payer: Medicare Other

## 2018-04-02 ENCOUNTER — Inpatient Hospital Stay (HOSPITAL_COMMUNITY)
Admission: EM | Admit: 2018-04-02 | Discharge: 2018-04-12 | DRG: 056 | Disposition: A | Discharge status: Home / Self Care | Payer: Medicare Other | Attending: Internal Medicine | Admitting: Internal Medicine

## 2018-04-02 ENCOUNTER — Encounter (HOSPITAL_COMMUNITY): Payer: Self-pay | Admitting: Emergency Medicine

## 2018-04-02 DIAGNOSIS — M25572 Pain in left ankle and joints of left foot: Secondary | ICD-10-CM | POA: Diagnosis present

## 2018-04-02 DIAGNOSIS — I10 Essential (primary) hypertension: Secondary | ICD-10-CM | POA: Diagnosis present

## 2018-04-02 DIAGNOSIS — R634 Abnormal weight loss: Secondary | ICD-10-CM | POA: Diagnosis not present

## 2018-04-02 DIAGNOSIS — N179 Acute kidney failure, unspecified: Secondary | ICD-10-CM | POA: Diagnosis present

## 2018-04-02 DIAGNOSIS — G1229 Other motor neuron disease: Secondary | ICD-10-CM | POA: Diagnosis present

## 2018-04-02 DIAGNOSIS — H532 Diplopia: Secondary | ICD-10-CM | POA: Diagnosis not present

## 2018-04-02 DIAGNOSIS — H5712 Ocular pain, left eye: Secondary | ICD-10-CM | POA: Diagnosis not present

## 2018-04-02 DIAGNOSIS — R1310 Dysphagia, unspecified: Secondary | ICD-10-CM

## 2018-04-02 DIAGNOSIS — N400 Enlarged prostate without lower urinary tract symptoms: Secondary | ICD-10-CM | POA: Diagnosis present

## 2018-04-02 DIAGNOSIS — Z6822 Body mass index (BMI) 22.0-22.9, adult: Secondary | ICD-10-CM

## 2018-04-02 DIAGNOSIS — Z4659 Encounter for fitting and adjustment of other gastrointestinal appliance and device: Secondary | ICD-10-CM

## 2018-04-02 DIAGNOSIS — G7000 Myasthenia gravis without (acute) exacerbation: Secondary | ICD-10-CM | POA: Diagnosis present

## 2018-04-02 DIAGNOSIS — R627 Adult failure to thrive: Secondary | ICD-10-CM | POA: Diagnosis present

## 2018-04-02 DIAGNOSIS — Z9049 Acquired absence of other specified parts of digestive tract: Secondary | ICD-10-CM

## 2018-04-02 DIAGNOSIS — Z9079 Acquired absence of other genital organ(s): Secondary | ICD-10-CM

## 2018-04-02 DIAGNOSIS — Z79899 Other long term (current) drug therapy: Secondary | ICD-10-CM | POA: Diagnosis not present

## 2018-04-02 DIAGNOSIS — D649 Anemia, unspecified: Secondary | ICD-10-CM | POA: Diagnosis not present

## 2018-04-02 DIAGNOSIS — B370 Candidal stomatitis: Secondary | ICD-10-CM | POA: Diagnosis present

## 2018-04-02 DIAGNOSIS — E43 Unspecified severe protein-calorie malnutrition: Secondary | ICD-10-CM | POA: Diagnosis present

## 2018-04-02 DIAGNOSIS — R471 Dysarthria and anarthria: Secondary | ICD-10-CM | POA: Diagnosis present

## 2018-04-02 DIAGNOSIS — Z972 Presence of dental prosthetic device (complete) (partial): Secondary | ICD-10-CM | POA: Diagnosis not present

## 2018-04-02 DIAGNOSIS — K760 Fatty (change of) liver, not elsewhere classified: Secondary | ICD-10-CM | POA: Diagnosis present

## 2018-04-02 DIAGNOSIS — G527 Disorders of multiple cranial nerves: Secondary | ICD-10-CM | POA: Diagnosis present

## 2018-04-02 DIAGNOSIS — K224 Dyskinesia of esophagus: Secondary | ICD-10-CM | POA: Diagnosis present

## 2018-04-02 DIAGNOSIS — R1312 Dysphagia, oropharyngeal phase: Secondary | ICD-10-CM | POA: Diagnosis not present

## 2018-04-02 DIAGNOSIS — R42 Dizziness and giddiness: Secondary | ICD-10-CM | POA: Diagnosis not present

## 2018-04-02 DIAGNOSIS — R52 Pain, unspecified: Secondary | ICD-10-CM

## 2018-04-02 DIAGNOSIS — Z7952 Long term (current) use of systemic steroids: Secondary | ICD-10-CM | POA: Diagnosis not present

## 2018-04-02 DIAGNOSIS — Z98818 Other dental procedure status: Secondary | ICD-10-CM | POA: Diagnosis not present

## 2018-04-02 LAB — BASIC METABOLIC PANEL
Anion gap: 7 (ref 5–15)
BUN: 13 mg/dL (ref 8–23)
CO2: 26 mmol/L (ref 22–32)
Calcium: 9.7 mg/dL (ref 8.9–10.3)
Chloride: 105 mmol/L (ref 98–111)
Creatinine, Ser: 1.05 mg/dL (ref 0.61–1.24)
GFR calc Af Amer: >60 mL/min (ref >60)
GFR calc non Af Amer: >60 mL/min (ref >60)
Glucose, Bld: 103 mg/dL — ABNORMAL HIGH (ref 70–99)
Potassium: 3.9 mmol/L (ref 3.5–5.1)
Sodium: 138 mmol/L (ref 135–145)

## 2018-04-02 LAB — CBC WITH DIFFERENTIAL/PLATELET
Basophils Absolute: 0 10*3/uL (ref 0.0–0.1)
Basophils Relative: 0 %
Eosinophils Absolute: 0 10*3/uL (ref 0.0–0.5)
Eosinophils Relative: 0 %
HCT: 39.1 % (ref 39.0–52.0)
Hemoglobin: 12.3 g/dL — ABNORMAL LOW (ref 13.0–17.0)
Lymphocytes Relative: 21 %
Lymphs Abs: 1.1 10*3/uL (ref 0.7–4.0)
MCH: 30.5 pg (ref 26.0–34.0)
MCHC: 31.5 g/dL (ref 30.0–36.0)
MCV: 97 fL (ref 80.0–100.0)
Monocytes Absolute: 0.3 10*3/uL (ref 0.1–1.0)
Monocytes Relative: 6 %
Neutro Abs: 3.7 10*3/uL (ref 1.7–7.7)
Neutrophils Relative %: 73 %
Platelets: 296 10*3/uL (ref 150–400)
RBC: 4.03 MIL/uL — ABNORMAL LOW (ref 4.22–5.81)
RDW: 13.2 % (ref 11.5–15.5)
WBC: 5 10*3/uL (ref 4.0–10.5)
nRBC: 0 % (ref 0.0–0.2)
nRBC: 0 /100 WBC

## 2018-04-02 MED ORDER — ENOXAPARIN SODIUM 40 MG/0.4ML ~~LOC~~ SOLN
40.0000 mg | SUBCUTANEOUS | Status: DC
  Administered 2018-04-02 – 2018-04-11 (×8): 40 mg via SUBCUTANEOUS
  Filled 2018-04-02 (×9): qty 0.4

## 2018-04-02 MED ORDER — LISINOPRIL 20 MG PO TABS
20.0000 mg | ORAL_TABLET | Freq: Every morning | ORAL | Status: DC
  Administered 2018-04-02 – 2018-04-05 (×2): 20 mg via ORAL
  Filled 2018-04-02 (×3): qty 1

## 2018-04-02 MED ORDER — FLUCONAZOLE IN SODIUM CHLORIDE 400-0.9 MG/200ML-% IV SOLN
400.0000 mg | Freq: Once | INTRAVENOUS | Status: AC
  Administered 2018-04-02: 400 mg via INTRAVENOUS
  Filled 2018-04-02: qty 200

## 2018-04-02 MED ORDER — POLYVINYL ALCOHOL 1.4 % OP SOLN
1.0000 [drp] | OPHTHALMIC | Status: DC | PRN
  Administered 2018-04-02 – 2018-04-08 (×5): 1 [drp] via OPHTHALMIC
  Filled 2018-04-02 (×2): qty 15

## 2018-04-02 MED ORDER — ACETAMINOPHEN 650 MG RE SUPP: 650.0000 mg | Freq: Four times a day (QID) | RECTAL | Status: DC | PRN

## 2018-04-02 MED ORDER — FINASTERIDE 5 MG PO TABS
5.0000 mg | ORAL_TABLET | ORAL | Status: DC
  Administered 2018-04-05 – 2018-04-12 (×6): 5 mg via ORAL
  Filled 2018-04-02 (×8): qty 1

## 2018-04-02 MED ORDER — FLUCONAZOLE IN SODIUM CHLORIDE 200-0.9 MG/100ML-% IV SOLN
200.0000 mg | INTRAVENOUS | Status: DC
  Administered 2018-04-03 – 2018-04-08 (×6): 200 mg via INTRAVENOUS
  Filled 2018-04-02 (×6): qty 100

## 2018-04-02 MED ORDER — ACETAMINOPHEN 325 MG PO TABS
650.0000 mg | ORAL_TABLET | Freq: Four times a day (QID) | ORAL | Status: DC | PRN
  Administered 2018-04-05: 650 mg via ORAL
  Filled 2018-04-02: qty 2

## 2018-04-02 MED ORDER — DEXTROSE-NACL 5-0.45 % IV SOLN
INTRAVENOUS | Status: DC
  Administered 2018-04-02: 23:00:00 via INTRAVENOUS

## 2018-04-02 MED ORDER — DEXTROSE-NACL 5-0.45 % IV SOLN
INTRAVENOUS | Status: DC
  Administered 2018-04-02: 07:00:00 via INTRAVENOUS

## 2018-04-02 MED ORDER — SENNOSIDES-DOCUSATE SODIUM 8.6-50 MG PO TABS: 1.0000 | ORAL_TABLET | Freq: Every evening | ORAL | Status: DC | PRN

## 2018-04-02 NOTE — ED Provider Notes (Signed)
[facility] EMERGENCY DEPARTMENT Provider Note   CSN: [id number] Arrival date & time: 04/02/18  0707     History   Chief Complaint Chief Complaint  Patient presents with  . Dysphagia  . Failure To Thrive    HPI Rafal Archuleta is a 72 y.o. male.  The history is provided by the patient and a friend. No language interpreter was used.   Matas Burrows is a 72 y.o. male who presents to the Emergency Department complaining of difficulty swallowing. He presents a company by a friend for evaluation of difficulty swallowing that began on Saturday. He has been having difficulty swallowing solid foods for several weeks. He is been unable to swallow liquids since this weekend. He did temporarily have improvement in his swallowing a few days ago when he was evaluated in the emergency department but has not been able to swallow any saliva or liquid since yesterday. He is been using a towel to dry his mouth. He is experienced significant weight loss over the last few months. No fevers, pain. He does feel weak and dizzy and has decreased urinary output. Last urination was yesterday. He has been unable to swallow his home meds for the last two days. He was scheduled to follow-up with [facility] but has been unable to make the appointments. Past Medical History:  Diagnosis Date  . BPH (benign prostatic hyperplasia)   . DDD (degenerative disc disease), lumbosacral   . Fatty liver   . Foley catheter in place   . History of gallstones   . History of prostatitis   . Hypertension   . IDA (iron deficiency anemia)   . Liver mass 09/17/2001   4.7cm , noted on [place] abd  . Low back pain   . Nocturia   . 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 Spondylosis Lumbar  . Umbilical hernia   . Urinary retention 02/08/2018    Patient Active Problem List   Diagnosis Date Noted  . Dysphagia 04/02/2018  . BPH (benign prostatic hyperplasia) 03/05/2018  .  Umbilical hernia 71/69/6789    Past Surgical History:  Procedure Laterality Date  . ERCP W/ SPHICTEROTOMY  09/17/2001  . INSERTION OF MESH N/A 02/05/2016   Procedure: INSERTION OF MESH, UMBILICAL HERNIA;  Surgeon: Armandina Gemma, MD;  Location: [place];  Service: General;  Laterality: N/A;  . LAPAROSCOPIC CHOLECYSTECTOMY  09/22/2001  . TRANSURETHRAL RESECTION OF PROSTATE N/A 03/05/2018   Procedure: TRANSURETHRAL RESECTION OF THE PROSTATE (TURP);  Surgeon: Cleon Gustin, MD;  Location: [facility];  Service: Urology;  Laterality: N/A;  . UMBILICAL HERNIA REPAIR N/A 02/05/2016   Procedure: UMBILICAL HERNIA REPAIR WITH MESH PATCH;  Surgeon: Armandina Gemma, MD;  Location: [place];  Service: General;  Laterality: N/A;        Home Medications    Prior to Admission medications   Medication Sig Start Date End Date Taking? Authorizing Provider  ferrous sulfate 325 (65 FE) MG tablet Take 325 mg by mouth daily. 12/19/17   [provider]  finasteride (PROSCAR) 5 MG tablet Take 5 mg by mouth every morning.     [provider]  gabapentin (NEURONTIN) 400 MG capsule Take 400 mg by mouth 2 (two) times daily. 11/13/17   [provider]  ibuprofen (ADVIL,MOTRIN) 800 MG tablet Take 800 mg by mouth daily as needed for mild pain.  01/09/18   [provider]  lisinopril (PRINIVIL,ZESTRIL) 20 MG tablet Take 20 mg by mouth every morning.  01/29/18   [provider]  omeprazole (PRILOSEC) 40 MG capsule Take 40 mg by mouth every morning.  01/17/18   [provider]  oxyCODONE-acetaminophen (PERCOCET/ROXICET) 5-325 MG tablet Take 1-2 tablets by mouth every 4 (four) hours as needed for moderate pain. 03/06/18   McKenzie, Candee Furbish, MD  promethazine (PHENERGAN) 25 MG tablet Take 25 mg by mouth 3 (three) times daily as needed for nausea. 01/17/18   [provider]  Vitamin D, Ergocalciferol, (DRISDOL) 50000  units CAPS capsule Take 50,000 Units by mouth every Monday. 01/21/18   [provider]    Family History History reviewed. No pertinent family history.  Social History Social History   Tobacco Use  . Smoking status: Never Smoker  . Smokeless tobacco: Never Used  Substance Use Topics  . Alcohol use: No  . Drug use: Never     Allergies   Patient has no known allergies.   Review of Systems Review of Systems  All other systems reviewed and are negative.    Physical Exam Updated Vital Signs BP (!) 158/74 (BP Location: Right Arm)   Pulse (!) 59   Temp 97.7 F (36.5 C) (Oral)   Resp (!) 23   SpO2 98%   Physical Exam  Constitutional: He is oriented to person, place, and time. He appears well-developed.  thin  HENT:  Head: Normocephalic and atraumatic.  Mouth/Throat: Oropharynx is clear and moist.  Cardiovascular: Normal rate and regular rhythm.  No murmur heard. Pulmonary/Chest: Effort normal and breath sounds normal. No respiratory distress.  Abdominal: Soft. There is no tenderness. There is no rebound and no guarding.  Musculoskeletal: He exhibits no edema or tenderness.  Neurological: He is alert and oriented to person, place, and time.  5/5 strength in all four extremities.    Skin: Skin is warm and dry.  Psychiatric: He has a normal mood and affect. His behavior is normal.  Nursing note and vitals reviewed.    ED Treatments / Results  Labs (all labs ordered are listed, but only abnormal results are displayed) Labs Reviewed  BASIC METABOLIC PANEL - Abnormal; Notable for the following components:      Result Value   Glucose, Bld 103 (*)    All other components within normal limits  CBC WITH DIFFERENTIAL/PLATELET - Abnormal; Notable for the following components:   RBC 4.03 (*)    Hemoglobin 12.3 (*)    All other components within normal limits  HIV ANTIBODY (ROUTINE TESTING W REFLEX)    EKG None  Radiology Dg Esophagus  Result Date:  04/02/2018 CLINICAL DATA:  Difficulty swallowing, including saliva since tooth extraction 2-3 months ago. Shortness of breath and weight loss. EXAM: ESOPHOGRAM/BARIUM SWALLOW TECHNIQUE: Single contrast examination was performed using  thin barium. FLUOROSCOPY TIME:  Fluoroscopy Time: 1 minutes and 4 seconds of low-dose pulsed fluoroscopy Radiation Exposure Index (if provided by the fluoroscopic device): NA Number of Acquired Spot Images: 0 COMPARISON:  Neck and chest radiograph same date. FINDINGS: Study was initiated with the patient in the semi erect, left posterior oblique position. In this position, the patient was able to only swallow small quantities of barium. The patient became short of breath and quite agitated, both limiting the study. No aspiration or focal esophageal abnormality was identified. The patient was then placed in a chair, and the esophagus was evaluated in the lateral projection. In this position, the patient was  more tolerant of the study. Laryngeal penetration was noted, although there was no frank aspiration. There is mild esophageal dysmotility with a decreased primary stripping wave. No focal mucosal ulceration or stricture was identified. A barium tablet was not administered as the patient was unable to tolerate. IMPRESSION: 1. The study is limited by the patient's limited mobility, limited ability to swallow and shortness of breath. 2. Laryngeal penetration without aspiration. Further evaluation by speech therapy recommended. 3. Mild esophageal dysmotility. No focal mucosal lesion, high-grade stricture or esophageal obstruction. Electronically Signed   By: Richardean Sale M.D.   On: 04/02/2018 10:23    Procedures Procedures (including critical care time)  Medications Ordered in ED Medications  lisinopril (PRINIVIL,ZESTRIL) tablet 20 mg (has no administration in time range)  finasteride (PROSCAR) tablet 5 mg (has no administration in time range)  enoxaparin (LOVENOX)  injection 40 mg (has no administration in time range)  acetaminophen (TYLENOL) tablet 650 mg (has no administration in time range)    Or  acetaminophen (TYLENOL) suppository 650 mg (has no administration in time range)  senna-docusate (Senokot-S) tablet 1 tablet (has no administration in time range)  dextrose 5 %-0.45 % sodium chloride infusion ( Intravenous Rate/Dose Change 04/02/18 1346)  fluconazole (DIFLUCAN) IVPB 400 mg (has no administration in time range)  fluconazole (DIFLUCAN) IVPB 200 mg (has no administration in time range)  polyvinyl alcohol (LIQUIFILM TEARS) 1.4 % ophthalmic solution 1 drop (has no administration in time range)     Initial Impression / Assessment and Plan / ED Course  I have reviewed the triage vital signs and the nursing notes.  Pertinent labs & imaging results that were available during my care of the patient were reviewed by me and considered in my medical decision making (see chart for details).  Clinical Course as of Apr 03 1627  Mon Apr 02, 2018  3300 D/w Dr. Watt Climes with [facility] - recommends barium swallow.   [ER]    Clinical Course User Index [ER] Quintella Reichert, MD    Patient here for evaluation of difficulty swallowing for the last several months, unable to tolerate liquids for the last few days. He is chronically ill appearing on evaluation. Barium study is limited but patient did have difficulty with swallowing and was at risk for aspiration. Medicine consulted for admission for further evaluation and treatment.  Final Clinical Impressions(s) / ED Diagnoses   Final diagnoses:  None    ED Discharge Orders    None       Quintella Reichert, MD 04/02/18 [phone]

## 2018-04-02 NOTE — ED Notes (Signed)
Pt's PCP is [facility] on [address] in [place]. Family is trying to get him into the clinic here at [facility].

## 2018-04-02 NOTE — Progress Notes (Addendum)
Patient c/o unable to air in air. Nurse notified MD. They are coming to him. Nurse will continue to monitor.

## 2018-04-02 NOTE — ED Triage Notes (Signed)
Per daughter in law, pt has had trouble swallowing for approx 1 week. Seen on Saturday for same. Reported weight loss.

## 2018-04-02 NOTE — H&P (Signed)
Date: 04/02/2018               Patient Name:  Danny Walker MRN: [id number]  DOB: [birth year] Age / Sex: 72 y.o., male   PCP: Patient, No Pcp Per         Medical Service: Internal Medicine Teaching Service         Attending Physician: Dr. Rebeca Alert, Raynaldo Opitz, MD    First Contact: Dr. Koleen Distance  Pager: [phone]  Second Contact: Dr. Tarri Abernethy Pager: [phone]       After Hours (After 5p/  First Contact Pager: [phone]  weekends / holidays): Second Contact Pager: [phone]   Chief Complaint: difficulty swallowing liquids and solids  History of Present Illness: [name] 72 year old male with a past medical history of benign prostatic hyperplasia s/p TURP, edentulous and hypertension that presents to the emergency department today for dysphasia.  Patient states that 3 months ago he had his teeth removed and dentures placed. Since that time patient has noticed progressive dysphagia. Initially he had difficulty swallowing solids, feeling that his food would get stuck in his throat and would often regurgitate his food. For the past week he has been unable to swallow liquids. He has associated symptoms of nausea and 20lb weight loss in the past 3 months.  He has tried omeprazole without benefit.  He denies surgery or radiation to his neck.  He was seen in the ED on 11/9 for these symptoms with the plan to follow-up with GI. Unfortunately, he reports unable to swallow liquids over the weekend and after feeling dizzy this morning he came into the ED.  He denies fever/chills,shortness of breath, chest pain, or abdominal pain.    Meds: Lisinopril and Finasteride  No outpatient medications have been marked as taking for the 04/02/18 encounter [facility] Encounter).     Allergies: Allergies as of 04/02/2018  . (No Known Allergies)   Past Medical History:  Diagnosis Date  . BPH (benign prostatic hyperplasia)   . DDD (degenerative disc disease), lumbosacral   . Fatty liver   . Foley  catheter in place   . History of gallstones   . History of prostatitis   . Hypertension   . IDA (iron deficiency anemia)   . Liver mass 09/17/2001   4.7cm , noted on [place] abd  . Low back pain   . Nocturia   . 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 Spondylosis Lumbar  . Umbilical hernia   . Urinary retention 02/08/2018    Family History: States he does not know of any medical conditions in mother, father or siblings  Social History: Tobacco use: denies Alcohol use:  Denies Illicit drug use:  Denies  Review of Systems: A complete ROS was negative except as per HPI.   Physical Exam: Blood pressure 132/72, pulse 62, temperature 98.1 F (36.7 C), temperature source Oral, resp. rate (!) 35, SpO2 100 %. Physical Exam  Constitutional: No distress.  Soft spoken  HENT:  Head: Normocephalic and atraumatic.  Oropharynx with thrush  Eyes: Pupils are equal, round, and reactive to light. Conjunctivae and EOM are normal.  Neck: Normal range of motion. Neck supple.  Cardiovascular: Normal rate, regular rhythm and normal heart sounds. Exam reveals no gallop and no friction rub.  No murmur heard. Pulmonary/Chest: Effort normal and breath sounds normal. No respiratory distress. He has no wheezes. He has no rales.  Abdominal: Soft. He exhibits no distension.  There is no tenderness.  Musculoskeletal: He exhibits no edema.  Neurological: He is alert.  Skin: Skin is warm and dry. He is not diaphoretic.     Assessment & Plan by Problem: Active Problems:   Dysphagia  [name] Is a 72 year old male with past medical history of BPH s/p TURP and hypertension that presents with 3 month history of worsening dysphagia to both solids and liquids. Patient had plain films of the neck soft tissue on 11/9 that showed no soft tissue abnormality.   Dysphagia Per patient history this is likely esophageal dysphasia.   Patient had an esophagram today however, the study was  limited due to patient's ability to swallow. Results showed laryngeal penetration without aspiration. It showed no focal mucosal lesion, high-grade stricture or esophageal obstruction.  Patient was also noted to have oral thrush on exam. 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 Will also consult GI for further evaluation. -IV fluconazole -GI consult - IV D5-1/2 NS -HIV antibody -swallow eval  BPH s/p TURP Patient had TURP procedure on 12/8108 without complications. Will continue finasteride -finasteride   Hypertension Unable to take medications due to symptoms. Normotensive while in the ED. Will restart home lisinopril and patient can take once able to take oral medications. -lisinopril    Dispo: Admit patient to Inpatient with expected length of stay greater than 2 midnights.  Signed: Valinda Party, DO 04/02/2018, 2:08 PM  Pager: [phone]

## 2018-04-02 NOTE — Progress Notes (Signed)
Patient arrived to floor Alert and oriented X 4 Denies Pains. No skin issues. Spanish speaking. Al questions and concerns addressed, Bed in lowest position with Bed alarm on. Call light in reach.

## 2018-04-03 ENCOUNTER — Inpatient Hospital Stay (HOSPITAL_COMMUNITY): Payer: Medicare Other

## 2018-04-03 DIAGNOSIS — B370 Candidal stomatitis: Secondary | ICD-10-CM

## 2018-04-03 DIAGNOSIS — Z9079 Acquired absence of other genital organ(s): Secondary | ICD-10-CM

## 2018-04-03 DIAGNOSIS — I10 Essential (primary) hypertension: Secondary | ICD-10-CM

## 2018-04-03 DIAGNOSIS — R634 Abnormal weight loss: Secondary | ICD-10-CM | POA: Diagnosis present

## 2018-04-03 DIAGNOSIS — N400 Enlarged prostate without lower urinary tract symptoms: Secondary | ICD-10-CM

## 2018-04-03 DIAGNOSIS — Z98818 Other dental procedure status: Secondary | ICD-10-CM

## 2018-04-03 DIAGNOSIS — K224 Dyskinesia of esophagus: Secondary | ICD-10-CM

## 2018-04-03 DIAGNOSIS — R1310 Dysphagia, unspecified: Secondary | ICD-10-CM

## 2018-04-03 DIAGNOSIS — G1229 Other motor neuron disease: Secondary | ICD-10-CM | POA: Diagnosis present

## 2018-04-03 DIAGNOSIS — Z79899 Other long term (current) drug therapy: Secondary | ICD-10-CM

## 2018-04-03 DIAGNOSIS — Z972 Presence of dental prosthetic device (complete) (partial): Secondary | ICD-10-CM

## 2018-04-03 HISTORY — DX: Other motor neuron disease: G12.29

## 2018-04-03 HISTORY — DX: Abnormal weight loss: R63.4

## 2018-04-03 LAB — HIV ANTIBODY (ROUTINE TESTING W REFLEX): HIV Screen 4th Generation wRfx: NONREACTIVE

## 2018-04-03 LAB — CK: Total CK: 73 U/L (ref 49–397)

## 2018-04-03 MED ORDER — LORAZEPAM 2 MG/ML IJ SOLN
1.0000 mg | Freq: Once | INTRAMUSCULAR | Status: AC
  Administered 2018-04-03: 1 mg via INTRAVENOUS
  Filled 2018-04-03: qty 1

## 2018-04-03 MED ORDER — DEXTROSE 5 % IV SOLN
INTRAVENOUS | Status: DC
  Administered 2018-04-03 – 2018-04-04 (×3): via INTRAVENOUS

## 2018-04-03 NOTE — Progress Notes (Signed)
   Subjective: Mr. Eber was seen and evaluated at bedside with the assistance of Spanish interpreter and stepdaughter by phone. Reports progressively worsening dysphagia. Noticed symptoms 3 months ago after having his teeth removed and getting dentures. Initially, he seemed to have difficulty with food getting stuck midway down, and he would eventually regurgitate it. It has progressively worsened to the point that he cannot swallow solids or liquids. Also has trouble managing oral secretions. He endorses significant weight loss over the last month. Describes the sensation of his cheeks and tongue being swollen. Endorses intermittent diplopia, as well as dry eyes. Denies abdominal pain, numbness/tingling, or weakness in any of his extremities.    Objective:  Vital signs in last 24 hours: Vitals:   04/02/18 2326 04/03/18 0401 04/03/18 0733 04/03/18 1247  BP: 119/71 122/72 130/79 (!) 145/88  Pulse: 61 64 74 67  Resp: 20  18 18   Temp: 98.6 F (37 C) 97.9 F (36.6 C) 98.1 F (36.7 C) 98.5 F (36.9 C)  TempSrc: Oral Oral Oral Oral  SpO2: 100% 100% 100% 100%   General: awake, alert, NAD. Appears frail HEENT: conjunctiva clear. repeatedly suctioning oral secretions. White plaques noted on left buccal mucosa and soft palate. No posterior oropharyngeal swelling or plaques.  Neck: no TTP or LAD CV: RRR; no murmurs, rubs or gallops Pulm: normal respiratory effort; lungs CTA bilaterally Abd: BS+; abdomen is soft, non-distended, non-tender Neuro: A&Ox4; appropriate thought content. Dysarthric speech. Follows commands.  Cranial Nerve exam: PEERLA, EOMI, facial sensation intact bilaterally. Able to raise eyebrows, but difficult to fully close eyes; unable to puff cheeks; smile is symmetric; hearing intact bilaterally; tongue protrudes midline; palate does not elevate well.  Strength is 5/5 in bilateral upper and lower extremities. Sensation intact bilaterally.     Assessment/Plan:  Principal  Problem:   Dysphagia Active Problems:   Bulbar weakness (HCC)   Unintentional weight loss   Thrush, oral  1. Dysphagia: findings of cranial nerve palsies on exam makes neurologic etiologies such as brain stem malignancy, MS, GBS or ALS all on differential. Could also be myopathic such as myasthenia gravis, paraneoplastic syndromes, or myositis or myopathy. -  MRI brain and neuro consult - will check CK   - starting D5 and monitor electrolytes for now; will likely need NG tube placed to meet nutritional needs - SLP on board; appreciate their recommendations.  - esophagram showed mild esophageal dysmotility. No focal mucosal lesion, stricture or obstruction. I think neurologic evaluation is higher priority, but may benefit from EGD at some point   2. Thrush  - patchy areas on buccal mucosa and soft palate, but do not appreciate any in posterior oropharynx.  - he denies odynophagia; do not think this explains the severity of his symptoms - will continue treatment with IV fluconazole and transition to oral when he is able   3. HTN:  - unable to take home Lisinopril due to dysphagia - blood pressure stable; will continue to monitor    Dispo: Anticipated discharge in approximately 3-4 day(s).   Modena Nunnery D, DO 04/03/2018, 3:00 PM Pager: [phone]

## 2018-04-03 NOTE — Progress Notes (Signed)
Pt transported off unit to MRI. P. Amo Jailyn Leeson RN 

## 2018-04-03 NOTE — Evaluation (Addendum)
Clinical/Bedside Swallow Evaluation Patient Details  Name: Danny Walker MRN: [id number] Date of Birth: 1946-04-26  Today's Date: 04/03/2018 Time: SLP Start Time (ACUTE ONLY): 0945 SLP Stop Time (ACUTE ONLY): 1015 SLP Time Calculation (min) (ACUTE ONLY): 30 min  Past Medical History:  Past Medical History:  Diagnosis Date  . BPH (benign prostatic hyperplasia)   . DDD (degenerative disc disease), lumbosacral   . Fatty liver   . Foley catheter in place   . History of gallstones   . History of prostatitis   . Hypertension   . IDA (iron deficiency anemia)   . Liver mass 09/17/2001   4.7cm , noted on [place] abd  . Low back pain   . Nocturia   . 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 Spondylosis Lumbar  . Umbilical hernia   . Urinary retention 02/08/2018   Past Surgical History:  Past Surgical History:  Procedure Laterality Date  . ERCP W/ SPHICTEROTOMY  09/17/2001  . INSERTION OF MESH N/A 02/05/2016   Procedure: INSERTION OF MESH, UMBILICAL HERNIA;  Surgeon: Armandina Gemma, MD;  Location: [place];  Service: General;  Laterality: N/A;  . LAPAROSCOPIC CHOLECYSTECTOMY  09/22/2001  . TRANSURETHRAL RESECTION OF PROSTATE N/A 03/05/2018   Procedure: TRANSURETHRAL RESECTION OF THE PROSTATE (TURP);  Surgeon: Cleon Gustin, MD;  Location: [facility];  Service: Urology;  Laterality: N/A;  . UMBILICAL HERNIA REPAIR N/A 02/05/2016   Procedure: UMBILICAL HERNIA REPAIR WITH MESH PATCH;  Surgeon: Armandina Gemma, MD;  Location: [facility];  Service: General;  Laterality: N/A;   HPI:  [name] 72 year old male with a past medical history of benign prostatic hyperplasia s/p TURP, edentulous and hypertension that presents to the emergency department today for dysphasia.  Patient states that 3 months ago he had his teeth removed and dentures placed. Since that time patient has noticed progressive dysphagia. Initially he  had difficulty swallowing solids, feeling that his food would get stuck in his throat and would often regurgitate his food. For the past week he has been unable to swallow liquids. He has associated symptoms of nausea and 20lb weight loss in the past 3 months.  He has tried omeprazole without benefit.  He denies surgery or radiation to his neck.  He was seen in the ED on 11/9 for these symptoms with the plan to follow-up with GI. Unfortunately, he reports unable to swallow liquids over the weekend and after feeling dizzy this morning he came into the ED. MD suspects this is likely esophageal dysphasia.   Patient had an esophagram however, the study was limited due to patient's ability to swallow. Results showed laryngeal penetration without aspiration. It showed no focal mucosal lesion, high-grade stricture or esophageal obstruction.  Patient was also noted to have oral thrush on exam. 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 Neck soft tissue on 11/9 WNL.    Assessment / Plan / Recommendation Clinical Impression  Pt demonstrates a profound oral and oropharyngeal dysphagia much beyond what the effects of an esophageal candida would cause. This therapist is a spanish speaker and pt is immediately noted to be dysarthric, hypernasal. Daughter and pt report that speech intelligiblity fluctuates.  The pt has signs of bilateral weakness of CN VII, X and possibly IX. The pt cannot firmly close eyes and has had dry eye for one month.  He cannot close his lips firmly or pucker.  There no visible palatal elevation. Lingual lateralization and elevation are present, but weak. Swallow attempt results in laryngeal elevation, but no hyoid excursion, thus no swallow trigger. Review of Esophagram shows images of severe pharyngeal stasis, which could suggest poor peristalsis and bolus propulsion, hyoid excursion.  Again all these problems  are highly suggestive of a neuromuscular impairment, possibly a bulbar dysfunction. Called MD and suggested neurologic consult. Pt to be NPO, recommend short term alternate nutrition, will f/u with MBS tomorrow for objective assessment of swallowing.  SLP Visit Diagnosis: Dysphagia, oropharyngeal phase (R13.12)    Aspiration Risk  Severe aspiration risk;Risk for inadequate nutrition/hydration    Diet Recommendation NPO;Alternative means - temporary   Medication Administration: Via alternative means    Other  Recommendations Oral Care Recommendations: Oral care QID   Follow up Recommendations        Frequency and Duration            Prognosis        Swallow Study   General HPI: [name] 72 year old male with a past medical history of benign prostatic hyperplasia s/p TURP, edentulous and hypertension that presents to the emergency department today for dysphasia.  Patient states that 3 months ago he had his teeth removed and dentures placed. Since that time patient has noticed progressive dysphagia. Initially he had difficulty swallowing solids, feeling that his food would get stuck in his throat and would often regurgitate his food. For the past week he has been unable to swallow liquids. He has associated symptoms of nausea and 20lb weight loss in the past 3 months.  He has tried omeprazole without benefit.  He denies surgery or radiation to his neck.  He was seen in the ED on 11/9 for these symptoms with the plan to follow-up with GI. Unfortunately, he reports unable to swallow liquids over the weekend and after feeling dizzy this morning he came into the ED. 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   Patient had an esophagram however, the study was limited due to patient's ability to swallow. Results showed laryngeal penetration without aspiration. It showed no focal mucosal lesion, high-grade stricture or esophageal obstruction.  Patient was also noted to have oral  thrush on exam. 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 Neck soft tissue on 11/9 WNL.  Type of Study: Bedside Swallow Evaluation Diet Prior to this Study: NPO Respiratory Status: Room air History of Recent Intubation: No Behavior/Cognition: Alert;Cooperative;Pleasant mood Oral Cavity Assessment: Within Functional Limits Oral Care Completed by SLP: No Oral Cavity - Dentition: Edentulous Vision: Functional for self-feeding Patient Positioning: Upright in bed Baseline Vocal Quality: Normal Volitional Swallow: Unable to elicit    Oral/Motor/Sensory Function Overall Oral Motor/Sensory Function: Severe impairment Facial ROM: Reduced right;Reduced left;Suspected CN VII (facial) dysfunction Facial Symmetry: Within Functional Limits Facial Strength: Reduced right;Reduced left;Suspected CN VII (facial) dysfunction Lingual ROM: Reduced right;Reduced left Lingual Symmetry: Within Functional Limits Lingual Strength: Reduced;Suspected CN XII (hypoglossal) dysfunction Velum: Impaired right;Impaired left;Suspected CN X (Vagus) dysfunction Mandible: Impaired   Ice Chips Ice chips: Impaired Presentation: Spoon Pharyngeal Phase Impairments: Cough - Immediate;Decreased hyoid-laryngeal movement;Unable to trigger swallow   Thin Liquid Thin Liquid: Not tested    Nectar Thick Nectar Thick Liquid: Not tested   Honey Thick Honey Thick Liquid: Not tested   Puree Puree: Not tested   Solid  Danny Baltimore, MA [facility]  Acute Rehabilitation Services Pager [phone] Office [phone]  Danny Walker, Katherene Ponto 04/03/2018,10:32 AM

## 2018-04-03 NOTE — Progress Notes (Signed)
  Date: 04/03/2018  Patient name: Danny Walker  Medical record number: [id number]  Date of birth: 1945-10-24   I have seen and evaluated this patient and I have discussed the plan of care with the house staff. Please see their note for complete details. I concur with their findings with the following additions/corrections:  Please see my separate attestation from the H&P dated 04/02/2018  Lenice Pressman, M.D., Ph.D. 04/03/2018, 4:12 PM

## 2018-04-04 ENCOUNTER — Encounter (HOSPITAL_COMMUNITY): Payer: Self-pay | Admitting: Neurology

## 2018-04-04 ENCOUNTER — Inpatient Hospital Stay (HOSPITAL_COMMUNITY): Payer: Medicare Other

## 2018-04-04 DIAGNOSIS — H532 Diplopia: Secondary | ICD-10-CM

## 2018-04-04 DIAGNOSIS — E43 Unspecified severe protein-calorie malnutrition: Secondary | ICD-10-CM

## 2018-04-04 DIAGNOSIS — R1312 Dysphagia, oropharyngeal phase: Secondary | ICD-10-CM

## 2018-04-04 LAB — BASIC METABOLIC PANEL
Anion gap: 4 — ABNORMAL LOW (ref 5–15)
BUN: 7 mg/dL — ABNORMAL LOW (ref 8–23)
CO2: 26 mmol/L (ref 22–32)
Calcium: 9.3 mg/dL (ref 8.9–10.3)
Chloride: 108 mmol/L (ref 98–111)
Creatinine, Ser: 1.03 mg/dL (ref 0.61–1.24)
GFR calc Af Amer: >60 mL/min (ref >60)
GFR calc non Af Amer: >60 mL/min (ref >60)
Glucose, Bld: 112 mg/dL — ABNORMAL HIGH (ref 70–99)
Potassium: 3.6 mmol/L (ref 3.5–5.1)
Sodium: 138 mmol/L (ref 135–145)

## 2018-04-04 LAB — TSH: TSH: 1.777 u[IU]/mL (ref 0.350–4.500)

## 2018-04-04 LAB — RPR: RPR Ser Ql: NONREACTIVE

## 2018-04-04 MED ORDER — PYRIDOSTIGMINE BROMIDE 60 MG/5ML PO SOLN
60.0000 mg | Freq: Three times a day (TID) | ORAL | Status: DC
  Administered 2018-04-04 – 2018-04-05 (×4): 60 mg via ORAL
  Filled 2018-04-04 (×6): qty 5

## 2018-04-04 NOTE — Progress Notes (Signed)
SLP Cancellation Note  Patient Details Name: Danny Walker MRN: [id number] DOB: 01/14/1946   Cancelled treatment:       Reason Eval/Treat Not Completed: Other (comment). Planned to do MBS today for objective assessment of swallowing. But given work up for Myasthenia gravis would expect poor outcome with no place for therapeutic exercise given high risk of muscle fatigue. Pt likely to begin treatment for MG and if beneficial may recover some swallowing ability in the short term. For now would recommend placement of Cortrak as they are available today. SLP will follow for signs of clinical improvement and readiness for PO or MBS.   Herbie Baltimore, MA CCC-SLP  Acute Rehabilitation Services Pager [phone] Office [phone]  Lynann Beaver 04/04/2018, 9:49 AM

## 2018-04-04 NOTE — Progress Notes (Signed)
   Subjective: Danny Walker was seen and evaluated at bedside with the help of a Spanish interpreter. Unfortunately, patient was unable to tolerate full MRI due to sensation that he was choking. This morning he says he feels somewhat better. He notes he is able to talk easier and is using the suctioning for his oral secretions less often. Still endorses double vision. Denies new weakness, numbness, or tingling. Discussed plan of evaluation by neurologist to help with further work-up and management which he was in agreement with. Spoke with daughter and gave her updates as well. All questions and concerns were addressed.     Objective:  Vital signs in last 24 hours: Vitals:   04/03/18 1247 04/03/18 1645 04/03/18 2135 04/04/18 0044  BP: (!) 145/88 (!) 124/91 (!) 143/86 132/87  Pulse: 67 64 88 (!) 101  Resp: 18 18 20 20   Temp: 98.5 F (36.9 C) 97.7 F (36.5 C) 97.8 F (36.6 C) (!) 97.5 F (36.4 C)  TempSrc: Oral Oral Oral Oral  SpO2: 100% 100% 96% 100%   General: awake, alert, sitting up in bed in NAD CV: RRR; no murmurs, rubs or gallops Pulm: normal respiratory effort; lungs CTA bilaterally  Abd: BS+; abdomen is soft, non-distended, non-tender Neuro: A&Ox4; dysarthric speech. Cranial nerve exam: EOMI; PERRLA; able to raise eyebrows symmetrically; eyes are easily opened; symmetric facial sensation; unable to puff out cheeks; hearing intact; no elevation of uvula; shoulder shrug intact; tongue is midline.  Motor strength of upper extremities is 5/5 in major muscle groups, 4/5 of intrinsic hand muscles; 5/5 strength in bilateral lower extremities Reflexes: 1+ BLE; 2+ BUE Sensation intact throughout.   Assessment/Plan:  Principal Problem:   Dysphagia Active Problems:   Bulbar weakness (HCC)   Unintentional weight loss   Thrush, oral  1. Dysphagia: findings of cranial nerve palsies on exam. Patient's appears to have coherent speech at first which fatigues with time which fits with  myasthenia clinical picture. Serologic studies are pending. MRI study was limited due to patient's inability to lie flat, but does not show any acute structural pathology to explain symptoms. CK normal which makes myositis less likely. TSH normal.  Patient does not exhibit signs of respiratory distress and is managing oral secretions better than yesterday. Will await neurology evaluation for recommendations before treating empirically.  - SLP on board; appreciate their recommendations; recommending NG tube placement but will hold off pending further neurologic eval. Continue D5 for now and remain NPO.  - esophagram showed mild esophageal dysmotility. No focal mucosal lesion, stricture or obstruction. I think neurologic evaluation is higher priority, but may benefit from EGD at some point   2. Ritta Slot  - will continue treatment with IV fluconazole and transition to oral when he is able   3. HTN:  - unable to take home Lisinopril due to dysphagia - blood pressure stable; will continue to monitor   Dispo: Anticipated discharge in approximately 3-4 day(s).   Modena Nunnery D, DO 04/04/2018, 7:33 AM Pager: [phone]

## 2018-04-04 NOTE — Progress Notes (Signed)
  Date: 04/04/2018  Patient name: Danny Walker  Medical record number: [id number]  Date of birth: 28-Feb-1946   I have seen and evaluated this patient and I have discussed the plan of care with the house staff. Please see their note for complete details. I concur with their findings with the following additions/corrections:   Able to obtain a partial MRI brain overnight.  Unfortunately, it was cut short because he began choking while in the scanner.  Apparently, based on the limited information, there is no evidence of mass or other lesion to explain his symptoms.  Today, his speech and face initially seemed a little improved, but he seemed to fatigue a little bit with continued speaking.  Neuro exam is essentially unchanged from yesterday, with significant weakness in his facial muscles bilaterally, mild ptosis, dysarthric speech.  Today on exam, his intrinsic hand muscles seemed slightly weaker, 4/5, remainder of strength seemed 5/5.  Achilles and patellar reflexes 1+, biceps and brachioradialis reflexes 2+.  We have consulted neurology to determine if the MRI was sufficient to evaluate for a central lesion, and to determine what other work-up as needed.  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  Initial recommendations from neurology indicate myasthenia gravis is of highest concern, we have sent antibody testing.  Given his severe malnutrition, we will try to determine with neurology the prognosis for relatively rapid improvement in swallowing function.  If rapid improvement is unlikely, he will likely need an NG tube for nutrition in the meantime.  We are currently maintaining his hydration and electrolytes with IV fluids.  Lenice Pressman, M.D., Ph.D. 04/04/2018, 4:55 PM

## 2018-04-04 NOTE — Progress Notes (Signed)
Patient was unable to understand the NIF/VC even after it was translated. RT used translating app. Pt could not do NIF. And VC was a .65 L.  Patient did not have good effort.

## 2018-04-04 NOTE — Consult Note (Addendum)
Neurology Consultation  Reason for Consult: Facial muscle weakness and difficulty swallowing Referring Physician: RAINES  CC: As above  History is obtained from: Patient with translation of both speech therapy and nursing student who speaks Spanish- notes  HPI: Danny Walker is a 72 y.o. male past medical history significant for urinary retention, low back pain, liver mass, hypertension, prostatitis, gallstones, fatty liver.  [facility]  About 3 months ago he had his teeth removed and received dentures.  However patient states that approximately 2 weeks ago he had a progressive onset of difficulty swallowing followed by difficulty with facial expression and closing his eyes.  He has lost approximately 30 pounds in weight over the 46-month  He admits to having diplopia when watching TV that is vertical.  She does admit that the weakness does progress with continuous use of muscles and as the day goes on   ED course:- EKG and labs   Chart review-patient has not seen a neurologist and there is no other notes in the electronic chart  ROS:  ROS was performed and is negative except as noted in the HPI.   Past Medical History:  Diagnosis Date  . BPH (benign prostatic hyperplasia)   . DDD (degenerative disc disease), lumbosacral   . Fatty liver   . Foley catheter in place   . History of gallstones   . History of prostatitis   . Hypertension   . IDA (iron deficiency anemia)   . Liver mass 09/17/2001   4.7cm , [place]  . Low back pain   . Nocturia   . Renal cyst, 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   1.3 cm simple, noted on [place]  . Spondylosis Lumbar  . Umbilical hernia   . Urinary retention 02/08/2018    Family History  Problem Relation Age of Onset  . Hypertension Mother   . Hypertension Father     Social History:   reports that he has never smoked. He has never used smokeless tobacco. He reports that he does not drink alcohol or use  drugs.  Medications  Current Facility-Administered Medications:  .  acetaminophen (TYLENOL) tablet 650 mg, 650 mg, Oral, Q6H PRN **OR** acetaminophen (TYLENOL) suppository 650 mg, 650 mg, Rectal, Q6H PRN, Hoffman, Jessica Ratliff, DO .  dextrose 5 % solution, , Intravenous, Continuous, Helberg, Justin, MD, Last Rate: 75 mL/hr at 04/04/18 0200 .  enoxaparin (LOVENOX) injection 40 mg, 40 mg, Subcutaneous, Q24H, Hoffman, Jessica Ratliff, DO, 40 mg at 04/03/18 1728 .  finasteride (PROSCAR) tablet 5 mg, 5 mg, Oral, BH-q7a, Hoffman, Jessica Ratliff, DO .  fluconazole (DIFLUCAN) IVPB 200 mg, 200 mg, Intravenous, Q24H, Hoffman, Jessica Ratliff, DO, Stopped at 04/03/18 1223 .  lisinopril (PRINIVIL,ZESTRIL) tablet 20 mg, 20 mg, Oral, q morning - 10a, Hoffman, Jessica Ratliff, DO, 20 mg at 04/02/18 1656 .  polyvinyl alcohol (LIQUIFILM TEARS) 1.4 % ophthalmic solution 1 drop, 1 drop, Both Eyes, PRN, ROda Kilts MD, 1 drop at 04/03/18 1010 .  senna-docusate (Senokot-S) tablet 1 tablet, 1 tablet, Oral, QHS PRN, HValinda Party DO  Exam: Current vital signs: BP 136/83 (BP Location: Left Arm)   Pulse 75   Temp 98.4 F (36.9 C) (Oral)   Resp 18   SpO2 100%  Vital signs in last 24 hours: Temp:  [97.5 F (36.4 C)-98.5 F (36.9 C)] 98.4 F (36.9 C) (11/13 0733) Pulse Rate:  [64-101] 75 (11/13 0733) Resp:  [18-20] 18 (11/13 0733) BP: (  124-145)/(83-91) 136/83 (11/13 0733) SpO2:  [96 %-100 %] 100 % (11/13 0733)  Physical Exam  Constitutional: Appears well-developed Psych: Affect appropriate to situation Eyes: No scleral injection HENT: No OP obstrucion Head: Normocephalic.  Cardiovascular: Normal rate and regular rhythm.  Respiratory: Effort normal, non-labored breathing GI: Soft.  No distension. There is no tenderness.  Skin: WDI  Neuro: Mental Status: Patient is awake, alert, oriented to person, place, month, year, and situation. Patient is able to give a clear and  coherent history. Dysarthria* Cranial Nerves: II: Visual Fields are full. III,IV, VI: EOMI without ptosis but positive diploplia.  Pupils are equal, round, and reactive to light.  When touching eyes tightly they are easily opened, strength of jaw or resistance is weak V: Facial sensation is symmetric to temperature VII: Facial movement is symmetric.  VIII: hearing is intact to voice X: No elevation of uvula XI: Shoulder shrug is symmetric.  Head flexion is extremely weak against resistance, head extension is stronger but still weak XII: tongue is midline without atrophy or fasciculations.  Motor: Tone is normal. Bulk is normal. 5/5 strength was present in all four extremities.-Currently able to take a deep breath and count to 15 which would equal 1.5 L of vital capacity Sensory: Sensation is symmetric to light touch and temperature in the arms and legs. Deep Tendon Reflexes: 2+ and symmetric in the biceps and patellae.  Plantars: Toes are downgoing bilaterally.  Cerebellar: FNF and HKS are intact bilaterally  Labs I have reviewed labs in epic and the results pertinent to this consultation are:   CBC    Component Value Date/Time   WBC 5.0 04/02/2018 0725   RBC 4.03 (L) 04/02/2018 0725   HGB 12.3 (L) 04/02/2018 0725   HCT 39.1 04/02/2018 0725   PLT 296 04/02/2018 0725   MCV 97.0 04/02/2018 0725   MCV 97.1 (A) 01/01/2013 1050   MCH 30.5 04/02/2018 0725   MCHC 31.5 04/02/2018 0725   RDW 13.2 04/02/2018 0725   LYMPHSABS 1.1 04/02/2018 0725   MONOABS 0.3 04/02/2018 0725   EOSABS 0.0 04/02/2018 0725   BASOSABS 0.0 04/02/2018 0725    CMP     Component Value Date/Time   NA 138 04/04/2018 0423   K 3.6 04/04/2018 0423   CL 108 04/04/2018 0423   CO2 26 04/04/2018 0423   GLUCOSE 112 (H) 04/04/2018 0423   BUN 7 (L) 04/04/2018 0423   CREATININE 1.03 04/04/2018 0423   CREATININE 0.77 01/01/2013 1045   CALCIUM 9.3 04/04/2018 0423   PROT 6.6 03/31/2018 1004   ALBUMIN 3.7  03/31/2018 1004   AST 18 03/31/2018 1004   ALT 17 03/31/2018 1004   ALKPHOS 62 03/31/2018 1004   BILITOT 0.7 03/31/2018 1004   GFRNONAA >60 04/04/2018 0423   GFRAA >60 04/04/2018 0423    Lipid Panel  No results found for: CHOL, TRIG, HDL, CHOLHDL, VLDL, LDLCALC, LDLDIRECT   Imaging I have reviewed the images obtained:  MRI examination of the brain- MRI was performed however they had to discontinue examination due to patient choking on saliva.  There was no acute intracranial abnormalities.  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  Etta Quill PA-C [facility] [phone]  M-F  (9:00 am- 5:00 PM)  04/04/2018, 9:40 AM     Assessment:  72 year old male with 2 months of progressive dysphagia, dysarthria, double vision, inability to handle secretions at times.  On examination no tongue fasciculations noted, jaw jerk absent.   Impression:  Progressive dysphagia, bulbar  weakness - D/D include Myasthenia gravis, motor neuron disease  Recommendations: - Vital capacity and NIF every 12 hours - Barium swallow per speech therapy -- Ordered Ach receptor antibodies for MG, ESR, CRP - Close monitoring for worsening of symptoms --  Mestinon trial -- Will need EMG/NCS for more definitve diagnosis   NEUROHOSPITALIST ADDENDUM Performed a face to face diagnostic evaluation.   I have reviewed the contents of history and physical exam as documented by PA/ARNP/Resident and agree with above documentation.  I have discussed and formulated the above plan as documented. Edits to the note have been made as needed.  49 y male admitted for progressive dysphagia.  No obvious ptosis on examination.  No tongue fasciculations noted, jaw jerk absent.  Does have bilateral facial weakness as well as neck flexor weakness.  Appears to have some difficulty with respiration.  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  Primary team working up for cancer, if present consider paraneoplastic. MRI brain is negative although not done with contrast this patient not able to stay in the lying position  Plan as stated above with edits made.  We will start with Mestinon trial, if patient does not respond then he may need  plasmapheresis or IVIG -or transfer to [facility] for inpatient EMG as it is not possible to do as inpatient.      Karena Addison Giabella Duhart MD [facility] [phone]   If 7pm to 7am, please call on call as listed on AMION.

## 2018-04-05 LAB — CBC
HCT: 34 % — ABNORMAL LOW (ref 39.0–52.0)
Hemoglobin: 10.9 g/dL — ABNORMAL LOW (ref 13.0–17.0)
MCH: 30.4 pg (ref 26.0–34.0)
MCHC: 32.1 g/dL (ref 30.0–36.0)
MCV: 95 fL (ref 80.0–100.0)
Platelets: 268 10*3/uL (ref 150–400)
RBC: 3.58 MIL/uL — ABNORMAL LOW (ref 4.22–5.81)
RDW: 13.1 % (ref 11.5–15.5)
WBC: 6.6 10*3/uL (ref 4.0–10.5)
nRBC: 0 % (ref 0.0–0.2)

## 2018-04-05 LAB — BASIC METABOLIC PANEL
Anion gap: 7 (ref 5–15)
BUN: <5 mg/dL — ABNORMAL LOW (ref 8–23)
CO2: 28 mmol/L (ref 22–32)
Calcium: 9.3 mg/dL (ref 8.9–10.3)
Chloride: 105 mmol/L (ref 98–111)
Creatinine, Ser: 0.85 mg/dL (ref 0.61–1.24)
GFR calc Af Amer: >60 mL/min (ref >60)
GFR calc non Af Amer: >60 mL/min (ref >60)
Glucose, Bld: 102 mg/dL — ABNORMAL HIGH (ref 70–99)
Potassium: 3.5 mmol/L (ref 3.5–5.1)
Sodium: 140 mmol/L (ref 135–145)

## 2018-04-05 LAB — IGG: IgG (Immunoglobin G), Serum: 710 mg/dL (ref 700–1600)

## 2018-04-05 MED ORDER — PANTOPRAZOLE SODIUM 40 MG PO TBEC
40.0000 mg | DELAYED_RELEASE_TABLET | Freq: Every day | ORAL | Status: DC
  Administered 2018-04-05: 40 mg via ORAL
  Filled 2018-04-05 (×2): qty 1

## 2018-04-05 MED ORDER — PREDNISONE 20 MG PO TABS
20.0000 mg | ORAL_TABLET | Freq: Every day | ORAL | Status: DC
  Filled 2018-04-05: qty 1

## 2018-04-05 NOTE — Care Management Important Message (Signed)
Important Message  Patient Details  Name: Danny Walker MRN: [id number] Date of Birth: 01-Jul-1945   Medicare Important Message Given:  Yes    Rhianon Zabawa 04/05/2018, 2:50 PM

## 2018-04-05 NOTE — Progress Notes (Signed)
Nif and VC performed per MD order with translator on video chat. Pt obtained NIF of -19cmH2O and VC of 2.57L with good effort.

## 2018-04-05 NOTE — Progress Notes (Signed)
NIF and VC performed per physician's orders.  NIF  -20 VC  2.35

## 2018-04-05 NOTE — Progress Notes (Signed)
Patient C/o Left foot pain. He said  it just started today. He has  developed a limp because of if. Nurse treated with Tylenol earlier. He said it really didn't help. Nurse paged MD , waiting for call back and orders.

## 2018-04-05 NOTE — Progress Notes (Signed)
  Speech Language Pathology Treatment: Dysphagia  Patient Details Name: Danny Walker MRN: [id number] DOB: [birth year] Today's Date: 04/05/2018 Time: 1007-1219 SLP Time Calculation (min) (ACUTE ONLY): 30 min  Assessment / Plan / Recommendation Clinical Impression  Pt is dramatically improved today. Speech is 100% intelligible. Pt able to consume 6 oz of water and 4 oz of puree with adequate swallow trigger (previously absent). No complaint of residue or coughing. There is still concern for fatigue given diagnosis and ongoing report that pt feels worse in the evening. Will initiate a dys 1 (puree) diet with thin liquids with close supervision. Discussed concerns with RN, but pt also has very good awareness of capabilities with swallowing. Expect ongoing improvements with appropriate medical management. Hopeful to avoid Cortrak at this point since it still has not been placed despite recommendation.    HPI HPI: Danny Walker 72 year old male with a past medical history of benign prostatic hyperplasia s/p TURP, edentulous and hypertension that presents to the emergency department today for dysphasia.  Patient states that 3 months ago he had his teeth removed and dentures placed. Since that time patient has noticed progressive dysphagia. Initially he had difficulty swallowing solids, feeling that his food would get stuck in his throat and would often regurgitate his food. For the past week he has been unable to swallow liquids. He has associated symptoms of nausea and 20lb weight loss in the past 3 months.  He has tried omeprazole without benefit.  He denies surgery or radiation to his neck.  He was seen in the ED on 11/9 for these symptoms with the plan to follow-up with GI. Unfortunately, he reports unable to swallow liquids over the weekend and after feeling dizzy this morning he came into the ED. 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   Patient had an esophagram however, the study  was limited due to patient's ability to swallow. Results showed laryngeal penetration without aspiration. It showed no focal mucosal lesion, high-grade stricture or esophageal obstruction.  Patient was also noted to have oral thrush on exam. 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 Neck soft tissue on 11/9 WNL.       SLP Plan  Continue with current plan of care       Recommendations  Diet recommendations: Dysphagia 1 (puree);Thin liquid Liquids provided via: Cup;Straw Medication Administration: Crushed with puree Supervision: Patient able to self feed Compensations: Slow rate;Small sips/bites Postural Changes and/or Swallow Maneuvers: Seated upright 90 degrees                Oral Care Recommendations: Oral care BID Follow up Recommendations: 24 hour supervision/assistance Plan: Continue with current plan of care       GO                Darrien Belter, Katherene Ponto 04/05/2018, 9:25 AM

## 2018-04-05 NOTE — Progress Notes (Signed)
   Subjective: Danny Walker was seen and evaluated at bedside with assistance of Spanish translator. He has significantly improved since yesterday, particularly his speech and swallowing. He had already been evaluated by speech earlier this morning and was started on a soft diet. Still endorses some pain in his cheeks and throat, as well as intermittent diplopia. He also notes that his neck muscles still feel weak. Endorses light headedness with standing. Explained that this will likely be the case for a while since he has not had adequate nutrition for quite some time. Discussed plan to continue treatment for myasthenia gravis and have PT evaluate at him while he's here.    Objective:  Vital signs in last 24 hours: Vitals:   04/04/18 2346 04/05/18 0521 04/05/18 0810 04/05/18 1258  BP: 132/80 118/71 111/67 127/72  Pulse: 71 61 68 94  Resp: 18 18 18 18   Temp: (!) 97.5 F (36.4 C) 98.6 F (37 C) 98 F (36.7 C) 98 F (36.7 C)  TempSrc: Oral Oral Oral Oral  SpO2: 100% 98% 99% 95%   General: awake, alert, sitting up in bed in NAD CV: RRR; no murmurs, rubs or gallops Pulm: normal respiratory effort; lungs CTA bilaterally Neuro: A&Ox4; speech is fluent and comprehendible. Cranial nerves: PEERLA; EOMI; able to keep his eyelids closed tightly; smile is symmetric, able to puff cheeks; facial sensation intact; improved rise in uvula; bilateral shoulder shrug; tongue protrudes midline   Assessment/Plan:  Principal Problem:   Dysphagia Active Problems:   Bulbar weakness (HCC)   Unintentional weight loss   Thrush, oral  1. 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  - he has been able to swallow working with speech and cleared for dysphagia 1 diet  - continue pyridostigmine 60 mg q 8 - as he continues to improve and can tolerate lying flat, will obtain chest CT to evaluate for thymoma  - continue  monitoring vital capacity and NIF q 12  2. Thrush  - continue treatment with IV fluconazole and transition to oral as his swallowing continues to improve   3. HTN:  - unable to take home Lisinopril due to dysphagia - blood pressure stable; will continue to monitor  Dispo: Anticipated discharge in approximately 2-3 day(s).   Modena Nunnery D, DO 04/05/2018, 1:10 PM Pager: [phone]

## 2018-04-05 NOTE — Progress Notes (Signed)
  Date: 04/05/2018  Patient name: Danny Walker  Medical record number: [id number]  Date of birth: 11-07-1945   I have seen and evaluated this patient and I have discussed the plan of care with the house staff. Please see their note for complete details. I concur with their findings with the following additions/corrections:   Started on pyridostigmine empiric therapy for presumed myasthenia gravis.  Today, he shows remarkable improvement.  His speech is more clear and understandable, he did much better with speech therapy and was cleared to start eating dysphagia 1 diet and thin liquids.  On exam, he has much improved strength in his eyelid closure, smile, cheek puffing, palatal elevation, and tongue movements.  His response to pyridostigmine has essentially confirmed the diagnosis of myasthenia gravis.  We will continue treatment with this and follow his clinical course.  Hopefully he will be able to resume adequate nutritional intake, but if not, we will reassess how to get him the nutrition he needs.  He currently has significant orthostatic dizziness, likely related to unintentionally restricted eating and severe malnutrition.  This will take some time to recover.  Once his symptoms have improved and he is able to lie flat better, he will need either CT or MRI to evaluate for thymoma.  We will continue IV fluconazole for oral thrush.  Lenice Pressman, M.D., Ph.D. 04/05/2018, 2:50 PM

## 2018-04-05 NOTE — Progress Notes (Addendum)
Subjective: Patient has improved significantly.  He still is Spanish-speaking so must use interpreter.  But on exam he has improved and he feels improved.  He states that he still feels his cheeks are little bit heavy however he is no longer seeing diplopia and speech has given him a diet.  Speech therapist speaks Spanish, and states "patient is dramatically improved today.  Speech is 100% intelligible.  Patient able to consume 6 ounces of water and 4 ounces of pure with adequate swallow trigger.  No complaints of residual cough.  There is still concern for fatigue given diagnosis and patient does feel worse in the evening."  Per speech patient has been placed on a dysphagia 1 pured diet  Of note patient is only received 2 doses of Mestinon at this point in time.  Exam: Vitals:   04/05/18 0521 04/05/18 0810  BP: 118/71 111/67  Pulse: 61 68  Resp: 18 18  Temp: 98.6 F (37 C) 98 F (36.7 C)  SpO2: 98% 99%    Physical Exam   HEENT-  Normocephalic, no lesions, without obvious abnormality.  Normal external eye and conjunctiva.   Extremities- Warm, dry and intact Musculoskeletal-no joint tenderness, deformity or swelling Skin-warm and dry, no hyperpigmentation, vitiligo, or suspicious lesions    Neuro: - Exam was done via interpretation Mental Status: Alert, oriented, thought content appropriate.  Speech fluent .  Able to follow 3 step commands without difficulty. Cranial Nerves: II:  Visual fields grossly normal,  III,IV, VI: ptosis not present, extra-ocular motions intact bilaterally pupils equal, round, reactive to light and accommodation-today when attempting to open his eyelids eyelid closure was very strong V,VII: smile symmetric, facial light touch sensation normal bilaterally VIII: hearing normal bilaterally IX,X: uvula rises midline- yesterday he had no rise in uvula, today he has improved and minimal rise XI: bilateral shoulder shrug--patient said flexion is stronger and  improved. XII: midline tongue extension Motor: Right : Upper extremity   5/5    Left:     Upper extremity   5/5  Lower extremity   5/5     Lower extremity   5/5 Tone and bulk:normal tone throughout; no atrophy noted Respiratory had difficulty getting their calculation secondary to language barrier.  However patient is able to count to 20 with a single breath. Sensory: Pinprick and light touch intact throughout, bilaterally Deep Tendon Reflexes: 2+ and symmetric throughout Plantars: Right: downgoing   Left: downgoing Cerebellar: normal finger-to-nose, normal rapid alternating movements and normal heel-to-shin test Gait: normal gait and station    Medications:  Scheduled: . enoxaparin (LOVENOX) injection  40 mg Subcutaneous Q24H  . finasteride  5 mg Oral BH-q7a  . lisinopril  20 mg Oral q morning - 10a  . pyridostigmine  60 mg Oral Q8H   Continuous: . dextrose 75 mL/hr at 04/04/18 2035  . fluconazole (DIFLUCAN) IV 200 mg (04/04/18 1245)   DEY:CXKGYJEHUDJSH **OR** acetaminophen, polyvinyl alcohol, senna-docusate  Pertinent Labs/Diagnostics: -TSH normal -IgG normal - Acetylcholine receptors pending -RPR normal -CK normal  Mr Brain Wo Contrast  Result Date: 04/03/2018 CLINICAL DATA:  Dysphagia EXAM: MRI HEAD WITHOUT CONTRAST TECHNIQUE: Multiplanar, multiecho pulse sequences of the brain and surrounding structures were obtained without intravenous contrast. COMPARISON:  None. FINDINGS: The examination had to be discontinued prior to completion due to patient's sensation of choking. Axial and coronal diffusion-weighted imaging, axial T2 and T2 FLAIR imaging, sagittal T1-weighted imaging and axial susceptibility weighted imaging were acquired. There is no acute infarct, acute hemorrhage,  hydrocephalus or extra-axial collection. The midline structures are normal. No midline shift or other mass effect. There are no old infarcts. The white matter signal is normal for the patient's age.  The cerebral and cerebellar volume are age-appropriate. Susceptibility-sensitive sequences show no chronic microhemorrhage or superficial siderosis. Paranasal sinuses are clear.  Normal orbits. IMPRESSION: 1. Discontinued examination due to patient's cessation of choking. 2. No acute intracranial abnormality. Chronic small vessel ischemia. Electronically Signed   By: Ulyses Jarred M.D.   On: 04/03/2018 21:48    Etta Quill PA-C [facility] [phone]   Assessment: 72 year old male with 46-month history of progressive dysphagia, dysarthria, double vision, inability to handle secretions at times.  Initially on exam patient had no tongue fasciculations noted, a jaw jerk absent.  Today patient has improved significantly with Mestinon.  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 -- Will add Prednisone 20 mg daily with gradual titration of 5mg  every week upto 60mg  daily -- Outpatient referral to Neurology for EMG -- CT chest to look for thymoma   04/05/2018, 9:50 AM   NEUROHOSPITALIST ADDENDUM Performed a face to face diagnostic evaluation.   I have reviewed the contents of history and physical exam as documented by PA/ARNP/Resident and agree with above documentation.  I have discussed and formulated the above plan as documented. Edits to the note have been made as needed.  He has clinically improved after starting Mestinon favors diagnosis of myasthenia gravis.  Currently passed swallow evaluation and neck flexor strength is also improved.  Dysarthria also has improved.  -- Please add Prednisone 20 mg daily with gradual titration of 5mg  every week upto 60mg  daily, start with PPI and calcium supplemant -- Outpatient referral to Neurology for EMG -- CT chest to look for thymoma  -- Avoid drugs that can  worsen Myasthenia gravis     Karena Addison Alajia Schmelzer MD [facility] [phone]   If 7pm to 7am, please call on call as listed on AMION.

## 2018-04-06 ENCOUNTER — Inpatient Hospital Stay (HOSPITAL_COMMUNITY): Payer: Medicare Other

## 2018-04-06 DIAGNOSIS — G7000 Myasthenia gravis without (acute) exacerbation: Secondary | ICD-10-CM | POA: Diagnosis present

## 2018-04-06 DIAGNOSIS — M25572 Pain in left ankle and joints of left foot: Secondary | ICD-10-CM

## 2018-04-06 DIAGNOSIS — E43 Unspecified severe protein-calorie malnutrition: Secondary | ICD-10-CM

## 2018-04-06 HISTORY — DX: Unspecified severe protein-calorie malnutrition: E43

## 2018-04-06 HISTORY — DX: Myasthenia gravis without (acute) exacerbation: G70.00

## 2018-04-06 MED ORDER — OSMOLITE 1.5 CAL PO LIQD
1000.0000 mL | ORAL | Status: DC
  Administered 2018-04-06 – 2018-04-08 (×3): 1000 mL
  Filled 2018-04-06 (×4): qty 1000

## 2018-04-06 MED ORDER — IBUPROFEN 200 MG PO TABS
600.0000 mg | ORAL_TABLET | Freq: Four times a day (QID) | ORAL | Status: DC | PRN
  Administered 2018-04-06: 600 mg via ORAL
  Filled 2018-04-06 (×2): qty 3

## 2018-04-06 MED ORDER — OSMOLITE 1.5 CAL PO LIQD: 1000.0000 mL | ORAL | Status: DC

## 2018-04-06 MED ORDER — SENNOSIDES-DOCUSATE SODIUM 8.6-50 MG PO TABS: 1.0000 | ORAL_TABLET | Freq: Every evening | ORAL | Status: DC | PRN

## 2018-04-06 MED ORDER — LISINOPRIL 20 MG PO TABS
20.0000 mg | ORAL_TABLET | Freq: Every morning | ORAL | Status: DC
  Administered 2018-04-07 – 2018-04-08 (×2): 20 mg
  Filled 2018-04-06 (×3): qty 1

## 2018-04-06 MED ORDER — PYRIDOSTIGMINE BROMIDE 60 MG/5ML PO SOLN
60.0000 mg | Freq: Three times a day (TID) | ORAL | Status: DC
  Administered 2018-04-06 – 2018-04-08 (×7): 60 mg
  Filled 2018-04-06 (×9): qty 5

## 2018-04-06 MED ORDER — FREE WATER
30.0000 mL | Status: DC
  Administered 2018-04-06 – 2018-04-09 (×14): 30 mL

## 2018-04-06 MED ORDER — PANTOPRAZOLE SODIUM 40 MG IV SOLR
40.0000 mg | Freq: Every day | INTRAVENOUS | Status: DC
  Administered 2018-04-06 – 2018-04-08 (×3): 40 mg via INTRAVENOUS
  Filled 2018-04-06 (×3): qty 40

## 2018-04-06 MED ORDER — PYRIDOSTIGMINE BROMIDE 10 MG/2ML IV SOLN
2.0000 mg | Freq: Once | INTRAVENOUS | Status: AC
  Administered 2018-04-06: 2 mg via INTRAVENOUS
  Filled 2018-04-06: qty 0.4

## 2018-04-06 MED ORDER — METHYLPREDNISOLONE SODIUM SUCC 40 MG IJ SOLR
16.0000 mg | Freq: Once | INTRAMUSCULAR | Status: AC
  Administered 2018-04-06: 16 mg via INTRAVENOUS
  Filled 2018-04-06: qty 1

## 2018-04-06 MED ORDER — PREDNISONE 20 MG PO TABS
20.0000 mg | ORAL_TABLET | Freq: Every day | ORAL | Status: DC
  Administered 2018-04-07 – 2018-04-09 (×3): 20 mg
  Filled 2018-04-06 (×3): qty 1

## 2018-04-06 NOTE — Progress Notes (Addendum)
Subjective: Spanish interpreter used Patient feels that he has worsened since yesterday. He still is Spanish-speaking only so must use interpreter.  He reports having more trouble swallowing, increased problems handling his secretions. cortrak in place when I examined him. Patient hates the tube. Would like to know when it is coming out.  NIF: -20 VC: 2.0L  With good effort.  Exam: Vitals:   04/06/18 0358 04/06/18 0753  BP: 115/71 115/73  Pulse: 80 81  Resp: 18 18  Temp: (!) 97.3 F (36.3 C) 97.7 F (36.5 C)  SpO2: 98% 99%    Physical Exam   HEENT-  Normocephalic, no lesions, without obvious abnormality.  Normal external eye and conjunctiva.   Extremities- Warm, dry and intact Musculoskeletal-no joint tenderness, deformity or swelling Skin-warm and dry, no hyperpigmentation, vitiligo, or suspicious lesions    Neuro: - Exam was done via interpretation Mental Status: Alert, oriented, thought content appropriate.  Speech fluent .  Able to follow  commands without difficulty. Cranial Nerves: II:  Visual fields grossly normal,  III,IV, VI: ptosis not present, extra-ocular motions intact bilaterally pupils equal, round, reactive to light and accommodation- able to sustain upward gaze for 20 seconds, but diplopia noted.  V,VII: smile symmetric, facial light touch sensation normal bilaterally VIII: hearing normal bilaterally IX,X: uvula rises midline-  XI: bilateral shoulder shrug-- XII: midline tongue extension Motor: Right : Upper extremity   5/5 Left:     Upper extremity   5/5  Lower extremity   5/5  Lower extremity   5/5 Tone and bulk:normal tone throughout; no atrophy noted .   patient is unable to count to 20 with a single breath.  Sensory:  light touch intact throughout, bilaterally Deep Tendon Reflexes: 2+ and symmetric throughout Plantars: Right: downgoing   Left: downgoing Cerebellar: normal finger-to-nose,  Gait: deferred    Medications:  Scheduled: .  enoxaparin (LOVENOX) injection  40 mg Subcutaneous Q24H  . finasteride  5 mg Oral BH-q7a  . lisinopril  20 mg Oral q morning - 10a  . methylPREDNISolone (SOLU-MEDROL) injection  16 mg Intravenous Once  . pantoprazole  40 mg Oral Daily  . predniSONE  20 mg Oral Q breakfast  . pyridostigmine  60 mg Oral Q8H   Continuous: . dextrose 75 mL/hr at 04/04/18 2035  . fluconazole (DIFLUCAN) IV 200 mg (04/05/18 1431)   WNI:[id number] **OR** acetaminophen, polyvinyl alcohol, senna-docusate  Pertinent Labs/Diagnostics: NIF: -20 VC 2.0 L With good effort   Laurey Morale, MSN, NP-C [facility] [phone]    Assessment: 72 year old male with 16-month history of progressive dysphagia, dysarthria, double vision, inability to handle secretions at times.  Initially on exam patient had no tongue fasciculations noted, a jaw jerk absent.  Today patient has improved significantly with Mestinon.  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 -- Will add Prednisone 20 mg daily with gradual titration of 5mg  every week upto 60mg  daily, start PPI and Calcium supplement -- Outpatient referral to Neurology for EMG -- CT chest to look for thymoma  -Cortrak - consider IVIG if condition worsens -PT/OT -avoid drugs that can worsen Myasthenia Gravis  04/06/2018, 10:09 AM   NEUROHOSPITALIST ADDENDUM Performed a face to face diagnostic evaluation.   I have reviewed the contents of history and physical exam as documented by PA/ARNP/Resident and agree with above documentation.  I  have discussed and formulated the above plan as documented. Edits to the note have been made as needed.   Patient appears clinically worse today compared to yesterday neck flexor strength appears weaker patient's daughter is worse.   Patient was unable to swallow pyridostigmine tablet yesterday which may be possibly the reason.  Recommend placing NG tube in administering medications.  Does not appear to be in crisis at this point, however does require close monitoring of respiratory status agree with continuing periodic NIF testing.     Karena Addison Janece Laidlaw MD [facility] [phone]   If 7pm to 7am, please call on call as listed on AMION.

## 2018-04-06 NOTE — Progress Notes (Signed)
RT note:  NIF: -40 VC: 2.15L  Demonstrated to patient. Patient had great effort. Best of three trys.

## 2018-04-06 NOTE — Progress Notes (Signed)
NIF AND VC performed per orders with good patient effort. Translator used.  NIF: -20 VC: 2.0L

## 2018-04-06 NOTE — Progress Notes (Signed)
Patient unable to tolerate PO medications this AM. Feels he is choking. Will switch the Pyridostigmine to equivalent IV dosing this AM with resumption of PO this afternoon. Switch prednisone to methylprednisolone for today's dose. Hopefully able to tolerate PO prednisone tomorrow. NIF stable around -20, but VC down trending (2.5 to 2.0).   Will discuss case with neurology. Full progress note to follow.

## 2018-04-06 NOTE — Progress Notes (Signed)
Initial Nutrition Assessment  DOCUMENTATION CODES:   Severe malnutrition in context of acute illness/injury  INTERVENTION:   If tube feeds initiated recommend Osmolite 1.5 @ goal rate of 52ml/hr- Recommend initiate at 12ml/hr and increase by 1ml/hr every 8 hours until goal rate is reached.   Free water flushes 41ml q 4 hours  Regimen provides 1800kcal/day, 75g/day protein, 1014ml/day free water   Pt at high refeeding risk; recommend monitor K, Mg and P labs   NUTRITION DIAGNOSIS:   Severe Malnutrition related to dysphagia as evidenced by 11 percent weight loss in 2 months, severe fat depletion, severe muscle depletion.  GOAL:   Patient will meet greater than or equal to 90% of their needs  MONITOR:   Diet advancement, Labs, Weight trends, Skin, I & O's  REASON FOR ASSESSMENT:   Diagnosis    ASSESSMENT:   72 year old man with a history of BPH treated with TURP and HTN who was admitted for progressive dysphagia. Pt being followed by neurology for possible myasthenia gravis    Cortrak tube placed today by this RD with interpreter present in room. Pt with progressively worsening dysphagia since having his teeth removed about 3 months ago. Pt initiatialy started with soft foods but has to progressed a liquid diet over the past 2 weeks. Per chart, pt with 16lb(11%) wt loss over the past 2 months; this is significant. Pt unable to swallow meds or food; cortrak tube placed for medication administration. Spoke to RN, no plans for tube feeds today. Recommend initiate tube feeds if patient unable to advance diet in the next day or so as pt without adequate nutrition since admit. Pt likely at high refeed risk; recommend monitor electrolytes daily.    Medications reviewed and include: lovenox, protonix, prednisone, 5% dextrose @75ml /hr, diflucan  Labs reviewed: K 3.5 wnl, BUN <5(L)  NUTRITION - FOCUSED PHYSICAL EXAM:    Most Recent Value  Orbital Region  Severe depletion  Upper Arm  Region  Severe depletion  Thoracic and Lumbar Region  Severe depletion  Buccal Region  Severe depletion  Temple Region  Severe depletion  Clavicle Bone Region  Severe depletion  Clavicle and Acromion Bone Region  Severe depletion  Scapular Bone Region  Severe depletion  Dorsal Hand  Severe depletion  Patellar Region  Severe depletion  Anterior Thigh Region  Severe depletion  Posterior Calf Region  Severe depletion  Edema (RD Assessment)  None  Hair  Reviewed  Eyes  Reviewed  Mouth  Reviewed  Skin  Reviewed  Nails  Reviewed     Diet Order:   Diet Order            Diet NPO time specified  Diet effective now             EDUCATION NEEDS:   Education needs have been addressed  Skin:  Skin Assessment: Reviewed RN Assessment  Last BM:  pta  Height:   Ht Readings from Last 1 Encounters:  03/31/18 5\' 2"  (1.575 m)    Weight:   Wt Readings from Last 1 Encounters:  03/31/18 58.2 kg    Ideal Body Weight:  53.6 kg  BMI:  There is no height or weight on file to calculate BMI.  Estimated Nutritional Needs:   Kcal:  1600-1800kcal/day   Protein:  75-87g/day   Fluid:  >1.4L/day   Koleen Distance MS, RD, LDN Pager #- [phone] Office#- [phone] After Hours Pager: [phone]

## 2018-04-06 NOTE — Progress Notes (Signed)
  Date: 04/06/2018  Patient name: Danny Walker  Medical record number: [id number]  Date of birth: September 13, 1945   I have seen and evaluated this patient and I have discussed the plan of care with the house staff. Please see their note for complete details. I concur with their findings with the following additions/corrections:   72 year old man admitted with progressive dysphagia, dysarthria, and weight loss.  MRI brain was limited because he began to feel like he was choking from laying flat, but did not show any signs of intracranial mass or other lesion.  Myasthenia gravis is most likely diagnosis, and he has responded extremely well to pyridostigmine.  Yesterday, exam was remarkably improved from admission.  Today, he is having more difficulty swallowing, more difficulty speaking, and also feels like he is struggling a little to catch his breath.  Evidently overnight, he had difficulty swallowing 1 of his doses of pyridostigmine, and this morning he refused to take it.  We have given him an IV dose just prior to rounds.  After discussing with neurology, we elected to proceed with an NG tube to ensure medication delivery and nutrition.  If he is not significantly improved tomorrow, I suspect we may discuss with neurology whether he needs IVIG or plasmapheresis.  He will need close respiratory monitoring as his NIF has been quite low.  He should remain on pulse oximeter and should he have worsening respiratory distress, may require ICU evaluation.  Lenice Pressman, M.D., Ph.D. 04/06/2018, 5:07 PM

## 2018-04-06 NOTE — Procedures (Signed)
Cortrak  Tube Type:  Cortrak - 43 inches Tube Location:  Left nare Initial Placement:  Stomach Secured by: Bridle Technique Used to Measure Tube Placement:  Documented cm marking at nare/ corner of mouth Cortrak Secured At:  53 cm    Cortrak Tube Team Note:  Consult received to place a Cortrak feeding tube.   X-ray is required, abdominal x-ray has been ordered by the Cortrak team. Please confirm tube placement before using the Cortrak tube.   If the tube becomes dislodged please keep the tube and contact the Cortrak team at [email or web address] ([email or web address]) for replacement.  If after hours and replacement cannot be delayed, place a NG tube and confirm placement with an abdominal x-ray.    Koleen Distance MS, RD, LDN Pager #- [phone] Office#- [phone] After Hours Pager: [phone]

## 2018-04-06 NOTE — Progress Notes (Signed)
  Speech Language Pathology Treatment: Dysphagia;Cognitive-Linquistic  Patient Details Name: Danny Walker MRN: [id number] DOB: [birth year] Today's Date: 04/06/2018 Time: 3559-7416 SLP Time Calculation (min) (ACUTE ONLY): 21 min  Assessment / Plan / Recommendation Clinical Impression  Pt seen with neurologist today. Pt reports that he was not able to swallow his medication last night; that typically he has been given pills crushed but last night his pills was whole and he had to spit it out. Dr. Lorraine Lax told pt he would be getting a Cortrak and he and I explained that it is important that he can consistently take meds. Pt noted to have increased weakness of his neck musculature with Dr. Lorraine Lax. Subjectively pt is more dysarthric, though not as severe as upon initial eval. He is not able to complete swallow trigger; three weak swallows per tiny sip, hyolaryngeal excursion does not feel complete. Recommend NPO for now with probable potential upgrade back to puree/thin diet tomorrow after meds take effect. Discussed with pt and daughter.   HPI HPI: Danny Walker 72 year old male with a past medical history of benign prostatic hyperplasia s/p TURP, edentulous and hypertension that presents to the emergency department today for dysphasia.  Patient states that 3 months ago he had his teeth removed and dentures placed. Since that time patient has noticed progressive dysphagia. Initially he had difficulty swallowing solids, feeling that his food would get stuck in his throat and would often regurgitate his food. For the past week he has been unable to swallow liquids. He has associated symptoms of nausea and 20lb weight loss in the past 3 months.  He has tried omeprazole without benefit.  He denies surgery or radiation to his neck.  He was seen in the ED on 11/9 for these symptoms with the plan to follow-up with GI. Unfortunately, he reports unable to swallow liquids over the weekend and after feeling dizzy  this morning he came into the ED. 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   Patient had an esophagram however, the study was limited due to patient's ability to swallow. Results showed laryngeal penetration without aspiration. It showed no focal mucosal lesion, high-grade stricture or esophageal obstruction.  Patient was also noted to have oral thrush on exam. 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 Neck soft tissue on 11/9 WNL.       SLP Plan  Continue with current plan of care       Recommendations  Diet recommendations: NPO                Oral Care Recommendations: Oral care BID Follow up Recommendations: 24 hour supervision/assistance SLP Visit Diagnosis: Dysphagia, oropharyngeal phase (R13.12) Plan: Continue with current plan of care       GO                Latiffany Harwick, Katherene Ponto 04/06/2018, 9:40 AM

## 2018-04-06 NOTE — Progress Notes (Signed)
   Subjective: Mr. Dashner was seen and evaluated at bedside on morning rounds. He feels he has worsened since yesterday. Specifically, he complains of difficulty swallowing to the point that he could not take any of his medications. He also feels like he cannot get enough air in and notes his neck muscles are weak. Also complains of left ankle pain that began 2 days ago and has progressively worsened. No known injury.   Objective:  Vital signs in last 24 hours: Vitals:   04/06/18 0358 04/06/18 0753 04/06/18 1155 04/06/18 1603  BP: 115/71 115/73 133/87 135/80  Pulse: 80 81 88 67  Resp: 18 18 18 18   Temp: (!) 97.3 F (36.3 C) 97.7 F (36.5 C) 98.4 F (36.9 C) 98.1 F (36.7 C)  TempSrc: Oral Oral Oral Oral  SpO2: 98% 99% 99% 98%   General: awake, alert, sitting up in bed in NAD CV: RRR; no murmurs, rubs or gallops Pulm: normal respiratory effort; lungs CTA bilaterally Neuro: speech coherent; good soft palate elevation  Ext: Left ankle with swelling and TTP over lateral malleolus; pain worse with plantar flexion and inversion  Assessment/Plan:  Principal Problem:   Dysphagia Active Problems:   Bulbar weakness (HCC)   Unintentional weight loss   Thrush, oral   Protein-calorie malnutrition, severe  1. Dysphagia: myasthenia gravis most likely etiology - patient with worsening symptoms since yesterday - unable to take medications PO - received 1x dose of pyridostigmine and solumedrol IV - now has cortrak placed and will receive medications through tube  - continue pyridostigmine 60 mg q 8; Prednisone 20 mg daily  - will obtain chest CT to evaluate for thymoma once he improves clinically - continue monitoring vital capacity and NIF q 12  2. Thrush  - continue treatment with IV fluconazole and transition to oral as his swallowing continues to improve   3. Severe protein calorie malnutrition - have started tube feeds per nutrition recommendations; 20 ml/ hr and increase by 15  every 8 hours until goal of 50 ml/hr is reached   4. Left ankle pain - x-ray unremarkable - seems to be soft tissue, ligamentous in etiology. No known injury  - will treat with NSAIDs and ice   5. HTN - blood pressure stable  - continue Lisinopril 20 mg   Dispo: Anticipated discharge in approximately 3-4 day(s).   Modena Nunnery D, DO 04/06/2018, 4:06 PM Pager: [phone]

## 2018-04-07 ENCOUNTER — Inpatient Hospital Stay (HOSPITAL_COMMUNITY): Payer: Medicare Other

## 2018-04-07 LAB — RENAL FUNCTION PANEL
Albumin: 2.9 g/dL — ABNORMAL LOW (ref 3.5–5.0)
Anion gap: 8 (ref 5–15)
BUN: 14 mg/dL (ref 8–23)
CO2: 27 mmol/L (ref 22–32)
Calcium: 9.2 mg/dL (ref 8.9–10.3)
Chloride: 104 mmol/L (ref 98–111)
Creatinine, Ser: 1.31 mg/dL — ABNORMAL HIGH (ref 0.61–1.24)
GFR calc Af Amer: >60 mL/min (ref >60)
GFR calc non Af Amer: 53 mL/min — ABNORMAL LOW (ref >60)
Glucose, Bld: 119 mg/dL — ABNORMAL HIGH (ref 70–99)
Phosphorus: 3.3 mg/dL (ref 2.5–4.6)
Potassium: 3.5 mmol/L (ref 3.5–5.1)
Sodium: 139 mmol/L (ref 135–145)

## 2018-04-07 LAB — MAGNESIUM: Magnesium: 1.7 mg/dL (ref 1.7–2.4)

## 2018-04-07 MED ORDER — IOHEXOL 300 MG/ML  SOLN
75.0000 mL | Freq: Once | INTRAMUSCULAR | Status: AC | PRN
  Administered 2018-04-07: 75 mL via INTRAVENOUS

## 2018-04-07 MED ORDER — DEXTROSE 5 % IV SOLN
INTRAVENOUS | Status: AC
  Administered 2018-04-07: 1000 mL via INTRAVENOUS
  Administered 2018-04-07: 06:00:00 via INTRAVENOUS

## 2018-04-07 NOTE — Progress Notes (Addendum)
Subjective: Spanish interpreter used [phone])  patient awake, in bed, alert, NAD. Cortrak in place. Patient looks better today. Secretions have greatly decreased. Barely suctioning today. Was able to receive mestinon yesterday. Patient states that he feels a lot better today. He feels stronger today than he did yesterday. His spirits are better today and he wants to know when he can go home.  NIF: -40 VC: 2.15L  Best of 3 trys  Exam: Vitals:   04/06/18 2355 04/07/18 0434  BP: 114/69 109/77  Pulse: 68 62  Resp: 18 16  Temp: 97.8 F (36.6 C) 97.6 F (36.4 C)  SpO2: 97% 98%    Physical Exam   HEENT-  Normocephalic, no lesions, without obvious abnormality.  Normal external eye and conjunctiva.   Extremities- Warm, dry and intact Musculoskeletal-reports pain in left food that is better today than it was yesterday.  Skin-warm and dry, no hyperpigmentation, vitiligo, or suspicious lesions    Neuro: - Exam was done via interpretation Mental Status: Alert, oriented, thought content appropriate.  Speech fluent .  Able to follow  commands without difficulty. Cranial Nerves: II:  Visual fields grossly normal,  III,IV, VI: ptosis not present, extra-ocular motions intact bilaterally pupils equal, round, reactive to light and accommodation- able to sustain upward gaze for 20 seconds, without diplopia. Small bit of eye pain noted.   V,VII: smile symmetric, facial light touch sensation normal bilaterally VIII: hearing normal bilaterally IX,X: uvula rises midline-  XI: bilateral shoulder shrug-- XII: midline tongue extension Motor: Right : Upper extremity   5/5 Left:     Upper extremity   5/5  Lower extremity   5/5  Lower extremity   5/5 Tone and bulk:normal tone throughout; no atrophy noted Sensory:  light touch intact throughout, bilaterally Deep Tendon Reflexes: 2+ and symmetric throughout Plantars: Right: downgoing   Left: downgoing Cerebellar: normal finger-to-nose,  Gait:  deferred    Medications:  Scheduled: . enoxaparin (LOVENOX) injection  40 mg Subcutaneous Q24H  . finasteride  5 mg Oral BH-q7a  . free water  30 mL Per Tube Q4H  . lisinopril  20 mg Per Tube q morning - 10a  . pantoprazole (PROTONIX) IV  40 mg Intravenous Daily  . predniSONE  20 mg Per Tube Q breakfast  . pyridostigmine  60 mg Per Tube Q8H   Continuous: . dextrose 75 mL/hr at 04/07/18 0606  . feeding supplement (OSMOLITE 1.5 CAL) 1,000 mL (04/06/18 1506)  . fluconazole (DIFLUCAN) IV 200 mg (04/06/18 1126)   ENI:DPOEUMPNTIRWE **OR** acetaminophen, ibuprofen, polyvinyl alcohol, senna-docusate  Pertinent Labs/Diagnostics: NIF: -40 VC: 2.15L Best of three   Laurey Morale, MSN, NP-C [facility] [phone]    Assessment: 72 year old male with 87-month history of progressive dysphagia, dysarthria, double vision, inability to handle secretions at times.  Initially on exam patient had no tongue fasciculations noted, a jaw jerk absent.  Today patient has improved significantly with Mestinon. His decline yesterday, was most likely due to missing mestinon dose d/t inability to swallow.   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 -- continue Prednisone 20 mg daily with gradual titration of 5mg  every week upto 60mg  daily, start PPI and Calcium supplement -- Outpatient referral to Neurology for EMG -- CT chest to look for thymoma  -continue Cortrak, if dysphagia continue to improve can remove FT. - consider  IVIG if condition worsens -PT/OT -avoid drugs that can worsen Myasthenia Gravis  04/07/2018, 7:25 AM   NEUROHOSPITALIST ADDENDUM Performed a face to face diagnostic evaluation.   I have reviewed the contents of history and physical exam as documented by PA/ARNP/Resident and agree with  above documentation.  I have discussed and formulated the above plan as documented. Edits to the note have been made as needed.   Patient admitted for progressive dysphagia for over few months, improved with Mestinon trial.  NG tube placed yesterday to administer pyridostigmine and steroids.  He is much better today with improved neck flexor strength and improved respiratory effort.  Likely has myasthenia gravis.  Continue pyridostigmine and steroids through NG tube-if he does not continue to improve we may consider plasmapheresis.   Karena Addison Georgiann Neider MD [facility] [phone]   If 7pm to 7am, please call on call as listed on AMION.

## 2018-04-07 NOTE — Progress Notes (Signed)
  Speech Language Pathology Treatment: Dysphagia  Patient Details Name: Danny Walker MRN: [id number] DOB: [birth year] Today's Date: 04/07/2018 Time: 3220-2542 SLP Time Calculation (min) (ACUTE ONLY): 20 min  Assessment / Plan / Recommendation Clinical Impression  Patient today is much improved with speech/swallowing-  SLP used interpreter [phone] for communication.  Pt today reports his speech/voice strength are a level 9 of 10.   He does not demonstrate palatal elevation compromise or dysarthria today.  But continues with mild lingual weakness based on isotonic external resistance/pressure.    Pt states MD advised he would continue with Cortrak today and tomorrow possibly/probably remove.  Observed pt today consuming water and small boluses x2 of applesauce.  Multiple swallows and pt eventual compliant of sensing "phlegm" in throat noted. No indications of airway compromise. SLP questions also if Cortrak may be contributing to his symptoms.  He states prior to Cortrak placement, secretion sensation was worse than currently.  Fortunately pt appears to have adequate sensation to his dysphagia.      Advised pt to consume water today with strict precautions to decrease disuse muscle atrophy and hopefully help manage secretions.  Using teach back, pt agreeable to plan.  Will follow up next date, RN informed.    HPI HPI: Danny Walker 72 year old male with a past medical history of benign prostatic hyperplasia s/p TURP, edentulous and hypertension that presents to the emergency department today for dysphasia.  Patient states that 3 months ago he had his teeth removed and dentures placed. Since that time patient has noticed progressive dysphagia. Initially he had difficulty swallowing solids, feeling that his food would get stuck in his throat and would often regurgitate his food. For the past week he has been unable to swallow liquids. He has associated symptoms of nausea and 20lb weight loss in  the past 3 months.  He has tried omeprazole without benefit.  He denies surgery or radiation to his neck.  He was seen in the ED on 11/9 for these symptoms with the plan to follow-up with GI. Unfortunately, he reports unable to swallow liquids over the weekend and after feeling dizzy this morning he came into the ED. 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   Patient had an esophagram however, the study was limited due to patient's ability to swallow. Results showed laryngeal penetration without aspiration. It showed no focal mucosal lesion, high-grade stricture or esophageal obstruction.  Patient was also noted to have oral thrush on exam. 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 Neck soft tissue on 11/9 WNL.       SLP Plan  Continue with current plan of care       Recommendations  Diet recommendations: Thin liquid(water) Medication Administration: Via alternative means Supervision: Patient able to self feed Compensations: Slow rate;Small sips/bites;Multiple dry swallows after each bite/sip Postural Changes and/or Swallow Maneuvers: Out of bed for meals;Seated upright 90 degrees                Oral Care Recommendations: Oral care BID Follow up Recommendations: (tbd) SLP Visit Diagnosis: Dysphagia, unspecified (R13.10);Dysphagia, pharyngeal phase (R13.13) Plan: Continue with current plan of care       [place], [place] [facility] SLP [place] Pager [phone] Office [phone]   Macario Golds 04/07/2018, 9:21 AM

## 2018-04-07 NOTE — Progress Notes (Signed)
VC  2.1L  NIF -34  Could not get patient to understand to blow out fast  But still good results

## 2018-04-07 NOTE — Progress Notes (Signed)
Medical attending. Clinical status and database reviewed with resident physician Dr. Ina Homes and I concur with his evaluation and management plan. 72 year old man admitted on November 11 with progressive swallowing dysfunction, inability to handle secretions, and weight loss.  He had no bilateral facial muscle weakness.  He admitted to diplopia.  There was no ptosis.  Head flexion was weak against resistance.  He was seen in consultation by neurology.  Diagnosis of myasthenia gravis entertained.  He was started on a trial of Mestinon. He had to have a NG tube placed to help him handle secretions and to provide nutrition.  Dysphagia is improving.  Negative inspiratory force improving.  Antibodies against the acetylcholine receptor obtained and results pending.  Continue Mestinon and supportive care.

## 2018-04-07 NOTE — Progress Notes (Signed)
RT note:  NIF: -27 VC: 1.85L  Demonstrated to patient. Good patient effort. Best out of three.

## 2018-04-07 NOTE — Progress Notes (Addendum)
   Subjective: Patient is feeling much better today compared to days prior. His swallowing has improved and he feels that he can hold his neck up. He would like the NG tube out as soon as possible. Left ankle pain has also significantly improved with ice. We discussed that we need to leave the NG tube in until he has improved a little more but it is reassuring that he is doing better. All questions and concerns addressed.   Objective: Vital signs in last 24 hours: Vitals:   04/06/18 2118 04/06/18 2355 04/07/18 0434 04/07/18 0500  BP:  114/69 109/77   Pulse: 77 68 62   Resp: 18 18 16    Temp:  97.8 F (36.6 C) 97.6 F (36.4 C)   TempSrc:  Oral Oral   SpO2: 97% 97% 98%   Weight: 55.8 kg   56.5 kg   General: Thin male in no acute distress Pulm: Good air movement with no wheezing or crackles  CV: RRR, no murmurs, no rubs  Abdomen: Soft, non-distended, no tenderness to palpation  Neuro: Alert and oriented x 3, good soft palate elevation, good cranial nerve VII function bilaterally.   Assessment/Plan:  Danny Walker is a 72 y.o male who presented to the ED with progressive dysphagia and weight loss of 3-4 months duration. On PE he was found to have significant bulbar weakness. MRI of the head was unrevealing for central pathology and the patient was subsequently started on a trial of pyridostigmine for possible myasthenia gravis.   Dysphagia:myasthenia gravis most likely etiology - Patient's dysphagia and facial weakness improved today compared to prior - NG tube in place  - Continuing pyridostigmine 60 mg every 8 hours and prednisone 20 mg QD  - Will need CT chest to assess for thymoma once able to tolerate lying flat - Follow-up Acetylcholine receptor antibody.  - NIF and VC stable, continue to monitor every 12 hours - Avoiding medication that may exacerbate Myasthenia   AKI  - Patient with a increase in creatinine from 0.8 to 1.3  - Start on D5W 75 cc/hr for 24 hours  Thrush  -  Continue treatment with IV fluconazole (Day 6/10) and transition to oralas his swallowing continues to improve  Severe protein calorie malnutrition - Have started tube feeds per nutrition recommendations; 20 ml/ hr and increase by 15 every 8 hours until goal of 50 ml/hr is reached  - Monitoring for refeeding syndrome. Phos, Mag, and K stable.   Left ankle pain - x-ray unremarkable - seems to be soft tissue, ligamentous in etiology. No known injury  - will treat with NSAIDs and ice   HTN - blood pressure stable  - continue Lisinopril 20 mg   Dispo: Anticipated discharge in approximately 2-3 day(s) pending improvement in oral intake.   Ina Homes, MD 04/07/2018, 8:21 AM

## 2018-04-08 DIAGNOSIS — D649 Anemia, unspecified: Secondary | ICD-10-CM

## 2018-04-08 LAB — RENAL FUNCTION PANEL
Albumin: 2.8 g/dL — ABNORMAL LOW (ref 3.5–5.0)
Anion gap: 8 (ref 5–15)
BUN: 17 mg/dL (ref 8–23)
CO2: 28 mmol/L (ref 22–32)
Calcium: 9.2 mg/dL (ref 8.9–10.3)
Chloride: 105 mmol/L (ref 98–111)
Creatinine, Ser: 1.11 mg/dL (ref 0.61–1.24)
GFR calc Af Amer: >60 mL/min (ref >60)
GFR calc non Af Amer: >60 mL/min (ref >60)
Glucose, Bld: 120 mg/dL — ABNORMAL HIGH (ref 70–99)
Phosphorus: 2.3 mg/dL — ABNORMAL LOW (ref 2.5–4.6)
Potassium: 3.6 mmol/L (ref 3.5–5.1)
Sodium: 141 mmol/L (ref 135–145)

## 2018-04-08 LAB — MAGNESIUM: Magnesium: 1.8 mg/dL (ref 1.7–2.4)

## 2018-04-08 MED ORDER — PYRIDOSTIGMINE BROMIDE 60 MG/5ML PO SOLN
60.0000 mg | Freq: Four times a day (QID) | ORAL | Status: DC
  Administered 2018-04-09 (×2): 60 mg
  Filled 2018-04-08 (×3): qty 5

## 2018-04-08 MED ORDER — SODIUM PHOSPHATES 45 MMOLE/15ML IV SOLN
10.0000 mmol | Freq: Once | INTRAVENOUS | Status: AC
  Administered 2018-04-08: 10 mmol via INTRAVENOUS
  Filled 2018-04-08: qty 3.33

## 2018-04-08 MED ORDER — MAGNESIUM SULFATE 2 GM/50ML IV SOLN
2.0000 g | Freq: Once | INTRAVENOUS | Status: AC
  Administered 2018-04-08: 2 g via INTRAVENOUS
  Filled 2018-04-08: qty 50

## 2018-04-08 MED ORDER — FLUCONAZOLE IN SODIUM CHLORIDE 200-0.9 MG/100ML-% IV SOLN
200.0000 mg | INTRAVENOUS | Status: DC
  Filled 2018-04-08: qty 100

## 2018-04-08 MED ORDER — DEXTROSE 5 % IV SOLN
INTRAVENOUS | Status: AC
  Administered 2018-04-08: 75 mL/h via INTRAVENOUS
  Administered 2018-04-09: 500 mL via INTRAVENOUS

## 2018-04-08 NOTE — Progress Notes (Signed)
   Subjective: Patient is doing well this AM. He is doing better with swallowing. Feels his neck weakness is improved. No longer seeing double. Still having some left ankle pain that improves with ice. Discussed his care with his step-daughter. She would like speech to help teach the patient to use his dentures. She and the patient are anxious to know when he can go home. Discussed that we will coordinate his care with neurology and as long as he continues to progress we can aim for discharge in the next couple of days.   Objective: Vital signs in last 24 hours: Vitals:   04/07/18 2205 04/07/18 2310 04/08/18 0341 04/08/18 0737  BP:  109/60 (!) 105/53 106/61  Pulse:  60 60 (!) 59  Resp:  19 16 18   Temp:  98.7 F (37.1 C) 97.8 F (36.6 C) 97.6 F (36.4 C)  TempSrc:  Oral  Oral  SpO2:  99% 100% 100%  Weight: 56.7 kg      General: Thin male in no acute distress HENT: NG tube Pulm: Good air movement with no wheezing or crackles  CV: RRR, no murmurs, no rubs  Abdomen: Soft, non-distended, no tenderness to palpation  Neuro: Alert and oriented x 3, bulbar function continues to improve   Assessment/Plan:  Danny Walker is a 72 y.o male who presented to the ED with progressive dysphagia and weight loss of 3-4 months duration. On PE he was found to have significant bulbar weakness. MRI of the head was unrevealing for central pathology and the patient was subsequently started on a trial of pyridostigmine for possible myasthenia gravis.   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  - NIF and VC stable, continue to monitor every 12 hours -  Avoiding medication that may exacerbate Myasthenia   Thrush  - Continue treatment with IV fluconazole (Day 7/10) and transition to oralas his swallowing continues to improve  Severe protein calorie malnutrition - Continuing tube feeds per dietician recommendations.  - Monitoring for refeeding syndrome.  - Giving 10 mmol IV sodium phos  - Giving 2g IV mag   Left ankle pain - x-ray unremarkable - seems to be soft tissue, ligamentous in etiology. No known injury  - will treat with NSAIDs and ice  HTN -blood pressure stable  - continue Lisinopril 20 mg  AKI. (Resolved)  Dispo: Anticipated discharge in approximately 2-3 day(s) pending improvement in dysphagia and work with PT/OT.   Ina Homes, MD 04/08/2018, 10:27 AM

## 2018-04-08 NOTE — Progress Notes (Addendum)
Subjective: Spanish interpreter used [phone] Patient awake in bed, NAD. cortrak in place. Still c/o of some foot pain, but states it was x-rayed and the doctor said it was fine. He might want to apply more ice to left foot today because it helped yesterday. Swallowing is still doing well . Patient states he is tired because he stays in bed all day.  NIF: -27 VC: 1.85L Best of three, with good effort  Exam: Vitals:   04/08/18 0341 04/08/18 0737  BP: (!) 105/53 106/61  Pulse: 60 (!) 59  Resp: 16 18  Temp: 97.8 F (36.6 C) 97.6 F (36.4 C)  SpO2: 100% 100%    Physical Exam   HEENT-  Normocephalic, no lesions, without obvious abnormality.  Normal external eye and conjunctiva.   Extremities- Warm, dry and intact Musculoskeletal-reports pain in left food that is better today than it was yesterday.  Skin-warm and dry, no hyperpigmentation, vitiligo, or suspicious lesions    Neuro: - Exam was done via interpretation Mental Status: Alert, oriented, thought content appropriate.  Speech fluent .  Able to follow  commands without difficulty. Cranial Nerves: II:  Visual fields grossly normal,  III,IV, VI: ptosis not present, extra-ocular motions intact bilaterally pupils equal, round, reactive to light and accommodation- able to sustain upward gaze for 20 seconds, without diplopia. Small bit of eye pain noted.   V,VII: smile symmetric, facial light touch sensation normal bilaterally VIII: hearing normal bilaterally IX,X: uvula rises midline-  XI: bilateral shoulder shrug-- XII: midline tongue extension Motor: Right : Upper extremity   5/5 Left:     Upper extremity   5/5  Lower extremity   5/5  Lower extremity   5/5 Tone and bulk:normal tone throughout; no atrophy noted Sensory:  light touch intact throughout, bilaterally Deep Tendon Reflexes: 2+ and symmetric throughout Plantars: Right: downgoing   Left: downgoing Cerebellar: normal finger-to-nose,  Gait: deferred    Medications:   Scheduled: . enoxaparin (LOVENOX) injection  40 mg Subcutaneous Q24H  . finasteride  5 mg Oral BH-q7a  . free water  30 mL Per Tube Q4H  . lisinopril  20 mg Per Tube q morning - 10a  . pantoprazole (PROTONIX) IV  40 mg Intravenous Daily  . predniSONE  20 mg Per Tube Q breakfast  . pyridostigmine  60 mg Per Tube Q8H   Continuous: . dextrose    . feeding supplement (OSMOLITE 1.5 CAL) 1,000 mL (04/07/18 2204)  . fluconazole (DIFLUCAN) IV 200 mg (04/07/18 1403)  . magnesium sulfate 1 - 4 g bolus IVPB    . sodium phosphate  Dextrose 5% IVPB     [facility]:[id number] **OR** acetaminophen, ibuprofen, polyvinyl alcohol, senna-docusate  Pertinent Labs/Diagnostics: NIF: -27 VC: 1.85L Best of three, with good effort  Laurey Morale, MSN, NP-C [facility] [phone]    Assessment: 72 year old male with 84-month history of progressive dysphagia, dysarthria, double vision, inability to handle secretions at times.  Initially on exam patient had no tongue fasciculations noted, a jaw jerk absent.  Today patient has improved significantly with Mestinon. His decline yesterday, was most likely due to missing mestinon dose d/t inability to swallow.   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 -- continue Prednisone 20 mg daily with gradual titration of 5mg  every week upto 60mg  daily, start PPI and Calcium supplement -- Outpatient referral  to Neurology for EMG -- CT chest to look for thymoma  -continue Cortrak, if dysphagia continue to improve can remove FT. - consider IVIG/ plasmapheresis if condition does not improve and unable to swallow over next few days -PT/OT -avoid drugs that can worsen Myasthenia Gravis  04/08/2018, 8:10 AM     If 7pm to 7am, please call on call as listed on  AMION.    NEUROHOSPITALIST ADDENDUM Performed a face to face diagnostic evaluation.   I have reviewed the contents of history and physical exam as documented by PA/ARNP/Resident and agree with above documentation.  I have discussed and formulated the above plan as documented. Edits to the note have been made as needed.  Patient has shown good improvement over last 2 days, speech clear. No longer having facial weakness.  Recommend working with PT.  Hopefully can be discharged in a few days, but if still struggling with speech and fatigue, please call Neurology back for  plasmapheresis.    Karena Addison  MD [facility] [phone]   If 7pm to 7am, please call on call as listed on AMION.

## 2018-04-08 NOTE — Progress Notes (Signed)
Pt had good effort.  NIF -28 and VC 2.0L

## 2018-04-08 NOTE — Progress Notes (Signed)
Medicine attending: I examined this patient today and I concur with the evaluation and management plan as recorded by resident physician Dr. Ina Homes.  72 year old man admitted on November 11 for further evaluation of dysphagia and inability to handle oral secretions.  Neurologic exam remarkable for cranial nerve deficits consistent with a diagnosis of myasthenia gravis.  He is responding to treatment with Mestinon and prednisone.  Minimal residual neurologic deficits today.  CT scan of the chest does not show evidence for a thymoma.  He has a mild normochromic anemia but no suspicion for aplastic anemia. Ongoing follow-up with neurology and speech pathology.  Currently has an NG tube in but as his swallowing improves, we anticipate this will be removed. Negative inspiratory force up to -28. We communicated with the patient's step daughter by phone.

## 2018-04-08 NOTE — Progress Notes (Signed)
  Speech Language Pathology Treatment: Dysphagia  Patient Details Name: Yaw Escoto MRN: [id number] DOB: 01-13-1946 Today's Date: 04/08/2018 Time: 3545-6256 SLP Time Calculation (min) (ACUTE ONLY): 31 min  Assessment / Plan / Recommendation Clinical Impression  Patient seen for diagnostic po trials. Patient alert and cooperative, communicating effectively with use of interpreter. Patient with a functional appearing oropharyngeal swallow with provided trials of thin liquids, consumed via independent small single straw sips, intermittent multiple swallows to clear suspected pharyngeal residuals post swallow. Once solid trials initiated however, patient with increased c/o globus, more frequent dry swallows, and eventual onset of throat clearing to suggest penetration and/or aspiration.  Unable to determine how much patient's Cortrak tube is playing a role in residuals but suspect this is a component. Recommend keeping Cortrak in place, continuing water only following oral care, and proceeding with MBS next date. Given traumatic nature of Cortrak placement, will complete MBS, at least initially with tube in place, removing if necessary pending MD order. Will f/u in am 11/18.     HPI HPI: Mr. Marcellas Marchant 72 year old male with a past medical history of benign prostatic hyperplasia s/p TURP, edentulous and hypertension that presents to the emergency department today for dysphasia.  Patient states that 3 months ago he had his teeth removed and dentures placed. Since that time patient has noticed progressive dysphagia. Initially he had difficulty swallowing solids, feeling that his food would get stuck in his throat and would often regurgitate his food. For the past week he has been unable to swallow liquids. He has associated symptoms of nausea and 20lb weight loss in the past 3 months.  He has tried omeprazole without benefit.  He denies surgery or radiation to his neck.  He was seen in the ED on 11/9  for these symptoms with the plan to follow-up with GI. Unfortunately, he reports unable to swallow liquids over the weekend and after feeling dizzy this morning he came into the ED. 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   Patient had an esophagram however, the study was limited due to patient's ability to swallow. Results showed laryngeal penetration without aspiration. It showed no focal mucosal lesion, high-grade stricture or esophageal obstruction.  Patient was also noted to have oral thrush on exam. 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 Neck soft tissue on 11/9 WNL.       SLP Plan  MBS       Recommendations  Diet recommendations: Thin liquid(water only) Liquids provided via: Cup;Straw Medication Administration: Via alternative means Supervision: Patient able to self feed Compensations: Slow rate;Small sips/bites;Multiple dry swallows after each bite/sip Postural Changes and/or Swallow Maneuvers: Seated upright 90 degrees                Oral Care Recommendations: Oral care prior to ice chip/H20;Oral care QID SLP Visit Diagnosis: Dysphagia, pharyngeal phase (R13.13) Plan: MBS       Darnise Montag MA, CCC-SLP     Vibha Ferdig Meryl 04/08/2018, 9:46 AM

## 2018-04-09 ENCOUNTER — Inpatient Hospital Stay (HOSPITAL_COMMUNITY): Payer: Medicare Other

## 2018-04-09 ENCOUNTER — Other Ambulatory Visit: Payer: Self-pay

## 2018-04-09 LAB — RENAL FUNCTION PANEL
Albumin: 3 g/dL — ABNORMAL LOW (ref 3.5–5.0)
Anion gap: 6 (ref 5–15)
BUN: 14 mg/dL (ref 8–23)
CO2: 30 mmol/L (ref 22–32)
Calcium: 9.2 mg/dL (ref 8.9–10.3)
Chloride: 105 mmol/L (ref 98–111)
Creatinine, Ser: 0.95 mg/dL (ref 0.61–1.24)
GFR calc Af Amer: >60 mL/min (ref >60)
GFR calc non Af Amer: >60 mL/min (ref >60)
Glucose, Bld: 96 mg/dL (ref 70–99)
Phosphorus: 2.9 mg/dL (ref 2.5–4.6)
Potassium: 3.6 mmol/L (ref 3.5–5.1)
Sodium: 141 mmol/L (ref 135–145)

## 2018-04-09 LAB — MAGNESIUM: Magnesium: 2.1 mg/dL (ref 1.7–2.4)

## 2018-04-09 MED ORDER — PANTOPRAZOLE SODIUM 40 MG PO TBEC: 40.0000 mg | DELAYED_RELEASE_TABLET | Freq: Every day | ORAL | Status: DC

## 2018-04-09 MED ORDER — PANTOPRAZOLE SODIUM 40 MG PO PACK
40.0000 mg | PACK | Freq: Every day | ORAL | Status: DC
  Administered 2018-04-09 – 2018-04-12 (×4): 40 mg via ORAL
  Filled 2018-04-09 (×3): qty 20

## 2018-04-09 MED ORDER — FLUCONAZOLE 100 MG PO TABS
200.0000 mg | ORAL_TABLET | Freq: Every day | ORAL | Status: DC
  Administered 2018-04-09 – 2018-04-11 (×3): 200 mg via ORAL
  Filled 2018-04-09 (×3): qty 2

## 2018-04-09 MED ORDER — PYRIDOSTIGMINE BROMIDE 60 MG/5ML PO SOLN
60.0000 mg | Freq: Four times a day (QID) | ORAL | Status: DC
  Administered 2018-04-09 – 2018-04-12 (×13): 60 mg via ORAL
  Filled 2018-04-09 (×14): qty 5

## 2018-04-09 MED ORDER — PREDNISONE 20 MG PO TABS
20.0000 mg | ORAL_TABLET | Freq: Every day | ORAL | Status: DC
  Administered 2018-04-10 – 2018-04-12 (×3): 20 mg via ORAL
  Filled 2018-04-09 (×3): qty 1

## 2018-04-09 MED ORDER — LISINOPRIL 20 MG PO TABS
20.0000 mg | ORAL_TABLET | Freq: Every morning | ORAL | Status: DC
  Administered 2018-04-10 – 2018-04-12 (×3): 20 mg via ORAL
  Filled 2018-04-09 (×3): qty 1

## 2018-04-09 MED ORDER — ENSURE ENLIVE PO LIQD
237.0000 mL | Freq: Three times a day (TID) | ORAL | Status: DC
  Administered 2018-04-09 – 2018-04-12 (×9): 237 mL via ORAL

## 2018-04-09 NOTE — Care Management Note (Signed)
Case Management Note  Patient Details  Name: Danny Walker MRN: [id number] Date of Birth: 03-15-1946  Subjective/Objective:    Pt is spanish speaking and from home. He was admitted with bulbar weakness.                 Action/Plan: Awaiting PT/OT evals. CM following for d/c needs, physician orders.  Expected Discharge Date:                  Expected Discharge Plan:     In-House Referral:     Discharge planning Services     Post Acute Care Choice:    Choice offered to:     DME Arranged:    DME Agency:     HH Arranged:    HH Agency:     Status of Service:  In process, will continue to follow  If discussed at Long Length of Stay Meetings, dates discussed:    Additional Comments:  Pollie Friar, RN 04/09/2018, 12:09 PM

## 2018-04-09 NOTE — Progress Notes (Signed)
NIF and VC completed per physician's orders.  NIF -46 VC 1.6  Pt demonstrated good effort.

## 2018-04-09 NOTE — Progress Notes (Signed)
Internal Medicine Attending:   I saw and examined the patient. I reviewed the resident's note and I agree with the resident's findings and plan as documented in the resident's note.  Patient feels well today with no new complaints.  He states that he has been swallowing liquids well so far.  He states his breathing is good and that he has no diplopia.  Patient initially admitted to the hospital with progressive dysphagia and weight loss likely secondary to myasthenia gravis.  Patient continues to improve with pyridostigmine and prednisone.  We will continue with liquid diet for now and advance per speech recommendations.  We will follow-up acetylcholine receptor antibody levels.  NIF has improved to -36 and vital capacity has decreased to 1.6 L.  Neurology has signed off for now.  Patient will likely be stable for discharge in the next day or 2 if he continues to improve.

## 2018-04-09 NOTE — Progress Notes (Signed)
NIF -36  VC 1.6L Good pt effort

## 2018-04-09 NOTE — Progress Notes (Signed)
  Speech Language Pathology Treatment: Dysphagia  Patient Details Name: Danny Walker MRN: [id number] DOB: 1946/04/29 Today's Date: 04/09/2018 Time: 0254-2706 SLP Time Calculation (min) (ACUTE ONLY): 29 min  Assessment / Plan / Recommendation Clinical Impression  Pt today seen to provide him with written swallow precautions/diet recommendations using interpreter [phone] with teach back/written instructions.  Informed pt that thicker consistencies left more residuals in pharynx that he has a hard time clearing.  Instructed him to work on his "hock" to clear pharynx of residuals at vallecular region.  Pt WILL need follow up SLP to address pharyngeal dysphagia due to his Myasthenia Gravis (? OP vs HH).    He would benefit from repeat MBS prior to dietary advancement due to sensorimotor deficits - defer this to treating SLP outside of the hospital however.  Pt verbalized he can drive himself to his appointments - and SLP advised he speak to MD regarding this issue.  Pt stated understanding to all information reviewed/provided.      HPI HPI: [name] 72 year old male with a past medical history of benign prostatic hyperplasia s/p TURP, edentulous and hypertension that presents to the emergency department today for dysphasia.  Patient states that 3 months ago he had his teeth removed and dentures placed. Since that time patient has noticed progressive dysphagia. Initially he had difficulty swallowing solids, feeling that his food would get stuck in his throat and would often regurgitate his food. For the past week he has been unable to swallow liquids. He has associated symptoms of nausea and 20lb weight loss in the past 3 months.  He has tried omeprazole without benefit.  He denies surgery or radiation to his neck.  He was seen in the ED on 11/9 for these symptoms with the plan to follow-up with GI. Unfortunately, he reports unable to swallow liquids over the weekend and after feeling dizzy this  morning he came into the ED. 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   Patient had an esophagram however, the study was limited due to patient's ability to swallow. Results showed laryngeal penetration without aspiration. It showed no focal mucosal lesion, high-grade stricture or esophageal obstruction.  Patient was also noted to have oral thrush on exam per MD notes IV fluconazole. Pt diagnosed with myasthenia gravis and had cortrak placed.  MBS indicated to assess pharyngeal swallow due to pt's neurological condition.        SLP Plan  Continue with current plan of care       Recommendations  Liquids provided via: Straw Medication Administration: (crush with liquids or suspension form) Supervision: Patient able to self feed Compensations: Slow rate;Small sips/bites;Multiple dry swallows after each bite/sip Postural Changes and/or Swallow Maneuvers: Seated upright 90 degrees                Oral Care Recommendations: Oral care prior to ice chip/H20;Oral care QID Follow up Recommendations: Home health SLP;Inpatient Rehab SLP Visit Diagnosis: Dysphagia, pharyngeal phase (R13.13) Plan: Continue with current plan of care       GO               Luanna Salk, [facility] SLP Acute Rehab Services Pager [phone] Office [phone]  Macario Golds 04/09/2018, 1:02 PM

## 2018-04-09 NOTE — Progress Notes (Signed)
   Subjective: Mr. Lamboy was seen and evaluated at bedside with the assistance of Spanish translator. No acute events overnight. Patient feels much better with NG tube out. Swallowing liquids is going well so far. His neck muscle strength has also improved, and he no longer feels the sensation that he can't get enough air. He complains of eye pain today, but denies any further diplopia. Discussed plan for continued treatment for myasthenia gravis. We will coordinate with neurology and can likely get him home in the next day or so.   Objective:  Vital signs in last 24 hours: Vitals:   04/09/18 0320 04/09/18 0741 04/09/18 1000 04/09/18 1201  BP: 120/70 116/62  126/69  Pulse: (!) 59 61 65 76  Resp: 15 16 16 18   Temp: 97.7 F (36.5 C) 98.2 F (36.8 C)  98.3 F (36.8 C)  TempSrc: Oral Oral  Oral  SpO2: 98% 100% 98% 100%  Weight:       General: awake, alert, sitting up in bed in NAD CV: RRR; no murmurs, rubs or gallops  Pulm: normal respiratory effort; lungs CTA bilaterally  Abd: BS+; abdomen is soft, non-tender non-distended Neuro: A&Ox3; speech coherent; neck muscle strength significantly improved with better head control   Assessment/Plan:  Principal Problem:   Bulbar weakness (HCC) Active Problems:   Dysphagia   Unintentional weight loss   Thrush, oral   Protein-calorie malnutrition, severe   Myasthenia gravis, 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 On PE he was found to have significant bulbar weakness. MRI of the head was unrevealing for central pathology and the patient was subsequently started on a trial of pyridostigmine for possible myasthenia gravis.  Dysphagia:myasthenia gravis most likely etiology - Patient's dysphagia and facial weakness continues to improve  - MBS this morning and was able to have NG tube removed - currently tolerating liquid diet; will advance per  speech recommendations  - Continuing pyridostigmine 60 mg every 8 hours and prednisone 20 mg QD  - CT chest negative for adenopathy or anterior mediastinal mass - Follow-up Acetylcholine receptor antibody.  - NIF and VC stable, continue to monitor every 12 hours - Avoiding medication that may exacerbate Myasthenia - will follow-up with neurology, but patient can likely be discharged in the next day or two pending PT evaluation   Thrush  -Continue treatment with IV fluconazole(Day 8/10)and transition to oralas his swallowing continues to improve  Severe protein calorie malnutrition -currently on full liquid diet - Monitoring for refeeding syndrome; electrolytes stable today   Left ankle pain - x-ray unremarkable - seems to be soft tissue, ligamentous in etiology. No known injury  - improving with ice and NSAIDs   HTN -blood pressure stable  - continue Lisinopril 20 mg  AKI. (Resolved)  Dispo: Anticipated discharge in approximately 1-2 day(s).   Modena Nunnery D, DO 04/09/2018, 12:36 PM Pager: [phone]

## 2018-04-09 NOTE — Progress Notes (Addendum)
NIF  -28 VC  1.4L Good Patient effort.

## 2018-04-09 NOTE — Evaluation (Addendum)
Physical Therapy Evaluation Patient Details Name: Danny Walker MRN: [id number] DOB: 1945/11/03 Today's Date: 04/09/2018   History of Present Illness   72 year old man with a history of BPH treated with TURP and HTN who was admitted for dysphagia, most likely due to myasthenia gravis.    Clinical Impression  PT eval complete. Pt is independent bed mobility and transfers. Supervision provided for ambulation 300 feet without AD. Pt presents with steady gait. Pt encouraged to stay OOB and ambulate in hallway with nursing. RN reports Cortrak removed this AM. Pt may now feel more freedom to get OOB and move. No further PT intervention indicated. PT signing off.    Follow Up Recommendations No PT follow up    Equipment Recommendations  None recommended by PT    Recommendations for Other Services       Precautions / Restrictions Precautions Precautions: None      Mobility  Bed Mobility Overal bed mobility: Independent                Transfers Overall transfer level: Independent Equipment used: None                Ambulation/Gait Ambulation/Gait assistance: Supervision Gait Distance (Feet): 300 Feet Assistive device: None Gait Pattern/deviations: Decreased stride length;Step-through pattern Gait velocity: decreased Gait velocity interpretation: 1.31 - 2.62 ft/sec, indicative of limited community ambulator General Gait Details: Pt with c/o mild dizziness. Steady gait noted. No LOB or physical assist.   Stairs            Wheelchair Mobility    Modified Rankin (Stroke Patients Only)       Balance Overall balance assessment: No apparent balance deficits (not formally assessed)                                           Pertinent Vitals/Pain Pain Assessment: No/denies pain Pain Score: 2  Pain Location: stomach    Home Living Family/patient expects to be discharged to:: Private residence Living Arrangements: Alone Available  Help at Discharge: Family(daughter works, lives 5 minutes away) Type of Home: House Home Access: Ramped entrance     Home Layout: One level Home Equipment: Environmental consultant - 2 wheels;Cane - single point      Prior Function Level of Independence: Independent         Comments: independent and driving      Hand Dominance        Extremity/Trunk Assessment   Upper Extremity Assessment Upper Extremity Assessment: Defer to OT evaluation    Lower Extremity Assessment Lower Extremity Assessment: Overall WFL for tasks assessed    Cervical / Trunk Assessment Cervical / Trunk Assessment: Kyphotic  Communication   Communication: Prefers language other than English;Interpreter utilized(ipad interpreter: Lavonna Monarch [phone])  Cognition Arousal/Alertness: Awake/alert Behavior During Therapy: WFL for tasks assessed/performed Overall Cognitive Status: Within Functional Limits for tasks assessed                                        General Comments      Exercises     Assessment/Plan    PT Assessment Patent does not need any further PT services  PT Problem List         PT Treatment Interventions      PT Goals (Current goals can  be found in the Care Plan section)  Acute Rehab PT Goals Patient Stated Goal: home PT Goal Formulation: All assessment and education complete, DC therapy    Frequency     Barriers to discharge        Co-evaluation PT/OT/SLP Co-Evaluation/Treatment: Yes Reason for Co-Treatment: To address functional/ADL transfers;Other (comment)(utilization of interpreter) PT goals addressed during session: Balance         AM-PAC PT "6 Clicks" Daily Activity  Outcome Measure Difficulty turning over in bed (including adjusting bedclothes, sheets and blankets)?: None Difficulty moving from lying on back to sitting on the side of the bed? : None Difficulty sitting down on and standing up from a chair with arms (e.g., wheelchair, bedside commode,  etc,.)?: None Help needed moving to and from a bed to chair (including a wheelchair)?: None Help needed walking in hospital room?: None Help needed climbing 3-5 steps with a railing? : A Little 6 Click Score: 23    End of Session   Activity Tolerance: Patient tolerated treatment well Patient left: in bed;with call bell/phone within reach Nurse Communication: Mobility status PT Visit Diagnosis: Difficulty in walking, not elsewhere classified (R26.2)    Time: 4944-9675 PT Time Calculation (min) (ACUTE ONLY): 16 min   Charges:   PT Evaluation $PT Eval Low Complexity: 1 Low          Lorrin Goodell, PT  Office # [phone] Pager [phone]   Lorriane Shire 04/09/2018, 12:34 PM

## 2018-04-09 NOTE — Progress Notes (Signed)
Nutrition Follow-up  DOCUMENTATION CODES:   Severe malnutrition in context of acute illness/injury  INTERVENTION:  Provide Ensure Enlive po TID, each supplement provides 350 kcal and 20 grams of protein.  Encourage adequate PO intake.   NUTRITION DIAGNOSIS:   Severe Malnutrition related to dysphagia as evidenced by percent weight loss, severe fat depletion, severe muscle depletion; ongoing  GOAL:   Patient will meet greater than or equal to 90% of their needs; progressing  MONITOR:   PO intake, Supplement acceptance, Diet advancement, Weight trends, Labs, Skin, I & O's  REASON FOR ASSESSMENT:   Diagnosis    ASSESSMENT:   72 year old man with a history of BPH treated with TURP and HTN who was admitted for progressive dysphagia. Pt being followed by neurology for possible myasthenia gravis   Diet has been advanced to a full liquid diet this AM.  Swallowing and neck muscle strength has improved. Tube feeding has been discontinued. Cortrak NGT removed. RD to order nutritional supplements to aid in caloric and protein needs. Pt encouraged to consume his food at meals and to drink his supplements. Labs and medications reviewed.   Diet Order:   Diet Order            Diet full liquid Room service appropriate? Yes; Fluid consistency: Thin  Diet effective now              EDUCATION NEEDS:   Education needs have been addressed  Skin:  Skin Assessment: Reviewed RN Assessment  Last BM:  11/16  Height:   Ht Readings from Last 1 Encounters:  03/31/18 5\' 2"  (1.575 m)    Weight:   Wt Readings from Last 1 Encounters:  04/09/18 56.5 kg    Ideal Body Weight:  53.6 kg  BMI:  Body mass index is 22.78 kg/m.  Estimated Nutritional Needs:   Kcal:  1600-1800kcal/day   Protein:  75-87g/day   Fluid:  >/= 1.5 L/day    Corrin Parker, MS, RD, LDN Pager # [phone] After hours/ weekend pager # [phone]

## 2018-04-09 NOTE — Progress Notes (Signed)
Modified Barium Swallow Progress Note  Patient Details  Name: Danny Walker MRN: [id number] Date of Birth: [birth year]  Today's Date: 04/09/2018  Modified Barium Swallow completed.  Full report located under Chart Review in the Imaging Section.  Brief recommendations include the following:  Clinical Impression  Pt presents with a moderately severe pharyngeal dysphagia c/b decreased hyolaryngeal motility resulting in severe residuals (some mixing with secretions at pyriform sinus) across consistencies tested *thin, nectar, puree, cracker.  He does not consistently sense residuals but dry swallows are effective to decrease them.  Trace laryngeal penetration of thin liquids observed due to decreased adequacy of laryngeal closure but pt did NOT aspirate despite sequential bolus swallows.   Cued throat clearing effective to clear trace penetrates.  Pt did not fully cleary pharynx despite due to conduct dry swallows and "hock".  Advised him to strengthen his "hock" and expectoration ability for airway protection.  Using teach back with interpreter ipad - pt was educated thoroughly.  Two interpreters used number [phone] Oley Balm and [phone] Verdis Frederickson as ipad cut off.  Will follow for swallowing strengthening and readiness for dietary advancement.  Recommend pt consume full liquid diet to decrease muscular effort and maximize nutrition.  Using teach back pt educated and reinforced to findings.     Swallow Evaluation Recommendations       SLP Diet Recommendations: Other (Comment);Thin liquid;Nectar thick liquid(full liquids)   Liquid Administration via: Cup;Straw   Medication Administration: (medicine crushed with nectar liquids - )   Supervision: Patient able to self feed;Full supervision/cueing for compensatory strategies   Compensations: Slow rate;Small sips/bites;Clear throat intermittently;Multiple dry swallows after each bite/sip   Postural Changes: Seated upright at 90 degrees;Remain  semi-upright after after feeds/meals (Comment)   Oral Care Recommendations: Oral care BID      Luanna Salk, MS [place] Pager [phone] Office [phone]   Macario Golds 04/09/2018,9:42 AM

## 2018-04-09 NOTE — Evaluation (Signed)
Occupational Therapy Evaluation Patient Details Name: Danny Walker MRN: [id number] DOB: 09-09-1945 Today's Date: 04/09/2018    History of Present Illness  72 year old man with a history of BPH treated with TURP and HTN who was admitted for dysphagia, most likely due to myasthenia gravis.   Clinical Impression   Pt admitted for above and presents at baseline independent level for ADLs and mobility. He was admitted for above and at this time no further OT needs have been identified.  Thank you for this referral.  OT signing off.     Follow Up Recommendations  No OT follow up    Equipment Recommendations  None recommended by OT    Recommendations for Other Services       Precautions / Restrictions Precautions Precautions: None Restrictions Weight Bearing Restrictions: No      Mobility Bed Mobility Overal bed mobility: Independent                Transfers Overall transfer level: Independent Equipment used: None                  Balance Overall balance assessment: No apparent balance deficits (not formally assessed)                                         ADL either performed or assessed with clinical judgement   ADL Overall ADL's : Independent;At baseline                                       General ADL Comments: patient at baseline for ADLs and functional mobility, no assist needed     Vision Patient Visual Report: No change from baseline Vision Assessment?: No apparent visual deficits     Perception     Praxis      Pertinent Vitals/Pain Pain Assessment: No/denies pain     Hand Dominance     Extremity/Trunk Assessment Upper Extremity Assessment Upper Extremity Assessment: Overall WFL for tasks assessed   Lower Extremity Assessment Lower Extremity Assessment: Defer to PT evaluation   Cervical / Trunk Assessment Cervical / Trunk Assessment: Kyphotic   Communication Communication Communication:  Prefers language other than [place];Interpreter utilized   Cognition Arousal/Alertness: Awake/alert Behavior During Therapy: WFL for tasks assessed/performed Overall Cognitive Status: Within Functional Limits for tasks assessed                                     General Comments       Exercises     Shoulder Instructions      Home Living Family/patient expects to be discharged to:: Private residence Living Arrangements: Alone Available Help at Discharge: Family Type of Home: House Home Access: [place]: One level     Bathroom Shower/Tub: Teacher, early years/pre: [place]: Environmental consultant - 2 wheels;Cane - single point          Prior Functioning/Environment Level of Independence: Independent        Comments: independent and driving         OT Problem List:        OT Treatment/Interventions:      OT Goals(Current goals can be  found in the care plan section) Acute Rehab OT Goals Patient Stated Goal: home OT Goal Formulation: With patient Time For Goal Achievement: 04/23/18 Potential to Achieve Goals: Good  OT Frequency:     Barriers to D/C:            Co-evaluation PT/OT/SLP Co-Evaluation/Treatment: Yes Reason for Co-Treatment: To address functional/ADL transfers;Other (comment)(utilization of interpreter) PT goals addressed during session: Balance OT goals addressed during session: ADL's and self-care;Other (comment)(mobility)      AM-PAC PT "6 Clicks" Daily Activity     Outcome Measure Help from another person eating meals?: None Help from another person taking care of personal grooming?: None Help from another person toileting, which includes using toliet, bedpan, or urinal?: None Help from another person bathing (including washing, rinsing, drying)?: None Help from another person to put on and taking off regular upper body clothing?: None Help from another person to put on and  taking off regular lower body clothing?: None 6 Click Score: 24   End of Session Nurse Communication: Mobility status  Activity Tolerance: Patient tolerated treatment well Patient left: in bed;with call bell/phone within reach  OT Visit Diagnosis: Muscle weakness (generalized) (M62.81)                Time: 2409-7353 OT Time Calculation (min): 19 min Charges:  OT General Charges $OT Visit: 1 Visit OT Evaluation $OT Eval Low Complexity: 1 Low  Delight Stare, OT Acute Rehabilitation Services Pager [phone] Office [phone]   Delight Stare 04/09/2018, 1:30 PM

## 2018-04-10 LAB — RENAL FUNCTION PANEL
Albumin: 3 g/dL — ABNORMAL LOW (ref 3.5–5.0)
Anion gap: 7 (ref 5–15)
BUN: 19 mg/dL (ref 8–23)
CO2: 30 mmol/L (ref 22–32)
Calcium: 9.5 mg/dL (ref 8.9–10.3)
Chloride: 104 mmol/L (ref 98–111)
Creatinine, Ser: 1.14 mg/dL (ref 0.61–1.24)
GFR calc Af Amer: >60 mL/min (ref >60)
GFR calc non Af Amer: >60 mL/min (ref >60)
Glucose, Bld: 93 mg/dL (ref 70–99)
Phosphorus: 3.4 mg/dL (ref 2.5–4.6)
Potassium: 4.1 mmol/L (ref 3.5–5.1)
Sodium: 141 mmol/L (ref 135–145)

## 2018-04-10 LAB — MAGNESIUM: Magnesium: 2 mg/dL (ref 1.7–2.4)

## 2018-04-10 NOTE — Progress Notes (Signed)
Rt Note: vital capacity and nif done at this time  VC 2.73L and NIF >-40   best of three attempts each with great patient effort

## 2018-04-10 NOTE — Progress Notes (Signed)
Internal Medicine Attending:   I saw and examined the patient. I reviewed the resident's note and I agree with the resident's findings and plan as documented in the resident's note.  Patient complains of persistent eye pain as well as reflux symptoms after eating.  He states that he is swallowing well.  Patient was initially admitted to the hospital with progressive dysphagia and weight loss and was found to have bulbar weakness secondary to likely myasthenia gravis.  Continue with pyridostigmine and prednisone for now.  Follow-up acetylcholine receptor antibody.  NIF and vital capacity continue to improve (greater than -40 and 2.7 L respectively).  Patient is tolerating liquid diet well.  Patient will go for another modified barium swallow tomorrow and we will try to advance his diet prior to discharge.  No further work-up at this time.

## 2018-04-10 NOTE — Care Management Note (Signed)
Case Management Note  Patient Details  Name: Danny Walker MRN: [id number] Date of Birth: December 05, 1945  Subjective/Objective:                    Action/Plan: CM met with the patient and medical spanish interpretor and inquired about PCP. Per patient he only has a prostate MD. CM called the Internal Med MD and they are going to see him in their clinic. CM following for further d/c needs.   Expected Discharge Date:                  Expected Discharge Plan:  Home/Self Care  In-House Referral:     Discharge planning Services     Post Acute Care Choice:    Choice offered to:     DME Arranged:    DME Agency:     HH Arranged:    HH Agency:     Status of Service:  In process, will continue to follow  If discussed at Long Length of Stay Meetings, dates discussed:    Additional Comments:  Pollie Friar, RN 04/10/2018, 3:55 PM

## 2018-04-10 NOTE — Progress Notes (Signed)
   Subjective: [name] was seen and evaluated at bedside with the assistance of Spanish interpreter. No acute events overnight. Today he complains of continued eye pain and burning in his chest after eating. He had only tried the artifical tears once since prescribing them yesterday so encouraged him to use them every 2-3 hours for his eye pain. His swallowing continues to improve. Denies further diplopia or neck muscle weakness. Discussed plan to continue current treatment and figure out long-term plan with neurology and speech therapy.   Objective:  Vital signs in last 24 hours: Vitals:   04/09/18 1609 04/09/18 2039 04/10/18 0000 04/10/18 0328  BP: 110/64 125/65 111/70 111/68  Pulse: (!) 58 65 67 61  Resp: 18 18 18 18   Temp: 98.7 F (37.1 C) 98.5 F (36.9 C) 97.7 F (36.5 C) 98.5 F (36.9 C)  TempSrc: Oral Oral Oral Oral  SpO2: 99% 97% 100% 100%  Weight:       General: awake, alert, sitting up in bed in NAD HEENT: PEERL, no conjunctival injection, drainage.  CV: RRR; no murmurs, rubs or gallops Pulm: normal respiratory effort; lungs CTA bilaterally  Neuro: A&Ox3; coherent speech; no diplopia    Assessment/Plan:  Principal Problem:   Myasthenia gravis, bulbar (HCC) Active Problems:   Dysphagia   Bulbar weakness (HCC)   Unintentional weight loss   Thrush, oral   Protein-calorie malnutrition, severe  Danny Walker is a 72 y.o male who presented to the ED with progressive dysphagia and weight loss of 3-4 months duration. On PE he was found to have significant bulbar weakness. MRI of the head was unrevealing for central pathology and the patient was subsequently started on a trial of pyridostigmine for possible myasthenia gravis.  Dysphagia:myasthenia gravis most likely etiology - Patient's dysphagia and facial weakness continues to improve  - currently on liquid diet per speech; will order another MBS for tomorrow to try and advance diet prior to discharge - Continuing  pyridostigmine 60 mg every 8 hours and prednisone 20 mg QD  -CT chest negative for adenopathy or anterior mediastinal mass - Follow-up Acetylcholine receptor antibody.  - NIF and VC continue to improve; >-40 and 2.7L respectively  - Avoiding medication that may exacerbate Myasthenia - will touch base with neurology to confirm plan for follow-up prior to discharge    Thrush  -will complete 10 day course of Fluconazole treatment tomorrow  Severe protein calorie malnutrition -currently on full liquid diet - Monitoring for refeeding syndrome; electrolytes stable today   Left ankle pain - x-ray unremarkable - seems to be soft tissue, ligamentous in etiology. No known injury  - improving with ice and NSAIDs   HTN -blood pressure stable  - continue Lisinopril 20 mg  AKI. (Resolved)  Dispo: Anticipated discharge in approximately 1-2 day(s).   Modena Nunnery D, DO 04/10/2018, 7:03 AM Pager: [phone]

## 2018-04-11 ENCOUNTER — Encounter: Payer: Self-pay | Admitting: Neurology

## 2018-04-11 DIAGNOSIS — G1229 Other motor neuron disease: Secondary | ICD-10-CM

## 2018-04-11 DIAGNOSIS — G7000 Myasthenia gravis without (acute) exacerbation: Principal | ICD-10-CM

## 2018-04-11 LAB — RENAL FUNCTION PANEL
Albumin: 3 g/dL — ABNORMAL LOW (ref 3.5–5.0)
Anion gap: 8 (ref 5–15)
BUN: 21 mg/dL (ref 8–23)
CO2: 29 mmol/L (ref 22–32)
Calcium: 9.5 mg/dL (ref 8.9–10.3)
Chloride: 103 mmol/L (ref 98–111)
Creatinine, Ser: 1 mg/dL (ref 0.61–1.24)
GFR calc Af Amer: >60 mL/min (ref >60)
GFR calc non Af Amer: >60 mL/min (ref >60)
Glucose, Bld: 96 mg/dL (ref 70–99)
Phosphorus: 3.7 mg/dL (ref 2.5–4.6)
Potassium: 3.9 mmol/L (ref 3.5–5.1)
Sodium: 140 mmol/L (ref 135–145)

## 2018-04-11 LAB — MAGNESIUM: Magnesium: 2 mg/dL (ref 1.7–2.4)

## 2018-04-11 NOTE — Progress Notes (Signed)
Speech Language Pathology Treatment: Dysphagia  Patient Details Name: Danny Walker MRN: [id number] DOB: May 11, 1946 Today's Date: 04/11/2018 Time: 8756-4332 SLP Time Calculation (min) (ACUTE ONLY): 15 min  Assessment / Plan / Recommendation Clinical Impression  Pt continues to report adequate tolerance of diet and general improvement. He has been attempting more challenging textures using recommended strategies. Pt demonstrates multiple effortful swallows, following solids with liquids with an intermittent hard throat clear when swallowing cold, very sticky grits and water. No signs of aspiration, no report of globus, no cueing needed. Pt did report an occasional "burning" with PO that he agrees would be consistent with a feeling of heartburn, but does not trigger a sensation requiring cough.   Unfortunately, pt has struggled with his dentures since getting them, because it seems that this dysphagia started at about the same time. Though he initially tried wearing the dentures, he quickly sensed that he could not swallow and in his mind, felt that the dentures were to blame. At this point, he is not accustomed to the dentures, salivates quite a bit with them in place and is generally irritated by them, which is typical. Now that his strength has improved he needs to try and wear them for longer periods of time. I expect that his saliva production with them in place will normalize over time. He was able to demonstrate normal ability to [facility] for someone with new dentures. Recommend he attempt a finely chopped diet today with dentures so he can start to get used to the feeling under supervision. No objective testing warranted to observe this as pt is generally improving and tolerating upgraded textures.Discussed at length with pt.     HPI HPI: Danny Walker 72 year old male with a past medical history of benign prostatic hyperplasia s/p TURP, edentulous and hypertension that presents to the  emergency department today for dysphasia.  Patient states that 3 months ago he had his teeth removed and dentures placed. Since that time patient has noticed progressive dysphagia. Initially he had difficulty swallowing solids, feeling that his food would get stuck in his throat and would often regurgitate his food. For the past week he has been unable to swallow liquids. He has associated symptoms of nausea and 20lb weight loss in the past 3 months.  He has tried omeprazole without benefit.  He denies surgery or radiation to his neck.  He was seen in the ED on 11/9 for these symptoms with the plan to follow-up with GI. Unfortunately, he reports unable to swallow liquids over the weekend and after feeling dizzy this morning he came into the ED. 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   Patient had an esophagram however, the study was limited due to patient's ability to swallow. Results showed laryngeal penetration without aspiration. It showed no focal mucosal lesion, high-grade stricture or esophageal obstruction.  Patient was also noted to have oral thrush on exam per MD notes IV fluconazole. Pt diagnosed with myasthenia gravis and had cortrak placed.  MBS indicated to assess pharyngeal swallow due to pt's neurological condition.        SLP Plan  Continue with current plan of care       Recommendations  Diet recommendations: Dysphagia 2 (fine chop);Thin liquid Medication Administration: Crushed with puree Supervision: Patient able to self feed Compensations: Slow rate;Small sips/bites;Multiple dry swallows after each bite/sip Postural Changes and/or Swallow Maneuvers: Seated upright 90 degrees  Oral Care Recommendations: Patient independent with oral care Follow up Recommendations: 24 hour supervision/assistance;Home health SLP SLP Visit Diagnosis: Dysphagia, pharyngeal phase (R13.13) Plan: Continue with current plan of care       GO                Herbie Baltimore, MA [place] Pager [phone] Office [phone]  Lynann Beaver 04/11/2018, 10:24 AM

## 2018-04-11 NOTE — Progress Notes (Addendum)
Subjective: All subjective history was obtained through a interpreter.  Patient is now eating what was prescribed by speech therapy.  He is eating well.  He does describe some tingling when he swallows water.  He no longer feels as though his face is drooping.  He states he feels much better.  Exam: Vitals:   04/11/18 0411 04/11/18 0745  BP: 104/72 108/65  Pulse: (!) 55 60  Resp:  16  Temp: (!) 97.4 F (36.3 C) 98.2 F (36.8 C)  SpO2: 98% 99%    Physical Exam   HEENT-  Normocephalic, no lesions, without obvious abnormality.  Normal external eye and conjunctiva.   Extremities- Warm, dry and intact Musculoskeletal-no joint tenderness, deformity or swelling Skin-warm and dry, no hyperpigmentation, vitiligo, or suspicious lesions  Neuro:  Mental Status: Alert, oriented, thought content appropriate.  Speech fluent without evidence of aphasia.  Able to follow simple commands Cranial Nerves: II:  Visual fields grossly normal,  III,IV, VI: ptosis not present, extra-ocular motions intact bilaterally pupils equal, round, reactive to light and accommodation V,VII: smile symmetric, facial light touch sensation normal bilaterally VIII: hearing normal bilaterally IX,X: Palatal elevation exhibits a significant improvement from the time we had initial consult XI: bilateral shoulder shrug XII: midline tongue extension Motor: 4/5 throughout Sensory: Pinprick and light touch intact throughout, bilaterally Deep Tendon Reflexes: 2+ and symmetric throughout Plantars: Right: downgoing   Left: downgoing    Medications:  Scheduled: . enoxaparin (LOVENOX) injection  40 mg Subcutaneous Q24H  . feeding supplement (ENSURE ENLIVE)  237 mL Oral TID BM  . finasteride  5 mg Oral BH-q7a  . fluconazole  200 mg Oral Daily  . lisinopril  20 mg Oral q morning - 10a  . pantoprazole sodium  40 mg Oral Daily  . predniSONE  20 mg Oral Q breakfast  . pyridostigmine  60 mg Oral Q6H    Continuous:  WGY:[id number] **OR** acetaminophen, ibuprofen, polyvinyl alcohol, senna-docusate  Pertinent Labs/Diagnostics: Most recent vital capacity was 2.73 L and NIF greater than -40  Etta Quill PA-C [facility] [phone]   Assessment:  72 year old male with a 72-month history of progressive dysphasia, dysarthria, double vision, inability to handle secretions.  After receiving Mestinon and prednisone patient has had significant improvement.  Given his significant provement with Mestinon the most likely diagnosis is myasthenia gravis. Myasthenia gravis panel is pending.   Recommendations: -Continue vital capacity and NIF every 12 hours while in the hospital - Continue Mestinon 60 mg every 6 hours - Continue prednisone 20 mg daily with gradual titration of 5 mg every week up to 60 mg daily. -Will need outpatient referral to neurology for EMG and further evaluation and titration of Mestinon.  Will need follow-up within 4-6 weeks with either [facility] or [facility]. -Continue speech therapy as needed -Avoid drugs that can worsen myasthenia gravis: Drugs to avoid if you have Myasthenia Gravis:  Many drugs have been reported to have adverse effects in patients with MG (see below). However, not all patients react adversely to all these drugs. Conversely, not all "safe" drugs can be used with impunity in patients with MG.As a rule, the listed drugs should be avoided whenever possible, and patients with MG should be followed closely when any new drug is introduced.  Drugs that may exacerbate MG  Antibiotics  Aminoglycosides: e.g., streptomycin, tobramycin, kanamycin  Quinolones: e.g., ciprofloxacin, levofloxacin, ofloxacin, gatifloxacin  Macrolides: e.g., erythromycin, azithromycin, telithromycin  Nondepolarizing muscle relaxants for surgery  d-Tubocurarine (curare), pancuronium, vecuronium, atracurium  Beta-blocking agents  Propranolol, atenolol,  metoprolol  Local anesthetics and related agents  Procaine, Xylocaine in large amounts  Procainamide (for arrhythmias)  Botulinum toxin  Botox exacerbates weakness.  Quinine derivatives  Quinine, quinidine, chloroquine, mefloquine (Lariam)  Magnesium  Decreases ACh release  Penicillamine  May cause MG  Drugs with important interactions in MG  Cyclosporine  Broad range of drug interactions, which may raise or lower cyclosporine levels  Azathioprine  Avoid allopurinol; combination may result in myelosuppression.     Neurology will sign off, thank you very much for this consultation   Electronically signed: Dr. Kerney Elbe 04/11/2018, 8:52 AM

## 2018-04-11 NOTE — Progress Notes (Signed)
Internal Medicine Attending:   I saw and examined the patient. I reviewed the resident's note and I agree with the resident's findings and plan as documented in the resident's note.  Patient feels well today with no new complaints.  He states that his eye pain is resolved with eyedrops.  Patient was initially admitted to the hospital with progressive dysphagia likely secondary to myasthenia gravis.  Neuro follow-up and recommendations appreciated.  Continue with pyridostigmine 60 mg every 6 hours and prednisone 20 mg daily with 5 mg increases until 60 mg is reached.  Follow-up acetylcholine receptor antibody.  SLP follow-up and recommendations appreciated.  Close follow-up with neurology as an outpatient.  If patient tolerating diet well should be able to be discharged home today.  Of note, patient's NIF and vital capacity appear reduced from yesterday.  Would consider repeating this prior to discharge.

## 2018-04-11 NOTE — Progress Notes (Signed)
NIF and VC performed per MD order  NIF -30 VC 1.7  Pt demonstrated good effort.

## 2018-04-11 NOTE — Progress Notes (Signed)
   Subjective: [name] was seen and evaluated at bedside with the assistance of a Spanish interpreter. Patient is feeling well. His eye pain has resolved with the eyedrops. He is currently wearing his dentures and plans to try to eat with them today while being supervised. Swallowing and speech difficulties have resolved. No shortness of breath of chest pain.   Objective:  Vital signs in last 24 hours: Vitals:   04/10/18 1533 04/10/18 2022 04/11/18 0010 04/11/18 0411  BP: 117/78 111/68 118/74 104/72  Pulse: 60 (!) 51 (!) 57 (!) 55  Resp: 15 16    Temp: 98.4 F (36.9 C) 97.7 F (36.5 C) (!) 97.3 F (36.3 C) (!) 97.4 F (36.3 C)  TempSrc: Oral Oral Oral Oral  SpO2: 98% 98% 100% 98%  Weight:      Height:       General: awake, alert, pleasant gentleman sitting up in his chair in NAD CV: RRR; no murmurs, rubs or gallops Pulm: normal respiratory effort; lungs CTA bilaterally Abd: BS+; Soft, mild TTP in epigastric region Neuro: A&Ox3; fluent speech; normal thought content; follows commands appropriately    Assessment/Plan:  Principal Problem:   Myasthenia gravis, bulbar (HCC) Active Problems:   Dysphagia   Bulbar weakness (HCC)   Unintentional weight loss   Thrush, oral   Protein-calorie malnutrition, severe  Danny Walker is a 72 y.o male who presented to the ED with progressive dysphagia and weight loss of 3-4 months duration. On PE he was found to have significant bulbar weakness. MRI of the head was unrevealing for central pathology and the patient was subsequently started on a trial of pyridostigmine for possible myasthenia gravis.  Dysphagia:myasthenia gravis most likely etiology - Facial weakness resolved; progressing well with dyssphagia -SLP eval this morning recommended advancing diet under observation to ensure he can chew and swallow well with dentures in  - Continuing pyridostigmine 60 mg every 6 hours and prednisone 20 mg QD with 5 mg increases weekly until  60 mg is reached  -CT chest negative for adenopathy or anterior mediastinal mass - Follow-up Acetylcholine receptor antibody.  - NIF and VC stable - Avoiding medication that may exacerbate Myasthenia - will follow-up with neurology in 4-6 weeks   Thrush  -s/p 10 day course of Fluconazole   Severe protein calorie malnutrition -advancing diet per speech - if able to tolerate solid diet can be discharged home today - plan is to observe him chewing and swallowing with dentures prior to discharge   Left ankle pain - resolved - x-ray unremarkable - likely soft tissue, ligamentous in etiology. No known injury  -able to ambulate without pain   HTN -blood pressure stable  - continue Lisinopril 20 mg  AKI. (Resolved)  Dispo: Anticipated discharge in approximately 0-1 day(s).   Modena Nunnery D, DO 04/11/2018, 6:33 AM Pager: [phone]

## 2018-04-12 ENCOUNTER — Telehealth: Payer: Self-pay

## 2018-04-12 DIAGNOSIS — Z6822 Body mass index (BMI) 22.0-22.9, adult: Secondary | ICD-10-CM

## 2018-04-12 DIAGNOSIS — Z7952 Long term (current) use of systemic steroids: Secondary | ICD-10-CM

## 2018-04-12 MED ORDER — PYRIDOSTIGMINE BROMIDE 60 MG/5ML PO SOLN: 60.0000 mg | Freq: Four times a day (QID) | ORAL | 12 refills | Status: DC

## 2018-04-12 MED ORDER — PREDNISONE 10 MG PO TABS: ORAL_TABLET | ORAL | 0 refills | Status: DC

## 2018-04-12 MED ORDER — POLYVINYL ALCOHOL 1.4 % OP SOLN: 1.0000 [drp] | OPHTHALMIC | 0 refills | Status: DC | PRN

## 2018-04-12 NOTE — Progress Notes (Signed)
Speech Language Pathology Treatment: Dysphagia  Patient Details Name: Danny Walker MRN: [id number] DOB: 1945/11/10 Today's Date: 04/12/2018 Time: 1856-3149 SLP Time Calculation (min) (ACUTE ONLY): 10 min  Assessment / Plan / Recommendation Clinical Impression  Pt seen with am meal, finely chopped textures such as ground sausage and eggs with thin liquids. He reports inability to use dentures successfully at this point with this food. 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 He does feel that he will need to return to the dentist to refit these dentures since he has lost weight since they were made and do not feel like they are fitting properly.  In regards to his dysphagia, this appears almost fully resolved. He does feel mild globus with solids, but indicates a sensation of stasis more in his esophagus than his pharynx. He had previously reports upon arrival that the "food stopped here" indicating pharynx, but now reports that that sensation is resolved. He demonstrates no signs of aspiration or severe stasis (no coughing, need to hock, no wet vocal quality, etc) and independently follows appropriate compensatory strategies such as following bites with sips, sitting upright, moistening food, swallowing with effort. Pt is able to be independent with intake even though he does not use his dentures. SLP additionally counseled pt to consider increased intake of protein and we discussed what that includes.    HPI HPI: [name] 72 year old male with a past medical history of benign prostatic hyperplasia s/p TURP, edentulous and hypertension that presents to the emergency department today for dysphasia.  Patient states that 3 months ago he had his teeth removed and dentures placed. Since that time patient has noticed progressive dysphagia. Initially he had difficulty  swallowing solids, feeling that his food would get stuck in his throat and would often regurgitate his food. For the past week he has been unable to swallow liquids. He has associated symptoms of nausea and 20lb weight loss in the past 3 months.  He has tried omeprazole without benefit.  He denies surgery or radiation to his neck.  He was seen in the ED on 11/9 for these symptoms with the plan to follow-up with GI. Unfortunately, he reports unable to swallow liquids over the weekend and after feeling dizzy this morning he came into the ED. 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   Patient had an esophagram however, the study was limited due to patient's ability to swallow. Results showed laryngeal penetration without aspiration. It showed no focal mucosal lesion, high-grade stricture or esophageal obstruction.  Patient was also noted to have oral thrush on exam per MD notes IV fluconazole. Pt diagnosed with myasthenia gravis and had cortrak placed.  MBS indicated to assess pharyngeal swallow due to pt's neurological condition.        SLP Plan  Continue with current plan of care       Recommendations  Diet recommendations: Dysphagia 2 (fine chop);Thin liquid Medication Administration: Crushed with puree Supervision: Patient able to self feed Compensations: Slow rate;Small sips/bites;Multiple dry swallows after each bite/sip Postural Changes and/or Swallow Maneuvers: Seated upright 90 degrees                Oral Care Recommendations: Patient independent with oral care Follow up Recommendations: 24 hour supervision/assistance;Home health SLP SLP Visit Diagnosis: Dysphagia, pharyngeal phase (R13.13) Plan: Continue with current  plan of care       GO               Danny Baltimore, MA [place] Pager [phone] Office [phone]  Danny Walker 04/12/2018, 9:27 AM

## 2018-04-12 NOTE — Progress Notes (Signed)
Internal Medicine Attending:   I saw and examined the patient. I reviewed the resident's note and I agree with the resident's findings and plan as documented in the resident's note.  Patient feels well today with no new complaints.  He states that his eye pain has resolved.  He is tolerating an oral diet.  Patient's NIF was greater than -40 and his vital capacity was 2.65 L this morning.  Patient admitted to the hospital with progressive dysphagia secondary to likely myasthenia gravis.  We will follow-up acetylcholine receptor antibody.  Continue with pyridostigmine 60 mg every 6 hours as well as prednisone 25 mg at discharge with weekly increases of 5 mg until 60 mg is reached.  Patient to follow-up with neurology closely as an outpatient.  SLP follow-up and recommendations appreciated.  Patient is tolerating dysphagia 2 diet.  No further work-up at this time.  Patient stable for discharge home today.

## 2018-04-12 NOTE — Care Management Note (Signed)
Case Management Note  Patient Details  Name: [name] MRN: [id number] Date of Birth: April 08, 1946  Subjective/Objective:                    Action/Plan: Pt discharging home with self care. No f/u per PT/OT.  Daughter to provide transport home.   Expected Discharge Date:  04/12/18               Expected Discharge Plan:  Home/Self Care  In-House Referral:     Discharge planning Services     Post Acute Care Choice:    Choice offered to:     DME Arranged:    DME Agency:     HH Arranged:    HH Agency:     Status of Service:  Completed, signed off  If discussed at [employer] of Stay Meetings, dates discussed:    Additional Comments:  Pollie Friar, RN 04/12/2018, 2:45 PM

## 2018-04-12 NOTE — Telephone Encounter (Signed)
Hospital TOC per dr bloomfield, discharge 04/12/2018, appt 04/18/2018.

## 2018-04-12 NOTE — Progress Notes (Signed)
   Subjective: Danny Walker was seen and evaluated at bedside on morning rounds. No acute events overnight. Patient is doing well without complaints. He worked with Astronomer again this morning and is doing well with a blended, finely chopped diet. No eye pain, dyspnea, or speech difficulty. Discussed plan for discharge today with close follow-up in our clinic to establish care, as well as with neurology. All questions and concerns were addressed.    Objective:  Vital signs in last 24 hours: Vitals:   04/11/18 2334 04/12/18 0402 04/12/18 0820 04/12/18 0858  BP: 111/61 (!) 108/58 105/64   Pulse: (!) 57 (!) 57 (!) 55 63  Resp: 16 16  16   Temp: 98.1 F (36.7 C) 98 F (36.7 C) 97.8 F (36.6 C)   TempSrc: Oral Oral Oral   SpO2: 100% 100% 100% 99%  Weight:      Height:       General: awake, alert, pleasant gentleman sitting up in his chair in NAD CV: RRR; no murmurs, rubs or gallops Pulm: normal respiratory effort; lungs CTA bilaterally Abd: soft, non-tender, non-distended Neuro: A&Ox3; clear, coherent speech; no focal deficits; follows commands appropriately   Assessment/Plan:  Principal Problem:   Myasthenia gravis, bulbar (HCC) Active Problems:   Dysphagia   Bulbar weakness (HCC)   Unintentional weight loss   Thrush, oral   Protein-calorie malnutrition, severe  Danny Walker is a 72 y.o male who presented to the ED with progressive dysphagia and weight loss of 3-4 months duration. On PE he was found to have significant bulbar weakness. MRI of the head was unrevealing for central pathology and the patient was subsequently started on a trial of pyridostigmine for possible myasthenia gravis.  Dysphagia:myasthenia gravis most likely etiology - Facial weakness resolved; dysphagia has significantly improved; SLP has advanced to dysphagia 2 diet. He has been instructed on what types of foods to eat and how to prepare them.  - Feel the majority of his difficulty now is with  poorly fitting dentures in the setting of rapid weight loss. Dysphagia has essentially resolved. He will return to dentist next month to have them re-sized  - Continuing pyridostigmine 60 mg every 6 hours; will increase prednisone to 25  mg at discharge with 5 mg increases weekly until 60 mg is reached  -CT chest negative for adenopathy or anterior mediastinal mass - Acetylcholine receptor antibody still pending at discharge; will follow-up   -NIF >-40; VC 2.65L  - Will need to avoid medications in the future that can worsen myasthenia - follow-up with neurology in 4-6 weeks   Thrush  -s/p 10 day course of Fluconazole   Severe protein calorie malnutrition -patient instructed on specific foods to eat for increasing his protein intake - greatly appreciate SLP's involvement in his care  Left ankle pain - resolved - x-ray unremarkable - likely soft tissue, ligamentous in etiology. No known injury  -able to ambulate without pain   HTN -blood pressure stable  - continue Lisinopril 20 mgat discharge   AKI. (Resolved)  Dispo: Anticipated discharge home today.   Modena Nunnery D, DO 04/12/2018, 9:00 AM Pager: [phone]

## 2018-04-12 NOTE — Progress Notes (Signed)
RT NOTE: Patient performed NIF/VC with good patient effort. Patient achieved NIF of greater than -40 and VC of 2.65L. Vitals are stable. RT will continue to monitor.

## 2018-04-12 NOTE — Plan of Care (Signed)
Adequate for discharge.

## 2018-04-12 NOTE — Progress Notes (Signed)
VC 2.42L/NIF -20.Marland Kitchen

## 2018-04-12 NOTE — Discharge Summary (Signed)
Name: Danny Walker MRN: [id number] DOB: 19-Jul-1945 72 y.o. PCP: [name], MD  Date of Admission: 04/02/2018  7:08 AM Date of Discharge: 04/12/2018 Attending Physician: Aldine Contes, MD  Discharge Diagnosis: 1. Myasthenia Gravis   Discharge Medications: Allergies as of 04/12/2018   No Known Allergies     Medication List    STOP taking these medications   oxyCODONE-acetaminophen 5-325 MG tablet Commonly known as:  PERCOCET/ROXICET     TAKE these medications   polyvinyl alcohol 1.4 % ophthalmic solution Commonly known as:  LIQUIFILM TEARS Place 1 drop into both eyes as needed for dry eyes.   predniSONE 10 MG tablet Commonly known as:  DELTASONE Take 2.5 tablets (25 mg total) by mouth daily for 7 days, THEN 3 tablets (30 mg total) daily for 7 days, THEN 3.5 tablets (35 mg total) daily for 7 days, THEN 4 tablets (40 mg total) daily for 7 days, THEN 4.5 tablets (45 mg total) daily for 7 days, THEN 5 tablets (50 mg total) daily for 7 days, THEN 5.5 tablets (55 mg total) daily for 7 days, THEN 6 tablets (60 mg total) daily for 7 days. Start taking on:  04/12/2018   pyridostigmine 60 MG/5ML solution Commonly known as:  MESTINON Take 5 mLs (60 mg total) by mouth every 6 (six) hours.   Vitamin D (Ergocalciferol) 1.25 MG (50000 UT) Caps capsule Commonly known as:  DRISDOL Take 50,000 Units by mouth every Monday.       Disposition and follow-up:   Danny Walker was discharged from [facility] in Good condition.  At the hospital follow up visit please address:  1.  Myasthenia Gravis: Discharged on Pyridostigmine 60 mg q 6 hours and Prednisone 25 mg daily with 5 mg increase q weekly until 60 mg is reached. Ensure he is still tolerating dysphagia 2 diet and that he has follow-up with neurology. Acetylcholine receptor antibody positive. CT chest negative for thymoma.   2.  Labs / imaging needed at time of follow-up: none  3.  Pending  labs/ test needing follow-up: none  Follow-up Appointments: Follow-up Information    [facility] Follow up on 04/25/2018.   Why:  @10 :45 Contact information: [address], [place] Hospital Course by problem list: Danny Walker is a 72 y.o male who presented to the ED with progressive dysphagia and weight loss of 3-4 months duration. On PE he was found to have significant bulbar weakness. MRI of the head was unrevealing for central pathology and the patient was subsequently started on a trial of pyridostigmine for possible myasthenia gravis.  1. Myasthenia Gravis: confirmed by positive acetylcholine receptor antibody serology. Patient was started on pyridostigmine 60 mg q 6 hours with rapid improvement initially. Due to significant dysphagia and inability to get necessary dosing of Pyridostigmine, required NG tube placement for 2 days. He subsequently continued to improve. Neuro also started him on Prednisone 20 mg daily with increase in 5 mg weekly up to 60 mg. Scheduled for outpatient follow-up. Speech worked closely with him to advance diet appropriately. He was advanced to dysphagia 2 diet prior to discharge. Suspect his difficulties with eating are also complicated by poorly fitting dentures which he will hopefully be getting addressed next month.  NIF and VC closely monitored throughout course, never requiring ventilatory support. They improved to -40 and 2.65 respectively by day of discharge.  2. Thrush: Patient received 10 day course of IV fluconazole for oral thrush during hospitalization.   3. Severe protein calorie malnutrition in the setting of progressive dysphagia with poor oral intake: patient was instructed by SLP on specific foods to eat for increasing protein intake.   Discharge Vitals:   BP 107/60 (BP Location: Left Arm)   Pulse 61   Temp 97.9 F (36.6 C) (Oral)   Resp 16   Ht 5\' 2"  (1.575 m)    Wt 56.5 kg   SpO2 99%   BMI 22.78 kg/m   Pertinent Labs, Studies, and Procedures:  TSH: 1.7 Acetylcholine receptor antibodies: positive   CT chest IMPRESSION: No evidence of anterior mediastinal mass to suggest thymoma.  No acute cardiopulmonary disease.  Discharge Instructions: Discharge Instructions    Diet - low sodium heart healthy   Complete by:  As directed    Discharge instructions   Complete by:  As directed    Danny Walker, it was a pleasure taking care of you. I am so glad you are feeling better. You were treated for a condition called Myasthenia Gravis. Please continue taking the Pyridostigmine 60 mg 4 times daily. For the Prednisone, you will take 25 mg daily for 1 week. Then increase by 5 mg each week until you reach 60 mg. Your neurology appointment is scheduled for 12/4.  Unfortunately our clinic is closed next Friday for Thanksgiving. We have you scheduled for next Wednesday 11/27 at 9:45.  I also prescribed you medication for acid reflux if you continue to have burning in your chest (Pantoprazole), and the eye drops for your dry eyes if you need them.  Thank you for allowing [place] to provide your care! Dr. Leandrew Koyanagi, fue un placer cuidarlo. Estoy muy contento de que te sientas mejor. Usted recibi tratamiento para una afeccin llamada miastenia gravis. Contine tomando la piridostigmina 60 mg 4 veces al da. Para la prednisona, tomar 25 mg diarios durante 1 semana. Luego aumente en 5 mg cada semana hasta llegar a 60 mg. Su cita de neurologa est programada para el 12/4. Lamentablemente, nuestra clnica est cerrada el prximo viernes por [facility] [place]. Lo tenemos programado para el prximo mircoles 11/27 a las 9:45. Tambin le recet medicamentos para el reflujo cido si contina con ardor en el pecho (Pantoprazol), y las gotas para los ojos secos si los necesita. Gracias por permitirnos brindarle su atencin! Dr. Koleen Distance   Increase activity  slowly   Complete by:  As directed       Signed: Delice Bison, DO 04/19/2018, 8:46 AM   Pager: [phone]

## 2018-04-13 LAB — ACETYLCHOLINE RECEPTOR AB, ALL
Acety choline binding ab: 7.86 nmol/L — ABNORMAL HIGH (ref 0.00–0.24)
Acetylchol Block Ab: 65 % — ABNORMAL HIGH (ref 0–25)
Acetylcholine Modulat Ab: 57 % — ABNORMAL HIGH (ref 0–20)

## 2018-04-18 ENCOUNTER — Other Ambulatory Visit: Payer: Self-pay

## 2018-04-18 ENCOUNTER — Ambulatory Visit (INDEPENDENT_AMBULATORY_CARE_PROVIDER_SITE_OTHER): Payer: Medicare Other | Admitting: Internal Medicine

## 2018-04-18 VITALS — BP 119/62 | HR 82 | Temp 97.4°F | Ht 63.5 in | Wt 125.7 lb

## 2018-04-18 DIAGNOSIS — Z7952 Long term (current) use of systemic steroids: Secondary | ICD-10-CM | POA: Diagnosis not present

## 2018-04-18 DIAGNOSIS — G7000 Myasthenia gravis without (acute) exacerbation: Secondary | ICD-10-CM

## 2018-04-18 DIAGNOSIS — N400 Enlarged prostate without lower urinary tract symptoms: Secondary | ICD-10-CM

## 2018-04-18 DIAGNOSIS — K219 Gastro-esophageal reflux disease without esophagitis: Secondary | ICD-10-CM

## 2018-04-18 DIAGNOSIS — I10 Essential (primary) hypertension: Secondary | ICD-10-CM | POA: Diagnosis not present

## 2018-04-18 DIAGNOSIS — Z79899 Other long term (current) drug therapy: Secondary | ICD-10-CM | POA: Diagnosis not present

## 2018-04-18 DIAGNOSIS — Z9079 Acquired absence of other genital organ(s): Secondary | ICD-10-CM

## 2018-04-18 DIAGNOSIS — Z960 Presence of urogenital implants: Secondary | ICD-10-CM

## 2018-04-18 DIAGNOSIS — Z978 Presence of other specified devices: Secondary | ICD-10-CM | POA: Insufficient documentation

## 2018-04-18 DIAGNOSIS — N401 Enlarged prostate with lower urinary tract symptoms: Secondary | ICD-10-CM

## 2018-04-18 MED ORDER — LISINOPRIL 20 MG PO TABS: 20.0000 mg | ORAL_TABLET | Freq: Every morning | ORAL | 1 refills | Status: DC

## 2018-04-18 MED ORDER — FINASTERIDE 5 MG PO TABS: 5.0000 mg | ORAL_TABLET | ORAL | 1 refills | Status: DC

## 2018-04-18 MED ORDER — OMEPRAZOLE 40 MG PO CPDR: 40.0000 mg | DELAYED_RELEASE_CAPSULE | Freq: Every morning | ORAL | 1 refills | Status: DC

## 2018-04-18 NOTE — Progress Notes (Signed)
CC: Follow-up of his myasthenia gravis  HPI: [name] is a patient is Spanish speaking 72 y.o. male with a PMH listed below presenting for follow-up of his myasthenia gravis.   Please see A&P for status of the patient's chronic medical conditions  Past Medical History:  Diagnosis Date  . BPH (benign prostatic hyperplasia)   . DDD (degenerative disc disease), lumbosacral   . Fatty liver   . Foley catheter in place   . History of gallstones   . History of prostatitis   . Hypertension   . IDA (iron deficiency anemia)   . Liver mass 09/17/2001   4.7cm , noted on [place] abd  . Low back pain   . Nocturia   . 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 Spondylosis Lumbar  . Umbilical hernia   . Urinary retention 02/08/2018   Review of Systems: Refer to history of present illness and assessment and plans for pertinent review of systems, all others reviewed and negative.  Physical Exam:  Vitals:   04/18/18 0951  BP: 119/62  Pulse: 82  Temp: (!) 97.4 F (36.3 C)  TempSrc: Oral  SpO2: 100%  Weight: 125 lb 11.2 oz (57 kg)  Height: 5' 3.5" (1.613 m)   Physical Exam  Constitutional: He is oriented to person, place, and time and well-developed, well-nourished, and in no distress.  Eyes: EOM are normal. Right eye exhibits no discharge.  Neck: Normal range of motion. Neck supple.  Cardiovascular: Normal rate, regular rhythm and normal heart sounds.  Pulmonary/Chest: Effort normal and breath sounds normal. No respiratory distress.  Abdominal: Soft. Bowel sounds are normal. He exhibits no distension.  Neurological: He is alert and oriented to person, place, and time.  4+/5 strength in upper and lower extremities, 2+ reflexes throughout, EOMI intact  Psychiatric: Mood and affect normal.    Social History   Socioeconomic History  . Marital status: Married    Spouse name: Not on file  . Number of children: Not on file  . Years of education: Not  on file  . Highest education level: Not on file  Occupational History  . Not on file  Social Needs  . Financial resource strain: Not on file  . Food insecurity:    Worry: Not on file    Inability: Not on file  . Transportation needs:    Medical: Not on file    Non-medical: Not on file  Tobacco Use  . Smoking status: Never Smoker  . Smokeless tobacco: Never Used  Substance and Sexual Activity  . Alcohol use: No  . Drug use: Never  . Sexual activity: Yes  Lifestyle  . Physical activity:    Days per week: Not on file    Minutes per session: Not on file  . Stress: Not on file  Relationships  . Social connections:    Talks on phone: Not on file    Gets together: Not on file    Attends religious service: Not on file    Active member of club or organization: Not on file    Attends meetings of clubs or organizations: Not on file    Relationship status: Not on file  . Intimate partner violence:    Fear of current or ex partner: Not on file    Emotionally abused: Not on file    Physically abused: Not on file    Forced sexual activity: Not on file  Other Topics  Concern  . Not on file  Social History Narrative  . Not on file    Family History  Problem Relation Age of Onset  . Hypertension Mother   . Hypertension Father     Assessment & Plan:   See Encounters Tab for problem based charting.  Patient seen with Dr. Evette Doffing

## 2018-04-18 NOTE — Assessment & Plan Note (Addendum)
Patient has a history of GERD that is well controlled with omeprazole.  Plan: -Refill omeprazole 40 mg daily

## 2018-04-18 NOTE — Assessment & Plan Note (Signed)
Patient has a history of hypertension and is currently on lisinopril 20 mg daily.  Blood pressure today was 118/62, at goal.  He denies any issues taking the medications.  Plan: -Continue lisinopril 20 mg daily, sent in refill

## 2018-04-18 NOTE — Assessment & Plan Note (Signed)
Patient was admitted to the hospital from 11/11 to 11/21 where he was found to have Myasthenia gravis.  He initially presented due to difficulty swallowing first to solids and then to liquids, he was also having some issues with speech, dry eyes, blurry vision and double vision.  He was started on pyridostigmine empirically for presumed myasthenia gravis and his symptoms improved with this.  He was discharged home with pyridostigmine and prednisone.  His acetylcholine binding antibody came back positive.  Today he reports that he is doing well, his symptoms improved while in the hospital and continue to improve since discharge.  He has been able to speak and swallow a lot better.  He also reports that his eye symptoms have improved as well, no double vision or blurry vision.  He is currently eating a liquefied diet, they will put food into the plantar in he has been drinking ensures.  He has been using to artificial tears which helps with his eye symptoms.  He does report that he has some generalized weakness but it has been stable.  On exam he had 4+/5 strength, EOMI, 2+ reflexes. He is currently on pyridostigmine 60 mg 4 times a day and prednisone 25 mg daily, he will increase it to 30 mg tomorrow.  It appears that his current regimen is doing well.  Plan: -Continue pyridostigmine 60 mg 4 times a day -Continue prednisone up taper, currently at 25 mg daily and will increase it by 5 mg each week up to 60 mg -He has follow-up with neurology

## 2018-04-18 NOTE — Patient Instructions (Addendum)
Danny Walker, fue un Aeronautical engineer. Gracias por permitirnos brindarle su atencin hoy. Hoy discutimos su miastenia grave y condiciones mdicas crnicas.   Me alegra que te sientas mejor.   Hoy no hicimos cambios en sus medicamentos.   He enviado recargas de sus medicamentos a la Engineer, mining.   Por favor, haga un seguimiento en 3 meses.    Si tiene alguna pregunta o inquietud, llame a la clnica de medicina interna al [phone].    [name], it was nice meeting you. Thank you for allowing [place] to provide your care today. Today we discussed your myasthenia gravis and chronic medical conditions.   I am glad that you are feeling better.   Today we made no changes to your medications.   I have sent in refills of your medications to the pharmacy.   Please follow-up in 3 months.    Should you have any questions or concerns please call the internal medicine clinic at [phone].

## 2018-04-18 NOTE — Assessment & Plan Note (Signed)
Patient has a history of BPH status post TURP, he is followed by the urologist Dr. Alyson Ingles and has follow-up with him next year.  He is currently on finasteride.  Denies any issues with medication.  Plan: -Follow-up with urology -Continue finasteride

## 2018-04-23 NOTE — Progress Notes (Signed)
Internal Medicine Clinic Attending  I saw and evaluated the patient.  I personally confirmed the key portions of the history and exam documented by Dr. Krienke and I reviewed pertinent patient test results.  The assessment, diagnosis, and plan were formulated together and I agree with the documentation in the resident's note.    

## 2018-04-24 ENCOUNTER — Other Ambulatory Visit: Payer: Self-pay | Admitting: Internal Medicine

## 2018-04-25 ENCOUNTER — Ambulatory Visit: Payer: Worker's Compensation | Admitting: Neurology

## 2018-04-25 NOTE — Telephone Encounter (Signed)
HFU Appt completed on 04/18/2018

## 2018-04-27 ENCOUNTER — Telehealth: Payer: Self-pay | Admitting: *Deleted

## 2018-04-27 MED ORDER — PYRIDOSTIGMINE BROMIDE 60 MG/5ML PO SOLN: ORAL | 1 refills | Status: DC

## 2018-04-27 NOTE — Telephone Encounter (Signed)
Pt is taking med 4x daily  Which equals 27ml daily and 667ml per month. So pt is running short every month. Please increase this amt and send new script

## 2018-05-28 ENCOUNTER — Telehealth: Payer: Self-pay | Admitting: *Deleted

## 2018-05-28 NOTE — Telephone Encounter (Signed)
Pt presented at the the clinic with questions about his Prednisone which he only has 1 day left (today) - 5 pills in his pill box. And stated Medicaid will not pay. Translation done with help of his daughter via telephone. Pt has empty pill bottle with him. I called [facility] [employer] ([phone]) on [address] - explained situation to her; she (tech) [facility] [address] (apparently rx had been transferred). Stated she will get rx ready- in about 1 1/2 hours - and cost will be 90 cents. Explained to daughter,[name] who translated to pt.

## 2018-06-05 ENCOUNTER — Other Ambulatory Visit: Payer: Self-pay | Admitting: Internal Medicine

## 2018-06-05 DIAGNOSIS — G7000 Myasthenia gravis without (acute) exacerbation: Secondary | ICD-10-CM

## 2018-06-18 NOTE — Telephone Encounter (Signed)
Patient has follow up with neurology on 06/25/18 for myasthenia gravis.

## 2018-06-23 NOTE — Progress Notes (Signed)
[facility] Clinic Note - Initial Visit   Date: 06/25/18  Danny Walker MRN: [id number] DOB: [birth year]   Dear Dr. Sherry Ruffing:  Thank you for your kind referral of Danny Walker for consultation of seropositive myasthenia gravis. Although his history is well known to you, please allow [place] to reiterate it for the purpose of our medical record. The patient was accompanied to the clinic by Spanish Interpretor who also provides collateral information.     History of Present Illness: Danny Walker is a 73 y.o. Spanish-speaking male with history of hypertension, prostatitis, liver mass, and urinary retention presenting for evaluation of seropositive myasthenia gravis.  Patient was hospitalized at [facility] from 11/11 - 04/12/2018 with 75-month history of progressive difficulty swallowing, facial weakness, vertical double vision, and 30lb weight loss.  Due to high clinical suspicion for myasthenia gravis, he was started on mestinon 60mg  TID with marked improvement.  He necessitated brief placement of NG tube for feeding and medications, which after 2 days was removed.  Testing included MRI brain showing chronic microischemic changes, serology testing which returned positive for AChR antibodies, and CT chest which did not show evidence of thymoma.  He was started on prednisone 20mg  and titrated to 60mg /d.  He has been on prednisone 60mg  daily and mestinon 60mg  four times daily (4am, 10am, 4pm, 10am) since November 2019.  Currently, he denies any problems with ptosis, diplopia, dysphagia, dysarthria, or limb weakness. He is back to eating normal textured foods and regained his weight.  He complains of feeling that his eyes are swollen. No blurred vision, itching, or double vision.   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  His wife passed away in August 28, 2016.  Out-side paper records,  electronic medical record, and images have been reviewed where available and summarized as:  MRI brain wo contrast 04/03/2018: 1. Discontinued examination due to patient's cessation of choking. 2. No acute intracranial abnormality. Chronic small vessel ischemia.  CT chest w contrast 04/07/2018: No evidence of anterior mediastinal mass to suggest thymoma. No acute cardiopulmonary disease.  Labs 04/04/2018:  AChR binding 7.86*, blocking 65*, modulating 57*, TSH 1.77, CK 75, HIV neg, RPR neg  Past Medical History:  Diagnosis Date  . BPH (benign prostatic hyperplasia)   . DDD (degenerative disc disease), lumbosacral   . Fatty liver   . Foley catheter in place   . History of gallstones   . History of prostatitis   . Hypertension   . IDA (iron deficiency anemia)   . Liver mass 09/17/2001   4.7cm , noted on [place] abd  . Low back pain   . Nocturia   . 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 Spondylosis Lumbar  . Umbilical hernia   . Urinary retention 02/08/2018    Past Surgical History:  Procedure Laterality Date  . ERCP W/ SPHICTEROTOMY  09/17/2001  . INSERTION OF MESH N/A 02/05/2016   Procedure: INSERTION OF MESH, UMBILICAL HERNIA;  Surgeon: Armandina Gemma, MD;  Location: [facility];  Service: General;  Laterality: N/A;  . LAPAROSCOPIC CHOLECYSTECTOMY  09/22/2001  . TRANSURETHRAL RESECTION OF PROSTATE N/A 03/05/2018   Procedure: TRANSURETHRAL RESECTION OF THE PROSTATE (TURP);  Surgeon: Cleon Gustin, MD;  Location: [facility];  Service: Urology;  Laterality: N/A;  . UMBILICAL HERNIA REPAIR N/A 02/05/2016  Procedure: UMBILICAL HERNIA REPAIR WITH MESH [facility];  Surgeon: Armandina Gemma, MD;  Location: [facility];  Service: General;  Laterality: N/A;     Medications:  Outpatient Encounter Medications as of 06/25/2018  Medication Sig  . fesoterodine (TOVIAZ) 4 MG TB24 tablet Take 4 mg by mouth daily.  . finasteride  (PROSCAR) 5 MG tablet Take 1 tablet (5 mg total) by mouth every morning.  Marland Kitchen lisinopril (PRINIVIL,ZESTRIL) 20 MG tablet Take 1 tablet (20 mg total) by mouth every morning.  Marland Kitchen omeprazole (PRILOSEC) 40 MG capsule Take 1 capsule (40 mg total) by mouth every morning.  . polyvinyl alcohol (LIQUIFILM TEARS) 1.4 % ophthalmic solution Place 1 drop into both eyes as needed for dry eyes.  Marland Kitchen pyridostigmine (MESTINON) 60 MG/5ML solution TAKE 5 ML BY MOUTH EVERY 6 HOURS  . Vitamin D, Ergocalciferol, (DRISDOL) 50000 units CAPS capsule Take 50,000 Units by mouth every Monday.  . [DISCONTINUED] predniSONE (DELTASONE) 20 MG tablet Take 3 tablets (60 mg total) by mouth daily with breakfast for 30 days.  . calcium-vitamin D (OSCAL WITH D) 500-200 MG-UNIT tablet Take 1 tablet by mouth 2 (two) times daily.  . famotidine (PEPCID AC MAXIMUM STRENGTH) 20 MG tablet Take 1 tablet (20 mg total) by mouth 2 (two) times daily.  . predniSONE (DELTASONE) 10 MG tablet Take 5 tablets (50mg ) x 2 weeks, then 4 tablets (40mg ) x 2 week, then stay on 3 tablets (30mg )   No facility-administered encounter medications on file as of 06/25/2018.      Allergies: No Known Allergies  Family History: Family History  Problem Relation Age of Onset  . Hypertension Mother   . Hypertension Father     Social History: Social History   Tobacco Use  . Smoking status: Never Smoker  . Smokeless tobacco: Never Used  Substance Use Topics  . Alcohol use: No  . Drug use: Never   Social History   Social History Narrative   Lives with daughter in a one story home.  Education: high school.  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      Review of Systems:  CONSTITUTIONAL: No fevers, chills, night sweats, or weight loss.   EYES: No visual changes or eye pain ENT: No hearing changes.  No history of nose bleeds.   RESPIRATORY: No cough, wheezing and shortness of breath.   CARDIOVASCULAR: Negative for chest pain, and palpitations.     GI: Negative for abdominal discomfort, blood in stools or black stools.  No recent change in bowel habits.   GU:  No history of incontinence.   MUSCLOSKELETAL: No history of joint pain or swelling.  No myalgias.   SKIN: Negative for lesions, rash, and itching.   HEMATOLOGY/ONCOLOGY: Negative for prolonged bleeding, bruising easily, and swollen nodes.  No history of cancer.   ENDOCRINE: Negative for cold or heat intolerance, polydipsia or goiter.   PSYCH:  No depression or anxiety symptoms.   NEURO: As Above.   Vital Signs:  BP (!) 160/80   Pulse 98   Ht 5' 3.5" (1.613 m)   Wt 158 lb 2 oz (71.7 kg)   SpO2 98%   BMI 27.57 kg/m    General Medical Exam:   General:  Well appearing, comfortable.   Eyes/ENT: see cranial nerve examination.   Neck: No masses appreciated.  Full range of motion without tenderness.  No carotid bruits. Respiratory:  Clear to auscultation, good air entry bilaterally.   Cardiac:  Regular rate  and rhythm, no murmur.   Extremities:  No deformities, edema, or skin discoloration.  Skin:  No rashes or lesions.  Neurological Exam: MENTAL STATUS including orientation to time, place, person, recent and remote memory, attention span and concentration, language, and fund of knowledge is normal.  Speech is not dysarthric.  CRANIAL NERVES: II:  No visual field defects.  Unremarkable fundi.   III-IV-VI: Pupils equal round and reactive to light.  Normal conjugate, extra-ocular eye movements in all directions of gaze.  No nystagmus.  No ptosis at rest and trace with sustained upgaze on the right only.   V:  Normal facial sensation.    VII:  Normal facial symmetry and movements.   VIII:  Normal hearing and vestibular function.   IX-X:  Normal palatal movement.   XI:  Normal shoulder shrug and head rotation.   XII:  Normal tongue strength and range of motion, no deviation or fasciculation.  MOTOR:  No atrophy, fasciculations or abnormal movements.  No pronator drift.   Tone is normal.   Neck flexion/extension is 5/5.  No fatigable weakness.   Right Upper Extremity:    Left Upper Extremity:    Deltoid  5/5   Deltoid  5/5   Biceps  5/5   Biceps  5/5   Triceps  5/5   Triceps  5/5   Wrist extensors  5/5   Wrist extensors  5/5   Wrist flexors  5/5   Wrist flexors  5/5   Finger extensors  5/5   Finger extensors  5/5   Finger flexors  5/5   Finger flexors  5/5   Dorsal interossei  5/5   Dorsal interossei  5/5   Abductor pollicis  5/5   Abductor pollicis  5/5   Tone (Ashworth scale)  0  Tone (Ashworth scale)  0   Right Lower Extremity:    Left Lower Extremity:    Hip flexors  5/5   Hip flexors  5/5   Hip extensors  5/5   Hip extensors  5/5   Knee flexors  5/5   Knee flexors  5/5   Knee extensors  5/5   Knee extensors  5/5   Dorsiflexors  5/5   Dorsiflexors  5/5   Plantarflexors  5/5   Plantarflexors  5/5   Toe extensors  5/5   Toe extensors  5/5   Toe flexors  5/5   Toe flexors  5/5   Tone (Ashworth scale)  0  Tone (Ashworth scale)  0   MSRs:  Right                                                                 Left brachioradialis 2+  brachioradialis 2+  biceps 2+  biceps 2+  triceps 2+  triceps 2+  patellar 2+  patellar 2+  ankle jerk 1+  ankle jerk 1+  Hoffman no  Hoffman no  plantar response down  plantar response down   SENSORY:  Normal and symmetric perception of light touch, pinprick, vibration, and proprioception.   COORDINATION/GAIT: Normal finger-to- nose-finger.  Intact rapid alternating movements bilaterally.  Able to rise from a chair without using arms.  Gait narrow based and stable, unassisted.   IMPRESSION: 1.  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    - He has been on high dose prednisone for > 2 months and disease appears well-controlled.  Will start tapering prednisone  - Reduce prednisone to 50mg  x 2 weeks, and taper by 10mg   every 2 weeks.  Schedule was provided.  - Reduce mestinon 60mg  to three times daily (10am, 4pm, and 10p)  - Patient education provided on the diagnosis of myasthenia, pathogenesis, prognosis, management plan, meds to avoid, and potential crisis myasthenia gravis. I also discussed the warning signs and symptoms of MG crisis (including significant swallowing and breathing difficulty) that should prompt urgent medical evaluation since an exacerbation of myasthenia gravis is potentially life-threatening.  2.  Longterm steroid side effects  - Start pepcid 20mg  BID  - Start calcium 1200 mg/day and vitamin D intake of 800 international units/day  - Check HbA1c due to risk of hyperglycemia   - Potential side effects include: weight gain, glaucoma, cataracts, heart disease, high cholesterol, pancreatitis, high blood pressure, cleft palate in baby of pregnant mothers, osteoporosis, vertebral fractures, osteonecrosis, muscle weakness, high blood sugar, increased risk of infection, mood disorder, restlessness, memory impairment & psychosis.   Return to clinic in 6 weeks    Thank you for allowing me to participate in patient's care.  If I can answer any additional questions, I would be pleased to do so.    Sincerely,    Donika K. Posey Pronto, DO

## 2018-06-25 ENCOUNTER — Encounter: Payer: Self-pay | Admitting: Neurology

## 2018-06-25 ENCOUNTER — Ambulatory Visit (INDEPENDENT_AMBULATORY_CARE_PROVIDER_SITE_OTHER): Payer: Medicare Other | Admitting: Neurology

## 2018-06-25 ENCOUNTER — Other Ambulatory Visit (INDEPENDENT_AMBULATORY_CARE_PROVIDER_SITE_OTHER): Payer: Medicare Other

## 2018-06-25 VITALS — BP 160/80 | HR 98 | Ht 63.5 in | Wt 158.1 lb

## 2018-06-25 DIAGNOSIS — Z131 Encounter for screening for diabetes mellitus: Secondary | ICD-10-CM

## 2018-06-25 DIAGNOSIS — R739 Hyperglycemia, unspecified: Secondary | ICD-10-CM | POA: Diagnosis not present

## 2018-06-25 DIAGNOSIS — E162 Hypoglycemia, unspecified: Secondary | ICD-10-CM | POA: Diagnosis not present

## 2018-06-25 DIAGNOSIS — G7000 Myasthenia gravis without (acute) exacerbation: Secondary | ICD-10-CM | POA: Diagnosis not present

## 2018-06-25 LAB — HEMOGLOBIN A1C: Hgb A1c MFr Bld: 6.5 % (ref 4.6–6.5)

## 2018-06-25 MED ORDER — FAMOTIDINE 20 MG PO TABS: 20.0000 mg | ORAL_TABLET | Freq: Two times a day (BID) | ORAL | 5 refills | Status: DC

## 2018-06-25 MED ORDER — CALCIUM CARBONATE-VITAMIN D 500-200 MG-UNIT PO TABS: 1.0000 | ORAL_TABLET | Freq: Two times a day (BID) | ORAL | 5 refills | Status: DC

## 2018-06-25 MED ORDER — PREDNISONE 10 MG PO TABS: ORAL_TABLET | ORAL | 3 refills | Status: DC

## 2018-06-25 NOTE — Patient Instructions (Addendum)
1. Semana 1 + 2: Tome prednisona 50mg  (5 tabletas) diariamente      Semana 3 + 4: tome prednisona 40 mg (4 tabletas) diariamente      Semana 5 + 6: tome prednisona 30 mg (3 tabletas) diariamente y contine   2. Reduzca la piridostigmina a 60 mg tres veces al da (10 a.m., 4 p.m. y 10 p.m.)   3. Revise los laboratorios para detectar diabetes  4. Por favor comience calcio y pepcid   5. Si su molestia ocular persiste, consulte a su oftalmlogo   Regreso a la Engineer, manufacturing 10 de marzo a las 3 p.m.

## 2018-06-26 ENCOUNTER — Telehealth: Payer: Self-pay | Admitting: *Deleted

## 2018-06-26 NOTE — Telephone Encounter (Signed)
Called patient's daughter and gave her the results.

## 2018-06-26 NOTE — Telephone Encounter (Signed)
-----   Message from Alda Berthold, DO sent at 06/25/2018  6:12 PM EST ----- Please inform patients daughter labs show borderline diabetes, hopefully going down on prednisone will help reduce risk of diabetes. Thanks.

## 2018-07-30 NOTE — Progress Notes (Signed)
Follow-up Visit   Date: 08-22-18   Danny Walker MRN: [id number] DOB: April 01, 1946   Interim History: Danny Walker is a 73 y.o. Spanish-speaking male with history of hypertension, prostatitis, liver mass, and urinary retention returning to the clinic for follow-up of seropositive myasthenia gravis.  The patient was accompanied to the clinic by Spanish interpretor who also provides collateral information.    History of present illness: Patient was hospitalized at [facility] from 11/11 - 04/12/2018 with 60-month history of progressive difficulty swallowing, facial weakness, vertical double vision, and 30lb weight loss.  Due to high clinical suspicion for myasthenia gravis, he was started on mestinon 60mg  TID with marked improvement.  He necessitated brief placement of NG tube for feeding and medications, which after 2 days was removed.  Testing included MRI brain showing chronic microischemic changes, serology testing which returned positive for AChR antibodies, and CT chest which did not show evidence of thymoma.  He was started on prednisone 20mg  and titrated to 60mg /d.  He has been on prednisone 60mg  daily and mestinon 60mg  four times daily (4am, 10am, 4pm, 10am) since November 2019.  Currently, he denies any problems with ptosis, diplopia, dysphagia, dysarthria, or limb weakness. He is back to eating normal textured foods and regained his weight.  He complains of feeling that his eyes are swollen. No blurred vision, itching, or double vision.   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  His wife passed away in 09/06/16.  UPDATE 2018/08/22:  He is here for follow-up visit and has been doing great tapering his prednisone down to 30mg  daily.  No breakthrough weakness, droopy eyelids, double vision, difficulty swallowing/talking. His weight is trending up from 158lb at his last visit to 166lb.  He is requesting  refills for mestinon, prednisone, and omeprazole. He is complaining of right shoulder pain which is worse with overuse and sometimes feels unstable, as if it could dislocate out of the joint.    Medications:  Current Outpatient Medications on File Prior to Visit  Medication Sig Dispense Refill  . calcium-vitamin D (OSCAL WITH D) 500-200 MG-UNIT tablet Take 1 tablet by mouth 2 (two) times daily. 60 tablet 5  . fesoterodine (TOVIAZ) 4 MG TB24 tablet Take 4 mg by mouth daily.    . finasteride (PROSCAR) 5 MG tablet Take 1 tablet (5 mg total) by mouth every morning. 90 tablet 1  . lisinopril (PRINIVIL,ZESTRIL) 20 MG tablet Take 1 tablet (20 mg total) by mouth every morning. 90 tablet 1  . polyvinyl alcohol (LIQUIFILM TEARS) 1.4 % ophthalmic solution Place 1 drop into both eyes as needed for dry eyes. 15 mL 0  . Vitamin D, Ergocalciferol, (DRISDOL) 50000 units CAPS capsule Take 50,000 Units by mouth every Monday.  2   No current facility-administered medications on file prior to visit.     Allergies: No Known Allergies  Review of Systems:  CONSTITUTIONAL: No fevers, chills, night sweats, or weight loss.  EYES: No visual changes or eye pain ENT: No hearing changes.  No history of nose bleeds.   RESPIRATORY: No cough, wheezing and shortness of breath.   CARDIOVASCULAR: Negative for chest pain, and palpitations.   GI: Negative for abdominal discomfort, blood in stools or black stools.  No recent change in bowel habits.   GU:  No history of incontinence.   MUSCLOSKELETAL: +history of joint pain or swelling.  No  myalgias.   SKIN: Negative for lesions, rash, and itching.   ENDOCRINE: Negative for cold or heat intolerance, polydipsia or goiter.   PSYCH:  No depression or anxiety symptoms.   NEURO: As Above.   Vital Signs:  BP 138/80   Pulse 96   Ht 5' 3.5" (1.613 m)   Wt 166 lb (75.3 kg)   SpO2 97%   BMI 28.94 kg/m    General Medical Exam:   General:  Well appearing, comfortable    Eyes/ENT: see cranial nerve examination.   Neck:  No carotid bruits. Respiratory:  Clear to auscultation, good air entry bilaterally.   Cardiac:  Regular rate and rhythm, no murmur.   Ext:  No edema   Neurological Exam: MENTAL STATUS including orientation to time, place, person, recent and remote memory, attention span and concentration, language, and fund of knowledge is normal.  Speech is not dysarthric.  CRANIAL NERVES:  No visual field defects.  Pupils equal round and reactive to light.  Normal conjugate, extra-ocular eye movements in all directions of gaze.  No ptosis at rest or with sustained upgaze.  Face is symmetric, facial muscles are 5/5 including orbicularis oculi, oris, and buccinator. Palate elevates symmetrically.  Tongue is midline and strength is 5/5.  MOTOR:  Motor strength is 5/5 in all extremities, no fatigability.  Neck flexion is 5/5.Marland Kitchen  No atrophy, fasciculations or abnormal movements.  No pronator drift.  Tone is normal.    COORDINATION/GAIT:  Easily able to stand up without using arms to push off.   Gait narrow based and stable.   Data: MRI brain wo contrast 04/03/2018: 1. Discontinued examination due to patient's cessation of choking. 2. No acute intracranial abnormality. Chronic small vessel ischemia.  CT chest w contrast 04/07/2018: No evidence of anterior mediastinal mass to suggest thymoma. No acute cardiopulmonary disease.  Labs 04/04/2018:  AChR binding 7.86*, blocking 65*, modulating 57*, TSH 1.77, CK 75, HIV neg, RPR neg   IMPRESSION/PLAN: 1.  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  Longterm steroid side effects              - Continue omeprazole 40mg  daily              - Continue calcium  1200 mg/day and vitamin D intake of 800 international units/day  3.  Right shoulder pain is most likely joint-related, follow-up with PCP.    Return to clinic in 2 months.    Thank you for allowing me to participate in patient's care.  If I can answer any additional questions, I would be pleased to do so.    Sincerely,    Riki Berninger K. Posey Pronto, DO

## 2018-07-31 ENCOUNTER — Ambulatory Visit (INDEPENDENT_AMBULATORY_CARE_PROVIDER_SITE_OTHER): Payer: Medicare Other | Admitting: Neurology

## 2018-07-31 ENCOUNTER — Encounter: Payer: Self-pay | Admitting: Neurology

## 2018-07-31 VITALS — BP 138/80 | HR 96 | Ht 63.5 in | Wt 166.0 lb

## 2018-07-31 DIAGNOSIS — K219 Gastro-esophageal reflux disease without esophagitis: Secondary | ICD-10-CM | POA: Diagnosis not present

## 2018-07-31 DIAGNOSIS — G7000 Myasthenia gravis without (acute) exacerbation: Secondary | ICD-10-CM | POA: Diagnosis not present

## 2018-07-31 MED ORDER — PREDNISONE 10 MG PO TABS: ORAL_TABLET | ORAL | 5 refills | Status: DC

## 2018-07-31 MED ORDER — OMEPRAZOLE 40 MG PO CPDR: 40.0000 mg | DELAYED_RELEASE_CAPSULE | Freq: Every morning | ORAL | 3 refills | Status: DC

## 2018-07-31 MED ORDER — PYRIDOSTIGMINE BROMIDE 60 MG PO TABS: ORAL_TABLET | ORAL | 3 refills | Status: DC

## 2018-07-31 NOTE — Patient Instructions (Addendum)
Reduce prednisone to 20mg  daily (2 tablets) in one week  Continue pyridostigmine 60mg  three times daily  Return to clinic in 2 months

## 2018-10-09 ENCOUNTER — Ambulatory Visit: Payer: Medicare Other | Admitting: Neurology

## 2018-10-17 ENCOUNTER — Telehealth: Payer: Medicare Other | Admitting: Neurology

## 2018-10-19 ENCOUNTER — Encounter: Payer: Self-pay | Admitting: *Deleted

## 2018-10-22 ENCOUNTER — Other Ambulatory Visit: Payer: Self-pay

## 2018-10-22 ENCOUNTER — Telehealth (INDEPENDENT_AMBULATORY_CARE_PROVIDER_SITE_OTHER): Payer: Medicare Other | Admitting: Neurology

## 2018-10-22 DIAGNOSIS — G7000 Myasthenia gravis without (acute) exacerbation: Secondary | ICD-10-CM

## 2018-10-22 MED ORDER — PREDNISONE 10 MG PO TABS: ORAL_TABLET | ORAL | 5 refills | Status: DC

## 2018-10-22 MED ORDER — PYRIDOSTIGMINE BROMIDE 60 MG PO TABS: ORAL_TABLET | ORAL | 3 refills | Status: DC

## 2018-10-22 NOTE — Progress Notes (Signed)
    Virtual Visit via Telephone Note The purpose of this virtual visit is to provide medical care while limiting exposure to the novel coronavirus.    Consent was obtained for phone visit:  Yes.   Answered questions that patient had about telehealth interaction:  Yes.   I discussed the limitations, risks, security and privacy concerns of performing an evaluation and management service by telephone. I also discussed with the patient that there may be a patient responsible charge related to this service. The patient expressed understanding and agreed to proceed.  Pt location: Home Physician Location: office Name of referring provider:  [name], MD I connected with .Danny Walker at patients initiation/request on 10/22/2018 at 10:30 AM EDT by telephone and verified that I am speaking with the correct person using two identifiers.  Telephone was conducted via [place] interpreter. Pt MRN:  [id number] Pt DOB:  1945/11/13   History of Present Illness: This is a 73 year-old man returning for evaluation of seropositive myasthenia gravis.  He has been compliant with taking prednisone 20 mg daily and Mestinon 3 times daily. He does not have difficulty swallowing, talking, or facial weakness.  No double vision or droopiness of the eyes.   He does have painful cramps of the legs, which is worse upon awakening and often has to massage his legs for relief.  He also continues to complain of right shoulder pain, which limits his ability to raise the arm.  At his last visit, he was instructed to follow-up with his PCP as this seemed to be more musculoskeletal, however he has not been able to do this yet.    Observations/Objective:  Speech is clear and strong.    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  - Taper prednisone to 20mg  alternating with 15mg  daily x 2 weeks, then reduce to 15mg   daily  - Reduce Mestinon to 60mg  twice daily at 10am and 4pm due to muscle cramps, which may be medication induced  - Continue Pepcid 20 mg daily, calcium supplements   - Patient was informed to contact my office should he develop new facial/limb weakness, as his prednisone is being tapered  Follow Up Instructions:   I discussed the assessment and treatment plan with the patient. The patient was provided an opportunity to ask questions and all were answered. The patient agreed with the plan and demonstrated an understanding of the instructions.   The patient was advised to call back or seek an in-person evaluation if the symptoms worsen or if the condition fails to improve as anticipated.  Return to clinic in 1 month  Total Time spent in visit with the patient was:  20 min, of which 100% of the time was spent in counseling and/or coordinating care.   Pt understands and agrees with the plan of care outlined.     Alda Berthold, DO

## 2018-11-19 ENCOUNTER — Other Ambulatory Visit: Payer: Self-pay

## 2018-11-19 ENCOUNTER — Ambulatory Visit (INDEPENDENT_AMBULATORY_CARE_PROVIDER_SITE_OTHER): Payer: Medicare Other | Admitting: Neurology

## 2018-11-19 ENCOUNTER — Encounter: Payer: Self-pay | Admitting: Neurology

## 2018-11-19 VITALS — BP 142/72 | HR 93 | Wt 176.0 lb

## 2018-11-19 DIAGNOSIS — G7000 Myasthenia gravis without (acute) exacerbation: Secondary | ICD-10-CM

## 2018-11-19 DIAGNOSIS — Z7952 Long term (current) use of systemic steroids: Secondary | ICD-10-CM | POA: Diagnosis not present

## 2018-11-19 DIAGNOSIS — R252 Cramp and spasm: Secondary | ICD-10-CM

## 2018-11-19 MED ORDER — PREDNISONE 10 MG PO TABS: ORAL_TABLET | ORAL | 5 refills | Status: DC

## 2018-11-19 NOTE — Patient Instructions (Addendum)
Reduce prednisone to 15mg  daily (1.5 tablet)  Continue pyridostigmine 60mg  at 10am and 4pm  Stop famotidine  Return to clinic in 6 weeks

## 2018-11-19 NOTE — Progress Notes (Signed)
Follow-up Visit   Date: 24-Nov-2018   Danny Walker MRN: [id number] DOB: February 10, 1946   Interim History: Danny Walker is a 73 y.o. Spanish-speaking male with history of hypertension, prostatitis, liver mass, and urinary retention returning to the clinic for follow-up of seropositive myasthenia gravis.  The patient was accompanied to the clinic by Spanish interpretor who also provides collateral information.    History of present illness: Patient was hospitalized at [facility] from 11/11 - 04/12/2018 with 32-month history of progressive difficulty swallowing, facial weakness, vertical double vision, and 30lb weight loss.  Due to high clinical suspicion for myasthenia gravis, he was started on mestinon 60mg  TID with marked improvement.  He necessitated brief placement of NG tube for feeding and medications, which after 2 days was removed.  Testing included MRI brain showing chronic microischemic changes, serology testing which returned positive for AChR antibodies, and CT chest which did not show evidence of thymoma.  He was started on prednisone 20mg  and titrated to 60mg /d.  He has been on prednisone 60mg  daily and mestinon 60mg  four times daily (4am, 10am, 4pm, 10am) since November 2019.  Currently, he denies any problems with ptosis, diplopia, dysphagia, dysarthria, or limb weakness. He is back to eating normal textured foods and regained his weight.  He complains of feeling that his eyes are swollen. No blurred vision, itching, or double vision.   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  His wife passed away in 2016-08-19.  UPDATE Nov 24, 2018:  He is here for follow-up visit.  He remains on prednisone 20mg  daily and denies any breakthrough weakness, double vision, or ptosis.  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  He denies chest pain or  associated shortness of breath.  He is also having worsening muscle cramps at bedtime.    Medications:  Current Outpatient Medications on File Prior to Visit  Medication Sig Dispense Refill  . calcium-vitamin D (OSCAL WITH D) 500-200 MG-UNIT tablet Take 1 tablet by mouth 2 (two) times daily. 60 tablet 5  . fesoterodine (TOVIAZ) 4 MG TB24 tablet Take 4 mg by mouth daily.    . finasteride (PROSCAR) 5 MG tablet Take 1 tablet (5 mg total) by mouth every morning. 90 tablet 1  . lisinopril (PRINIVIL,ZESTRIL) 20 MG tablet Take 1 tablet (20 mg total) by mouth every morning. 90 tablet 1  . omeprazole (PRILOSEC) 40 MG capsule Take 1 capsule (40 mg total) by mouth every morning. 90 capsule 3  . polyvinyl alcohol (LIQUIFILM TEARS) 1.4 % ophthalmic solution Place 1 drop into both eyes as needed for dry eyes. 15 mL 0  . pyridostigmine (MESTINON) 60 MG tablet Take 1 tablet at 10am and 4pm 270 tablet 3  . Vitamin D, Ergocalciferol, (DRISDOL) 50000 units CAPS capsule Take 50,000 Units by mouth every 08-20-22.  2   No current facility-administered medications on file prior to visit.     Allergies: No Known Allergies  Review of Systems:  CONSTITUTIONAL: No fevers, chills, night sweats, or weight loss.  EYES: No visual changes or eye pain ENT: No hearing changes.  No history of nose bleeds.   RESPIRATORY: No cough, wheezing and shortness of breath.   CARDIOVASCULAR: Negative for chest pain, and palpitations.   GI: Negative for abdominal discomfort, blood in stools or  black stools.  No recent change in bowel habits.   GU:  No history of incontinence.   MUSCLOSKELETAL: +history of joint pain or swelling.  No myalgias.   SKIN: Negative for lesions, rash, and itching.   ENDOCRINE: Negative for cold or heat intolerance, polydipsia or goiter.   PSYCH:  No depression or anxiety symptoms.   NEURO: As Above.   Vital Signs:  BP (!) 142/72   Pulse 93   Wt 176 lb (79.8 kg)   SpO2 95%   BMI 30.69 kg/m    Neurological Exam: MENTAL STATUS including orientation to time, place, person, recent and remote memory, attention span and concentration, language, and fund of knowledge is normal.  Speech is not dysarthric.  CRANIAL NERVES:  No visual field defects.  Pupils equal round and reactive to light.  Normal conjugate, extra-ocular eye movements in all directions of gaze.  No ptosis at rest or with sustained upgaze.  Face is symmetric, facial muscles are 5/5 including orbicularis oculi, oris, and buccinator. Palate elevates symmetrically.  Tongue is midline and strength is 5/5.  MOTOR:  Motor strength is 5/5 in all extremities, no fatigability.  Neck flexion is 5/5.Marland Kitchen  No atrophy, fasciculations or abnormal movements.  No pronator drift.  Tone is normal.    COORDINATION/GAIT:  Easily able to stand up without using arms to push off.   Gait narrow based and stable.   Data: MRI brain wo contrast 04/03/2018: 1. Discontinued examination due to patient's cessation of choking. 2. No acute intracranial abnormality. Chronic small vessel ischemia.  CT chest w contrast 04/07/2018: No evidence of anterior mediastinal mass to suggest thymoma. No acute cardiopulmonary disease.  Labs 04/04/2018:  AChR binding 7.86*, blocking 65*, modulating 57*, TSH 1.77, CK 75, HIV neg, RPR neg   IMPRESSION/PLAN: 1.  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  Longterm steroid side effects              - Continue omeprazole 40mg  daily              - Continue calcium 1200 mg/day and vitamin D intake of 800 international units/day   3.   Muscle cramps  - Encouraged to stay well-hydrated  - Hopefully, reducing prednisone will help  - May need to consider reducing mestinon going forward  Return to clinic in 6 weeks  Greater than 50% of this 25 minute visit was spent in counseling, explanation of diagnosis, planning of further management, and coordination of care.   Thank you for allowing me to participate in patient's care.  If I can answer any additional questions, I would be pleased to do so.    Sincerely,     K. Posey Pronto, DO

## 2018-12-10 ENCOUNTER — Other Ambulatory Visit: Payer: Self-pay | Admitting: Internal Medicine

## 2018-12-10 DIAGNOSIS — I10 Essential (primary) hypertension: Secondary | ICD-10-CM

## 2018-12-10 NOTE — Telephone Encounter (Signed)
Last office visit 04/18/18  Next appt scheduled for 12/24/18 w/pcp

## 2018-12-17 ENCOUNTER — Other Ambulatory Visit: Payer: Self-pay | Admitting: Neurology

## 2018-12-19 ENCOUNTER — Encounter: Payer: Medicare Other | Admitting: Internal Medicine

## 2018-12-24 ENCOUNTER — Encounter (INDEPENDENT_AMBULATORY_CARE_PROVIDER_SITE_OTHER): Payer: Self-pay

## 2018-12-24 ENCOUNTER — Ambulatory Visit (INDEPENDENT_AMBULATORY_CARE_PROVIDER_SITE_OTHER): Payer: Medicare Other | Admitting: Internal Medicine

## 2018-12-24 ENCOUNTER — Other Ambulatory Visit: Payer: Self-pay

## 2018-12-24 VITALS — BP 148/79 | HR 70 | Temp 97.0°F | Wt 176.8 lb

## 2018-12-24 DIAGNOSIS — Z0000 Encounter for general adult medical examination without abnormal findings: Secondary | ICD-10-CM

## 2018-12-24 DIAGNOSIS — G7000 Myasthenia gravis without (acute) exacerbation: Secondary | ICD-10-CM

## 2018-12-24 DIAGNOSIS — I10 Essential (primary) hypertension: Secondary | ICD-10-CM | POA: Diagnosis present

## 2018-12-24 DIAGNOSIS — K219 Gastro-esophageal reflux disease without esophagitis: Secondary | ICD-10-CM

## 2018-12-24 DIAGNOSIS — N401 Enlarged prostate with lower urinary tract symptoms: Secondary | ICD-10-CM | POA: Diagnosis not present

## 2018-12-24 MED ORDER — LISINOPRIL 40 MG PO TABS: 40.0000 mg | ORAL_TABLET | Freq: Every day | ORAL | 1 refills | Status: DC

## 2018-12-24 NOTE — Patient Instructions (Addendum)
Danny Walker,  It was a pleasure to see you today. Thank you for coming in.   Today we discussed your chronic medical conditions.  Your blood pressure was a little high today, we have increase your lisinopril to 40 mg daily. Please RTC in 2-4 weeks to recheck your blood pressure.   We also discussed your myasthenia gravis. It looks like this is well controlled. Continue to follow up with the neurologist for this.   We have given you a pneumonia vaccine. We have given you stool cards to monitor you for colon cancer, please follow the instructions given to you and I will call you with the results.    Please return to clinic in 2-3 weeks or sooner if needed.   Thank you again for coming in.   [name] M.D.    Danny Walker,  Fue un placer verte hoy. Gracias por venir  Hoy discutimos sus condiciones mdicas crnicas.  Su presin arterial fue un poco alta hoy, hemos aumentado su lisinopril a 40 mg al da. RTC en 2-4 semanas para volver a controlar su presin arterial.  Tambin hablamos sobre su miastenia gravis. Parece que esto est bien controlado. Contine haciendo un seguimiento con el neurlogo para esto.  Le hemos dado una vacuna contra la neumona. Le hemos entregado tarjetas de heces para controlar su cncer de colon, siga las instrucciones que se le dieron y lo [place] con los [place].  Regrese a Copy en 2-3 semanas o antes si es necesario.  Gracias de nuevo por venir.  [name].

## 2018-12-24 NOTE — Progress Notes (Signed)
CC: Hypertension, myasthenia gravis, BPH, GERD, and health maintenance  HPI:  Danny Walker is a 73 y.o.  with a PMH listed below presenting for hypertension, myasthenia gravis, BPH, GERD, and health maintenance.  He reports that overall he is doing well, recently saw neurologist about a week ago and is taking the pyridostigmine and prednisone 10 mg daily.  He denies any complaints at this time.   Please see A&P for status of the patient's chronic medical conditions  Past Medical History:  Diagnosis Date  . BPH (benign prostatic hyperplasia)   . DDD (degenerative disc disease), lumbosacral   . Fatty liver   . Foley catheter in place   . History of gallstones   . History of prostatitis   . Hypertension   . IDA (iron deficiency anemia)   . Liver mass 09/17/2001   4.7cm , noted on [place] abd  . Low back pain   . Nocturia   . 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 Spondylosis Lumbar  . Umbilical hernia   . Urinary retention 02/08/2018   Review of Systems: Refer to history of present illness and assessment and plans for pertinent review of systems, all others reviewed and negative.  Physical Exam:  Vitals:   12/24/18 1355 12/24/18 1447  BP: (!) 171/101 (!) 148/79  Pulse:  70  Temp: (!) 97 F (36.1 C)   Weight: 176 lb 12.8 oz (80.2 kg)     Physical Exam  Constitutional: He is oriented to person, place, and time.  Elderly male, no acute distress  HENT:  Head: Normocephalic and atraumatic.  Eyes: Pupils are equal, round, and reactive to light. Conjunctivae are normal.  Neck: Normal range of motion. Neck supple.  Cardiovascular: Normal rate, regular rhythm and normal heart sounds.  Pulmonary/Chest: Effort normal and breath sounds normal. No respiratory distress.  Abdominal: Soft. Bowel sounds are normal.  Musculoskeletal: Normal range of motion.        General: No edema.  Neurological: He is alert and oriented to person, place, and time.  Gait normal.  Skin: Skin is warm and dry.    Social History   Socioeconomic History  . Marital status: Married    Spouse name: Not on file  . Number of children: 1  . Years of education: 90  . Highest education level: High school graduate  Occupational History  . Occupation: Airline pilot: [employer]  Social Needs  . Financial resource strain: Not on file  . Food insecurity    Worry: Not on file    Inability: Not on file  . Transportation needs    Medical: Not on file    Non-medical: Not on file  Tobacco Use  . Smoking status: Never Smoker  . Smokeless tobacco: Never Used  Substance and Sexual Activity  . Alcohol use: No  . Drug use: Never  . Sexual activity: Yes  Lifestyle  . Physical activity    Days per week: Not on file    Minutes per session: Not on file  . Stress: Not on file  Relationships  . Social Herbalist on phone: Not on file    Gets together: Not on file    Attends religious service: Not on file    Active member of club or organization: Not on file    Attends meetings of clubs or organizations: Not on file    Relationship status:  Not on file  . Intimate partner violence    Fear of current or ex partner: Not on file    Emotionally abused: Not on file    Physically abused: Not on file    Forced sexual activity: Not on file  Other Topics Concern  . Not on file  Social History Narrative   Lives with daughter in a one story home.  Education: high school.  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      Family History  Problem Relation Age of Onset  . Hypertension Mother   . Hypertension Father     Assessment & Plan:   See Encounters Tab for problem based charting.  Patient discussed with Dr. Dareen Piano

## 2018-12-25 ENCOUNTER — Encounter: Payer: Self-pay | Admitting: Internal Medicine

## 2018-12-25 DIAGNOSIS — Z0000 Encounter for general adult medical examination without abnormal findings: Secondary | ICD-10-CM | POA: Insufficient documentation

## 2018-12-25 MED ORDER — PREDNISONE 10 MG PO TABS: ORAL_TABLET | ORAL | 5 refills | Status: DC

## 2018-12-25 NOTE — Assessment & Plan Note (Signed)
Patient has a history of myasthenia gravis and follows with neurology, he states that he saw them last week he is currently on pyridostigmine 60 mg twice daily and prednisone 10 mg daily.  He denies any issues with weakness, vision changes, speech difficulty, difficulty swallowing, or other symptoms at this time.  On exam he has no focal neurological deficits. Overall he seems to be stable in regards to this diagnosis.   -Continue to follow with neurology -Continue pyridostigmine 60 mg BID -Continue prednisone 10 mg daily

## 2018-12-25 NOTE — Assessment & Plan Note (Signed)
Patient was given pneumonia vaccine.  We discussed obtaining a colonoscopy, he states that he had one less than 10 years ago however is unsure when he had it and he states that it was normal.  Discussed obtaining a FIT test and he was in agreement with this.

## 2018-12-25 NOTE — Assessment & Plan Note (Signed)
Patient is currently on Finasteride and Toviaz.  He continues to follow with urology.  Denies any issues with urination at this time. Condition is chronic and stable.

## 2018-12-25 NOTE — Assessment & Plan Note (Signed)
Patient is currently on lisinopril 20 mg daily.  Blood pressure was initially elevated to 171/101, repeat blood pressure improved down to 148/79, still elevated. He denied any issues taking his medications, he does check his blood pressures at home a few times a week and thinks that it is normally around 161 systolic.  Denied any lightheadedness, chest pain, shortness of breath, or other issues.  Discussed increasing his lisinopril, and he is in agreement.  Plan: - Increase lisinopril to 40 mg daily - Return to clinic in 2 to 4 weeks to recheck blood pressure and BMP

## 2018-12-25 NOTE — Progress Notes (Signed)
Internal Medicine Clinic Attending  Case discussed with Dr. Krienke at the time of the visit.  We reviewed the resident's history and exam and pertinent patient test results.  I agree with the assessment, diagnosis, and plan of care documented in the resident's note.    

## 2018-12-31 ENCOUNTER — Encounter: Payer: Self-pay | Admitting: Neurology

## 2018-12-31 ENCOUNTER — Ambulatory Visit (INDEPENDENT_AMBULATORY_CARE_PROVIDER_SITE_OTHER): Payer: Medicare Other | Admitting: Neurology

## 2018-12-31 ENCOUNTER — Other Ambulatory Visit: Payer: Self-pay

## 2018-12-31 VITALS — BP 137/80 | HR 96 | Temp 98.0°F | Ht 63.5 in | Wt 182.8 lb

## 2018-12-31 DIAGNOSIS — R252 Cramp and spasm: Secondary | ICD-10-CM

## 2018-12-31 DIAGNOSIS — G7000 Myasthenia gravis without (acute) exacerbation: Secondary | ICD-10-CM

## 2018-12-31 DIAGNOSIS — Z7952 Long term (current) use of systemic steroids: Secondary | ICD-10-CM

## 2018-12-31 NOTE — Progress Notes (Signed)
Follow-up Visit   Date: 01/14/19   Danny Walker MRN: [id number] DOB: 03/19/1946   Interim History: Danny Walker is a 73 y.o. Spanish-speaking male with history of hypertension, prostatitis, liver mass, and urinary retention returning to the clinic for follow-up of seropositive myasthenia gravis.  The patient was accompanied to the clinic by self.  Phone Spanish interpretor was used for this visit.  History of present illness: Patient was hospitalized at [facility] from 11/11 - 04/12/2018 with 82-month history of progressive difficulty swallowing, facial weakness, vertical double vision, and 30lb weight loss.  Due to high clinical suspicion for myasthenia gravis, he was started on mestinon 60mg  TID with marked improvement.  He necessitated brief placement of NG tube for feeding and medications, which after 2 days was removed.  Testing included MRI brain showing chronic microischemic changes, serology testing which returned positive for AChR antibodies, and CT chest which did not show evidence of thymoma.  He was started on prednisone 20mg  and titrated to 60mg /d.  He has been on prednisone 60mg  daily and mestinon 60mg  four times daily (4am, 10am, 4pm, 10am) since November 2019.  Currently, he denies any problems with ptosis, diplopia, dysphagia, dysarthria, or limb weakness. He is back to eating normal textured foods and regained his weight.  He complains of feeling that his eyes are swollen. No blurred vision, itching, or double vision.   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  His wife passed away in 08-28-16.   UPDATE Jan 14, 2019:  He is ere for follow-up visit.  He has been on prednisone 15mg  daily without breakthrough weakness, double vision, or ptosis.  He denies any new complaints.  His leg cramps have also improved in severity and only occur seldom now.   Medications:  Current Outpatient  Medications on File Prior to Visit  Medication Sig Dispense Refill  . calcium-vitamin D (OSCAL WITH D) 500-200 MG-UNIT TABS tablet TOME UNA TABLETA DOS VECES AL DIA 180 tablet 1  . fesoterodine (TOVIAZ) 4 MG TB24 tablet Take 4 mg by mouth daily.    . finasteride (PROSCAR) 5 MG tablet Take 1 tablet (5 mg total) by mouth every morning. 90 tablet 1  . lisinopril (ZESTRIL) 40 MG tablet Take 1 tablet (40 mg total) by mouth daily. 30 tablet 1  . omeprazole (PRILOSEC) 40 MG capsule Take 1 capsule (40 mg total) by mouth every morning. 90 capsule 3  . polyvinyl alcohol (LIQUIFILM TEARS) 1.4 % ophthalmic solution Place 1 drop into both eyes as needed for dry eyes. 15 mL 0  . predniSONE (DELTASONE) 10 MG tablet 10 mg daily. 60 tablet 5  . pyridostigmine (MESTINON) 60 MG tablet Take 1 tablet at 10am and 4pm 270 tablet 3  . Vitamin D, Ergocalciferol, (DRISDOL) 50000 units CAPS capsule Take 50,000 Units by mouth every 2022/08/29.  2   No current facility-administered medications on file prior to visit.     Allergies: No Known Allergies  Review of Systems:  CONSTITUTIONAL: No fevers, chills, night sweats, or weight loss.  EYES: No visual changes or eye pain ENT: No hearing changes.  No history of nose bleeds.   RESPIRATORY: No cough, wheezing and shortness of breath.   CARDIOVASCULAR: Negative for chest pain, and palpitations.   GI: Negative for abdominal discomfort, blood in stools or black stools.  No recent change in bowel habits.   GU:  No  history of incontinence.   MUSCLOSKELETAL: +history of joint pain or swelling.  No myalgias.   SKIN: Negative for lesions, rash, and itching.   ENDOCRINE: Negative for cold or heat intolerance, polydipsia or goiter.   PSYCH:  No depression or anxiety symptoms.   NEURO: As Above.   Vital Signs:  BP 137/80 Comment: used interpreter language line Shea Stakes to interpret for visit  Pulse 96   Temp 98 F (36.7 C)   Ht 5' 3.5" (1.613 m)   Wt 182 lb 12.8 oz (82.9 kg)    SpO2 95%   BMI 31.87 kg/m   Neurological Exam: MENTAL STATUS including orientation to time, place, person, recent and remote memory, attention span and concentration, language, and fund of knowledge is normal.  Speech is not dysarthric.  CRANIAL NERVES:  No visual field defects.  Pupils equal round and reactive to light.  Normal conjugate, extra-ocular eye movements in all directions of gaze.  No ptosis at rest or with sustained upgaze.  Face is symmetric, facial muscles are 5/5 including orbicularis oculi, oris, and buccinator. Palate elevates symmetrically.  Tongue is midline and strength is 5/5.  MOTOR:  Motor strength is 5/5 in all extremities, no fatigability.   COORDINATION/GAIT:  Easily able to stand up without using arms to push off.   Gait narrow based and stable.   Data: MRI brain wo contrast 04/03/2018: 1. Discontinued examination due to patient's cessation of choking. 2. No acute intracranial abnormality. Chronic small vessel ischemia.  CT chest w contrast 04/07/2018: No evidence of anterior mediastinal mass to suggest thymoma. No acute cardiopulmonary disease.  Labs 04/04/2018:  AChR binding 7.86*, blocking 65*, modulating 57*, TSH 1.77, CK 75, HIV neg, RPR neg   IMPRESSION/PLAN: 1.  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  Longterm steroid side effects              - Continue omeprazole 40mg  daily              - Continue calcium 1200 mg/day and vitamin D intake of 800 international units/day   3.  Muscle cramps, improved and likely due to prednisone  Return to clinic in 8 weeks  Greater than 50% of this 25 minute visit was spent in counseling,  explanation of diagnosis, planning of further management, and coordination of care.   Thank you for allowing me to participate in patient's care.  If I can answer any additional questions, I would be pleased to do so.    Sincerely,    Valla Pacey K. Posey Pronto, DO

## 2018-12-31 NOTE — Patient Instructions (Signed)
Reduce prednisone 10mg  alternating with 15mg  x 1 month  After one month, reduce to prednisone 10mg  daily  Return to clinic on October 12th at 9:30am

## 2019-01-13 NOTE — Progress Notes (Signed)
CC: Right ankle pain and HTN  HPI:  [name] is a 73 y.o.  with a PMH listed below presenting for right ankle pain and HTN. He reports that about 10 days ago while he was at work he tripped on himself in hurt his ankle, he states that he has been having right ankle pain for the past 10 days that is only present when he is walking or standing on it for too long, he has tried aspirin but states that this is not helped.  He is also tried ice on the area which helps his symptoms.  Denies any swelling, skin changes, warmth, or other symptoms.  Please see A&P for status of the patient's chronic medical conditions  Past Medical History:  Diagnosis Date  . BPH (benign prostatic hyperplasia)   . DDD (degenerative disc disease), lumbosacral   . Fatty liver   . Foley catheter in place   . History of gallstones   . History of prostatitis   . Hypertension   . IDA (iron deficiency anemia)   . Liver mass 09/17/2001   4.7cm , noted on [place] abd  . Low back pain   . Nocturia   . 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 Spondylosis Lumbar  . Umbilical hernia   . Urinary retention 02/08/2018   Review of Systems: Refer to history of present illness and assessment and plans for pertinent review of systems, all others reviewed and negative.  Physical Exam:  Vitals:   01/14/19 1439  BP: (!) 163/73  Pulse: (!) 105  Temp: 97.9 F (36.6 C)  TempSrc: Oral  SpO2: 98%  Weight: 174 lb 8 oz (79.2 kg)  Height: 5' 3.5" (1.613 m)    Physical Exam  Constitutional: He is oriented to person, place, and time and well-developed, well-nourished, and in no distress.  Eyes: Pupils are equal, round, and reactive to light. Conjunctivae and EOM are normal.  Cardiovascular: Normal rate, regular rhythm and normal heart sounds.  Pulmonary/Chest: Effort normal and breath sounds normal. No respiratory distress.  Abdominal: Soft. Bowel sounds are normal. He exhibits no distension.   Musculoskeletal: Normal range of motion.        General: No tenderness or edema.     Comments: No TTP over right ankle, no swelling, warmth, erythema, or rash.   Neurological: He is alert and oriented to person, place, and time.  Skin: Skin is warm and dry.  Psychiatric: Mood and affect normal.    Social History   Socioeconomic History  . Marital status: Married    Spouse name: Not on file  . Number of children: 1  . Years of education: 24  . Highest education level: High school graduate  Occupational History  . Occupation: Airline pilot: [employer]  Social Needs  . Financial resource strain: Not on file  . Food insecurity    Worry: Not on file    Inability: Not on file  . Transportation needs    Medical: Not on file    Non-medical: Not on file  Tobacco Use  . Smoking status: Never Smoker  . Smokeless tobacco: Never Used  Substance and Sexual Activity  . Alcohol use: No  . Drug use: Never  . Sexual activity: Yes  Lifestyle  . Physical activity    Days per week: Not on file    Minutes per session: Not on file  . Stress: Not  on file  Relationships  . Social Herbalist on phone: Not on file    Gets together: Not on file    Attends religious service: Not on file    Active member of club or organization: Not on file    Attends meetings of clubs or organizations: Not on file    Relationship status: Not on file  . Intimate partner violence    Fear of current or ex partner: Not on file    Emotionally abused: Not on file    Physically abused: Not on file    Forced sexual activity: Not on file  Other Topics Concern  . Not on file  Social History Narrative   Lives with daughter in a one story home.  Education: high school.  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      Family History  Problem Relation Age of Onset  . Hypertension Mother   . Hypertension Father     Assessment & Plan:   See Encounters Tab for problem based  charting.  Patient discussed with Dr. Daryll Drown

## 2019-01-14 ENCOUNTER — Ambulatory Visit (INDEPENDENT_AMBULATORY_CARE_PROVIDER_SITE_OTHER): Payer: Medicare Other | Admitting: Internal Medicine

## 2019-01-14 ENCOUNTER — Other Ambulatory Visit: Payer: Self-pay

## 2019-01-14 ENCOUNTER — Encounter: Payer: Self-pay | Admitting: Internal Medicine

## 2019-01-14 VITALS — BP 157/78 | HR 97 | Temp 97.9°F | Ht 63.5 in | Wt 174.5 lb

## 2019-01-14 DIAGNOSIS — S93491A Sprain of other ligament of right ankle, initial encounter: Secondary | ICD-10-CM

## 2019-01-14 DIAGNOSIS — I10 Essential (primary) hypertension: Secondary | ICD-10-CM | POA: Diagnosis present

## 2019-01-14 DIAGNOSIS — S93401A Sprain of unspecified ligament of right ankle, initial encounter: Secondary | ICD-10-CM | POA: Insufficient documentation

## 2019-01-14 DIAGNOSIS — W1849XA Other slipping, tripping and stumbling without falling, initial encounter: Secondary | ICD-10-CM

## 2019-01-14 DIAGNOSIS — Z79899 Other long term (current) drug therapy: Secondary | ICD-10-CM | POA: Diagnosis not present

## 2019-01-14 MED ORDER — MELOXICAM 7.5 MG PO TABS: 7.5000 mg | ORAL_TABLET | Freq: Every day | ORAL | 0 refills | Status: AC | PRN

## 2019-01-14 NOTE — Assessment & Plan Note (Signed)
Patient reports that 10 days ago he tripped over himself at work and hurt his right ankle, he has been able to ambulate on the ankle but states it is painful when he stands for too long or walks on it.  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  Pain is on the medial aspect of his ankle, points between his medial malleolus and Achilles tendon. He denies any redness, swelling, warmth, or rash, he denies ever having this before.  He has tried ice and uses an ankle brace which helps his symptoms and tried aspirin which he states does not help.  On exam he has no erythema, edema, warmth, or rash, no tenderness over either malleolus or any bone.  Ankle fracture is ruled out by the [facility] rules, no imaging required at this time.  Plan: -Trial of meloxicam as needed - Advised patient to continue using ankle brace as needed -Advised patient to return to clinic if symptoms worsen or do not improve

## 2019-01-14 NOTE — Assessment & Plan Note (Signed)
Patient is currently on lisinopril 40 mg daily, he denies any issues taking this medication.  Blood pressure today is elevated at 163/73 repeat blood pressure was 157/78.  He reports that he checks his blood pressures at home and they are between 125-135/63-76.  Blood pressure may be elevated due to the walk and being here hospital.  Will hold off on adjusting blood pressure medication at this time.  -Continue lisinopril 40 mg daily -Check BMP

## 2019-01-14 NOTE — Patient Instructions (Addendum)
[name],  It was a pleasure to see you today. Thank you for coming in.   Today we discussed your right ankle pain and blood pressure.   In regards to this please continue using the ankle brace and ice on your ankle.  This seems like it may be a sprain.  I have sent in a prescription for meloxicam, you can take this for the next 14 days as needed.  We also discussed your blood pressure. Please continue taking lisinopril 40 mg daily, continue checking your blood pressures at home and recording them.   We have checked some labs, I will call that you if they are abnormal.  Please return to clinic in 3 months or sooner if needed.   Thank you again for coming in.   [name] M.D.   [name],  Fue un placer verte hoy. Gracias por venir.  Hoy hablamos sobre su dolor en el tobillo derecho y la presin arterial.  Con respecto a esto, contine usando la [place] y el hielo en su tobillo. Esto parece que puede ser un esguince. He enviado una receta para meloxicam, puede tomar esto durante los prximos 14 das segn sea necesario.  Tambin hablamos de su presin arterial. Contine tomando lisinopril 40 mg al da, contine controlando su presin arterial en casa y registrndola.  Hemos revisado algunos laboratorios, te llamar si son anormales.  Regrese a Copy en 3 meses o antes si es necesario.  Gracias de nuevo por venir.  [name].

## 2019-01-15 NOTE — Progress Notes (Signed)
Internal Medicine Clinic Attending  Case discussed with Dr. Krienke at the time of the visit.  We reviewed the resident's history and exam and pertinent patient test results.  I agree with the assessment, diagnosis, and plan of care documented in the resident's note.    

## 2019-01-20 ENCOUNTER — Other Ambulatory Visit: Payer: Self-pay | Admitting: Internal Medicine

## 2019-01-20 DIAGNOSIS — I10 Essential (primary) hypertension: Secondary | ICD-10-CM

## 2019-01-21 ENCOUNTER — Other Ambulatory Visit: Payer: Self-pay | Admitting: Internal Medicine

## 2019-01-21 DIAGNOSIS — S93491A Sprain of other ligament of right ankle, initial encounter: Secondary | ICD-10-CM

## 2019-01-29 ENCOUNTER — Encounter: Payer: Self-pay | Admitting: *Deleted

## 2019-01-29 NOTE — Progress Notes (Unsigned)
Left message for patient to call the [facility] on 01/29/19 at 3:00pm.  Vickie Barrow,PBT [facility] 

## 2019-01-30 ENCOUNTER — Encounter: Payer: Self-pay | Admitting: *Deleted

## 2019-01-30 NOTE — Progress Notes (Unsigned)
Called patient on home phone number and tried to ask about his stool specimen and he said he did not speak [place].  I asked if there was anyone in the home that spoke [place] and patient said no.  Used [place], [place] [id number], to call patient on home phone number and mobile number and the calls automatically went to voicemail.  We are unable to process IFOBT Kit due to no date listed on specimen.  If not resolved by 5:00pm today, recollection will be necessary.   Maxine Glenn [facility]

## 2019-01-31 ENCOUNTER — Encounter: Payer: Self-pay | Admitting: *Deleted

## 2019-01-31 NOTE — Progress Notes (Unsigned)
Patient's stepdaughter called today and I explained the circumstances of his stool specimen.  I told her we would mail him another kit and to have him collect the specimen again.  Also told her that the kit has instructions in Spanish.  She voiced understanding.   Maxine Glenn [facility]

## 2019-02-14 ENCOUNTER — Other Ambulatory Visit: Payer: Self-pay | Admitting: Neurology

## 2019-03-04 ENCOUNTER — Other Ambulatory Visit: Payer: Self-pay

## 2019-03-04 ENCOUNTER — Ambulatory Visit (INDEPENDENT_AMBULATORY_CARE_PROVIDER_SITE_OTHER): Payer: Medicare Other | Admitting: Neurology

## 2019-03-04 ENCOUNTER — Encounter: Payer: Self-pay | Admitting: Neurology

## 2019-03-04 ENCOUNTER — Other Ambulatory Visit: Payer: Self-pay | Admitting: Neurology

## 2019-03-04 VITALS — BP 130/80 | HR 74 | Ht 63.5 in | Wt 171.0 lb

## 2019-03-04 DIAGNOSIS — G7000 Myasthenia gravis without (acute) exacerbation: Secondary | ICD-10-CM | POA: Diagnosis not present

## 2019-03-04 DIAGNOSIS — R252 Cramp and spasm: Secondary | ICD-10-CM

## 2019-03-04 NOTE — Progress Notes (Signed)
Follow-up Visit   Date: 03/04/19   Danny Walker MRN: [id number] DOB: [birth year]   Interim History: Danny Walker is a 73 y.o. Spanish-speaking male with history of hypertension, prostatitis, liver mass, and urinary retention returning to the clinic for follow-up of seropositive myasthenia gravis.  The patient was accompanied to the clinic by self.  Spanish interpretor was used for this visit.  History of present illness: Patient was hospitalized at [facility] from 11/11 - 04/12/2018 with 10-month history of progressive difficulty swallowing, facial weakness, vertical double vision, and 30lb weight loss.  Due to high clinical suspicion for myasthenia gravis, he was started on mestinon 60mg  TID with marked improvement.  He necessitated brief placement of NG tube for feeding and medications, which after 2 days was removed.  Testing included MRI brain showing chronic microischemic changes, serology testing which returned positive for AChR antibodies, and CT chest which did not show evidence of thymoma.  He was started on prednisone 20mg  and titrated to 60mg /d.  He has been on prednisone 60mg  daily and mestinon 60mg  four times daily (4am, 10am, 4pm, 10am) since November 2019.  Currently, he denies any problems with ptosis, diplopia, dysphagia, dysarthria, or limb weakness. He is back to eating normal textured foods and regained his weight.  He complains of feeling that his eyes are swollen. No blurred vision, itching, or double vision.   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  His wife passed away in 2016-08-20.   UPDATE 10/12.2020:  He has been able to reduce prednisone down to 10mg  daily and is tolerating it well.  He continues to have leg pain and cramps at night time.   He is taking mestinon 60mg  at 3pm and 10pm, but reports usually going to bed around 10p-midnight.  He denies any double vision, eye  droopiness, leg weakness, difficulty swallowing/talking.  He continues to work full-time at [employer].  Medications:  Current Outpatient Medications on File Prior to Visit  Medication Sig Dispense Refill  . calcium-vitamin D (OSCAL WITH D) 500-200 MG-UNIT TABS tablet TOME UNA TABLETA DOS VECES AL DIA 180 tablet 1  . fesoterodine (TOVIAZ) 4 MG TB24 tablet Take 4 mg by mouth daily.    Marland Kitchen lisinopril (ZESTRIL) 40 MG tablet TOME UNA TABLETA TODOS LOS DIAS 30 tablet 5  . omeprazole (PRILOSEC) 40 MG capsule Take 1 capsule (40 mg total) by mouth every morning. 90 capsule 3  . polyvinyl alcohol (LIQUIFILM TEARS) 1.4 % ophthalmic solution Place 1 drop into both eyes as needed for dry eyes. 15 mL 0  . predniSONE (DELTASONE) 10 MG tablet Take 10 mg by mouth daily with breakfast.    . pyridostigmine (MESTINON) 60 MG tablet Take 1 tablet at 10am and 4pm 270 tablet 3  . finasteride (PROSCAR) 5 MG tablet Take 1 tablet (5 mg total) by mouth every morning. 90 tablet 1  . predniSONE (DELTASONE) 10 MG tablet TOME DOS TABLETAS POR VIA ORAL TODOS LOS DIAS 60 tablet 5  . Vitamin D, Ergocalciferol, (DRISDOL) 50000 units CAPS capsule Take 50,000 Units by mouth every Monday.  2   No current facility-administered medications on file prior to visit.     Allergies: No Known Allergies  Review of Systems:  CONSTITUTIONAL: No fevers, chills, night sweats, or weight loss.  EYES: No visual changes or eye pain ENT: No hearing changes.  No history of nose bleeds.  RESPIRATORY: No cough, wheezing and shortness of breath.   CARDIOVASCULAR: Negative for chest pain, and palpitations.   GI: Negative for abdominal discomfort, blood in stools or black stools.  No recent change in bowel habits.   GU:  No history of incontinence.   MUSCLOSKELETAL: No history of joint pain or swelling.  No myalgias.   SKIN: Negative for lesions, rash, and itching.   ENDOCRINE: Negative for cold or heat intolerance, polydipsia or goiter.   PSYCH:  No  depression or anxiety symptoms.   NEURO: As Above.   Vital Signs:  BP 130/80   Pulse 74   Wt 171 lb (77.6 kg)   SpO2 98%   BMI 29.82 kg/m   Neurological Exam: MENTAL STATUS including orientation to time, place, person, recent and remote memory, attention span and concentration, language, and fund of knowledge is normal.  Speech is not dysarthric.  CRANIAL NERVES:  Normal conjugate, extra-ocular eye movements in all directions of gaze.  No ptosis at rest or with sustained upgaze.  Face is symmetric, facial muscles are 5/5 including orbicularis oculi, oris, and buccinator. Palate elevates symmetrically.  Tongue is midline and strength is 5/5.  MOTOR:  Motor strength is 5/5 in all extremities, no fatigability. Normal tone  COORDINATION/GAIT:  Easily able to stand up without using arms to push off.   Gait narrow based and stable.   Data: MRI brain wo contrast 04/03/2018: 1. Discontinued examination due to patient's cessation of choking. 2. No acute intracranial abnormality. Chronic small vessel ischemia.  CT chest w contrast 04/07/2018: No evidence of anterior mediastinal mass to suggest thymoma. No acute cardiopulmonary disease.  Labs 04/04/2018:  AChR binding 7.86*, blocking 65*, modulating 57*, TSH 1.77, CK 75, HIV neg, RPR neg   IMPRESSION/PLAN: 1.  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  Longterm steroid side effects              - Continue omeprazole 40mg  daily              - Continue calcium 1200 mg/day and vitamin D intake of 800 international units/day   3.  Muscle cramps, hopefully adjusting the dose of mestinon will help alleviate symptoms.   Return to clinic in 2 months  Greater than 50% of this 15 minute visit was spent in counseling, explanation of diagnosis,  planning of further management, and coordination of care.   Thank you for allowing me to participate in patient's care.  If I can answer any additional questions, I would be pleased to do so.    Sincerely,    Amrita Radu K. Posey Pronto, DO

## 2019-03-04 NOTE — Patient Instructions (Addendum)
Continue prednisone 10mg  daily  Reduce pyridostigmine to 30mg  (half tab) at 7am and 3pm  I will see you back on November 30th at 8:30a

## 2019-04-01 NOTE — Addendum Note (Signed)
Addended by: [name] on: 04/01/2019 04:47 PM   Modules accepted: Orders

## 2019-04-12 NOTE — Progress Notes (Signed)
CC: Hypertension, myasthenia gravis, and left upper arm pain/numbness  HPI:  [name] is a 73 y.o.  with a PMH listed below presenting for hypertension, myasthenia gravis, and left upper arm pain/numbness.    Please see A&P for status of the patient's chronic medical conditions  Past Medical History:  Diagnosis Date  . BPH (benign prostatic hyperplasia)   . DDD (degenerative disc disease), lumbosacral   . Fatty liver   . Foley catheter in place   . History of gallstones   . History of prostatitis   . Hypertension   . IDA (iron deficiency anemia)   . Liver mass 09/17/2001   4.7cm , noted on [place] abd  . Low back pain   . Nocturia   . 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 Spondylosis Lumbar  . Umbilical hernia   . Urinary retention 02/08/2018   Review of Systems: Refer to history of present illness and assessment and plans for pertinent review of systems, all others reviewed and negative.  Physical Exam:  Vitals:   04/15/19 1308  BP: (!) 147/69  Pulse: 64  Temp: 98.2 F (36.8 C)  TempSrc: Oral  SpO2: 98%  Weight: 169 lb 1.6 oz (76.7 kg)    Physical Exam  Constitutional: He is oriented to person, place, and time and well-developed, well-nourished, and in no distress.  HENT:  Head: Normocephalic and atraumatic.  Eyes: Pupils are equal, round, and reactive to light.  Cardiovascular: Normal rate, regular rhythm and normal heart sounds.  Pulmonary/Chest: Effort normal and breath sounds normal. No respiratory distress.  Abdominal: Soft. Bowel sounds are normal. He exhibits no distension.  Musculoskeletal:        General: Tenderness (mild TTP over left forearm and biceps area, no erythema, edema, or warmth noted) present.  Neurological: He is alert and oriented to person, place, and time. He displays normal reflexes. No cranial nerve deficit. He exhibits normal muscle tone. Coordination normal.  Skin: Skin is warm and dry.   Psychiatric: Mood and affect normal.    Social History   Socioeconomic History  . Marital status: Married    Spouse name: Not on file  . Number of children: 1  . Years of education: 28  . Highest education level: High school graduate  Occupational History  . Occupation: Airline pilot: [employer]  Social Needs  . Financial resource strain: Not on file  . Food insecurity    Worry: Not on file    Inability: Not on file  . Transportation needs    Medical: Not on file    Non-medical: Not on file  Tobacco Use  . Smoking status: Never Smoker  . Smokeless tobacco: Never Used  Substance and Sexual Activity  . Alcohol use: No  . Drug use: Never  . Sexual activity: Yes  Lifestyle  . Physical activity    Days per week: Not on file    Minutes per session: Not on file  . Stress: Not on file  Relationships  . Social Herbalist on phone: Not on file    Gets together: Not on file    Attends religious service: Not on file    Active member of club or organization: Not on file    Attends meetings of clubs or organizations: Not on file    Relationship status: Not on file  . Intimate partner violence    Fear  of current or ex partner: Not on file    Emotionally abused: Not on file    Physically abused: Not on file    Forced sexual activity: Not on file  Other Topics Concern  . Not on file  Social History Narrative   Lives with daughter in a one story home.  Education: high school.  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     Right handed    Family History  Problem Relation Age of Onset  . Hypertension Mother   . Hypertension Father     Assessment & Plan:   See Encounters Tab for problem based charting.  Patient discussed with Dr. Lynnae January

## 2019-04-15 ENCOUNTER — Other Ambulatory Visit: Payer: Self-pay

## 2019-04-15 ENCOUNTER — Encounter: Payer: Self-pay | Admitting: Internal Medicine

## 2019-04-15 ENCOUNTER — Ambulatory Visit (INDEPENDENT_AMBULATORY_CARE_PROVIDER_SITE_OTHER): Payer: Medicare Other | Admitting: Internal Medicine

## 2019-04-15 VITALS — BP 147/69 | HR 64 | Temp 98.2°F | Wt 169.1 lb

## 2019-04-15 DIAGNOSIS — Z7952 Long term (current) use of systemic steroids: Secondary | ICD-10-CM | POA: Diagnosis not present

## 2019-04-15 DIAGNOSIS — Z0000 Encounter for general adult medical examination without abnormal findings: Secondary | ICD-10-CM

## 2019-04-15 DIAGNOSIS — I10 Essential (primary) hypertension: Secondary | ICD-10-CM

## 2019-04-15 DIAGNOSIS — G7000 Myasthenia gravis without (acute) exacerbation: Secondary | ICD-10-CM

## 2019-04-15 DIAGNOSIS — M79602 Pain in left arm: Secondary | ICD-10-CM

## 2019-04-15 DIAGNOSIS — Z79899 Other long term (current) drug therapy: Secondary | ICD-10-CM

## 2019-04-15 NOTE — Patient Instructions (Signed)
[name],  It was a pleasure to see you today. Thank you for coming in.   Today we discussed your blood pressure, myasthenia gravis, and other medical conditions.   We also discussed your arm weakness and numbness. This could be related to a muscle injury, please try taking over the counter tylenol or ibuprofen. If it is still bothering you in a few weeks please let us know.     Please return to clinic in 6 months or sooner if needed.   Thank you again for coming in.   [name] M.D.  [name],  Fue un placer verte hoy. Gracias por venir  [name] su presin arterial, miastenia gravis y otras afecciones mdicas.  Tambin hablamos de la debilidad y el entumecimiento de su brazo. Esto podra estar relacionado con una lesin muscular, intente tomar tylenol o ibuprofeno de venta libre. Si todava le [employer], hganoslo saber.  Regrese a Copy en 6 meses o antes si es necesario.  Gracias de nuevo por venir.  [name].

## 2019-04-16 ENCOUNTER — Encounter: Payer: Self-pay | Admitting: Internal Medicine

## 2019-04-16 DIAGNOSIS — M79602 Pain in left arm: Secondary | ICD-10-CM | POA: Insufficient documentation

## 2019-04-16 LAB — BMP8+ANION GAP
Anion Gap: 14 mmol/L (ref 10.0–18.0)
BUN/Creatinine Ratio: 23 (ref 10–24)
BUN: 22 mg/dL (ref 8–27)
CO2: 21 mmol/L (ref 20–29)
Calcium: 9.1 mg/dL (ref 8.6–10.2)
Chloride: 107 mmol/L — ABNORMAL HIGH (ref 96–106)
Creatinine, Ser: 0.94 mg/dL (ref 0.76–1.27)
GFR calc Af Amer: 93 mL/min/{1.73_m2} (ref >59)
GFR calc non Af Amer: 81 mL/min/{1.73_m2} (ref >59)
Glucose: 81 mg/dL (ref 65–99)
Potassium: 3.7 mmol/L (ref 3.5–5.2)
Sodium: 142 mmol/L (ref 134–144)

## 2019-04-16 MED ORDER — PYRIDOSTIGMINE BROMIDE 60 MG PO TABS: ORAL_TABLET | ORAL | 3 refills | Status: DC

## 2019-04-16 NOTE — Assessment & Plan Note (Signed)
Patient reports that he had received a pneumonia vaccine recently, noted that he received it on his last visit however no documented vaccine. He also has issues with the FIT test, discussed that we will need to try and repeat this.

## 2019-04-16 NOTE — Assessment & Plan Note (Signed)
Patient reports that he has been having this left upper extremity paain/numbness, he states that it started about 1 week ago. He noticed that his hand feels cold and he starts having some dull pain that radiates up to his biceps area. He reports that it occurs when he is at work, works at [employer], when he is lifting objects and moving boxes around. He denies any weakness in the muscle. On exam he has good radial pulses, temperature is warm and equal, mild TTP over biceps and forearm, no erythema, edema, or warmth noted. Given the tenderness to palpation it's possible that this is related to a muscle strain. Initially discussed trying him on robaxin however after discussing the side effects and concern for sedation at work we decided we would hold off on this.  -Advised him to take over the counter ibuprofen or tylenol and continue to monitor for now

## 2019-04-16 NOTE — Assessment & Plan Note (Signed)
Patient is currently on pyridostigmine 30 mg BID and prednisone 10 mg daily. He last saw neurology 1 month ago, he was reporting some muscle cramps at that time and his pyridostigmine was decreased to 30 mg BID, he reports that his leg cramps have resolved. He denies any vision issues, weakness, swallowing issues, trouble with speech, or chewing.   -Continue pyridostigmine 30 mg BID -Continue prednisone 10 mg daily -Following with neurology

## 2019-04-16 NOTE — Assessment & Plan Note (Signed)
Patient is on lisinopril 40 mg daily at home, he denies any issues taking his medications. He checks his BP at home every 1-2 days, brought in paper that showed BP ranging around 130/77. BP today is elevated at 147/69. Last BMP in 03/2018 was normal. Given his home monitoring numbers will hold off on adjusting medications.  -Repeat BMP today -Continue lisinopril 40 mg daily

## 2019-04-18 NOTE — Progress Notes (Signed)
Internal Medicine Clinic Attending  Case discussed with Dr. Krienke at the time of the visit.  We reviewed the resident's history and exam and pertinent patient test results.  I agree with the assessment, diagnosis, and plan of care documented in the resident's note.    

## 2019-04-22 ENCOUNTER — Encounter: Payer: Self-pay | Admitting: Neurology

## 2019-04-22 ENCOUNTER — Ambulatory Visit (INDEPENDENT_AMBULATORY_CARE_PROVIDER_SITE_OTHER): Payer: Medicare Other | Admitting: Neurology

## 2019-04-22 ENCOUNTER — Other Ambulatory Visit: Payer: Self-pay

## 2019-04-22 VITALS — BP 144/82 | HR 77 | Ht 66.0 in | Wt 169.0 lb

## 2019-04-22 DIAGNOSIS — G7000 Myasthenia gravis without (acute) exacerbation: Secondary | ICD-10-CM

## 2019-04-22 NOTE — Patient Instructions (Signed)
February 1st at 8:30am

## 2019-04-22 NOTE — Progress Notes (Signed)
Follow-up Visit   Date: 04/22/19   Danny Walker MRN: [id number] DOB: 1946/04/06   Interim History: Danny Walker is a 73 y.o. Spanish-speaking male with history of hypertension, prostatitis, liver mass, and urinary retention returning to the clinic for follow-up of seropositive myasthenia gravis.  The patient was accompanied to the clinic by self.  Spanish interpretor was used for this visit.  History of present illness: Patient was hospitalized at [facility] from 11/11 - 04/12/2018 with 32-month history of progressive difficulty swallowing, facial weakness, vertical double vision, and 30lb weight loss.  Due to high clinical suspicion for myasthenia gravis, he was started on mestinon 60mg  TID with marked improvement.  He necessitated brief placement of NG tube for feeding and medications, which after 2 days was removed.  Testing included MRI brain showing chronic microischemic changes, serology testing which returned positive for AChR antibodies, and CT chest which did not show evidence of thymoma.  He was started on prednisone 20mg  and titrated to 60mg /d.  He has been on prednisone 60mg  daily and mestinon 60mg  four times daily (4am, 10am, 4pm, 10am) since November 2019.  Currently, he denies any problems with ptosis, diplopia, dysphagia, dysarthria, or limb weakness. He is back to eating normal textured foods and regained his weight.  He complains of feeling that his eyes are swollen. No blurred vision, itching, or double vision.   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  His wife passed away in 08/26/16.   UPDATE 03/16/19:  He has been able to reduce prednisone down to 10mg  daily and is tolerating it well.  He continues to have leg pain and cramps at night time.   He is taking mestinon 60mg  at 3pm and 10pm, but reports usually going to bed around 10p-midnight.   UPDATE 04/22/2019:  He is here for  follow-up visit.  He remains on prednisone 10mg  daily and was able to reduce mestinon to 30mg  at 7am and 3pm.  Lowering his Mestinon has resolved his muscle cramps.  He denies any double vision, eye droopiness, arm or leg weakness, difficulty swallowing/talking.  Medications:  Current Outpatient Medications on File Prior to Visit  Medication Sig Dispense Refill  . calcium-vitamin D (OSCAL WITH D) 500-200 MG-UNIT TABS tablet TOME UNA TABLETA DOS VECES AL DIA 180 tablet 1  . fesoterodine (TOVIAZ) 4 MG TB24 tablet Take 4 mg by mouth daily.    . finasteride (PROSCAR) 5 MG tablet Take 1 tablet (5 mg total) by mouth every morning. 90 tablet 1  . lisinopril (ZESTRIL) 40 MG tablet TOME UNA TABLETA TODOS LOS DIAS (Patient taking differently: 20 mg. ) 30 tablet 5  . ofloxacin (OCUFLOX) 0.3 % ophthalmic solution Instill one drop OD QID x 1 week    . omeprazole (PRILOSEC) 40 MG capsule Take 1 capsule (40 mg total) by mouth every morning. 90 capsule 3  . polyvinyl alcohol (LIQUIFILM TEARS) 1.4 % ophthalmic solution Place 1 drop into both eyes as needed for dry eyes. 15 mL 0  . predniSONE (DELTASONE) 10 MG tablet TOME DOS TABLETAS POR VIA ORAL TODOS LOS DIAS 60 tablet 5  . pyridostigmine (MESTINON) 60 MG tablet Take 1/2 tablet at 10am and 4pm 270 tablet 3  . famotidine (PEPCID) 20 MG tablet TOME UNA TABLETA DOS VECES AL DIA 180 tablet 1  . Vitamin D, Ergocalciferol, (DRISDOL) 50000 units CAPS capsule Take 50,000 Units  by mouth every Monday.  2   No current facility-administered medications on file prior to visit.     Allergies: No Known Allergies  Review of Systems:  CONSTITUTIONAL: No fevers, chills, night sweats, or weight loss.  EYES: No visual changes or eye pain ENT: No hearing changes.  No history of nose bleeds.   RESPIRATORY: No cough, wheezing and shortness of breath.   CARDIOVASCULAR: Negative for chest pain, and palpitations.   GI: Negative for abdominal discomfort, blood in stools or black  stools.  No recent change in bowel habits.   GU:  No history of incontinence.   MUSCLOSKELETAL: No history of joint pain or swelling.  No myalgias.   SKIN: Negative for lesions, rash, and itching.   ENDOCRINE: Negative for cold or heat intolerance, polydipsia or goiter.   PSYCH:  No depression or anxiety symptoms.   NEURO: As Above.   Vital Signs:  BP (!) 144/82   Pulse 77   Ht 5\' 6"  (1.676 m)   Wt 169 lb (76.7 kg)   SpO2 98%   BMI 27.28 kg/m   Neurological Exam: MENTAL STATUS including orientation to time, place, person, recent and remote memory, attention span and concentration, language, and fund of knowledge is normal.  Speech is not dysarthric.  CRANIAL NERVES:  Normal conjugate, extra-ocular eye movements in all directions of gaze.  No ptosis at rest or with sustained upgaze.  Face is symmetric, facial muscles are 5/5 including orbicularis oculi, oris, and buccinator.   MOTOR:  Motor strength is 5/5 in all extremities, no fatigability.  COORDINATION/GAIT:  Easily able to stand up without using arms to push off.   Gait narrow based and stable.   Data: MRI brain wo contrast 04/03/2018: 1. Discontinued examination due to patient's cessation of choking. 2. No acute intracranial abnormality. Chronic small vessel ischemia.  CT chest w contrast 04/07/2018: No evidence of anterior mediastinal mass to suggest thymoma. No acute cardiopulmonary disease.  Labs 04/04/2018:  AChR binding 7.86*, blocking 65*, modulating 57*, TSH 1.77, CK 75, HIV neg, RPR neg   IMPRESSION/PLAN: 1.  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  2.  Longterm steroid side effects              - Continue omeprazole 40mg  daily              - Continue calcium 1200 mg/day and vitamin D intake of 800  international units/day  Return to clinic in 2 months  Greater than 50% of this 15 minute visit was spent in counseling, explanation of diagnosis, planning of further management, and coordination of care.   Thank you for allowing me to participate in patient's care.  If I can answer any additional questions, I would be pleased to do so.    Sincerely,    Donika K. Posey Pronto, DO

## 2019-06-05 ENCOUNTER — Other Ambulatory Visit: Payer: Self-pay | Admitting: Neurology

## 2019-06-05 ENCOUNTER — Other Ambulatory Visit: Payer: Self-pay | Admitting: Internal Medicine

## 2019-06-05 DIAGNOSIS — I10 Essential (primary) hypertension: Secondary | ICD-10-CM

## 2019-06-24 ENCOUNTER — Ambulatory Visit: Payer: Medicare Other | Admitting: Neurology

## 2019-06-26 ENCOUNTER — Other Ambulatory Visit: Payer: Self-pay

## 2019-06-26 ENCOUNTER — Ambulatory Visit (INDEPENDENT_AMBULATORY_CARE_PROVIDER_SITE_OTHER): Payer: Medicare Other | Admitting: Neurology

## 2019-06-26 ENCOUNTER — Encounter: Payer: Self-pay | Admitting: Neurology

## 2019-06-26 VITALS — BP 157/89 | HR 96 | Ht 66.0 in | Wt 175.0 lb

## 2019-06-26 DIAGNOSIS — Z7952 Long term (current) use of systemic steroids: Secondary | ICD-10-CM

## 2019-06-26 DIAGNOSIS — R0600 Dyspnea, unspecified: Secondary | ICD-10-CM

## 2019-06-26 DIAGNOSIS — R208 Other disturbances of skin sensation: Secondary | ICD-10-CM

## 2019-06-26 DIAGNOSIS — G7000 Myasthenia gravis without (acute) exacerbation: Secondary | ICD-10-CM

## 2019-06-26 MED ORDER — PYRIDOSTIGMINE BROMIDE 60 MG PO TABS: ORAL_TABLET | ORAL | 3 refills | Status: DC

## 2019-06-26 MED ORDER — PREDNISONE 10 MG PO TABS: 10.0000 mg | ORAL_TABLET | Freq: Every day | ORAL | 1 refills | Status: DC

## 2019-06-26 NOTE — Patient Instructions (Signed)
Contine con los [employer] est tomando

## 2019-06-26 NOTE — Progress Notes (Signed)
Follow-up Visit   Date: 07-03-2019   Reg Uhland MRN: [id number] DOB: Mar 04, 1946   Interim History: [place] Walker is a 74 y.o. Spanish-speaking male with history of hypertension, prostatitis, liver mass, and urinary retention returning to the clinic for follow-up of seropositive myasthenia gravis.  The patient was accompanied to the clinic by self.  Spanish interpretor was used for this visit.  History of present illness: Patient was hospitalized at [facility] from 11/11 - 04/12/2018 with 3-month history of progressive difficulty swallowing, facial weakness, vertical double vision, and 30lb weight loss.  Due to high clinical suspicion for myasthenia gravis, he was started on mestinon 60mg  TID with marked improvement.  He necessitated brief placement of NG tube for feeding and medications.  MRI brain showed chronic microischemic changes.  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  He was started on prednisone 20mg  and titrated to 60mg /d.  He was on prednisone 60mg  daily and mestinon 60mg  four times daily (4am, 10am, 4pm, 10am) since November 2019, which has successfully been tapered.  Fortunately, he has not had any relapse while coming down on prednisone and has been on 10mg  since August 2020.  Due to leg cramps, his mestinon was reduced to 30mg  BID which has helped.   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  His wife passed away in 2016-08-21.   UPDATE 2019/07/03:  He is here for follow-up visit.  He remains on prednisone 10mg  daily and mestinon 30mg  at 7am and 3pm and is doing great.  No new facial weakness, double vision, ptosis, or limb weakness.  Over the past few days, he has noticed new cold sensation of the left hand before he goes to sleep, it is self-limited. There is no weakness, numbness, or tingling.  He also reports intermittent  shortness of breath, usually when working, and he tends to get agitated when this occurs.  No chest pain. He has not seen his PCP for these symptoms.   Medications:  Current Outpatient Medications on File Prior to Visit  Medication Sig Dispense Refill  . calcium-vitamin D (OSCAL WITH D) 500-200 MG-UNIT TABS tablet TOME UNA TABLETA DOS VECES AL DIA 180 tablet 1  . famotidine (PEPCID) 20 MG tablet TOME UNA TABLETA DOS VECES AL DIA 180 tablet 1  . fesoterodine (TOVIAZ) 4 MG TB24 tablet Take 4 mg by mouth daily.    . finasteride (PROSCAR) 5 MG tablet Take 1 tablet (5 mg total) by mouth every morning. 90 tablet 1  . lisinopril (ZESTRIL) 40 MG tablet TOME UNA TABLETA TODOS LOS DIAS 90 tablet 1  . ofloxacin (OCUFLOX) 0.3 % ophthalmic solution Instill one drop OD QID x 1 week    . omeprazole (PRILOSEC) 40 MG capsule Take 1 capsule (40 mg total) by mouth every morning. 90 capsule 3  . polyvinyl alcohol (LIQUIFILM TEARS) 1.4 % ophthalmic solution Place 1 drop into both eyes as needed for dry eyes. 15 mL 0  . predniSONE (DELTASONE) 10 MG tablet TOME DOS TABLETAS POR VIA ORAL TODOS LOS DIAS 60 tablet 5  . pyridostigmine (MESTINON) 60 MG tablet TOME UNA TABLETA A LAS 10AM, 4PM, 10PM 270 tablet 3  . Vitamin D, Ergocalciferol, (DRISDOL) 50000 units CAPS capsule Take 50,000 Units by mouth every 2022-08-22.  2   No current facility-administered medications on file prior to  visit.    Allergies: No Known Allergies  Vital Signs:  BP (!) 157/89   Pulse 96   Ht 5\' 6"  (1.676 m)   Wt 175 lb (79.4 kg)   SpO2 99%   BMI 28.25 kg/m   Neurological Exam: MENTAL STATUS including orientation to time, place, person, recent and remote memory, attention span and concentration, language, and fund of knowledge is normal.  Speech is not dysarthric.  CRANIAL NERVES:  Normal conjugate, extra-ocular eye movements in all directions of gaze.  No ptosis at rest or with sustained upgaze.  Face is symmetric, facial muscles are 5/5  including orbicularis oculi, oris, and buccinator.   MOTOR:  Motor strength is 5/5 in all extremities, no fatigability.  REFLEXES:  2+/4 in the upper extremities.    SENSATION:  Intact to temperature and vibration bilaterally. Both hands are warm   COORDINATION/GAIT:  Easily able to stand up without using arms to push off.   Gait narrow based and stable.   Data: MRI brain wo contrast 04/03/2018: 1. Discontinued examination due to patient's cessation of choking. 2. No acute intracranial abnormality. Chronic small vessel ischemia.  CT chest w contrast 04/07/2018: No evidence of anterior mediastinal mass to suggest thymoma. No acute cardiopulmonary disease.  Labs 04/04/2018:  AChR binding 7.86*, blocking 65*, modulating 57*, TSH 1.77, CK 75, HIV neg, RPR neg   IMPRESSION/PLAN: 1.  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  Maximal dose of prednisone was 60mg  at initial presentation and he has been slowly tapered down to 10mg , which he is tolerating well.  He has been on prednisone 10mg  since August 2020.    - I will keep him on prednisone 10mg  and consider tapering again during the fall, if he does well over the summer.   - Continue mestinon 30mg  at 7am and 3pm  -  Lowering the dose alleviated leg cramps  - If he develops breakthrough weakness during the summer, increase mestinon to 60mg  TID (monitor for worsening cramps) and prednisone to 20mg , or higher as needed  2.  Longterm steroid side effects              - Continue omeprazole 40mg  daily              - Continue calcium 1200 mg/day and vitamin D intake of 800 international units/day  3.  Left hand cold sensation.  I do not appreciate any vascular or neurological abnormalities on his exam.    4.  Intermittent dyspnea, recommend that he follow-up with his PCP to further evaluate  Return to clinic in 6 months  Total time spent:  30 min   Thank  you for allowing me to participate in patient's care.  If I can answer any additional questions, I would be pleased to do so.    Sincerely,    Davonte Siebenaler K. Posey Pronto, DO

## 2019-07-10 ENCOUNTER — Encounter: Payer: Self-pay | Admitting: *Deleted

## 2019-07-10 NOTE — Progress Notes (Signed)
Spoke with Dr Sherry Ruffing 07-08-2019 in reference to [place]'s IFOBT received by mail on 07-05-19. Patient collected specimen 02-06-2019 Specimen possibly delayed by the [employer] as they have been overwhelmed with deliveries. Sample is too old for analysis.  Patient was also given a IFOBT kit on 04-15-2019  We have not received that kit to date.  Sherry Ruffing stated she would address the IFOBT at the patient's next visit.  Patient needs an Astronomer.   [place], PBT Clinic Lab 07-10-2019

## 2019-07-13 ENCOUNTER — Ambulatory Visit: Payer: Medicare Other | Attending: Internal Medicine

## 2019-07-13 DIAGNOSIS — Z23 Encounter for immunization: Secondary | ICD-10-CM

## 2019-07-13 NOTE — Progress Notes (Signed)
   Covid-19 Vaccination Clinic  Name:  Steward Cost    MRN: [id number] DOB: 03/17/1946  07/13/2019  Mr. Moscoso was observed post Covid-19 immunization for 15 minutes without incidence. He was provided with Vaccine Information Sheet and instruction to access the V-Safe system.   Mr. Champion was instructed to call 911 with any severe reactions post vaccine: Marland Kitchen Difficulty breathing  . Swelling of your face and throat  . A fast heartbeat  . A bad rash all over your body  . Dizziness and weakness    Immunizations Administered    Name Date Dose VIS Date Route   Pfizer COVID-19 Vaccine 07/13/2019  9:26 AM 0.3 mL 05/03/2019 Intramuscular   Manufacturer: [place]   Lot: [id number]   [place]: [id number]

## 2019-08-05 ENCOUNTER — Other Ambulatory Visit: Payer: Self-pay | Admitting: Neurology

## 2019-08-06 ENCOUNTER — Ambulatory Visit: Payer: Medicare Other | Attending: Internal Medicine

## 2019-08-06 DIAGNOSIS — Z23 Encounter for immunization: Secondary | ICD-10-CM

## 2019-08-06 NOTE — Progress Notes (Signed)
   Covid-19 Vaccination Clinic  Name:  Danny [place]    MRN: [id number] DOB: Apr 30, 1946  08/06/2019  D[place] was observed post Covid-19 immunization for 15 minutes without incident. He was provided with Vaccine Information Sheet and instruction to access the V-Safe system.   [place] was instructed to call 911 with any severe reactions post vaccine: Marland Kitchen Difficulty breathing  . Swelling of face and throat  . A fast heartbeat  . A bad rash all over body  . Dizziness and weakness   Immunizations Administered    Name Date Dose VIS Date Route   Pfizer COVID-19 Vaccine 08/06/2019  9:44 AM 0.3 mL 05/03/2019 Intramuscular   Manufacturer: [place]   Lot: [id number]   [place]: [id number]

## 2019-08-19 ENCOUNTER — Ambulatory Visit (INDEPENDENT_AMBULATORY_CARE_PROVIDER_SITE_OTHER): Payer: Medicare Other | Admitting: Internal Medicine

## 2019-08-19 ENCOUNTER — Encounter: Payer: Self-pay | Admitting: Internal Medicine

## 2019-08-19 ENCOUNTER — Ambulatory Visit (HOSPITAL_COMMUNITY)
Admission: RE | Admit: 2019-08-19 | Discharge: 2019-08-19 | Disposition: A | Discharge status: Home / Self Care | Payer: Medicare Other | Source: Ambulatory Visit | Attending: Internal Medicine | Admitting: Internal Medicine

## 2019-08-19 ENCOUNTER — Other Ambulatory Visit: Payer: Self-pay

## 2019-08-19 VITALS — BP 144/85 | HR 66 | Temp 98.3°F | Ht 66.0 in | Wt 175.0 lb

## 2019-08-19 DIAGNOSIS — Z0000 Encounter for general adult medical examination without abnormal findings: Secondary | ICD-10-CM | POA: Diagnosis not present

## 2019-08-19 DIAGNOSIS — I10 Essential (primary) hypertension: Secondary | ICD-10-CM | POA: Diagnosis present

## 2019-08-19 DIAGNOSIS — M25511 Pain in right shoulder: Secondary | ICD-10-CM | POA: Insufficient documentation

## 2019-08-19 DIAGNOSIS — G7000 Myasthenia gravis without (acute) exacerbation: Secondary | ICD-10-CM

## 2019-08-19 MED ORDER — NAPROXEN 250 MG PO TABS: 250.0000 mg | ORAL_TABLET | Freq: Two times a day (BID) | ORAL | 0 refills | Status: DC

## 2019-08-19 NOTE — Progress Notes (Signed)
CC: Hypertension, myasthenia gravis,  and right shoulder pain  HPI:  [name] is a 74 y.o.  with a PMH listed below presenting for hypertension, myasthenia gravis, and right shoulder pain.   Please see A&P for status of the patient's chronic medical conditions  Past Medical History:  Diagnosis Date  . BPH (benign prostatic hyperplasia)   . DDD (degenerative disc disease), lumbosacral   . Fatty liver   . Foley catheter in place   . History of gallstones   . History of prostatitis   . Hypertension   . IDA (iron deficiency anemia)   . Liver mass 09/17/2001   4.7cm , noted on [place] abd  . Low back pain   . Nocturia   . 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 Spondylosis Lumbar  . Umbilical hernia   . Urinary retention 02/08/2018   Review of Systems: Refer to history of present illness and assessment and plans for pertinent review of systems, all others reviewed and negative.  Physical Exam:  Vitals:   08/19/19 1458  BP: (!) 166/90  Pulse: 80  Temp: 98.3 F (36.8 C)  TempSrc: Oral  SpO2: 99%  Weight: 175 lb (79.4 kg)  Height: 5\' 6"  (1.676 m)   Physical Exam  Constitutional: He is oriented to person, place, and time and well-developed, well-nourished, and in no distress.  HENT:  Head: Normocephalic and atraumatic.  Cardiovascular: Normal rate, regular rhythm and normal heart sounds.  Pulmonary/Chest: Breath sounds normal. No respiratory distress.  Abdominal: Soft. Bowel sounds are normal. He exhibits no distension.  Musculoskeletal:     Comments: Right shoulder: No erythema, edema, skin changes, or warmth. Pain with active and passive abduction and extension of arm. Unable to lift arm to 90 degree angle both in abduction or extension. Positive Neers and beer can test.   Neurological: He is alert and oriented to person, place, and time.  Skin: Skin is warm and dry. No erythema.  Psychiatric: Mood and affect normal.    Social  History   Socioeconomic History  . Marital status: Married    Spouse name: Not on file  . Number of children: 1  . Years of education: 19  . Highest education level: High school graduate  Occupational History  . Occupation: Airline pilot: [employer]  Tobacco Use  . Smoking status: Never Smoker  . Smokeless tobacco: Never Used  Substance and Sexual Activity  . Alcohol use: No  . Drug use: Never  . Sexual activity: Yes  Other Topics Concern  . Not on file  Social History Narrative   Lives with daughter in a one story home.  Education: high school.  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     Right handed   Social Determinants of Health   Financial Resource Strain:   . Difficulty of Paying Living Expenses:   Food Insecurity:   . Worried About Charity fundraiser in the Last Year:   . Arboriculturist in the Last Year:   Transportation Needs:   . Film/video editor (Medical):   Marland Kitchen Lack of Transportation (Non-Medical):   Physical Activity:   . Days of Exercise per Week:   . Minutes of Exercise per Session:   Stress:   . Feeling of Stress :   Social Connections:   . Frequency of Communication with Friends and Family:   .  Frequency of Social Gatherings with Friends and Family:   . Attends Religious Services:   . Active Member of Clubs or Organizations:   . Attends Archivist Meetings:   Marland Kitchen Marital Status:   Intimate Partner Violence:   . Fear of Current or Ex-Partner:   . Emotionally Abused:   Marland Kitchen Physically Abused:   . Sexually Abused:    Family History  Problem Relation Age of Onset  . Hypertension Mother   . Hypertension Father     Assessment & Plan:   See Encounters Tab for problem based charting.  Patient discussed with Dr. Philipp Ovens

## 2019-08-19 NOTE — Patient Instructions (Addendum)
[name],  It was a pleasure to see you today. Thank you for coming in.   Today we discussed your right shoulder pain, this could be an issue with your joint. I have ordered some imaging to evaluate this and iwll contact you with the results. You can start taking Naproxen for the pain.   We also discussed your blood pressure. It was a little high today, continue taking your current medications. Limit your sodium intake.   Please return to clinic in 1 month to check on your shoulder or sooner if needed.   Thank you again for coming in.   Danny Walker M.D.  Danny Walker,  Fue un placer verte hoy. Gracias por venir.  Hoy hablamos sobre su dolor en el hombro derecho, esto podra ser un problema con su articulacin. He ordenado algunas imgenes para evaluar esto y me pondr en contacto con usted con los [place]. Puede comenzar a tomar naproxeno para Conservation officer, historic buildings.  Tambin hablamos de su presin arterial. Hoy fue un poco alto, contine tomando sus medicamentos actuales. Limite su consumo de sodio.  Regrese a Copy en 1 mes para revisar su hombro o antes si es necesario.  Gracias de nuevo por venir.  Danny Walker.D.

## 2019-08-20 ENCOUNTER — Other Ambulatory Visit: Payer: Self-pay

## 2019-08-20 DIAGNOSIS — M25511 Pain in right shoulder: Secondary | ICD-10-CM

## 2019-08-20 HISTORY — DX: Pain in right shoulder: M25.511

## 2019-08-20 NOTE — Assessment & Plan Note (Signed)
Patient reports having right shoulder pain and weakness for about 1 month that has been worsening. He reports that it started at work when he was carrying something around and it has been continuing to worsen. Reports mild neck pain however it's mostly around the shoulder joint. Located on posterior aspect of his shoulder with some radiation down to his elbow. Reports it's worse with activity. He has not tried anything for the pain. He denies any trauma to the area, never had this before. Works as at [employer] and does a lot of lifting. He reports difficulty with driving due to the weakness.   On exam he has no erythema, warmth, or swelling noted. Has limited motion 2/2 pain, unable extend or abduct above 90 degrees. He has pain with active and passive extension and abduction, and a positive Neers and beer can test. Symptoms and onset concerning for intraarticular joint disease such as adhesive capulitis vs OA. Will obtain imaging to further evaluate.    -Right shoulder x-ray -Short course of naproxen  -RTC in 1 month to follow up  Addendum: Right shoulder x-ray showed degenerative changes of right AC joint, joint space narrowing and spur formation, and osseous demineralization. 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 Attempted to contact patient however no answer x3 attempts, left message to call back. Would likely benefit from steroid injection and obtaining a DEXA scan. Offer appointment if patient calls back.

## 2019-08-20 NOTE — Assessment & Plan Note (Signed)
Patient is currently on prednisone and pyridostigmine. Follows with neurology and last saw them 03/2019. He denies any vision issues, swallowing issues, trouble with speech, or issues with chewing. Continue following with neurology.

## 2019-08-20 NOTE — Telephone Encounter (Signed)
Received following fax message from pharmacy:  "Patient request new RX for vitamin D.  We don not have this on file."  Will forward to PCP. SChaplin, RN,BSN

## 2019-08-20 NOTE — Progress Notes (Signed)
Attempted to contact patient x3, no answer. Left VM to call back. If he does please inform him that his x-ray showed some degenerative changes concerning for osteoarthritis. He can schedule a follow up appointment for a steroid injection.

## 2019-08-20 NOTE — Assessment & Plan Note (Signed)
Patient is on lisinopril 40 mg daily. BP today is 166/90, repeat BP 144/85. He denies any issues taking his medications. He reports that his blood pressure is in the normal range, around 120/80, when he checks it at home, he states it's always elevated when he is at the doctors. Last BMP on 11/20 was unremarkable. Will hold off on adjusting his medications at this time.  -Continue lisinopril 40 mg daily

## 2019-08-21 ENCOUNTER — Telehealth: Payer: Self-pay

## 2019-08-21 LAB — FECAL OCCULT BLOOD, IMMUNOCHEMICAL: Fecal Occult Bld: NEGATIVE

## 2019-08-21 MED ORDER — VITAMIN D (ERGOCALCIFEROL) 1.25 MG (50000 UNIT) PO CAPS: 50000.0000 [IU] | ORAL_CAPSULE | ORAL | 2 refills | Status: DC

## 2019-08-21 NOTE — Progress Notes (Signed)
Internal Medicine Clinic Attending  Case discussed with Dr. Krienke at the time of the visit.  We reviewed the resident's history and exam and pertinent patient test results.  I agree with the assessment, diagnosis, and plan of care documented in the resident's note.    

## 2019-08-21 NOTE — Telephone Encounter (Signed)
Requesting x-ray results, please call back.

## 2019-08-22 NOTE — Telephone Encounter (Signed)
Contacted patient regarding x-ray results, got in contact with daughter who confirmed name and DOB. Advised her of results and that patient will need to schedule a follow up for ultrasound and steroid injection. Will send message to front desk to schedule appointment when an attending who does injections is in clinic.

## 2019-09-06 NOTE — Progress Notes (Signed)
Called patient and no answer.  Did you send this letter to the patient already?

## 2019-09-16 ENCOUNTER — Other Ambulatory Visit: Payer: Self-pay

## 2019-09-16 ENCOUNTER — Ambulatory Visit (INDEPENDENT_AMBULATORY_CARE_PROVIDER_SITE_OTHER): Payer: Medicare Other | Admitting: Internal Medicine

## 2019-09-16 DIAGNOSIS — M25511 Pain in right shoulder: Secondary | ICD-10-CM | POA: Diagnosis present

## 2019-09-16 MED ORDER — NAPROXEN 250 MG PO TABS: 250.0000 mg | ORAL_TABLET | Freq: Two times a day (BID) | ORAL | 0 refills | Status: DC

## 2019-09-16 MED ORDER — DICLOFENAC SODIUM 1 % EX GEL: 4.0000 g | Freq: Four times a day (QID) | CUTANEOUS | 1 refills | Status: DC

## 2019-09-16 NOTE — Patient Instructions (Addendum)
[name], It was great seeing you. Glad your shoulder is feeling better. I am refilling the Naproxen that helped the pain.   If possible, try taking it as needed as opposed to every day. I am also sending in a topical gel that should also help with pain that you can use in addition to the oral medication.   Take care! Dr. Leandrew Koyanagi, Fue genial verte. Me alegro de que su hombro se sienta mejor. Estoy volviendo a llenar el naproxeno que ayud al [employer].  Si es posible, intente tomarlo segn sea Statistician de [employer]. Tambin estoy enviando un gel tpico que tambin debera ayudar con el dolor que puede usar adems de la medicacin oral.  Cudate! Dr. Koleen Distance

## 2019-09-18 ENCOUNTER — Other Ambulatory Visit: Payer: Self-pay | Admitting: Neurology

## 2019-09-18 ENCOUNTER — Encounter: Payer: Self-pay | Admitting: Internal Medicine

## 2019-09-18 DIAGNOSIS — K219 Gastro-esophageal reflux disease without esophagitis: Secondary | ICD-10-CM

## 2019-09-18 NOTE — Assessment & Plan Note (Signed)
Patient presents for one month follow-up of right shoulder pain. He was prescribed course of naproxen at last visit. X-ray showed degenerative changes consistent with OA. Today his pain and ROM are much better. He would like to continue the naproxen. Given his GERD and age, instructed him to decrease his use to as needed as opposed to daily. Will also supplement with topical voltaren gel. Discussed option of steroid injection if pain or decreased ROM return.

## 2019-09-18 NOTE — Progress Notes (Signed)
Acute Office Visit  Subjective:    Patient ID: Danny Walker, male    DOB: [birth year], 74 y.o.   MRN: [id number]  Chief Complaint  Patient presents with  . Follow-up    HPI Patient is in today for follow-up on right shoulder pain. Please see problem based charting for further details.   Past Medical History:  Diagnosis Date  . BPH (benign prostatic hyperplasia)   . DDD (degenerative disc disease), lumbosacral   . Fatty liver   . Foley catheter in place   . History of gallstones   . History of prostatitis   . Hypertension   . IDA (iron deficiency anemia)   . Liver mass 09/17/2001   4.7cm , noted on [place] abd  . Low back pain   . Nocturia   . 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 Spondylosis Lumbar  . Umbilical hernia   . Urinary retention 02/08/2018    Past Surgical History:  Procedure Laterality Date  . ERCP W/ SPHICTEROTOMY  09/17/2001  . INSERTION OF MESH N/A 02/05/2016   Procedure: INSERTION OF MESH, UMBILICAL HERNIA;  Surgeon: Armandina Gemma, MD;  Location: [facility];  Service: General;  Laterality: N/A;  . LAPAROSCOPIC CHOLECYSTECTOMY  09/22/2001  . TRANSURETHRAL RESECTION OF PROSTATE N/A 03/05/2018   Procedure: TRANSURETHRAL RESECTION OF THE PROSTATE (TURP);  Surgeon: Cleon Gustin, MD;  Location[facility];  Service: Urology;  Laterality: N/A;  . UMBILICAL HERNIA REPAIR N/A 02/05/2016   Procedure: UMBILICAL HERNIA REPAIR WITH MESH PATCH;  Surgeon: Armandina Gemma, MD;  Location: [facility];  Service: General;  Laterality: N/A;    Family History  Problem Relation Age of Onset  . Hypertension Mother   . Hypertension Father     Social History   Socioeconomic History  . Marital status: Widowed    Spouse name: Not on file  . Number of children: 1  . Years of education: 37  . Highest education level: High school graduate  Occupational History  . Occupation: Airline pilot: [employer]  Tobacco Use  . Smoking status: Never Smoker  . Smokeless tobacco: Never Used  Substance and Sexual Activity  . Alcohol use: No  . Drug use: Never  . Sexual activity: Yes  Other Topics Concern  . Not on file  Social History Narrative   Lives with daughter in a one story home.  Education: high school.  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     Right handed   Social Determinants of Health   Financial Resource Strain:   . Difficulty of Paying Living Expenses:   Food Insecurity:   . Worried About Charity fundraiser in the Last Year:   . Arboriculturist in the Last Year:   Transportation Needs:   . Film/video editor (Medical):   Marland Kitchen Lack of Transportation (Non-Medical):   Physical Activity:   . Days of Exercise per Week:   . Minutes of Exercise per Session:   Stress:   . Feeling of Stress :   Social Connections:   . Frequency of Communication with Friends and Family:   . Frequency of Social Gatherings with Friends and Family:   . Attends Religious Services:   . Active Member of Clubs or Organizations:   . Attends Archivist Meetings:   Marland Kitchen Marital Status:   Intimate  Partner Violence:   . Fear of Current or Ex-Partner:   . Emotionally Abused:   Marland Kitchen Physically Abused:   . Sexually Abused:     Outpatient Medications Prior to Visit  Medication Sig Dispense Refill  . calcium-vitamin D (OSCAL WITH D) 500-200 MG-UNIT TABS tablet TOME UNA TABLETA DOS VECES AL DIA 180 tablet 1  . famotidine (PEPCID) 20 MG tablet TOME UNA TABLETA DOS VECES AL DIA 180 tablet 1  . fesoterodine (TOVIAZ) 4 MG TB24 tablet Take 4 mg by mouth daily.    . finasteride (PROSCAR) 5 MG tablet Take 1 tablet (5 mg total) by mouth every morning. 90 tablet 1  . lisinopril (ZESTRIL) 40 MG tablet TOME UNA TABLETA TODOS LOS DIAS 90 tablet 1  . ofloxacin (OCUFLOX) 0.3 % ophthalmic solution Instill one drop OD QID x 1 week    . polyvinyl alcohol (LIQUIFILM TEARS) 1.4 %  ophthalmic solution Place 1 drop into both eyes as needed for dry eyes. 15 mL 0  . predniSONE (DELTASONE) 10 MG tablet TOME DOS TABLETAS POR VIA ORAL TODOS LOS DIAS 60 tablet 5  . pyridostigmine (MESTINON) 60 MG tablet Half tablet at 7am and 3pm. 270 tablet 3  . Vitamin D, Ergocalciferol, (DRISDOL) 1.25 MG (50000 UNIT) CAPS capsule Take 1 capsule (50,000 Units total) by mouth every Monday. 5 capsule 2  . naproxen (NAPROSYN) 250 MG tablet Take 1 tablet (250 mg total) by mouth 2 (two) times daily with a meal. 28 tablet 0  . omeprazole (PRILOSEC) 40 MG capsule Take 1 capsule (40 mg total) by mouth every morning. 90 capsule 3   No facility-administered medications prior to visit.    No Known Allergies  Review of Systems  Constitutional: Negative for activity change, chills and fever.  Musculoskeletal: Negative for arthralgias, joint swelling and neck stiffness.  Skin: Negative for rash.  Neurological: Negative for weakness and numbness.       Objective:    Physical Exam Constitutional:      General: He is not in acute distress.    Appearance: Normal appearance.  Musculoskeletal:     Right shoulder: No deformity, effusion or tenderness. Normal range of motion. Normal strength.  Skin:    General: Skin is warm and dry.  Neurological:     General: No focal deficit present.     Mental Status: He is alert and oriented to person, place, and time.     Sensory: No sensory deficit.     Motor: No weakness.     BP 125/65 (BP Location: Left Arm, Patient Position: Sitting, Cuff Size: Normal)   Pulse 86   Temp 98.3 F (36.8 C)   Ht 5\' 6"  (1.676 m)   Wt 170 lb 9.6 oz (77.4 kg)   SpO2 98% Comment: room air  BMI 27.54 kg/m  Wt Readings from Last 3 Encounters:  09/16/19 170 lb 9.6 oz (77.4 kg)  08/19/19 175 lb (79.4 kg)  06/26/19 175 lb (79.4 kg)    Health Maintenance Due  Topic Date Due  . Hepatitis C Screening  Never done  . TETANUS/TDAP  Never done  . COLONOSCOPY  Never done  .  PNA vac Low Risk Adult (1 of 2 - PCV13) Never done    There are no preventive care reminders to display for this patient.   Lab Results  Component Value Date   TSH 1.777 04/04/2018   Lab Results  Component Value Date   WBC 6.6 04/05/2018   HGB 10.9 (L)  04/05/2018   HCT 34.0 (L) 04/05/2018   MCV 95.0 04/05/2018   PLT 268 04/05/2018   Lab Results  Component Value Date   NA 142 04/15/2019   K 3.7 04/15/2019   CO2 21 04/15/2019   GLUCOSE 81 04/15/2019   BUN 22 04/15/2019   CREATININE 0.94 04/15/2019   BILITOT 0.7 03/31/2018   ALKPHOS 62 03/31/2018   AST 18 03/31/2018   ALT 17 03/31/2018   PROT 6.6 03/31/2018   ALBUMIN 3.0 (L) 04/11/2018   CALCIUM 9.1 04/15/2019   ANIONGAP 8 04/11/2018   No results found for: CHOL No results found for: HDL No results found for: LDLCALC No results found for: TRIG No results found for: CHOLHDL Lab Results  Component Value Date   HGBA1C 6.5 06/25/2018       Assessment & Plan:   Problem List Items Addressed This Visit      Other   Right shoulder pain    Patient presents for one month follow-up of right shoulder pain. He was prescribed course of naproxen at last visit. X-ray showed degenerative changes consistent with OA. Today his pain and ROM are much better. He would like to continue the naproxen. Given his GERD and age, instructed him to decrease his use to as needed as opposed to daily. Will also supplement with topical voltaren gel. Discussed option of steroid injection if pain or decreased ROM return.           Meds ordered this encounter  Medications  . naproxen (NAPROSYN) 250 MG tablet    Sig: Take 1 tablet (250 mg total) by mouth 2 (two) times daily with a meal.    Dispense:  28 tablet    Refill:  0  . diclofenac Sodium (VOLTAREN) 1 % GEL    Sig: Apply 4 g topically 4 (four) times daily.    Dispense:  150 g    Refill:  1     Isamu Trammel D Greycen Felter, DO

## 2019-09-20 NOTE — Progress Notes (Signed)
Internal Medicine Clinic Attending  Case discussed with Dr. Bloomfield at the time of the visit.  We reviewed the resident's history and exam and pertinent patient test results.  I agree with the assessment, diagnosis, and plan of care documented in the resident's note.  

## 2019-09-30 ENCOUNTER — Ambulatory Visit (HOSPITAL_COMMUNITY)
Admission: RE | Admit: 2019-09-30 | Discharge: 2019-09-30 | Disposition: A | Discharge status: Home / Self Care | Payer: Medicare Other | Source: Ambulatory Visit | Attending: Internal Medicine | Admitting: Internal Medicine

## 2019-09-30 ENCOUNTER — Encounter: Payer: Self-pay | Admitting: Internal Medicine

## 2019-09-30 ENCOUNTER — Other Ambulatory Visit: Payer: Self-pay

## 2019-09-30 ENCOUNTER — Telehealth: Payer: Self-pay | Admitting: Internal Medicine

## 2019-09-30 ENCOUNTER — Ambulatory Visit (INDEPENDENT_AMBULATORY_CARE_PROVIDER_SITE_OTHER): Payer: Medicare Other | Admitting: Internal Medicine

## 2019-09-30 VITALS — BP 144/73 | HR 63 | Temp 98.1°F | Wt 167.6 lb

## 2019-09-30 DIAGNOSIS — G7000 Myasthenia gravis without (acute) exacerbation: Secondary | ICD-10-CM

## 2019-09-30 DIAGNOSIS — M25559 Pain in unspecified hip: Secondary | ICD-10-CM | POA: Insufficient documentation

## 2019-09-30 DIAGNOSIS — N401 Enlarged prostate with lower urinary tract symptoms: Secondary | ICD-10-CM | POA: Diagnosis not present

## 2019-09-30 DIAGNOSIS — M25511 Pain in right shoulder: Secondary | ICD-10-CM | POA: Diagnosis not present

## 2019-09-30 HISTORY — DX: Pain in unspecified hip: M25.559

## 2019-09-30 MED ORDER — NAPROXEN 250 MG PO TABS: 250.0000 mg | ORAL_TABLET | Freq: Two times a day (BID) | ORAL | 0 refills | Status: DC

## 2019-09-30 NOTE — Assessment & Plan Note (Signed)
Presented to clinic w/ shoulder pain 1 month ago. Was prescribed short course of NSAIDs. Mentions significant improvement in shoulder pain with therapy. Denies any weakness or pain

## 2019-09-30 NOTE — Patient Instructions (Signed)
Dear Mr.Leeon Katy Fitch,  Thank you for allowing [place] to provide your care today. Today we discussed your hip pain    I have ordered x-ray of your hip for you. I will call if any are abnormal.    Today we made the no changes to your medications:    Please follow-up as needed.    Should you have any questions or concerns please call the internal medicine clinic at [phone].    Thank you for choosing .   Dolor de cadera Hip Pain La cadera es la articulacin entre la parte superior de las piernas y la parte inferior de la pelvis. [employer], Charity fundraiser, los tendones y los msculos de la articulacin de la cadera sostienen el peso del cuerpo y permiten el desplazamiento. El Social research officer, government de cadera puede tener distintos grados, desde un dolor leve hasta un dolor intenso en uno o ambos lados de la cadera. El dolor puede sentirse en la parte interna de la articulacin de la cadera, cerca de la ingle, o en la parte externa, cerca de las nalgas y la parte superior de los muslos. Tambin puede estar acompaado de hinchazn o rigidez en la zona de la cadera. Siga estas instrucciones en su casa: Control del dolor, la rigidez y la hinchazn      Si se lo indican, aplique hielo sobre la zona dolorida. Para hacer esto: ? Ponga el hielo en una bolsa plstica. ? Coloque una [employer] piel y Therapist, nutritional. ? Aplique el hielo durante 90minutos, 2 a 3veces por da.  Si se lo indican, aplique calor en la zona afectada con la frecuencia que le haya indicado el mdico. Use la fuente de calor que el mdico le recomiende, como una compresa de calor hmedo o una almohadilla trmica. ? Coloque una [employer] piel y la fuente de Freight forwarder. ? Aplique calor durante 20 a 34minutos. ? Retire la fuente de calor si la piel se pone de color rojo brillante. Esto es especialmente importante si no puede sentir dolor, calor o fro. Puede correr un riesgo mayor de sufrir quemaduras. Actividad  Haga  ejercicio como se lo haya indicado el mdico.  Evite las actividades que le causan dolor. Instrucciones generales   Use los medicamentos de venta libre y los recetados solamente como te lo haya indicado el mdico.  Lleve un registro de los sntomas. Escriba los siguientes datos: ? Con qu frecuencia le duele la cadera. ? La ubicacin del dolor. ? Cmo se siente el dolor. ? Qu es lo que hace que el dolor empeore.  Duerma con una almohada entre las piernas del lado que le sea ms cmodo.  Concurra a todas las visitas de seguimiento como se lo haya indicado el mdico. Esto es importante. Comunquese con un mdico si:  No puede apoyar el peso del cuerpo [employer].  El dolor o la hinchazn continan o empeoran despus de [place].  Tiene ms dificultades para caminar.  Tiene fiebre. Solicite ayuda inmediatamente si:  Se cae.  El dolor y la hinchazn de la cadera aumentan de repente.  La cadera est enrojecida o hinchada, o muy dolorida con la palpacin. Resumen  El dolor de cadera puede tener distintos grados, desde un dolor leve hasta un dolor intenso en uno o ambos lados de la cadera.  El dolor puede sentirse en la parte interna de la articulacin de la cadera, cerca de la ingle, o en la parte externa, cerca de las nalgas  y la parte superior de los muslos.  Evite las [employer] causan dolor.  Anote la frecuencia con la que tiene dolor en la cadera, la ubicacin del dolor, qu es lo que hace que el dolor empeore y qu siente. Esta informacin no tiene Marine scientist el consejo del mdico. Asegrese de hacerle al mdico cualquier pregunta que tenga. Document Revised: 11/14/2018 Document Reviewed: 11/14/2018 Elsevier Patient Education  [place].

## 2019-09-30 NOTE — Assessment & Plan Note (Signed)
Follows with Dr.Patel with neurology for myasthenia gravis. Diagnosed in 2019. Currently on prednisone 10mg  and pyridostigmine. Previously had been on high dose steroids for initiation of therapy but titrated dose down to 10mg . Denies any generalized weakness, blurry vision, dysphagia, or respiratory distress.   - F/u with neurology

## 2019-09-30 NOTE — Telephone Encounter (Signed)
Attempted to call Danny Walker for his X-ray result with assistance of telephonic interpreter. Patient did not pick up. Voicemail left with call-back number.

## 2019-09-30 NOTE — Progress Notes (Signed)
CC: Hip pain  HPI: [name] is a 74 y.o. with PMH listed below presenting with complaint of hip pain. Please see problem based assessment and plan for further details.  Past Medical History:  Diagnosis Date  . BPH (benign prostatic hyperplasia)   . DDD (degenerative disc disease), lumbosacral   . Fatty liver   . Foley catheter in place   . History of gallstones   . History of prostatitis   . Hypertension   . IDA (iron deficiency anemia)   . Liver mass 09/17/2001   4.7cm , noted on [place] abd  . Low back pain   . Nocturia   . 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 Spondylosis Lumbar  . Umbilical hernia   . Urinary retention 02/08/2018    Review of Systems: Review of Systems  Constitutional: Negative for chills, fever and malaise/fatigue.  Eyes: Negative for blurred vision.  Respiratory: Negative for cough and shortness of breath.   Cardiovascular: Negative for chest pain and palpitations.  Gastrointestinal: Negative for constipation, diarrhea, nausea and vomiting.  Musculoskeletal: Negative for back pain and joint pain.  Neurological: Negative for dizziness, tingling, sensory change, focal weakness and headaches.     Physical Exam: Vitals:   09/30/19 0850  BP: (!) 144/73  Pulse: 63  Temp: 98.1 F (36.7 C)  SpO2: 100%  Weight: 167 lb 9.6 oz (76 kg)   Physical Exam  Constitutional: He is oriented to person, place, and time. He appears well-developed and well-nourished. No distress.  HENT:  Head: Normocephalic and atraumatic.  Mouth/Throat: Oropharynx is clear and moist.  Cardiovascular: Normal rate, regular rhythm, normal heart sounds and intact distal pulses.  No murmur heard. Respiratory: Effort normal and breath sounds normal. He has no wheezes. He has no rales.  GI: Soft. Bowel sounds are normal. He exhibits no distension. There is no abdominal tenderness.  Musculoskeletal:        General: Tenderness (+ L straight leg  test) present. No edema. Normal range of motion.     Cervical back: Normal range of motion and neck supple.  Neurological: He is alert and oriented to person, place, and time.  Skin: Skin is warm and dry.    Assessment & Plan:   Hip pain [name] is a 74 yo M w/ PMH of GERD, HTN, DJD and myasthenia gravis on chronic prednisone and pyridostigmine presenting to [facility] w/ complaint of hip pain. He mentions that he has had this L sided hip pain that occurs dorsally and radiates toward the groin as well as down the leg since two weeks ago without inciting event. Denies any trauma. Describes the pain as burning/soreness. Improved with exertion. Worsened at night time. He mentions having difficulty with ambulation due to worsening pain after working on his feet. He denies any urinary/bowel incontinence and no numbness, tingling or focal weakness. He denies any fevers or chills.  A/P: Presents with subacute hip pain. Radiation to groin concerning for hip fracture although description of quality of pain more consistent with sciatica. Considering his history of chronic steroid use for his myasthenia gravis, will get X-ray to assess for avascular necrosis or hip fracture and treat as appropriate.  - C/w NSAID therapy - X-ray L hip/pelvis  Right shoulder pain Presented to clinic w/ shoulder pain 1 month ago. Was prescribed short course of NSAIDs. Mentions significant improvement in shoulder pain with therapy. Denies any weakness or pain  Myasthenia gravis, bulbar (  [place]) 23 with Dr.Patel with neurology for myasthenia gravis. Diagnosed in 2019. Currently on prednisone 10mg  and pyridostigmine. Previously had been on high dose steroids for initiation of therapy but titrated dose down to 10mg . Denies any generalized weakness, blurry vision, dysphagia, or respiratory distress.   - F/u with neurology  BPH (benign prostatic hyperplasia) Mentions prior hx of TURP for BPH. Currently on Finasteride and Toviaz.  Denies any urinary incontinence, urgency or frequency.   Patient discussed with Dr. Dareen Piano  -Gilberto Better, PGY2 [facility] Pager: [phone]

## 2019-09-30 NOTE — Assessment & Plan Note (Signed)
Danny Walker is a 74 yo M w/ PMH of GERD, HTN, DJD and myasthenia gravis on chronic prednisone and pyridostigmine presenting to [facility] w/ complaint of hip pain. He mentions that he has had this L sided hip pain that occurs dorsally and radiates toward the groin as well as down the leg since two weeks ago without inciting event. Denies any trauma. Describes the pain as burning/soreness. Improved with exertion. Worsened at night time. He mentions having difficulty with ambulation due to worsening pain after working on his feet. He denies any urinary/bowel incontinence and no numbness, tingling or focal weakness. He denies any fevers or chills.  A/P: Presents with subacute hip pain. Radiation to groin concerning for hip fracture although description of quality of pain more consistent with sciatica. Considering his history of chronic steroid use for his myasthenia gravis, will get X-ray to assess for avascular necrosis or hip fracture and treat as appropriate.  - C/w NSAID therapy - X-ray L hip/pelvis

## 2019-09-30 NOTE — Assessment & Plan Note (Signed)
Mentions prior hx of TURP for BPH. Currently on Finasteride and Toviaz. Denies any urinary incontinence, urgency or frequency.

## 2019-10-01 NOTE — Progress Notes (Signed)
Internal Medicine Clinic Attending ° °Case discussed with Dr. Lee at the time of the visit.  We reviewed the resident’s history and exam and pertinent patient test results.  I agree with the assessment, diagnosis, and plan of care documented in the resident’s note.  °

## 2019-10-13 NOTE — Progress Notes (Signed)
CC: HTN, Myasthenia gravis, and hip and shoulder pain  HPI:  Danny Walker is a 74 y.o.  with a PMH listed below presenting for HTN, Myasthenia gravis, and hip and shoulder pain.   Please see A&P for status of the patient's chronic medical conditions  Past Medical History:  Diagnosis Date  . BPH (benign prostatic hyperplasia)   . DDD (degenerative disc disease), lumbosacral   . Fatty liver   . Foley catheter in place   . History of gallstones   . History of prostatitis   . Hypertension   . IDA (iron deficiency anemia)   . Liver mass 09/17/2001   4.7cm , noted on [place] abd  . Low back pain   . Nocturia   . 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 Spondylosis Lumbar  . Umbilical hernia   . Urinary retention 02/08/2018   Review of Systems: Refer to history of present illness and assessment and plans for pertinent review of systems, all others reviewed and negative.  Physical Exam:  Vitals:   10/14/19 1317  BP: 127/62  Pulse: 80  Temp: 98.7 F (37.1 C)  TempSrc: Oral  SpO2: 97%  Weight: 168 lb 6.4 oz (76.4 kg)  Height: 5\' 6"  (1.676 m)   Physical Exam  Constitutional: He is oriented to person, place, and time and well-developed, well-nourished, and in no distress.  Cardiovascular: Normal rate, regular rhythm and normal heart sounds.  Pulmonary/Chest: Breath sounds normal. No respiratory distress.  Abdominal: Soft. Bowel sounds are normal. He exhibits no distension.  Musculoskeletal:        General: No edema. Normal range of motion.  Neurological: He is alert and oriented to person, place, and time.  Skin: Skin is warm and dry.  Psychiatric: Mood and affect normal.    Social History   Socioeconomic History  . Marital status: Widowed    Spouse name: Not on file  . Number of children: 1  . Years of education: 14  . Highest education level: High school graduate  Occupational History  . Occupation: Airline pilot: [employer]  Tobacco Use  . Smoking status: Never Smoker  . Smokeless tobacco: Never Used  Substance and Sexual Activity  . Alcohol use: No  . Drug use: Never  . Sexual activity: Yes  Other Topics Concern  . Not on file  Social History Narrative   Lives with daughter in a one story home.  Education: high school.  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     Right handed   Social Determinants of Health   Financial Resource Strain:   . Difficulty of Paying Living Expenses:   Food Insecurity:   . Worried About Charity fundraiser in the Last Year:   . Arboriculturist in the Last Year:   Transportation Needs:   . Film/video editor (Medical):   Marland Kitchen Lack of Transportation (Non-Medical):   Physical Activity:   . Days of Exercise per Week:   . Minutes of Exercise per Session:   Stress:   . Feeling of Stress :   Social Connections:   . Frequency of Communication with Friends and Family:   . Frequency of Social Gatherings with Friends and Family:   . Attends Religious Services:   . Active Member of Clubs or Organizations:   . Attends Archivist Meetings:   Marland Kitchen Marital Status:  Intimate Partner Violence:   . Fear of Current or Ex-Partner:   . Emotionally Abused:   Marland Kitchen Physically Abused:   . Sexually Abused:    Family History  Problem Relation Age of Onset  . Hypertension Mother   . Hypertension Father     Assessment & Plan:   See Encounters Tab for problem based charting.  Patient discussed with Dr. Lynnae January;

## 2019-10-14 ENCOUNTER — Ambulatory Visit (INDEPENDENT_AMBULATORY_CARE_PROVIDER_SITE_OTHER): Payer: Medicare Other | Admitting: Internal Medicine

## 2019-10-14 VITALS — BP 127/62 | HR 80 | Temp 98.7°F | Ht 66.0 in | Wt 168.4 lb

## 2019-10-14 DIAGNOSIS — N401 Enlarged prostate with lower urinary tract symptoms: Secondary | ICD-10-CM | POA: Diagnosis not present

## 2019-10-14 DIAGNOSIS — G7000 Myasthenia gravis without (acute) exacerbation: Secondary | ICD-10-CM

## 2019-10-14 DIAGNOSIS — I10 Essential (primary) hypertension: Secondary | ICD-10-CM

## 2019-10-14 DIAGNOSIS — M25559 Pain in unspecified hip: Secondary | ICD-10-CM | POA: Diagnosis not present

## 2019-10-14 MED ORDER — NAPROXEN 250 MG PO TABS: 250.0000 mg | ORAL_TABLET | Freq: Two times a day (BID) | ORAL | 0 refills | Status: DC | PRN

## 2019-10-14 MED ORDER — FINASTERIDE 5 MG PO TABS: 5.0000 mg | ORAL_TABLET | ORAL | 3 refills | Status: DC

## 2019-10-14 NOTE — Patient Instructions (Addendum)
[name],  It was a pleasure to see you today. Thank you for coming in.   Today we discussed your hip and shoulder pain. I am glad that you are feeling better. Please start using the naproxen only as needed for the pain, and continue to take it with food when you need it. This can cause issues with you stomach if used for long periods of time, including pain, nausea, and sometimes bleeding.  We also discussed your blood pressure, this looks good today. Please continue taking your blood pressure medications.   Please follow up with the neurologist to monitor your myasthenia gravis.   Please return to clinic in 6 months or sooner if needed.   Thank you again for coming in.   [name] M.D.  [name],  Fue un placer verte hoy. Gracias por venir.  Hoy hablamos sobre su dolor de cadera y hombro. Me alegro de que se sienta mejor. Comience a Garment/textile technologist solo cuando sea necesario para Conservation officer, historic buildings y contine tomndolo con alimentos cuando lo necesite. Esto puede causar problemas estomacales si se [place] durante perodos prolongados, incluidos dolor, nuseas y, a veces, sangrado.  Tambin hablamos de su presin arterial, hoy se ve bien. Contine tomando sus medicamentos para la presin arterial.  Haga un seguimiento con el neurlogo para controlar su miastenia gravis.  Regrese a Copy en 6 meses o antes si es necesario.  Gracias de nuevo por venir.  [name].

## 2019-10-15 ENCOUNTER — Encounter: Payer: Self-pay | Admitting: Internal Medicine

## 2019-10-15 NOTE — Progress Notes (Signed)
Internal Medicine Clinic Attending  Case discussed with Dr. Krienke at the time of the visit.  We reviewed the resident's history and exam and pertinent patient test results.  I agree with the assessment, diagnosis, and plan of care documented in the resident's note.    

## 2019-10-15 NOTE — Assessment & Plan Note (Signed)
Patient currently follows with neurology for his myasthenia gravis.  Currently on  60 mg daily of pyridostigmene and 10 mg daily of prednisone.  He denies any issues with swallowing, eye movements, blurry vision, dysphagia, shortness of breath.  No changes at this time.  We will follow up with neurology 1 month. -Follow with neurology

## 2019-10-15 NOTE — Addendum Note (Signed)
Addended by: Larey Dresser A on: 10/15/2019 02:16 PM   Modules accepted: Level of Service

## 2019-10-15 NOTE — Assessment & Plan Note (Signed)
Patient has a history of BPH status post TURP, currently on finasteride and Toviaz, denies any urinary issues.

## 2019-10-15 NOTE — Assessment & Plan Note (Signed)
Patient is currently on lisinopril 40 mg daily.  Blood pressure today is well controlled at 127/60.  Last BMP on 04/15/19 was unremarkable, Ct 0.94 and electrolytes normal.  -Continue lisinopril 40 mg daily -Follow-up in 6 months, repeat BMP at that time

## 2019-10-15 NOTE — Assessment & Plan Note (Signed)
Patient was noting bilateral hip pain on his 09/30/2019 visit, noted as a burning/soreness pain, improved with exertion, difficulty with ambulation.  Obtained left hip/pelvic x-ray that showed slight symmetric narrowing of each joint, no fracture or dislocation, lower lumbar scoliosis.  Continuing on NSAID therapy. He reports that his pain has completely gone away now.  He reports that his cleared up about 7 days ago and has no issues with ambulation removed.  He is still taking the Nparoxen 250 twice daily with meals and was requesting a refill. Discussed that this is not a long-term medication and discussed the side effects of the medication.  We discussed that I will refill it but he should take this only as needed for his pain.  He expressed understanding and will take the medication with meals.  -Naproxen to 50 mg as needed -Patient is on omeprazole 40 mg daily

## 2019-12-06 ENCOUNTER — Other Ambulatory Visit: Payer: Self-pay | Admitting: Internal Medicine

## 2019-12-06 ENCOUNTER — Other Ambulatory Visit: Payer: Self-pay

## 2019-12-06 ENCOUNTER — Ambulatory Visit: Payer: Medicare Other | Admitting: Internal Medicine

## 2019-12-06 ENCOUNTER — Encounter: Payer: Self-pay | Admitting: Internal Medicine

## 2019-12-06 VITALS — BP 136/80 | HR 98 | Temp 98.4°F | Ht 66.0 in | Wt 168.6 lb

## 2019-12-06 DIAGNOSIS — I10 Essential (primary) hypertension: Secondary | ICD-10-CM

## 2019-12-13 ENCOUNTER — Encounter: Payer: Self-pay | Admitting: Internal Medicine

## 2019-12-13 NOTE — Assessment & Plan Note (Signed)
Patient left prior to being evaluated as he was running late for work. I refilled his lisinopril.

## 2019-12-16 ENCOUNTER — Other Ambulatory Visit: Payer: Self-pay

## 2019-12-16 ENCOUNTER — Ambulatory Visit (INDEPENDENT_AMBULATORY_CARE_PROVIDER_SITE_OTHER): Payer: Medicare Other | Admitting: Internal Medicine

## 2019-12-16 ENCOUNTER — Encounter: Payer: Self-pay | Admitting: Internal Medicine

## 2019-12-16 VITALS — BP 139/65 | HR 92 | Temp 98.3°F | Ht 66.0 in | Wt 169.0 lb

## 2019-12-16 DIAGNOSIS — I10 Essential (primary) hypertension: Secondary | ICD-10-CM | POA: Diagnosis not present

## 2019-12-16 DIAGNOSIS — M25559 Pain in unspecified hip: Secondary | ICD-10-CM

## 2019-12-16 DIAGNOSIS — G7000 Myasthenia gravis without (acute) exacerbation: Secondary | ICD-10-CM | POA: Diagnosis not present

## 2019-12-16 DIAGNOSIS — M25511 Pain in right shoulder: Secondary | ICD-10-CM | POA: Diagnosis present

## 2019-12-16 DIAGNOSIS — G8929 Other chronic pain: Secondary | ICD-10-CM | POA: Diagnosis not present

## 2019-12-16 MED ORDER — LISINOPRIL 40 MG PO TABS: ORAL_TABLET | ORAL | 3 refills | Status: DC

## 2019-12-16 MED ORDER — NAPROXEN 250 MG PO TABS: 250.0000 mg | ORAL_TABLET | Freq: Two times a day (BID) | ORAL | 0 refills | Status: DC | PRN

## 2019-12-16 MED ORDER — DICLOFENAC SODIUM 1 % EX GEL: 2.0000 g | Freq: Four times a day (QID) | CUTANEOUS | 3 refills | Status: DC

## 2019-12-16 NOTE — Progress Notes (Signed)
CC: shoulder pain  HPI: [name] is a 74 y.o. with PMH listed below presenting with complaint of shoulder pain. Please see problem based assessment and plan for further details.  Past Medical History:  Diagnosis Date  . BPH (benign prostatic hyperplasia)   . DDD (degenerative disc disease), lumbosacral   . Fatty liver   . Foley catheter in place   . History of gallstones   . History of prostatitis   . Hypertension   . IDA (iron deficiency anemia)   . Liver mass 09/17/2001   4.7cm , noted on [place] abd  . Low back pain   . Nocturia   . 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 Spondylosis Lumbar  . Umbilical hernia   . Urinary retention 02/08/2018   Review of Systems: Review of Systems  Constitutional: Negative for chills, fever and malaise/fatigue.  Eyes: Negative for blurred vision.  Respiratory: Positive for shortness of breath. Negative for cough and wheezing.   Cardiovascular: Negative for chest pain and leg swelling.  Gastrointestinal: Negative for constipation, diarrhea, nausea and vomiting.  Musculoskeletal: Positive for joint pain. Negative for falls and myalgias.  Neurological: Positive for weakness. Negative for dizziness.  All other systems reviewed and are negative.    Physical Exam: Vitals:   12/16/19 0851  BP: (!) 139/65  Pulse: 92  Temp: 98.3 F (36.8 C)  TempSrc: Oral  SpO2: 98%  Weight: 169 lb (76.7 kg)  Height: 5\' 6"  (1.676 m)   Gen: Well-developed, well nourished, NAD HEENT: NCAT head, hearing intact CV: RRR, S1, S2 normal Pulm: CTAB, No rales, no wheezes Extm: R shoulder passive ROM intact, active abduction limited due to pain, no focal tenderness, no erythema, edema or notable effusion, Peripheral pulses intact, No peripheral edema, Skin: Dry, Warm, normal turgor, no wounds, no rashes, no lesions  Assessment & Plan:   Right shoulder pain Danny Walker is a 74 yo M w/ PMH of BPH, myasthenia gravis and HTN  presenting to the [facility] for management of his recurrent shoulder pain. He mentions that he has been taking his naproxen as prescribed but has run out and is requesting refills. He mentions this pain has been on going for >6 months and denies any significant worsening of symptoms. He states naproxen treats the pain well. He also states he tried to start OTC topical treatment but that appeared to make it worse. 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  A/P Present w/ chronic shoulder pain. Physical exam benign except for pain with active abduction. Likely due to subacrominal bursitis +/- tendinopathy. No clinical significant tear on exam. Advised regarding risks of chronic NSAID use including GI bleed and renal injury. Discussed taking Naproxen only as needed and using prescription strength topical therapy for treatment. Danny Walker states he will give it a try.  - Topical diclofenac - Naproxen prn - Advised on stretching exercises  Essential hypertension Requests refills on blood pressure med. Mentions he has been off his bp meds for 2 weeks. Mentions significant distress and difficulty with navigating the pharmacy refill system due to language barrier. He states he is worried regarding prompt refills of his meds. Chart review shows Dr.Coe recently sent 90 day supply. Will add-on additional refills due to Danny Walker's difficulty  - Advised to c/w lisinopril - Refills sent to pharmacy  Myasthenia gravis, bulbar ([place]) Danny Walker mentions having difficulty with gait ataxia. Mentions noticing  worsening gait dysfunction and feeling weaker than usual. States he is continuing to take his medications as prescribed. He also mentions having worsening dyspnea on exertion. On exam, appears to be worsening symptoms of his mysathenia gravis. Currently vitals are stable. Advised to f/u with neurology for medication dose adjustment. Confirmed to have upcoming appointment early  August.  - F/u with neurology     Patient discussed with Dr. Heber [place]  -Gilberto Better, [facility] Pager: [phone]

## 2019-12-16 NOTE — Assessment & Plan Note (Signed)
Danny Walker mentions having difficulty with gait ataxia. Mentions noticing worsening gait dysfunction and feeling weaker than usual. States he is continuing to take his medications as prescribed. He also mentions having worsening dyspnea on exertion. On exam, appears to be worsening symptoms of his mysathenia gravis. Currently vitals are stable. Advised to f/u with neurology for medication dose adjustment. Confirmed to have upcoming appointment early August.  - F/u with neurology

## 2019-12-16 NOTE — Assessment & Plan Note (Signed)
Danny Walker is a 74 yo M w/ PMH of BPH, myasthenia gravis and HTN presenting to the [facility] for management of his recurrent shoulder pain. He mentions that he has been taking his naproxen as prescribed but has run out and is requesting refills. He mentions this pain has been on going for >6 months and denies any significant worsening of symptoms. He states naproxen treats the pain well. He also states he tried to start OTC topical treatment but that appeared to make it worse. 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  A/P Present w/ chronic shoulder pain. Physical exam benign except for pain with active abduction. Likely due to subacrominal bursitis +/- tendinopathy. No clinical significant tear on exam. Advised regarding risks of chronic NSAID use including GI bleed and renal injury. Discussed taking Naproxen only as needed and using prescription strength topical therapy for treatment. Mr.Ralls states he will give it a try.  - Topical diclofenac - Naproxen prn - Advised on stretching exercises

## 2019-12-16 NOTE — Assessment & Plan Note (Signed)
Requests refills on blood pressure med. Mentions he has been off his bp meds for 2 weeks. Mentions significant distress and difficulty with navigating the pharmacy refill system due to language barrier. He states he is worried regarding prompt refills of his meds. Chart review shows Dr.Coe recently sent 90 day supply. Will add-on additional refills due to Danny Walker's difficulty  - Advised to c/w lisinopril - Refills sent to pharmacy

## 2019-12-16 NOTE — Patient Instructions (Addendum)
Thank you for allowing [place] to provide your care today. Today we discussed your shoulder pain    I have ordered bmp labs for you. I will call if any are abnormal.    Today we made the following changes to your medications.    Please take naproxen 250mg  only as needed Please use voltaren gel 4 times daily for pain  Please follow-up in 6 months.    Should you have any questions or concerns please call the internal medicine clinic at [phone].    Shoulder Pain Many things can cause shoulder pain, including:  An injury.  Moving the shoulder in the same way again and again (overuse).  Joint pain (arthritis). Pain can come from:  Swelling and irritation (inflammation) of any part of the shoulder.  An injury to the shoulder joint.  An injury to: ? Tissues that connect muscle to bone (tendons). ? Tissues that connect bones to each other (ligaments). ? Bones. Follow these instructions at home: Watch for changes in your symptoms. Let your doctor know about them. Follow these instructions to help with your pain. If you have a sling:  Wear the sling as told by your doctor. Remove it only as told by your doctor.  Loosen the sling if your fingers: ? Tingle. ? Become numb. ? Turn cold and blue.  Keep the sling clean.  If the sling is not waterproof: ? Do not let it get wet. ? Take the sling off when you shower or bathe. Managing pain, stiffness, and swelling   If told, put ice on the painful area: ? Put ice in a plastic bag. ? Place a towel between your skin and the bag. ? Leave the ice on for 20 minutes, 2-3 times a day. Stop putting ice on if it does not help with the pain.  Squeeze a soft ball or a foam pad as much as possible. This prevents swelling in the shoulder. It also helps to strengthen the arm. General instructions  Take over-the-counter and prescription medicines only as told by your doctor.  Keep all follow-up visits as told by your doctor. This is  important. Contact a doctor if:  Your pain gets worse.  Medicine does not help your pain.  You have new pain in your arm, hand, or fingers. Get help right away if:  Your arm, hand, or fingers: ? Tingle. ? Are numb. ? Are swollen. ? Are painful. ? Turn white or blue. Summary  Shoulder pain can be caused by many things. These include injury, moving the shoulder in the same away again and again, and joint pain.  Watch for changes in your symptoms. Let your doctor know about them.  This condition may be treated with a sling, ice, and pain medicine.  Contact your doctor if the pain gets worse or you have new pain. Get help right away if your arm, hand, or fingers tingle or get numb, swollen, or painful.  Keep all follow-up visits as told by your doctor. This is important. This information is not intended to replace advice given to you by your health care provider. Make sure you discuss any questions you have with your health care provider. Document Revised: 11/21/2017 Document Reviewed: 11/21/2017 Elsevier Patient Education  [place].

## 2019-12-17 NOTE — Progress Notes (Signed)
Internal Medicine Clinic Attending  Case discussed with Dr. Lee  At the time of the visit.  We reviewed the resident's history and exam and pertinent patient test results.  I agree with the assessment, diagnosis, and plan of care documented in the resident's note.    

## 2019-12-30 ENCOUNTER — Ambulatory Visit (INDEPENDENT_AMBULATORY_CARE_PROVIDER_SITE_OTHER): Payer: Medicare Other | Admitting: Neurology

## 2019-12-30 ENCOUNTER — Encounter: Payer: Self-pay | Admitting: Neurology

## 2019-12-30 ENCOUNTER — Other Ambulatory Visit: Payer: Self-pay

## 2019-12-30 VITALS — BP 122/77 | HR 100 | Resp 20 | Ht 63.0 in | Wt 165.0 lb

## 2019-12-30 DIAGNOSIS — K5900 Constipation, unspecified: Secondary | ICD-10-CM

## 2019-12-30 DIAGNOSIS — G7001 Myasthenia gravis with (acute) exacerbation: Secondary | ICD-10-CM

## 2019-12-30 MED ORDER — PYRIDOSTIGMINE BROMIDE 60 MG PO TABS: ORAL_TABLET | ORAL | 3 refills | Status: DC

## 2019-12-30 MED ORDER — PREDNISONE 10 MG PO TABS: 20.0000 mg | ORAL_TABLET | Freq: Every day | ORAL | 5 refills | Status: DC

## 2019-12-30 NOTE — Progress Notes (Signed)
Follow-up Visit   Date: 12/30/19   Danny Walker MRN: [id number] DOB: May 04, 1946   Interim History: Danny Walker is a 74 y.o. Spanish-speaking male with history of hypertension, prostatitis, liver mass, and urinary retention returning to the clinic for follow-up of seropositive myasthenia gravis.  The patient was accompanied to the clinic by self.  Spanish interpretor was used for this visit.  History of present illness: Patient was hospitalized at [facility] from 11/11 - 04/12/2018 with 85-month history of progressive difficulty swallowing, facial weakness, vertical double vision, and 30lb weight loss.  Due to high clinical suspicion for myasthenia gravis, he was started on mestinon 60mg  TID with marked improvement.  He necessitated brief placement of NG tube for feeding and medications.  MRI brain showed chronic microischemic changes.  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  He was started on prednisone 20mg  and titrated to 60mg /d.  He was on prednisone 60mg  daily and mestinon 60mg  four times daily (4am, 10am, 4pm, 10am) since November 2019, which has successfully been tapered.  Fortunately, he has not had any relapse while coming down on prednisone and has been on 10mg  since August 2020.  Due to leg cramps, his mestinon was reduced to 30mg  BID which has helped.   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  His wife passed away in 08-16-16.   UPDATE 06/29/2019:  He is here for follow-up visit.  He remains on prednisone 10mg  daily and mestinon 30mg  at 7am and 3pm and is doing great.  No new facial weakness, double vision, ptosis, or limb weakness.  Over the past few days, he has noticed new cold sensation of the left hand before he goes to sleep, it is self-limited. There is no weakness, numbness, or tingling.  He also reports intermittent  shortness of breath, usually when working, and he tends to get agitated when this occurs.  No chest pain. He has not seen his PCP for these symptoms.   UPDATE 12/30/2019:  Over the past two months, he began having difficulty with swallowing and started to blend his food over the past two days.  He is also crushing his medications.  His speech can be slurred when he is nervous.  He reports mild weakness in the eyes, no double vision.  No arm or leg weakness. No shortness of breath.  Symptoms are worse in the evening, especially after a long day at work.  He has been compliant with taking prednisone 10mg /d and mestinon 30mg  at 7am and 3pm.  He complains of watery eyes and is scheduled to see eye doctor later this week.   Medications:  Current Outpatient Medications on File Prior to Visit  Medication Sig Dispense Refill  . diclofenac Sodium (VOLTAREN) 1 % GEL Apply 2 g topically 4 (four) times daily. 100 g 3  . famotidine (PEPCID) 20 MG tablet TOME UNA TABLETA DOS VECES AL DIA 180 tablet 1  . fesoterodine (TOVIAZ) 4 MG TB24 tablet Take 4 mg by mouth daily.    . finasteride (PROSCAR) 5 MG tablet Take 1 tablet (5 mg total) by mouth every morning. 90 tablet 3  . lisinopril (ZESTRIL) 40 MG tablet TOME UNA TABLETA TODOS LOS DIAS 90 tablet 3  . naproxen (NAPROSYN) 250 MG tablet Take 1 tablet (250 mg total) by mouth 2 (two) times daily  as needed for mild pain. 28 tablet 0  . ofloxacin (OCUFLOX) 0.3 % ophthalmic solution Instill one drop OD QID x 1 week    . omeprazole (PRILOSEC) 40 MG capsule TOME UNA CAPSULA TODOS LOS DIAS EN LA MANANA 90 capsule 3  . polyvinyl alcohol (LIQUIFILM TEARS) 1.4 % ophthalmic solution Place 1 drop into both eyes as needed for dry eyes. 15 mL 0  . Vitamin D, Ergocalciferol, (DRISDOL) 1.25 MG (50000 UNIT) CAPS capsule Take 1 capsule (50,000 Units total) by mouth every Monday. 5 capsule 2   No current facility-administered medications on file prior to visit.    Allergies: No Known  Allergies  Vital Signs:  BP 122/77   Pulse 100   Resp 20   Ht 5\' 3"  (1.6 m)   Wt 165 lb (74.8 kg)   SpO2 98%   BMI 29.23 kg/m   Neurological Exam: MENTAL STATUS including orientation to time, place, person, recent and remote memory, attention span and concentration, language, and fund of knowledge is normal.  Speech is mildly dysarthric, worse with gutteral sounds.  CRANIAL NERVES:  Normal conjugate, extra-ocular eye movements in all directions of gaze.  No ptosis at rest or with sustained upgaze.  Mild facial weakness 4+/5 in buccinator muscles and orbicularis oculi.  Orbicularis oris is 5/5  MOTOR:  Motor strength is 5/5 in all extremities, no fatigability.  REFLEXES:  2+/4 in the upper extremities.    COORDINATION/GAIT:  Easily able to stand up without using arms to push off.   Gait narrow based and stable.   Data: MRI brain wo contrast 04/03/2018: 1. Discontinued examination due to patient's cessation of choking. 2. No acute intracranial abnormality. Chronic small vessel ischemia.  CT chest w contrast 04/07/2018: No evidence of anterior mediastinal mass to suggest thymoma. No acute cardiopulmonary disease.  Labs 04/04/2018:  AChR binding 7.86*, blocking 65*, modulating 57*, TSH 1.77, CK 75, HIV neg, RPR neg   IMPRESSION/PLAN: 1.  Seropositive bulbar myasthenia gravis with exacerbation.  Diagnosed in November 2019 and treated with prednisone 60mg  with initial presentation and tapering prednisone. He had been on prednisone 10mg  since August 2020 and over the past several months began having new bulbar weakness.  - Increase prednisone to 20mg  daily  - Increase mestinon to 30mg  at 7AM, noon, and 5pm.  He has developed leg cramps in the past, so caution with titration  - MG warning signs discussed  2.  Longterm steroid side effects              - Continue omeprazole 40mg  daily              - Continue calcium 1200 mg/day and vitamin D intake of 800 international  units/day  3.  Constipation.  OTC stool softner and laxative discussed.  Follow-up with PCP, if no improvement.   Return to clinic in 6 weeks  Total time spent reviewing records, interview, history/exam, documentation, counseling, and coordination of care on day of encounter:  40 min   Thank you for allowing me to participate in patient's care.  If I can answer any additional questions, I would be pleased to do so.    Sincerely,    Patryck Kilgore K. Posey Pronto, DO

## 2019-12-30 NOTE — Patient Instructions (Addendum)
Comience con prednisona a 20 mg al da (tome dos tabletas de 10 mg)  Tome piridostigmina 30 mg (media tableta) a las 7 a. M., A las [place] a las 5 p. MRomie Minus el estreimiento, puede probar sennekot o dulcolax sin receta. Hable con su mdico de atencin primaria si no mejora.  Regreso a Biomedical engineer

## 2020-01-06 ENCOUNTER — Other Ambulatory Visit: Payer: Self-pay | Admitting: Neurology

## 2020-01-06 ENCOUNTER — Telehealth: Payer: Self-pay | Admitting: *Deleted

## 2020-01-06 MED ORDER — OSCAL 500/200 D-3 500-200 MG-UNIT PO TABS: 1.0000 | ORAL_TABLET | Freq: Two times a day (BID) | ORAL | 11 refills | Status: DC

## 2020-01-06 NOTE — Telephone Encounter (Signed)
Patient walked in requesting refill on calcium-vitamin D (OSCAL WITH D) 500-200 MG-UNIT TABS tablet at [facility] on [address] ([employer]). Spoke with patient via [place] interpreter, [place], [place]. Explained this was prescribed by Dr. Posey Pronto. Per Dr. Serita Grit OV note on 12/30/2019, patient was to continue this med but it was removed from current med list. Will forward to Dr. Posey Pronto to renew Rx. Hubbard Hartshorn, BSN, RN-BC

## 2020-01-06 NOTE — Telephone Encounter (Signed)
Patient stopped by the office and requested refills on his oyster shell and vitamin D to be sent to [facility] on [address]. He is out of the medication.

## 2020-01-06 NOTE — Telephone Encounter (Signed)
Done

## 2020-01-06 NOTE — Telephone Encounter (Signed)
Thank you for your quick response!

## 2020-01-10 ENCOUNTER — Emergency Department (HOSPITAL_COMMUNITY): Payer: Medicare Other

## 2020-01-10 ENCOUNTER — Emergency Department (HOSPITAL_COMMUNITY)
Admission: EM | Admit: 2020-01-10 | Discharge: 2020-01-11 | Disposition: A | Discharge status: Home / Self Care | Payer: Medicare Other | Attending: Emergency Medicine | Admitting: Emergency Medicine

## 2020-01-10 ENCOUNTER — Other Ambulatory Visit: Payer: Self-pay

## 2020-01-10 ENCOUNTER — Encounter (HOSPITAL_COMMUNITY): Payer: Self-pay | Admitting: Pediatrics

## 2020-01-10 DIAGNOSIS — R0602 Shortness of breath: Secondary | ICD-10-CM | POA: Insufficient documentation

## 2020-01-10 DIAGNOSIS — Z5321 Procedure and treatment not carried out due to patient leaving prior to being seen by health care provider: Secondary | ICD-10-CM | POA: Diagnosis not present

## 2020-01-10 LAB — COMPREHENSIVE METABOLIC PANEL
ALT: 28 U/L (ref 0–44)
AST: 26 U/L (ref 15–41)
Albumin: 4 g/dL (ref 3.5–5.0)
Alkaline Phosphatase: 44 U/L (ref 38–126)
Anion gap: 13 (ref 5–15)
BUN: 36 mg/dL — ABNORMAL HIGH (ref 8–23)
CO2: 21 mmol/L — ABNORMAL LOW (ref 22–32)
Calcium: 9.9 mg/dL (ref 8.9–10.3)
Chloride: 108 mmol/L (ref 98–111)
Creatinine, Ser: 1.56 mg/dL — ABNORMAL HIGH (ref 0.61–1.24)
GFR calc Af Amer: 50 mL/min — ABNORMAL LOW (ref >60)
GFR calc non Af Amer: 43 mL/min — ABNORMAL LOW (ref >60)
Glucose, Bld: 124 mg/dL — ABNORMAL HIGH (ref 70–99)
Potassium: 4.6 mmol/L (ref 3.5–5.1)
Sodium: 142 mmol/L (ref 135–145)
Total Bilirubin: 0.7 mg/dL (ref 0.3–1.2)
Total Protein: 6.7 g/dL (ref 6.5–8.1)

## 2020-01-10 LAB — CBC WITH DIFFERENTIAL/PLATELET
Abs Immature Granulocytes: 0.04 10*3/uL (ref 0.00–0.07)
Basophils Absolute: 0 10*3/uL (ref 0.0–0.1)
Basophils Relative: 0 %
Eosinophils Absolute: 0 10*3/uL (ref 0.0–0.5)
Eosinophils Relative: 0 %
HCT: 39.5 % (ref 39.0–52.0)
Hemoglobin: 11.6 g/dL — ABNORMAL LOW (ref 13.0–17.0)
Immature Granulocytes: 0 %
Lymphocytes Relative: 14 %
Lymphs Abs: 1.2 10*3/uL (ref 0.7–4.0)
MCH: 25.1 pg — ABNORMAL LOW (ref 26.0–34.0)
MCHC: 29.4 g/dL — ABNORMAL LOW (ref 30.0–36.0)
MCV: 85.5 fL (ref 80.0–100.0)
Monocytes Absolute: 0.4 10*3/uL (ref 0.1–1.0)
Monocytes Relative: 4 %
Neutro Abs: 7.3 10*3/uL (ref 1.7–7.7)
Neutrophils Relative %: 82 %
Platelets: 357 10*3/uL (ref 150–400)
RBC: 4.62 MIL/uL (ref 4.22–5.81)
RDW: 17.9 % — ABNORMAL HIGH (ref 11.5–15.5)
WBC: 9 10*3/uL (ref 4.0–10.5)
nRBC: 0 % (ref 0.0–0.2)

## 2020-01-10 LAB — TROPONIN I (HIGH SENSITIVITY)
Troponin I (High Sensitivity): 10 ng/L (ref <18)
Troponin I (High Sensitivity): 9 ng/L (ref <18)

## 2020-01-10 NOTE — ED Triage Notes (Signed)
C/O shortness of breath x 1 month, reported hx of myasthenia gravis.

## 2020-01-11 NOTE — ED Notes (Signed)
Pt called for room with no response.  °

## 2020-01-13 ENCOUNTER — Other Ambulatory Visit: Payer: Self-pay | Admitting: Internal Medicine

## 2020-01-13 DIAGNOSIS — G8929 Other chronic pain: Secondary | ICD-10-CM

## 2020-01-14 ENCOUNTER — Other Ambulatory Visit: Payer: Self-pay | Admitting: Internal Medicine

## 2020-01-14 ENCOUNTER — Emergency Department (HOSPITAL_COMMUNITY)
Admission: EM | Admit: 2020-01-14 | Discharge: 2020-01-15 | Disposition: A | Discharge status: Home / Self Care | Payer: Medicare Other | Attending: Emergency Medicine | Admitting: Emergency Medicine

## 2020-01-14 DIAGNOSIS — R1310 Dysphagia, unspecified: Secondary | ICD-10-CM | POA: Insufficient documentation

## 2020-01-14 DIAGNOSIS — Z5321 Procedure and treatment not carried out due to patient leaving prior to being seen by health care provider: Secondary | ICD-10-CM | POA: Diagnosis not present

## 2020-01-14 DIAGNOSIS — G8929 Other chronic pain: Secondary | ICD-10-CM

## 2020-01-14 DIAGNOSIS — M25511 Pain in right shoulder: Secondary | ICD-10-CM

## 2020-01-14 NOTE — ED Triage Notes (Signed)
Pt with hx of myasthenia gravis and difficulty swallowing and eating/drinking for a few days. LWBS for same a few days ago.

## 2020-01-20 ENCOUNTER — Encounter: Payer: Self-pay | Admitting: Neurology

## 2020-01-20 ENCOUNTER — Other Ambulatory Visit: Payer: Self-pay

## 2020-01-20 ENCOUNTER — Ambulatory Visit (INDEPENDENT_AMBULATORY_CARE_PROVIDER_SITE_OTHER): Payer: Medicare Other | Admitting: Neurology

## 2020-01-20 VITALS — BP 121/73 | HR 73 | Resp 18 | Ht 68.0 in | Wt 157.0 lb

## 2020-01-20 DIAGNOSIS — G7001 Myasthenia gravis with (acute) exacerbation: Secondary | ICD-10-CM | POA: Diagnosis not present

## 2020-01-20 NOTE — Patient Instructions (Addendum)
Increase prednisone 40mg  daily  We will start IVIG  We will call you Thursday morning for an update

## 2020-01-20 NOTE — Progress Notes (Signed)
Follow-up Visit   Date: 01/20/20   Danny Walker MRN: [id number] DOB: 08-13-1945   Interim History: Danny Walker is a 74 y.o. Spanish-speaking male with history of hypertension, prostatitis, liver mass, and urinary retention returning to the clinic for follow-up of seropositive myasthenia gravis.  The patient was accompanied to the clinic by self.  Spanish interpretor was used for this visit.  History of present illness: Patient was hospitalized at [facility] from 11/11 - 04/12/2018 with 16-month history of progressive difficulty swallowing, facial weakness, vertical double vision, and 30lb weight loss.  Due to high clinical suspicion for myasthenia gravis, he was started on mestinon 60mg  TID with marked improvement.  He necessitated brief placement of NG tube for feeding and medications.  MRI brain showed chronic microischemic changes.  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  He was started on prednisone 20mg  and titrated to 60mg /d.  He was on prednisone 60mg  daily and mestinon 60mg  four times daily (4am, 10am, 4pm, 10am) since November 2019, which has successfully been tapered.  Fortunately, he has not had any relapse while coming down on prednisone and has been on 10mg  since August 2020.  Due to leg cramps, his mestinon was reduced to 30mg  BID which has helped.  Patient was doing well until July 2021 when he began having bulbar weakness.   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  His wife passed away in August 22, 2016.   UPDATE Jan 09, 2020:  Over the past two months, he began having difficulty with swallowing and started to blend his food over the past two days.  He is also crushing his medications.  His speech can be slurred when he is nervous.  He reports mild weakness in the eyes, no double vision.  No arm or leg weakness. No shortness of  breath.  Symptoms are worse in the evening, especially after a long day at work.  He has been compliant with taking prednisone 10mg /d and mestinon 30mg  at 7am and 3pm.  He complains of watery eyes and is scheduled to see eye doctor later this week.   UPDATE 01/20/2020:  Patient was scheduled for sooner visit due to having progressive dysphagia and dysarthria.  He went to the ER twice and waited many hours before being seen.  He has been on prednisone 20mg  for the past 3 weeks, no improvement.  He is having difficulty with swallowing his morning pills, so now crushes them.  He denies double vision or limb weakness, but has generalized malaise and some neck discomfort.    Medications:  Current Outpatient Medications on File Prior to Visit  Medication Sig Dispense Refill  . calcium-vitamin D (OSCAL 500/200 D-3) 500-200 MG-UNIT tablet Take 1 tablet by mouth 2 (two) times daily. 60 tablet 11  . diclofenac Sodium (VOLTAREN) 1 % GEL Apply 2 g topically 4 (four) times daily. 100 g 3  . famotidine (PEPCID) 20 MG tablet TOME UNA TABLETA DOS VECES AL DIA 180 tablet 1  . fesoterodine (TOVIAZ) 4 MG TB24 tablet Take 4 mg by mouth daily.    . finasteride (PROSCAR) 5 MG tablet Take 1 tablet (5 mg total) by mouth every morning. 90 tablet 3  . lisinopril (ZESTRIL) 40 MG tablet TOME UNA TABLETA TODOS LOS DIAS 90 tablet 3  . naproxen (NAPROSYN) 250 MG tablet TOME UNA TABLETA  DOS VECES AL DIA CUANDO SEA NECESARIO PARA EL DOLOR 28 tablet 0  . ofloxacin (OCUFLOX) 0.3 % ophthalmic solution Instill one drop OD QID x 1 week    . omeprazole (PRILOSEC) 40 MG capsule TOME UNA CAPSULA TODOS LOS DIAS EN LA MANANA 90 capsule 3  . polyvinyl alcohol (LIQUIFILM TEARS) 1.4 % ophthalmic solution Place 1 drop into both eyes as needed for dry eyes. 15 mL 0  . predniSONE (DELTASONE) 10 MG tablet Take 2 tablets (20 mg total) by mouth daily with breakfast. 60 tablet 5  . pyridostigmine (MESTINON) 60 MG tablet Half tablet at 7am, 12pm, and  5pm 150 tablet 3  . Vitamin D, Ergocalciferol, (DRISDOL) 1.25 MG (50000 UNIT) CAPS capsule Take 1 capsule (50,000 Units total) by mouth every Monday. 5 capsule 2   No current facility-administered medications on file prior to visit.    Allergies: No Known Allergies  Vital Signs:  BP 121/73   Pulse 73   Resp 18   Ht 5\' 8"  (1.727 m)   Wt 157 lb (71.2 kg)   SpO2 98%   BMI 23.87 kg/m   Neurological Exam: MENTAL STATUS including orientation to time, place, person, recent and remote memory, attention span and concentration, language, and fund of knowledge is normal.  Speech is mildly dysarthric, worse with gutteral sounds.  CRANIAL NERVES:  Normal conjugate, extra-ocular eye movements in all directions of gaze.  No ptosis at rest or with sustained upgaze.  Mild facial weakness 4+/5 in buccinator muscles and orbicularis oculi.  Orbicularis oris is 4/5  MOTOR:  Motor strength is 5/5 in all extremities, no fatigability.  Neck flexion and extension is 5/5  REFLEXES:  2+/4 in the upper extremities.    COORDINATION/GAIT:  Easily able to stand up without using arms to push off.   Gait narrow based and stable.   Data: MRI brain wo contrast 04/03/2018: 1. Discontinued examination due to patient's cessation of choking. 2. No acute intracranial abnormality. Chronic small vessel ischemia.  CT chest w contrast 04/07/2018: No evidence of anterior mediastinal mass to suggest thymoma. No acute cardiopulmonary disease.  Labs 04/04/2018:  AChR binding 7.86*, blocking 65*, modulating 57*, TSH 1.77, CK 75, HIV neg, RPR neg   IMPRESSION/PLAN: 1.  Seropositive bulbar myasthenia gravis with exacerbation.  Diagnosed 03/2018 and treated with prednisone 60mg  with tapering dose since then.  He was doing great on prednisone 10mg  since August 2020, but then developed new bulbar weakness.   - Increase prednisone to 40mg  daily.  Call with update in 3 days, if no improvement start prednisone 60mg   - Start PA  for IVIG 2g/kg over 3-5 days as home infusion  - Continue mestinon 30mg  at 7am, noon, and 5pm.  - MG warning signs discussed, if he develops shortness of breath go to ER or call 911  2.  Longterm steroid side effects              - Continue omeprazole 40mg  daily              - Continue calcium 1200 mg/day and vitamin D intake of 800 international units/day  Return to clinic in 2 weeks   Thank you for allowing me to participate in patient's care.  If I can answer any additional questions, I would be pleased to do so.    Sincerely,    Tiannah Greenly K. Posey Pronto, DO

## 2020-01-24 ENCOUNTER — Other Ambulatory Visit: Payer: Self-pay

## 2020-01-24 ENCOUNTER — Inpatient Hospital Stay (HOSPITAL_COMMUNITY)
Admission: EM | Admit: 2020-01-24 | Discharge: 2020-01-29 | DRG: 057 | Disposition: A | Discharge status: Home Health | Payer: Medicare Other | Attending: Internal Medicine | Admitting: Internal Medicine

## 2020-01-24 ENCOUNTER — Emergency Department (HOSPITAL_COMMUNITY): Payer: Medicare Other

## 2020-01-24 ENCOUNTER — Encounter (HOSPITAL_COMMUNITY): Payer: Self-pay

## 2020-01-24 DIAGNOSIS — M5137 Other intervertebral disc degeneration, lumbosacral region: Secondary | ICD-10-CM | POA: Diagnosis present

## 2020-01-24 DIAGNOSIS — N400 Enlarged prostate without lower urinary tract symptoms: Secondary | ICD-10-CM | POA: Diagnosis not present

## 2020-01-24 DIAGNOSIS — D509 Iron deficiency anemia, unspecified: Secondary | ICD-10-CM | POA: Diagnosis present

## 2020-01-24 DIAGNOSIS — Z79899 Other long term (current) drug therapy: Secondary | ICD-10-CM | POA: Diagnosis not present

## 2020-01-24 DIAGNOSIS — K760 Fatty (change of) liver, not elsewhere classified: Secondary | ICD-10-CM | POA: Diagnosis not present

## 2020-01-24 DIAGNOSIS — G7001 Myasthenia gravis with (acute) exacerbation: Principal | ICD-10-CM | POA: Diagnosis present

## 2020-01-24 DIAGNOSIS — R8281 Pyuria: Secondary | ICD-10-CM | POA: Diagnosis not present

## 2020-01-24 DIAGNOSIS — R1312 Dysphagia, oropharyngeal phase: Secondary | ICD-10-CM | POA: Diagnosis not present

## 2020-01-24 DIAGNOSIS — I10 Essential (primary) hypertension: Secondary | ICD-10-CM | POA: Diagnosis present

## 2020-01-24 DIAGNOSIS — K219 Gastro-esophageal reflux disease without esophagitis: Secondary | ICD-10-CM | POA: Diagnosis not present

## 2020-01-24 DIAGNOSIS — Z8249 Family history of ischemic heart disease and other diseases of the circulatory system: Secondary | ICD-10-CM

## 2020-01-24 DIAGNOSIS — K439 Ventral hernia without obstruction or gangrene: Secondary | ICD-10-CM | POA: Diagnosis present

## 2020-01-24 DIAGNOSIS — Z20822 Contact with and (suspected) exposure to covid-19: Secondary | ICD-10-CM | POA: Diagnosis present

## 2020-01-24 DIAGNOSIS — R531 Weakness: Secondary | ICD-10-CM | POA: Diagnosis present

## 2020-01-24 HISTORY — DX: Myasthenia gravis without (acute) exacerbation: G70.00

## 2020-01-24 HISTORY — DX: Myasthenia gravis with (acute) exacerbation: G70.01

## 2020-01-24 LAB — URINALYSIS, ROUTINE W REFLEX MICROSCOPIC
Bilirubin Urine: NEGATIVE
Glucose, UA: NEGATIVE mg/dL
Hgb urine dipstick: NEGATIVE
Ketones, ur: NEGATIVE mg/dL
Nitrite: POSITIVE — AB
Protein, ur: NEGATIVE mg/dL
Specific Gravity, Urine: 1.009 (ref 1.005–1.030)
pH: 6 (ref 5.0–8.0)

## 2020-01-24 LAB — CBC WITH DIFFERENTIAL/PLATELET
Abs Immature Granulocytes: 0.1 10*3/uL — ABNORMAL HIGH (ref 0.00–0.07)
Basophils Absolute: 0 10*3/uL (ref 0.0–0.1)
Basophils Relative: 0 %
Eosinophils Absolute: 0 10*3/uL (ref 0.0–0.5)
Eosinophils Relative: 0 %
HCT: 36.9 % — ABNORMAL LOW (ref 39.0–52.0)
Hemoglobin: 11.2 g/dL — ABNORMAL LOW (ref 13.0–17.0)
Immature Granulocytes: 1 %
Lymphocytes Relative: 7 %
Lymphs Abs: 0.6 10*3/uL — ABNORMAL LOW (ref 0.7–4.0)
MCH: 26.2 pg (ref 26.0–34.0)
MCHC: 30.4 g/dL (ref 30.0–36.0)
MCV: 86.4 fL (ref 80.0–100.0)
Monocytes Absolute: 0.4 10*3/uL (ref 0.1–1.0)
Monocytes Relative: 5 %
Neutro Abs: 7.2 10*3/uL (ref 1.7–7.7)
Neutrophils Relative %: 87 %
Platelets: 205 10*3/uL (ref 150–400)
RBC: 4.27 MIL/uL (ref 4.22–5.81)
RDW: 19.1 % — ABNORMAL HIGH (ref 11.5–15.5)
WBC: 8.3 10*3/uL (ref 4.0–10.5)
nRBC: 0 % (ref 0.0–0.2)

## 2020-01-24 LAB — TSH: TSH: 1.093 u[IU]/mL (ref 0.350–4.500)

## 2020-01-24 LAB — COMPREHENSIVE METABOLIC PANEL
ALT: 31 U/L (ref 0–44)
AST: 23 U/L (ref 15–41)
Albumin: 3.9 g/dL (ref 3.5–5.0)
Alkaline Phosphatase: 36 U/L — ABNORMAL LOW (ref 38–126)
Anion gap: 11 (ref 5–15)
BUN: 32 mg/dL — ABNORMAL HIGH (ref 8–23)
CO2: 22 mmol/L (ref 22–32)
Calcium: 9.2 mg/dL (ref 8.9–10.3)
Chloride: 110 mmol/L (ref 98–111)
Creatinine, Ser: 1.25 mg/dL — ABNORMAL HIGH (ref 0.61–1.24)
GFR calc Af Amer: >60 mL/min (ref >60)
GFR calc non Af Amer: 57 mL/min — ABNORMAL LOW (ref >60)
Glucose, Bld: 118 mg/dL — ABNORMAL HIGH (ref 70–99)
Potassium: 4.4 mmol/L (ref 3.5–5.1)
Sodium: 143 mmol/L (ref 135–145)
Total Bilirubin: 0.6 mg/dL (ref 0.3–1.2)
Total Protein: 6.2 g/dL — ABNORMAL LOW (ref 6.5–8.1)

## 2020-01-24 LAB — TROPONIN I (HIGH SENSITIVITY)
Troponin I (High Sensitivity): 7 ng/L (ref <18)
Troponin I (High Sensitivity): 10 ng/L (ref <18)

## 2020-01-24 LAB — SARS CORONAVIRUS 2 BY RT PCR (HOSPITAL ORDER, PERFORMED IN ~~LOC~~ HOSPITAL LAB): SARS Coronavirus 2: NEGATIVE

## 2020-01-24 MED ORDER — PYRIDOSTIGMINE BROMIDE 60 MG/5ML PO SOLN: 60.0000 mg | ORAL | Status: DC

## 2020-01-24 MED ORDER — PANTOPRAZOLE SODIUM 40 MG IV SOLR
40.0000 mg | Freq: Every day | INTRAVENOUS | Status: DC
  Administered 2020-01-25 – 2020-01-28 (×5): 40 mg via INTRAVENOUS
  Filled 2020-01-24 (×5): qty 40

## 2020-01-24 MED ORDER — METHYLPREDNISOLONE SODIUM SUCC 40 MG IJ SOLR: 40.0000 mg | Freq: Every day | INTRAMUSCULAR | Status: DC

## 2020-01-24 MED ORDER — IMMUNE GLOBULIN (HUMAN) 10 GM/100ML IV SOLN
400.0000 mg/kg | INTRAVENOUS | Status: AC
  Administered 2020-01-24 – 2020-01-28 (×5): 30 g via INTRAVENOUS
  Filled 2020-01-24: qty 300
  Filled 2020-01-24: qty 200
  Filled 2020-01-24: qty 300
  Filled 2020-01-24: qty 200
  Filled 2020-01-24: qty 100
  Filled 2020-01-24: qty 200
  Filled 2020-01-24: qty 300

## 2020-01-24 MED ORDER — ACETAMINOPHEN 650 MG RE SUPP: 650.0000 mg | Freq: Four times a day (QID) | RECTAL | Status: DC | PRN

## 2020-01-24 MED ORDER — PYRIDOSTIGMINE BROMIDE 60 MG/5ML PO SOLN: 60.0000 mg | Freq: Every evening | ORAL | Status: DC

## 2020-01-24 MED ORDER — ACETAMINOPHEN 325 MG PO TABS: 650.0000 mg | ORAL_TABLET | Freq: Four times a day (QID) | ORAL | Status: DC | PRN

## 2020-01-24 MED ORDER — SODIUM CHLORIDE 0.9 % IV BOLUS
500.0000 mL | Freq: Once | INTRAVENOUS | Status: AC
  Administered 2020-01-24: 500 mL via INTRAVENOUS

## 2020-01-24 MED ORDER — PYRIDOSTIGMINE BROMIDE 60 MG/5ML PO SOLN
30.0000 mg | Freq: Every day | ORAL | Status: DC
  Filled 2020-01-24: qty 2.5

## 2020-01-24 MED ORDER — PREDNISONE 20 MG PO TABS: 40.0000 mg | ORAL_TABLET | Freq: Every day | ORAL | Status: DC

## 2020-01-24 MED ORDER — DIPHENHYDRAMINE HCL 50 MG/ML IJ SOLN
25.0000 mg | Freq: Once | INTRAMUSCULAR | Status: AC
  Administered 2020-01-24: 25 mg via INTRAVENOUS
  Filled 2020-01-24: qty 1

## 2020-01-24 MED ORDER — PREDNISONE 20 MG PO TABS
40.0000 mg | ORAL_TABLET | Freq: Every day | ORAL | Status: DC
Start: 2020-01-25 — End: 2020-01-24

## 2020-01-24 MED ORDER — ENOXAPARIN SODIUM 40 MG/0.4ML ~~LOC~~ SOLN
40.0000 mg | SUBCUTANEOUS | Status: DC
  Administered 2020-01-25 – 2020-01-28 (×5): 40 mg via SUBCUTANEOUS
  Filled 2020-01-24 (×5): qty 0.4

## 2020-01-24 MED ORDER — SODIUM CHLORIDE 0.9 % IV SOLN
2.0000 g | Freq: Once | INTRAVENOUS | Status: AC
  Administered 2020-01-24: 2 g via INTRAVENOUS
  Filled 2020-01-24: qty 20

## 2020-01-24 MED ORDER — PYRIDOSTIGMINE BROMIDE 60 MG/5ML PO SOLN: 30.0000 mg | Freq: Every evening | ORAL | Status: DC

## 2020-01-24 MED ORDER — PYRIDOSTIGMINE BROMIDE 60 MG PO TABS: 30.0000 mg | ORAL_TABLET | Freq: Every day | ORAL | Status: DC

## 2020-01-24 MED ORDER — INSULIN ASPART 100 UNIT/ML ~~LOC~~ SOLN
0.0000 [IU] | Freq: Three times a day (TID) | SUBCUTANEOUS | Status: DC
  Administered 2020-01-27 – 2020-01-28 (×2): 2 [IU] via SUBCUTANEOUS

## 2020-01-24 MED ORDER — ACETAMINOPHEN 325 MG PO TABS
650.0000 mg | ORAL_TABLET | ORAL | Status: DC
  Administered 2020-01-26 – 2020-01-28 (×4): 650 mg via ORAL
  Filled 2020-01-24 (×5): qty 2

## 2020-01-24 MED ORDER — PYRIDOSTIGMINE BROMIDE 60 MG PO TABS
60.0000 mg | ORAL_TABLET | Freq: Two times a day (BID) | ORAL | Status: DC
  Filled 2020-01-24 (×2): qty 1

## 2020-01-24 MED ORDER — PANTOPRAZOLE SODIUM 40 MG IV SOLR: 40.0000 mg | Freq: Two times a day (BID) | INTRAVENOUS | Status: DC

## 2020-01-24 NOTE — ED Notes (Signed)
Ig IV vital signs now Q1hr. VTBI currently 184.

## 2020-01-24 NOTE — ED Notes (Signed)
Called [place] charge, no answer.

## 2020-01-24 NOTE — H&P (Addendum)
Date: 01/24/2020               Patient Name:  Danny Walker MRN: [id number]  DOB: 14-Jun-1945 Age / Sex: 74 y.o., male   PCP: [name], MD         Medical Service: Internal Medicine Teaching Service         Attending Physician: Dr. Lucious Groves, DO    First Contact: Dr. Lisabeth Devoid Pager: [phone]  Second Contact: Dr. Marianna Payment Pager: [phone]       After Hours (After 5p/  First Contact Pager: [phone]  weekends / holidays): Second Contact Pager: [phone]   Chief Complaint: dysphagia   History of Present Illness:  [name] is a 73 year old male with PMHx of myasthenia gravis, hypertension, iron deficiency anemia, liver mass with fatty liver, and benign prostatic hyperplasia presenting with dysphagia for the past two days and generalized weakness worse in his lower extremities for the past 2 days. Patient was diagnosed with myasthenia gravis approximately one year ago and endorses medication compliance since then. He notes worsening dysphagia since this morning. He was unable to swallow food or medications and required them to be crushed. At the time of my examination he states he is able to swallow his saliva.   Patient endorses generalized weakness for one week duration with worsening weakness in bilateral lower and upper extremities over the past two days. Also endorses gait ataxia with left sided preference and associated left sided rib pain only when walking. He denies any recent falls. No alleviating or aggravating factors. He denies any fevers/chills, headaches, slurred speech, vision changes, chest pain, cough, shortness of breath, abdominal pain, nausea/vomiting/diarrhea, dysuria, leg pain.    On Tuesday, patient was evaluate by his neurologist and was told that he would be receiving IVIG on Thursday; however, he notes that this did not happen. Once he began having difficulty swallowing, he decided to come to the ED  Interpreter used upon my  examination.   Past Medical History:  Diagnosis Date  . BPH (benign prostatic hyperplasia)   . DDD (degenerative disc disease), lumbosacral   . Fatty liver   . Foley catheter in place   . History of gallstones   . History of prostatitis   . Hypertension   . IDA (iron deficiency anemia)   . Liver mass 09/17/2001   4.7cm , noted on [place] abd  . Low back pain   . Nocturia   . 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 Spondylosis Lumbar  . Umbilical hernia   . Urinary retention 02/08/2018    Meds:  No current facility-administered medications on file prior to encounter.   Current Outpatient Medications on File Prior to Encounter  Medication Sig Dispense Refill  . calcium-vitamin D (OSCAL 500/200 D-3) 500-200 MG-UNIT tablet Take 1 tablet by mouth 2 (two) times daily. 60 tablet 11  . diclofenac Sodium (VOLTAREN) 1 % GEL Apply 2 g topically 4 (four) times daily. 100 g 3  . famotidine (PEPCID) 20 MG tablet TOME UNA TABLETA DOS VECES AL DIA 180 tablet 1  . fesoterodine (TOVIAZ) 4 MG TB24 tablet Take 4 mg by mouth daily.    . finasteride (PROSCAR) 5 MG tablet Take 1 tablet (5 mg total) by mouth every morning. 90 tablet 3  . lisinopril (ZESTRIL) 40 MG tablet TOME UNA TABLETA TODOS LOS DIAS 90 tablet 3  . naproxen (NAPROSYN) 250  MG tablet TOME UNA TABLETA DOS VECES AL DIA CUANDO SEA NECESARIO PARA EL DOLOR 28 tablet 0  . ofloxacin (OCUFLOX) 0.3 % ophthalmic solution Instill one drop OD QID x 1 week    . omeprazole (PRILOSEC) 40 MG capsule TOME UNA CAPSULA TODOS LOS DIAS EN LA MANANA 90 capsule 3  . polyvinyl alcohol (LIQUIFILM TEARS) 1.4 % ophthalmic solution Place 1 drop into both eyes as needed for dry eyes. 15 mL 0  . predniSONE (DELTASONE) 10 MG tablet Take 2 tablets (20 mg total) by mouth daily with breakfast. 60 tablet 5  . pyridostigmine (MESTINON) 60 MG tablet Half tablet at 7am, 12pm, and 5pm 150 tablet 3  . Vitamin D, Ergocalciferol, (DRISDOL) 1.25 MG (50000  UNIT) CAPS capsule Take 1 capsule (50,000 Units total) by mouth every Monday. 5 capsule 2    Past Surgical History:  Procedure Laterality Date  . ERCP W/ SPHICTEROTOMY  09/17/2001  . INSERTION OF MESH N/A 02/05/2016   Procedure: INSERTION OF MESH, UMBILICAL HERNIA;  Surgeon: Armandina Gemma, MD;  Location: [facility];  Service: General;  Laterality: N/A;  . LAPAROSCOPIC CHOLECYSTECTOMY  09/22/2001  . TRANSURETHRAL RESECTION OF PROSTATE N/A 03/05/2018   Procedure: TRANSURETHRAL RESECTION OF THE PROSTATE (TURP);  Surgeon: Cleon Gustin, MD;  Location: [facility];  Service: Urology;  Laterality: N/A;  . UMBILICAL HERNIA REPAIR N/A 02/05/2016   Procedure: UMBILICAL HERNIA REPAIR WITH MESH PATCH;  Surgeon: Armandina Gemma, MD;  Location: [facility];  Service: General;  Laterality: N/A;    Allergies: Allergies as of 01/24/2020  . (No Known Allergies)    Family History:  Family History  Problem Relation Age of Onset  . Hypertension Mother   . Hypertension Father    Social History:  Patient denies any tobacco use or alcohol use or illicit drug use.   Review of Systems: A complete ROS was negative except as per HPI.   Physical Exam: Blood pressure 120/69, pulse (!) 59, temperature (!) 97.5 F (36.4 C), resp. rate 19, height 5\' 7"  (1.702 m), weight 76.2 kg, SpO2 98 %. Physical Exam Vitals and nursing note reviewed.  Constitutional:      General: He is not in acute distress.    Appearance: Normal appearance. He is not ill-appearing, toxic-appearing or diaphoretic.  HENT:     Head: Normocephalic and atraumatic.  Eyes:     Extraocular Movements: Extraocular movements intact.     Pupils: Pupils are equal, round, and reactive to light.  Cardiovascular:     Rate and Rhythm: Normal rate and regular rhythm.     Pulses: Normal pulses.     Heart sounds: Normal heart sounds. No murmur heard.  No friction rub. No gallop.   Pulmonary:     Effort:  Pulmonary effort is normal. No respiratory distress.     Breath sounds: Normal breath sounds. No stridor. No wheezing, rhonchi or rales.  Abdominal:     Palpations: Abdomen is soft.     Tenderness: There is no abdominal tenderness. There is no guarding or rebound.     Hernia: A hernia (left lower abdominal hernia appreciated. Non tender, non protruding. No strangulation) is present.  Musculoskeletal:        General: No tenderness.     Cervical back: Normal range of motion and neck supple. No tenderness.     Right lower leg: No edema.     Left lower leg: No edema.  Lymphadenopathy:  Cervical: No cervical adenopathy.  Skin:    Comments: Lower extremities cool to touch, with pallor.   Neurological:     General: No focal deficit present.     Mental Status: He is alert and oriented to person, place, and time.     Cranial Nerves: No cranial nerve deficit.     Sensory: No sensory deficit.     Motor: No weakness.     Comments: Upper and lower extremity strength 5/5 bilatearlly  Psychiatric:        Mood and Affect: Mood normal.        Behavior: Behavior normal.    EKG: personally reviewed my interpretation is rate of 95, normal sinus rhythm. LAD present. PR and QT intervals normal. No ST segment abnormalities. No changes compared to prior EKG from 01/10/20.   CXR:  Impression: 1. Nonspecific elevation of the left hemidiaphragm, similar to most recent comparison radiograph though appears progressive from studies since 2019. Adjacent streaky opacities, likely atelectatic changes. 2. Scarring and/or atelectasis at the left costophrenic sulcus. No discernible effusions.  Assessment & Plan by Problem: Principal Problem:   Myasthenia gravis with acute exacerbation [facility])   [name] is a 74 y/o M with a PMHx of seropositive bulbar myasthenia gravis, hypertension, iron deficiency anemia, liver mass with fatty liver, and benign prostatic hyperplasia presenting with dysphagia for  the past two days and generalized weakness worse in his lower extremities for the past 2 days. His neurologist had ordered home IVIG for the patient's weakness, however, patient notes they never arrived to administer the medication. He then began having episodes of dysphagia. Patient to be admitted for acute exacerbation of his myasthenia gravis and to administer IVIG. Neurology following.  #Acute Exacerbation of Myasthenia Gravis - Patient with worsening weakness and two days of dysphagia. Notes still able to swallow saliva - No weakness appreciated upon my examination.  - Patient follow by Neurology, appreciate there assistance in the patient's care. There recommendations are as follows:  1) IVIG 0.4 g/kg today, if he tolerates it well could increase to 0.8 g/kg for 2 days, will need total of 2  g/kg spread over either three or 5 days depending on tolerance.  2) Continue prednisone 40 mg for now, once he is starting to show some signs of improvement  from the IVIG, would increase to 60 mg  3) Continue Mestinon 60 mg every morning-30 mg q. Lunch-60 mg every afternoon  4) ST evaluation  5) Qshift NIF/VC - High dose steroids ordered, novolog sliding scale for episodes of hyperglycemia and protonix 40 mg IV daily for gastric mucosal protection - NPO at this time until evaluated by speech - Neuro checks every 4 hours - Daily CBC and CMP - PT/OT evaluation for patient's weakness  #Asymptomatic Pyuria - UA with nitrites, leukocytes, and many bacteria - Patient is asymptomatic - Received 2g rocephin via IV.  - Will continue to monitor and treat if patient becomes symptomatic - NPO and patient has history of urinary retention, will order finasteride once patient is cleared by speech  #History of HTN - Blood pressure upon evaluation was 120/69 - NPO at this time, holding zestril 40 mg daily  #History of BPH - NPO at this time, holding finasteride 5 mg daily   #Abdominal Wall Hernia - Left  sided abdominal wall hernia - Do not suspect strangulation or incarceration - Can be followed on outpatient basis.  DVT Prophylaxis: Lovenox 40 mg Injection Subq Diet: NPO Code Status:  Full Code  Dispo: Admit patient to Observation with expected length of stay less than 2 midnights.  SignedRiesa Pope, MD 01/24/2020, 11:37 PM  After 5pm on weekdays and 1pm on weekends: On Call pager: [phone]

## 2020-01-24 NOTE — ED Notes (Signed)
Attempted report again to [facility], rang then went busy. [facility] has arrived at this time.

## 2020-01-24 NOTE — ED Provider Notes (Signed)
  Provider Note MRN:  [id number]  Arrival date & time: 01/24/20    ED Course and Medical Decision Making  Assumed care from [facility] upon transfer.  Concern for myasthenia gravis crisis with worsening inability to swallow.  Transferred ED to ED per neurology recommendations.  On my assessment patient is in no acute distress, I see no airway compromise, normal vital signs, Dr. Leonel Ramsay of neurology made aware of patient's arrival.  Neurology recommending admission for IVIG, admitted to internal medicine service for further care.  .Critical Care Performed by: Maudie Flakes, MD Authorized by: Maudie Flakes, MD   Critical care provider statement:    Critical care time (minutes):  32   Critical care was necessary to treat or prevent imminent or life-threatening deterioration of the following conditions: Myasthenic crisis.   Critical care was time spent personally by me on the following activities:  Discussions with consultants, evaluation of patient's response to treatment, examination of patient, ordering and performing treatments and interventions, ordering and review of laboratory studies, ordering and review of radiographic studies, pulse oximetry, re-evaluation of patient's condition, obtaining history from patient or surrogate and review of old charts    Final Clinical Impressions(s) / ED Diagnoses     ICD-10-CM   1. Myasthenia gravis with exacerbation ([place])  G70.01     ED Discharge Orders    None      Discharge Instructions   None     Barth Kirks. Sedonia Small, [place] [email or web address]    Maudie Flakes, MD 01/24/20 2259

## 2020-01-24 NOTE — ED Notes (Signed)
IV IG titrated to 240 mg/kg/hr, VSS

## 2020-01-24 NOTE — ED Notes (Signed)
IV IG titrated to 120 mg/kg/hr, VSS

## 2020-01-24 NOTE — ED Notes (Signed)
Gave report to [facility]   

## 2020-01-24 NOTE — ED Provider Notes (Signed)
Center DEPT Provider Note   CSN: [id number] Arrival date & time: 01/24/20  1450     History Chief Complaint  Patient presents with  . Weakness    Danny Walker is a 74 y.o. male with pertinent past medical history of hypertension, prostatitis, liver mass, IDA, myasthenia gravis that presents the emergency department today for weakness.  Patient is Spanish-speaking, Spanish medical interpreter was used..  Patient is followed by [facility] neurology for myasthenia gravis, was diagnosed in 2019.  He was treated for this with marked improvement, was fine until last month when he started having difficulty swallowing with progressive weakness.  Has been on prednisone, has been to the ER twice last month however did not get seen due to waiting many hours.  Has been on 20 mgs of prednisone for the past 3 weeks with no improvement, neurology doubled dose of this. Is also on Mestinon TID  and was supposed to start on IVIG on Thursday.  Patient states that home health never came to start infusion, came here due to progressive weakness, dysphagia and shortness of breath.  States that for the past 2 days he has been feeling extremely weak, this morning was unable to walk.  States that he normally walks without a walker.  Also states that he was not able to eat more than 1 bite of his breakfast this morning due to difficulty swallowing, has not been able to drink water either.  States that sometimes he cannot swallow his own spit starting today.  No drooling.  States that shortness of breath has been steady, states that it is mild.  No chest pain.  No numbness, tingling, vision changes, headache, neck pain, rash, fevers, chills.  Has gotten his Covid vaccines.  No sick contacts.  No nausea, vomiting, diarrhea, back pain, abdominal pain, leg swelling. No syncope.  HPI     Past Medical History:  Diagnosis Date  . BPH (benign prostatic hyperplasia)   . DDD (degenerative disc  disease), lumbosacral   . Fatty liver   . Foley catheter in place   . History of gallstones   . History of prostatitis   . Hypertension   . IDA (iron deficiency anemia)   . Liver mass 09/17/2001   4.7cm , noted on [place] abd  . Low back pain   . Nocturia   . 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 Spondylosis Lumbar  . Umbilical hernia   . Urinary retention 02/08/2018    Patient Active Problem List   Diagnosis Date Noted  . Hip pain 09/30/2019  . Right shoulder pain 08/20/2019  . Health care maintenance 12/25/2018  . Gastroesophageal reflux disease 04/18/2018  . Essential hypertension 04/18/2018  . Protein-calorie malnutrition, severe 04/06/2018  . 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 Bulbar weakness ([place]) 04/03/2018  . Unintentional weight loss 04/03/2018  . BPH (benign prostatic hyperplasia) 03/05/2018  . Umbilical hernia 53/29/9242    Past Surgical History:  Procedure Laterality Date  . ERCP W/ SPHICTEROTOMY  09/17/2001  . INSERTION OF MESH N/A 02/05/2016   Procedure: INSERTION OF MESH, UMBILICAL HERNIA;  Surgeon: Armandina Gemma, MD;  Location: [place];  Service: General;  Laterality: N/A;  . LAPAROSCOPIC CHOLECYSTECTOMY  09/22/2001  . TRANSURETHRAL RESECTION OF PROSTATE N/A 03/05/2018   Procedure: TRANSURETHRAL RESECTION OF THE PROSTATE (TURP);  Surgeon: Cleon Gustin, MD;  Location: [facility];  Service: Urology;  Laterality:  N/A;  . UMBILICAL HERNIA REPAIR N/A 02/05/2016   Procedure: UMBILICAL HERNIA REPAIR WITH MESH PATCH;  Surgeon: Armandina Gemma, MD;  Location: [place];  Service: General;  Laterality: N/A;       Family History  Problem Relation Age of Onset  . Hypertension Mother   . Hypertension Father     Social History   Tobacco Use  . Smoking status: Never Smoker  . Smokeless tobacco: Never Used  Vaping Use  . Vaping Use: Never used  Substance Use Topics  . Alcohol  use: No  . Drug use: Never    Home Medications Prior to Admission medications   Medication Sig Start Date End Date Taking? Authorizing Provider  calcium-vitamin D (OSCAL 500/200 D-3) 500-200 MG-UNIT tablet Take 1 tablet by mouth 2 (two) times daily. 01/06/20   Narda Amber K, DO  diclofenac Sodium (VOLTAREN) 1 % GEL Apply 2 g topically 4 (four) times daily. 12/16/19   Mosetta Anis, MD  famotidine (PEPCID) 20 MG tablet TOME UNA TABLETA DOS VECES AL DIA 03/04/19   Narda Amber K, DO  fesoterodine (TOVIAZ) 4 MG TB24 tablet Take 4 mg by mouth daily.    [provider]  finasteride (PROSCAR) 5 MG tablet Take 1 tablet (5 mg total) by mouth every morning. 10/14/19   [name], MD  lisinopril (ZESTRIL) 40 MG tablet TOME UNA TABLETA TODOS LOS DIAS 12/16/19   Mosetta Anis, MD  naproxen (NAPROSYN) 250 MG tablet TOME UNA TABLETA DOS VECES AL DIA CUANDO SEA NECESARIO PARA EL DOLOR 01/14/20   [name], MD  ofloxacin (OCUFLOX) 0.3 % ophthalmic solution Instill one drop OD QID x 1 week 03/26/19   [provider]  omeprazole (PRILOSEC) 40 MG capsule TOME UNA CAPSULA TODOS LOS DIAS EN LA [facility] 09/18/19   Daleena Rotter, Arvin Collard K, DO  polyvinyl alcohol (LIQUIFILM TEARS) 1.4 % ophthalmic solution Place 1 drop into both eyes as needed for dry eyes. 04/12/18   [place], Carley D, DO  predniSONE (DELTASONE) 10 MG tablet Take 2 tablets (20 mg total) by mouth daily with breakfast. 12/30/19   Narda Amber K, DO  pyridostigmine (MESTINON) 60 MG tablet Half tablet at 7am, 12pm, and 5pm 12/30/19   Narda Amber K, DO  Vitamin D, Ergocalciferol, (DRISDOL) 1.25 MG (50000 UNIT) CAPS capsule Take 1 capsule (50,000 Units total) by mouth every Monday. 08/26/19   [name], MD    Allergies    Patient has no known allergies.  Review of Systems   Review of Systems  Constitutional: Negative for chills, diaphoresis, fatigue and fever.  HENT: Positive for trouble swallowing. Negative for  congestion and sore throat.   Eyes: Negative for pain and visual disturbance.  Respiratory: Positive for shortness of breath. Negative for cough and wheezing.   Cardiovascular: Negative for chest pain, palpitations and leg swelling.  Gastrointestinal: Negative for abdominal distention, abdominal pain, diarrhea, nausea and vomiting.  Genitourinary: Negative for difficulty urinating.  Musculoskeletal: Negative for back pain, neck pain and neck stiffness.  Skin: Negative for pallor.  Neurological: Positive for weakness. Negative for dizziness, tremors, syncope, facial asymmetry, speech difficulty, light-headedness, numbness and headaches.  Psychiatric/Behavioral: Negative for confusion. The patient is not nervous/anxious and is not hyperactive.     Physical Exam Updated Vital Signs BP 125/73   Pulse 62   Temp 97.8 F (36.6 C) (Oral)   Resp 18   Ht 5\' 7"  (1.702 m)   Wt 76.2 kg  SpO2 98%   BMI 26.31 kg/m   Physical Exam Constitutional:      General: He is not in acute distress.    Appearance: Normal appearance. He is not ill-appearing, toxic-appearing or diaphoretic.     Comments: Patient is handling secretions.  No drooling.  Speaking to me in full sentences.  No respiratory distress.  HENT:     Head: Normocephalic and atraumatic.     Mouth/Throat:     Mouth: Mucous membranes are moist.     Pharynx: Oropharynx is clear.  Eyes:     General: No scleral icterus.    Extraocular Movements: Extraocular movements intact.     Pupils: Pupils are equal, round, and reactive to light.  Cardiovascular:     Rate and Rhythm: Normal rate and regular rhythm.     Pulses: Normal pulses.     Heart sounds: Normal heart sounds.  Pulmonary:     Effort: Pulmonary effort is normal. No respiratory distress.     Breath sounds: Normal breath sounds. No stridor. No wheezing, rhonchi or rales.  Chest:     Chest wall: No tenderness.  Abdominal:     General: Abdomen is flat. There is no distension.      Palpations: Abdomen is soft.     Tenderness: There is no abdominal tenderness. There is no guarding or rebound.  Musculoskeletal:        General: No swelling or tenderness. Normal range of motion.     Cervical back: Normal range of motion and neck supple. No rigidity.     Right lower leg: No edema.     Left lower leg: No edema.  Skin:    General: Skin is warm and dry.     Capillary Refill: Capillary refill takes less than 2 seconds.     Coloration: Skin is not pale.  Neurological:     General: No focal deficit present.     Mental Status: He is alert and oriented to person, place, and time.     Comments: Alert and oriented times 3. Clear speech. No facial droop. CNIII-XII grossly intact. Bilateral upper and lower extremities' sensation grossly intact. 4/5 symmetric strength with grip strength and with plantar and dorsi flexion bilaterally, globally weak. Patellar DTRs are 3+ and symmetric on patellar, upper extremities 2+ and equal. Normal finger to nose bilaterally. Negative pronator drift.    Psychiatric:        Mood and Affect: Mood normal.        Behavior: Behavior normal.     ED Results / Procedures / Treatments   Labs (all labs ordered are listed, but only abnormal results are displayed) Labs Reviewed  COMPREHENSIVE METABOLIC PANEL - Abnormal; Notable for the following components:      Result Value   Glucose, Bld 118 (*)    BUN 32 (*)    Creatinine, Ser 1.25 (*)    Total Protein 6.2 (*)    Alkaline Phosphatase 36 (*)    GFR calc non Af Amer 57 (*)    All other components within normal limits  CBC WITH DIFFERENTIAL/PLATELET - Abnormal; Notable for the following components:   Hemoglobin 11.2 (*)    HCT 36.9 (*)    RDW 19.1 (*)    Lymphs Abs 0.6 (*)    Abs Immature Granulocytes 0.10 (*)    All other components within normal limits  TSH  URINALYSIS, ROUTINE W REFLEX MICROSCOPIC  TROPONIN I (HIGH SENSITIVITY)  TROPONIN I (HIGH SENSITIVITY)  TROPONIN I (  HIGH SENSITIVITY)     EKG EKG Interpretation  Date/Time:  Friday January 24 2020 15:03:55 EDT Ventricular Rate:  92 PR Interval:    QRS Duration: 88 QT Interval:  341 QTC Calculation: 422 R Axis:   -49 Text Interpretation: Age not entered, assumed to be  74 years old for purpose of ECG interpretation Sinus rhythm LAD, consider left anterior fascicular block No significant change since 01/10/2020 Confirmed by Veryl Speak [phone]) on 01/24/2020 3:41:50 PM   Radiology DG Chest Portable 1 View  Result Date: 01/24/2020 CLINICAL DATA:  Shortness of breath, difficulty eating and drinking EXAM: PORTABLE CHEST 1 VIEW COMPARISON:  Radiograph 01/10/2020 FINDINGS: Nonspecific elevation of the left hemidiaphragm. This is similar to most recent comparison radiograph though appears progressive from studies since 2019. Some adjacent streaky opacities, likely atelectatic changes are present. Blunting of the left costophrenic sulcus may reflect some scarring or atelectasis without convincing features of large sub pleural effusion. No right effusion or pneumothorax. No consolidative opacity. Lungs are otherwise clear. The aorta is calcified. The remaining cardiomediastinal contours are unremarkable. No acute osseous or soft tissue abnormality. Degenerative changes are present in the imaged spine and shoulders. Telemetry leads overlie the chest. IMPRESSION: 1. Nonspecific elevation of the left hemidiaphragm, similar to most recent comparison radiograph though appears progressive from studies since 2019. Adjacent streaky opacities, likely atelectatic changes. 2. Scarring and/or atelectasis at the left costophrenic sulcus. No discernible effusions. Electronically Signed   By: Lovena Le M.D.   On: 01/24/2020 16:12    Procedures Procedures (including critical care time)  Medications Ordered in ED Medications  sodium chloride 0.9 % bolus 500 mL (500 mLs Intravenous New Bag/Given (Non-Interop) 01/24/20 1537)    ED Course  I have  reviewed the triage vital signs and the nursing notes.  Pertinent labs & imaging results that were available during my care of the patient were reviewed by me and considered in my medical decision making (see chart for details).    MDM Rules/Calculators/A&P                          Matteus Mcnelly is a 74 y.o. male with pertinent past medical history of hypertension, prostatitis, liver mass, IDA, myasthenia gravis that presents the emergency department today for weakness.  Patient most likely having myasthenia gravis exacerbation.  However will obtain basic labs at this time.  Patient is not in any respiratory stress, is handling secretions and able to swallow for me.  CBC and CMP without any abnormalities.  TSH normal.  Will consult neurology at this time.  [place] to neurology, Dr. Lorrin Goodell who recommends ED to ED transfer to [facility] due to nature of decompensation.  Since patient is already having minimal dysphagia this morning and sometimes with saliva, he thinks that the airway can be compromised.  Currently patient is sitting up in bed, handling secretions and able to swallow for me.  No need for intubation at this time.  600 spoke to Dr. Sedonia Small, who will accept patient.  The patient appears reasonably stabilized for transfer considering the current resources, flow, and capabilities available in the ED at this time.  I discussed this case with my attending physician who cosigned this note including patient's presenting symptoms, physical exam, and planned diagnostics and interventions. Attending physician stated agreement with plan or made changes to plan which were implemented.   Final Clinical Impression(s) / ED Diagnoses Final diagnoses:  Myasthenia gravis with exacerbation (  [facility])    Rx / [place] Orders ED Discharge Orders    None       Alfredia Client, PA-C 01/24/20 1810    Veryl Speak, MD 01/24/20 2148

## 2020-01-24 NOTE — ED Notes (Signed)
Informed pt we were going to have to cath him for urine. Pt voiding now via the urinal.

## 2020-01-24 NOTE — Consult Note (Addendum)
Neurology Consultation Reason for Consult: Myasthenia gravis Referring Physician: Viona Gilmore  CC: Myasthenia gravis  History is obtained from: Patient via interpreter  HPI: Danny Walker is a 74 y.o. male with a history of myasthenia gravis who was diagnosed in November 2019 with positive antibodies.  He has been managed on Mestinon and prednisone since that time.  He has been having worsening of his myasthenic symptoms for the past few weeks.  He was increased from 20 mg to 40 mg of prednisone about 8 days ago.  At that time, his outpatient neurologist had tried to get IVIG approved through home health, but despite the patient thinking that it was supposed to start yesterday, he has not had any treatment.  He is not sure what happened.  He was managing until today at which point he started having significant trouble swallowing, even having some difficulty managing his secretions and for that reason he sought care in the emergency department.  He notices occasional diplopia, though does not have any currently.  He has noticed increasing generalized weakness as well.   ROS: A 14 point ROS was performed and is negative except as noted in the HPI.   Past Medical History:  Diagnosis Date  . BPH (benign prostatic hyperplasia)   . DDD (degenerative disc disease), lumbosacral   . Fatty liver   . Foley catheter in place   . History of gallstones   . History of prostatitis   . Hypertension   . IDA (iron deficiency anemia)   . Liver mass 09/17/2001   4.7cm , noted on [place] abd  . Low back pain   . Nocturia   . 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 Spondylosis Lumbar  . Umbilical hernia   . Urinary retention 02/08/2018     Family History  Problem Relation Age of Onset  . Hypertension Mother   . Hypertension Father      Social History:  reports that he has never smoked. He has never used smokeless tobacco. He reports that he does not drink alcohol and does  not use drugs.   Exam: Current vital signs: BP (!) 152/90   Pulse 60   Temp 98.1 F (36.7 C) (Oral)   Resp 14   Ht 5\' 7"  (1.702 m)   Wt 76.2 kg   SpO2 99%   BMI 26.31 kg/m  Vital signs in last 24 hours: Temp:  [97.8 F (36.6 C)-98.1 F (36.7 C)] 98.1 F (36.7 C) (09/03 1830) Pulse Rate:  [56-94] 60 (09/03 1830) Resp:  [12-28] 14 (09/03 1830) BP: (108-152)/(67-114) 152/90 (09/03 1830) SpO2:  [93 %-100 %] 99 % (09/03 1830) Weight:  [76.2 kg] 76.2 kg (09/03 1505)   Physical Exam  Constitutional: Appears well-developed and well-nourished.  Psych: Affect appropriate to situation Eyes: No scleral injection HENT: No OP obstrucion MSK: no joint deformities.  Cardiovascular: Normal rate and regular rhythm.  Respiratory: Effort normal, non-labored breathing GI: Soft.  No distension. There is no tenderness.  Skin: WDI  Neuro: Mental Status: Patient is awake, alert, oriented to person, place, month, year, and situation. Patient is able to give a clear and coherent history. No signs of aphasia or neglect Cranial Nerves: II: Visual Fields are full. Pupils are equal, round, and reactive to light.   III,IV, VI: EOMI without ptosis or diploplia.  He has no ptosis or diplopia with sustained upgaze for 2 minutes. V: Facial sensation is symmetric to temperature  VII: Facial movement is symmetric.  VIII: hearing is intact to voice X: Uvula elevates symmetrically XI: Shoulder shrug is symmetric. XII: tongue is midline without atrophy or fasciculations.  Motor: Tone is normal. Bulk is normal. 5/5 strength was present in all four extremities.  Sensory: Sensation is symmetric to light touch and temperature in the arms and legs. Cerebellar: No clear ataxia    I have reviewed labs in epic and the results pertinent to this consultation are: Urinalysis-nitrite positive with 6- 10 WBC  Impression: 74 year old male with a history of myasthenia gravis who presents with worsening  dysphagia.  Given that I am concerned that he may progress to the point of not being able to swallow his medications very well, I think that rapid access to IVIG is important.  Given that it is not started as an outpatient, I would favor having him admitted to receive it as an inpatient and also be evaluated by speech therapy.  Recommendations: 1) IVIG 0.4 g/kg today, if he tolerates it well could increase to 0.8 g/kg for 2 days, will need total of 2 g/kg spread over either three or 5 days depending on tolerance. 2) continue prednisone 40 mg for now, once he is starting to show some signs of improvement from the IVIG, would increase to 60 mg 3) continue Mestinon 60 mg every morning-30 mg q. Lunch-60 mg every afternoon 4) ST evaluation 5) Qshift NIF/VC 6) Neurology will follow.   Roland Rack, MD [facility] [phone]  If 7pm- 7am, please page neurology on call as listed in [place].

## 2020-01-24 NOTE — ED Triage Notes (Signed)
Pt BIBA from home. Pt states that for one year he has had difficulty eating and drinking. Pt states that today he feels more weak than normal. Pt has not been able to eat or drink at all today.  Pt states he was supposed to have an injection Thursday, but home health did not show up.  Hx of myasthenia gravis.   Pt has had COVID vaccine.

## 2020-01-24 NOTE — ED Notes (Addendum)
Called [facility] ED, got secretary who transferred me to charge. Charge phone rang then went to voicemail, which was full.

## 2020-01-24 NOTE — ED Notes (Signed)
Attempted to call report to [facility] ED, no answer after 2 minutes.

## 2020-01-24 NOTE — ED Notes (Signed)
Pt aware UA needed, urinal at bedside.

## 2020-01-24 NOTE — ED Notes (Signed)
IV IG titrated to 60 mg/kg/hr, VSS

## 2020-01-25 LAB — CBG MONITORING, ED
Glucose-Capillary: 76 mg/dL (ref 70–99)
Glucose-Capillary: 96 mg/dL (ref 70–99)
Glucose-Capillary: 109 mg/dL — ABNORMAL HIGH (ref 70–99)
Glucose-Capillary: 111 mg/dL — ABNORMAL HIGH (ref 70–99)

## 2020-01-25 LAB — CBC
HCT: 33.6 % — ABNORMAL LOW (ref 39.0–52.0)
Hemoglobin: 9.9 g/dL — ABNORMAL LOW (ref 13.0–17.0)
MCH: 26 pg (ref 26.0–34.0)
MCHC: 29.5 g/dL — ABNORMAL LOW (ref 30.0–36.0)
MCV: 88.2 fL (ref 80.0–100.0)
Platelets: 168 10*3/uL (ref 150–400)
RBC: 3.81 MIL/uL — ABNORMAL LOW (ref 4.22–5.81)
RDW: 19 % — ABNORMAL HIGH (ref 11.5–15.5)
WBC: 6.3 10*3/uL (ref 4.0–10.5)
nRBC: 0 % (ref 0.0–0.2)

## 2020-01-25 LAB — COMPREHENSIVE METABOLIC PANEL WITH GFR
ALT: 23 U/L (ref 0–44)
AST: 17 U/L (ref 15–41)
Albumin: 2.9 g/dL — ABNORMAL LOW (ref 3.5–5.0)
Alkaline Phosphatase: 32 U/L — ABNORMAL LOW (ref 38–126)
Anion gap: 7 (ref 5–15)
BUN: 21 mg/dL (ref 8–23)
CO2: 24 mmol/L (ref 22–32)
Calcium: 8.7 mg/dL — ABNORMAL LOW (ref 8.9–10.3)
Chloride: 110 mmol/L (ref 98–111)
Creatinine, Ser: 1.09 mg/dL (ref 0.61–1.24)
GFR calc Af Amer: >60 mL/min
GFR calc non Af Amer: >60 mL/min
Glucose, Bld: 87 mg/dL (ref 70–99)
Potassium: 4.1 mmol/L (ref 3.5–5.1)
Sodium: 141 mmol/L (ref 135–145)
Total Bilirubin: 0.5 mg/dL (ref 0.3–1.2)
Total Protein: 5.9 g/dL — ABNORMAL LOW (ref 6.5–8.1)

## 2020-01-25 MED ORDER — METHYLPREDNISOLONE SODIUM SUCC 40 MG IJ SOLR
16.0000 mg | Freq: Every day | INTRAMUSCULAR | Status: DC
  Administered 2020-01-26 – 2020-01-27 (×2): 16 mg via INTRAVENOUS
  Filled 2020-01-25 (×2): qty 1

## 2020-01-25 MED ORDER — PYRIDOSTIGMINE BROMIDE 60 MG/5ML PO SOLN
60.0000 mg | Freq: Four times a day (QID) | ORAL | Status: DC
  Filled 2020-01-25: qty 5

## 2020-01-25 MED ORDER — METHYLPREDNISOLONE SODIUM SUCC 40 MG IJ SOLR: 30.0000 mg | Freq: Every day | INTRAMUSCULAR | Status: DC

## 2020-01-25 MED ORDER — PYRIDOSTIGMINE BROMIDE 10 MG/2ML IV SOLN
1.0000 mg | Freq: Three times a day (TID) | INTRAVENOUS | Status: DC
  Administered 2020-01-25 – 2020-01-27 (×6): 1 mg via INTRAVENOUS
  Filled 2020-01-25 (×8): qty 0.2

## 2020-01-25 MED ORDER — PYRIDOSTIGMINE BROMIDE 10 MG/2ML IV SOLN: 2.0000 mg | INTRAVENOUS | Status: DC

## 2020-01-25 MED ORDER — PYRIDOSTIGMINE BROMIDE 10 MG/2ML IV SOLN
2.0000 mg | Freq: Once | INTRAVENOUS | Status: DC
  Filled 2020-01-25 (×2): qty 0.4

## 2020-01-25 MED ORDER — METHYLPREDNISOLONE SODIUM SUCC 40 MG IJ SOLR
40.0000 mg | Freq: Every day | INTRAMUSCULAR | Status: DC
  Administered 2020-01-25: 40 mg via INTRAVENOUS
  Filled 2020-01-25: qty 1

## 2020-01-25 NOTE — Progress Notes (Signed)
RT note: With good effort patient had a NIF of -45 and VC 1.47.

## 2020-01-25 NOTE — Progress Notes (Addendum)
NEUROLOGY CONSULTATION PROGESS NOTE   Date of service: January 25, 2020 Patient Name: Danny Walker MRN:  [id number] DOB:  07-03-1945  Interval Hx  He feels worse than when he came in. He is having difficulty with swallowing and has not eaten anything. Physical Exam   Neurologic Examination  Mental status/Cognition: Alert, oriented to self, place, month and year, good attention. Speech/language: hypophonic with no nasal tone. Fluent, comprehension intact, object naming intact, repetition intact. Cranial nerves:   CN II Pupils equal and reactive to light, no VF deficits   CN III,IV,VI EOM intact, no gaze preference or deviation, no nystagmus, BL lid drooping.   CN V normal sensation in V1, V2, and V3 segments bilaterally   CN VII no asymmetry, no nasolabial fold flattening   CN VIII normal hearing to speech   CN IX & X normal palatal elevation, no uvular deviation   CN XI 5/5 head turn and 5/5 shoulder shrug bilaterally   CN XII midline tongue protrusion   Motor:  Muscle bulk: normal, tone normal  Neck Flexion: 3/5 Neck Extension: 4/5  LUE and RUE: 5/5 LLE and RLE: 5/5   Sensation:  Light touch intact   Pin prick    Temperature    Vibration   Proprioception    Labs   Lab Results  Component Value Date   NA 141 01/25/2020   K 4.1 01/25/2020   CL 110 01/25/2020   CO2 24 01/25/2020   GLUCOSE 87 01/25/2020   BUN 21 01/25/2020   CREATININE 1.09 01/25/2020   CALCIUM 8.7 (L) 01/25/2020   ALBUMIN 2.9 (L) 01/25/2020   AST 17 01/25/2020   ALT 23 01/25/2020   ALKPHOS 32 (L) 01/25/2020   BILITOT 0.5 01/25/2020   GFRNONAA >60 01/25/2020   GFRAA >60 01/25/2020     Imaging and Diagnostic studies  Reviewed Chemistry and LFT.  Impression  Danny Walker is a 74 year old male with a history of myasthenia gravis who presents with worsening dysphagia concerning for MG exacerbation. He is strong in his extremities but does seem to have bulbar weakness.  The most  direct clinical exam measure of weak diaphragm is neck flexion. He does have neck flexion weakness and that worries me of potential for worsening respiratory failure. It is important to denote that Vitals should not be used to monitor for myasthenia exacerbation. Waiting for vitals to derange before considering intubation can be harmful for our patients. NIFs and VC are an acceptable measure of monitoring for wrosening respiratory status in myasthenics.  Recommendations  - Cont IVIG 400mg /Kg daily x 5 days - Every 4 hours NIFs and VC.(I do not see any documented NIFs and VC) - SLP to evaluate swallow - continue Prednisone 40mg  daily - Recommend elective intubation if VC falls below 15 to 20 mL/kg and or NIFs falls betlow -20cm/H2O. - Can transition Mestinon from PO to IV.(30mg  PO is equivalent to 1mg  IV) - Medications that may worsen or trigger MG exacerbation: Class IA antiarrhythmics, magnesium, flouroquinolones, macrolides, aminoglycosides, penicillamine, curare, interferon alpha, botox, quinine. Use with caution: CCBs, BBs, statins. ______________________________________________________________________   Thank you for the opportunity to take part in the care of this patient. If you have any further questions, please contact the neurology consultation attending.  Signed,  [place] Pager Number [phone]

## 2020-01-25 NOTE — Progress Notes (Signed)
RT note: Patient with good effort pulled a -40 NIF and had a VC of 1.4.

## 2020-01-25 NOTE — Progress Notes (Signed)
Subjective: The patient is having significant difficulty speaking clearly this morning. Swallowing his saliva is also a challenge and he is concerned about having this improve as soon as possible. He also requested that we call his step-daughter, [name], and tell her that he is here in the hospital.  Objective:  Vital signs in last 24 hours: Vitals:   01/25/20 0815 01/25/20 0830 01/25/20 0845 01/25/20 0900  BP: (!) 160/79 120/74 117/78 (!) 135/93  Pulse: 74 (!) 59 61 83  Resp: (!) 21 18 15 14   Temp:      TempSrc:      SpO2: 96% 94% 98% 95%  Weight:      Height:       Weight change:   Intake/Output Summary (Last 24 hours) at 01/25/2020 1102 Last data filed at 01/24/2020 1637 Gross per 24 hour  Intake 500 ml  Output --  Net 500 ml    Physical Exam Constitutional:      Appearance: He is normal weight.  HENT:     Head: Normocephalic and atraumatic.  Eyes:     Extraocular Movements: Extraocular movements intact.     Conjunctiva/sclera: Conjunctivae normal.  Cardiovascular:     Rate and Rhythm: Normal rate and regular rhythm.     Heart sounds: Normal heart sounds.  Pulmonary:     Effort: Pulmonary effort is normal.     Breath sounds: Normal breath sounds.  Abdominal:     General: Bowel sounds are normal. Hernia of left lower abdomen    Palpations: Abdomen is soft.  Skin:    General: Skin is warm and dry.  Neurological:     Mental Status: He is alert and oriented to person, place, and time.     Sensory: No sensory deficit.     Motor: Weakness present in facial muscles. His smile is symmetric but weak. He is able to track with his eyes.      Coordination: Coordination normal.  Psychiatric:        Mood and Affect: Mood normal.        Behavior: Behavior normal.        Thought Content: Thought content normal.     Assessment/Plan:  Principal Problem:   Myasthenia gravis with acute exacerbation (HCC)  Gabrian Hoque is a 74 year old man with PMH of MG, HTN,  iron deficiency and anemia, BPH, and fatty liver w/ h/o mass who presented with increased difficulty swallowing for two days.    Myasthenia gravis flare Patient unable to swallow. - Neurology consulted, recommended stopping IVIG and pyridostigmine, will follow further recs - Continue steroids (40 mg solu-medrol daily) - SLP evaluation, NPO - Monitor respirations and ability to breathe given his swallowing difficulty - PT/OT evaluation  Asymptomatic Pyuria - UA with nitrites, leukocytes, and many bacteria - Patient is asymptomatic - Received 2g rocephin via IV.  - Will continue to monitor and treat if patient becomes symptomatic - NPO and patient has history of urinary retention, will order finasteride once patient is cleared by speech  History of HTN Blood pressure 120s-140s/70s-90s - NPO at this time, holding zestril 40 mg daily  History of BPH - NPO at this time, holding finasteride 5 mg daily   Abdominal Wall Hernia - Left sided abdominal wall hernia - Do not suspect strangulation or incarceration - Can be followed on outpatient basis.  DVT Prophylaxis: Lovenox 40 mg Injection Subq Diet: NPO Code Status: Full Code   LOS: 1 day   Andre Swander,  Christian Mate, Medical Student 01/25/2020, 11:02 AM

## 2020-01-25 NOTE — Hospital Course (Addendum)
Danny Walker is a 74 year old man with PMH of MG, HTN, iron deficiency and anemia, BPH, and fatty liver w/ h/o mass who presented with increased difficulty swallowing for two days.   Myasthenia gravis flare Patient unable to swallow. After neurology was consulted, they recommended stopping IVIG and pyridostigmine, which he had received in the ED. These were then restarted for a 5-day course of IVIG and long-term pyridostigmine. We continued steroids (40 and then 50 mg solu-medrol daily) throughout his hospital stay. SLP followed closely and advanced his diet as appropriate given his significant oropharyngeal phase dysphagia. PT/OT were consulted and recommended 2-3x/week therapy while inpatient, but by the time of his discharge they had no recommendations for home PT or OT other than supervision by family. RT followed closely for q4 -6 NIF and VC measurements to monitor his respiratory status. These were stable throughout his hospitalization and he never required ventilatory support.  Asymptomatic Pyuria His UA on admission was significant for nitrites, leukocytes, and many bacteria but the patient remained asymptomatic throughout his hospital course. He received 2g rocephin via IV one time but this was discontinued given likely colonization rather than infection.   History of HTN His blood pressure ranged from the 120s-160s/70s-90s during his stay. Given his difficulty with oral medications, we held his home zestril 40 mg daily and recommended he continue it after discharge.   History of BPH Given his difficulty with oral medications, we held his home finasteride 5 mg daily and recommended he continue it after discharge.   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 We do not suspect strangulation or incarceration and recommended that this be followed on an outpatient basis.

## 2020-01-25 NOTE — Evaluation (Signed)
Clinical/Bedside Swallow Evaluation Patient Details  Name: Danny Walker MRN: [id number] Date of Birth: 10/05/1945  Today's Date: 01/25/2020 Time: SLP Start Time (ACUTE ONLY): 79 SLP Stop Time (ACUTE ONLY): 1400 SLP Time Calculation (min) (ACUTE ONLY): 40 min  Past Medical History:  Past Medical History:  Diagnosis Date  . BPH (benign prostatic hyperplasia)   . DDD (degenerative disc disease), lumbosacral   . Fatty liver   . Foley catheter in place   . History of gallstones   . History of prostatitis   . Hypertension   . IDA (iron deficiency anemia)   . Liver mass 09/17/2001   4.7cm , noted on [place] abd  . Low back pain   . Nocturia   . 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 Spondylosis Lumbar  . Umbilical hernia   . Urinary retention 02/08/2018   Past Surgical History:  Past Surgical History:  Procedure Laterality Date  . ERCP W/ SPHICTEROTOMY  09/17/2001  . INSERTION OF MESH N/A 02/05/2016   Procedure: INSERTION OF MESH, UMBILICAL HERNIA;  Surgeon: Armandina Gemma, MD;  Location: [facility];  Service: General;  Laterality: N/A;  . LAPAROSCOPIC CHOLECYSTECTOMY  09/22/2001  . TRANSURETHRAL RESECTION OF PROSTATE N/A 03/05/2018   Procedure: TRANSURETHRAL RESECTION OF THE PROSTATE (TURP);  Surgeon: Cleon Gustin, MD;  Location: [facility];  Service: Urology;  Laterality: N/A;  . UMBILICAL HERNIA REPAIR N/A 02/05/2016   Procedure: UMBILICAL HERNIA REPAIR WITH MESH PATCH;  Surgeon: Armandina Gemma, MD;  Location: [facility];  Service: General;  Laterality: N/A;   HPI:  Danny Walker is a 74 year old man with PMH of MG, HTN, iron deficiency and anemia, BPH, and fatty liver w/ h/o mass who presented with increased difficulty swallowing for two days. Pt seen in 2019, found to have myasthenia gravis with severe dyspahgia. Progressed from NPO to dys 2/thin over a course of nine days of inpatient treatment. Since that  admission he was back to regualr solids and thin liquids, but 3-4 days ago he started noticing swallowing was more effortful, he was very weak and his vision was fatigued. He had difficulty being admitted to ED in a timely manner.    Assessment / Plan / Recommendation Clinical Impression  Pt demonstrates signs of a moderate oropahrygneal dysphagia, not as severe as seen in 2019 with his initial MG diagnosis. Pt is dysarthic, but intelligible, noted to have lingual, palatal and labial weakness, but still able to achieve labial seal and orally transit boluses. He reports difficulty initiating a swallow with his saliva, but when taking a larger, more fluid bolus, he is consistently able to initiate swallowing, though muliple swallows are noted across textures. No signs of aspiration seen at this time. Greatest risk is pharyngeal residue with solids. Recommend pt initiate a full liquid diet at this time as long as function remains stable. RT reports good VC and NIF at this time. If these deteriorate, pt should be made NPO. Discussed at length, in [place] with pt the pt. Will f/u in 1-2 days.   SLP Visit Diagnosis: Dysphagia, oropharyngeal phase (R13.12)    Aspiration Risk  Mild aspiration risk;Moderate aspiration risk    Diet Recommendation Thin liquid   Liquid Administration via: Cup;Straw Medication Administration: Crushed with puree Supervision: Patient able to self feed Compensations: Slow rate;Small sips/bites Postural Changes: Seated upright at 90 degrees    Other  Recommendations Oral Care Recommendations:  Oral care BID Other Recommendations: Have oral suction available   Follow up Recommendations        Frequency and Duration min 2x/week  2 weeks       Prognosis Prognosis for Safe Diet Advancement: Good      Swallow Study   General HPI: Danny Walker is a 74 year old man with PMH of MG, HTN, iron deficiency and anemia, BPH, and fatty liver w/ h/o mass who presented with  increased difficulty swallowing for two days. Pt seen in 2019, found to have myasthenia gravis with severe dyspahgia. Progressed from NPO to dys 2/thin over a course of nine days of inpatient treatment. Since that admission he was back to regualr solids and thin liquids, but 3-4 days ago he started noticing swallowing was more effortful, he was very weak and his vision was fatigued. He had difficulty being admitted to ED in a timely manner.  Type of Study: Bedside Swallow Evaluation Previous Swallow Assessment: 2019  Diet Prior to this Study: NPO Temperature Spikes Noted: No Respiratory Status: Room air History of Recent Intubation: No Behavior/Cognition: Alert;Cooperative;Pleasant mood Oral Cavity Assessment: Within Functional Limits Oral Care Completed by SLP: No Oral Cavity - Dentition: Dentures, top;Dentures, bottom Vision: Functional for self-feeding Self-Feeding Abilities: Able to feed self Patient Positioning: Upright in bed Baseline Vocal Quality: Normal Volitional Cough: Strong Volitional Swallow: Able to elicit    Oral/Motor/Sensory Function Overall Oral Motor/Sensory Function: Moderate impairment Facial ROM: Within Functional Limits Facial Symmetry: Within Functional Limits Facial Strength: Reduced right;Reduced left Facial Sensation: Within Functional Limits Lingual ROM: Within Functional Limits Lingual Symmetry: Within Functional Limits Lingual Strength: Reduced;Suspected CN XII (hypoglossal) dysfunction Lingual Sensation: Within Functional Limits Velum: Impaired right;Impaired left Mandible: Within Functional Limits   Ice Chips Ice chips: Impaired Pharyngeal Phase Impairments: Multiple swallows   Thin Liquid Thin Liquid: Impaired Pharyngeal  Phase Impairments: Multiple swallows    Nectar Thick Nectar Thick Liquid: Not tested   Honey Thick Honey Thick Liquid: Not tested   Puree Puree: Impaired Pharyngeal Phase Impairments: Multiple swallows   Solid     Solid: Not  tested     Herbie Baltimore, MA CCC-SLP  Acute Rehabilitation Services Pager [phone] Office [phone]  Lynann Beaver 01/25/2020,2:15 PM

## 2020-01-25 NOTE — Progress Notes (Signed)
RT NOTE: Patient performed NIF and VC with good effort.  NIF -34 VC 1.10 L

## 2020-01-26 LAB — GLUCOSE, CAPILLARY
Glucose-Capillary: 77 mg/dL (ref 70–99)
Glucose-Capillary: 101 mg/dL — ABNORMAL HIGH (ref 70–99)
Glucose-Capillary: 104 mg/dL — ABNORMAL HIGH (ref 70–99)
Glucose-Capillary: 90 mg/dL (ref 70–99)

## 2020-01-26 NOTE — Procedures (Signed)
NIF -30 cmH2O, VC 1.2 L/M  Patient sitting in chair

## 2020-01-26 NOTE — Progress Notes (Signed)
Pt was working with OT he was transferred to the [facility] while using the bathroom his BP increased to 158/140 after getting back in the chair he BP decreased after 10 min to 121/75. MD was notified no new order was given will continue to monitor.

## 2020-01-26 NOTE — Progress Notes (Signed)
Pt preformed NIF (-24) and VC (1.28L) with good effort.

## 2020-01-26 NOTE — Progress Notes (Signed)
NIF(-35)  and VC (1.14L) obtain from pt with good effort,

## 2020-01-26 NOTE — Progress Notes (Signed)
NIF -30, VC 1.0

## 2020-01-26 NOTE — Evaluation (Signed)
Physical Therapy Evaluation Patient Details Name: Danny Walker MRN: [id number] DOB: Sep 02, 1945 Today's Date: 01/26/2020   History of Present Illness  Patient is a 74 y/o male who presents with weakness, trouble swallowing and difficulty managing secretions. Admitted with myasthenia gravis flare. PMH includes MG, liver mass, HTN, BPH.  Clinical Impression  Patient presents with pain, generalized weakness, difficulty managing secretions/swallowing, impaired balance and impaired mobility s/p above. Pt lives alone, works as a Training and development officer and independent with ambulation/ADLs/IADLs PTA. Today, pt reports feeling weaker than yesterday with more difficulty swallowing. Requires Mod A to bed mobility and Min A for transfers and gait training. Speech noted to be dysarthric due to weakness pt reports. Spoke to daughter in law on phone per pt request. Encouraged walking to bathroom with nursing and OOB to chair 3 times/day. Will follow acutely to maximize independence and mobility prior to return home.    Follow Up Recommendations Home health PT;Supervision for mobility/OOB    Equipment Recommendations  None recommended by PT    Recommendations for Other Services       Precautions / Restrictions Precautions Precautions: Fall Precaution Comments: difficulty managing secretions Restrictions Weight Bearing Restrictions: No      Mobility  Bed Mobility Overal bed mobility: Needs Assistance Bed Mobility: Supine to Sit     Supine to sit: Mod assist;HOB elevated     General bed mobility comments: Able to bring LEs to EOB, assist with trunk and use of rail. Reports feeling dizzy.  Transfers Overall transfer level: Needs assistance Equipment used: None Transfers: Sit to/from Stand Sit to Stand: Min assist         General transfer comment: Assist to boost and steady in standing. transferred to chair post ambulation.  Ambulation/Gait Ambulation/Gait assistance: Min assist Gait Distance (Feet):  32 Feet Assistive device: None Gait Pattern/deviations: Step-through pattern;Decreased stride length Gait velocity: decreased Gait velocity interpretation: <1.31 ft/sec, indicative of household ambulator General Gait Details: Slow, mildly unsteady gait with weakness reported in BLEs; pt attempting to lift UEs high like marching with walking. Sp02 remained >91% on RA  Stairs            Wheelchair Mobility    Modified Rankin (Stroke Patients Only)       Balance Overall balance assessment: Needs assistance Sitting-balance support: Feet supported;No upper extremity supported Sitting balance-Leahy Scale: Good     Standing balance support: During functional activity Standing balance-Leahy Scale: Fair Standing balance comment: Able to stand statically witout UE support; needs Min A at times for dynamic tasks/walking                             Pertinent Vitals/Pain Pain Assessment: Faces Faces Pain Scale: Hurts little more Pain Location: neck and eyes Pain Descriptors / Indicators: Sore;Aching Pain Intervention(s): Monitored during session;Repositioned    Home Living Family/patient expects to be discharged to:: Private residence Living Arrangements: Alone Available Help at Discharge: Family Type of Home: House Home Access: Ramped entrance     Home Layout: One level Home Equipment: None      Prior Function Level of Independence: Independent         Comments: Independent, driving, 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     Hand Dominance   Dominant Hand:  (both)    Extremity/Trunk Assessment   Upper Extremity Assessment Upper Extremity Assessment: Defer to OT evaluation    Lower Extremity Assessment Lower Extremity Assessment: Generalized weakness (but functional)  Cervical / Trunk Assessment Cervical / Trunk Assessment: Normal  Communication   Communication:  (interpreter Massimo [phone])  Cognition Arousal/Alertness: Awake/alert Behavior During  Therapy: WFL for tasks assessed/performed Overall Cognitive Status: No family/caregiver present to determine baseline cognitive functioning                                 General Comments: for basic moblity tasks; Continually asking why he is weaker today than yesterday and why they are not doing anything to help his swallowing.      General Comments General comments (skin integrity, edema, etc.): BP pre activity 163/96, post activity 170/98, dizziness intially which improved with time.    Exercises     Assessment/Plan    PT Assessment Patient needs continued PT services  PT Problem List Decreased strength;Decreased mobility;Pain;Decreased balance       PT Treatment Interventions Therapeutic activities;Gait training;Therapeutic exercise;Patient/family education;Stair training;Balance training;Functional mobility training;Neuromuscular re-education;DME instruction    PT Goals (Current goals can be found in the Care Plan section)  Acute Rehab PT Goals Patient Stated Goal: to be able to swallow and get stronger PT Goal Formulation: With patient Time For Goal Achievement: 02/09/20 Potential to Achieve Goals: Good    Frequency Min 3X/week   Barriers to discharge Decreased caregiver support lives alone    Co-evaluation               AM-PAC PT "6 Clicks" Mobility  Outcome Measure Help needed turning from your back to your side while in a flat bed without using bedrails?: A Little Help needed moving from lying on your back to sitting on the side of a flat bed without using bedrails?: A Lot Help needed moving to and from a bed to a chair (including a wheelchair)?: A Little Help needed standing up from a chair using your arms (e.g., wheelchair or bedside chair)?: A Little Help needed to walk in hospital room?: A Little Help needed climbing 3-5 steps with a railing? : A Lot 6 Click Score: 16    End of Session Equipment Utilized During Treatment: Gait  belt Activity Tolerance: Patient tolerated treatment well Patient left: in chair;with call bell/phone within reach Nurse Communication: Mobility status PT Visit Diagnosis: Pain;Muscle weakness (generalized) (M62.81);Difficulty in walking, not elsewhere classified (R26.2) Pain - part of body:  (neck)    Time: 7588-3254 PT Time Calculation (min) (ACUTE ONLY): 28 min   Charges:   PT Evaluation $PT Eval Moderate Complexity: 1 Mod PT Treatments $Gait Training: 8-22 mins        Marisa Severin, PT, DPT Acute Rehabilitation Services Pager [phone] Office [place] 01/26/2020, 11:52 AM

## 2020-01-26 NOTE — Progress Notes (Signed)
  NIF -35 cm H2O  VC 2.4L/M  Language barrier with instruction however patient has good effort.

## 2020-01-26 NOTE — Progress Notes (Signed)
Occupational Therapy Evaluation  Interpreter Meredith Pel [phone] utilized for translation services.  Patient with functional deficits listed below impacting safety and independence with self care. Patient min A to power up to standing from recliner and min hand held assist x1 to ambulate to bathroom, supervision for clothing/ peri care management. Patient reported dizziness after using bathroom, BP taken once back in recliner reading 158/140 and RN made aware. Patient report some improvement with sitting. Recommend continued acute OT services to maximize patient safety and independence with ADLs and functional transfers.     01/26/20 1500  OT Visit Information  Last OT Received On 01/26/20  Assistance Needed +1  History of Present Illness Patient is a 74 y/o male who presents with weakness, trouble swallowing and difficulty managing secretions. Admitted with myasthenia gravis flare. PMH includes MG, liver mass, HTN, BPH.  Precautions  Precautions Fall  Precaution Comments difficulty managing secretions  Restrictions  Weight Bearing Restrictions No  Home Living  Family/patient expects to be discharged to: Private residence  Living Arrangements Alone  Available Help at Discharge Family  Type of [place] One level  Bathroom Shower/Tub Tub/shower unit  Automotive engineer None  Prior Function  Level of Independence Independent  Comments Independent, driving, 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  Communication  Communication Prefers language other than English  Pain Assessment  Pain Assessment Faces  Faces Pain Scale 0  Cognition  Arousal/Alertness Awake/alert  Behavior During Therapy WFL for tasks assessed/performed  Overall Cognitive Status Within Functional Limits for tasks assessed  General Comments for basic self care tasks  Upper Extremity Assessment  Upper Extremity Assessment Generalized weakness  Lower Extremity Assessment   Lower Extremity Assessment Defer to PT evaluation  Cervical / Trunk Assessment  Cervical / Trunk Assessment Normal  ADL  Overall ADL's  Needs assistance/impaired  Grooming Wash/dry hands;Set up;Sitting  Upper Body Bathing Set up;Sitting  Lower Body Bathing Minimal assistance;Sitting/lateral leans;Sit to/from stand  Upper Body Dressing  Set up;Sitting  Lower Body Dressing Minimal assistance;Sitting/lateral leans;Sit to/from stand  Toilet Transfer Minimal assistance;Cueing for safety;Ambulation;Regular Toilet;Grab bars  Toilet Transfer Details (indicate cue type and reason) hand held assist x1 to ambulate to bathroom for safety  Toileting- Clothing Manipulation and Hygiene Supervision/safety;Sitting/lateral lean;Sit to/from stand  Functional mobility during ADLs Minimal assistance;Cueing for safety (hand held assist)  General ADL Comments patient requiring increased assistance with self care due to weakness, decreased activity tolerance, safety  Bed Mobility  General bed mobility comments in recliner  Transfers  Overall transfer level Needs assistance  Equipment used 1 person hand held assist  Transfers Sit to/from Stand  Sit to Stand Min assist  General transfer comment min A to power up to standing, hand held assist x1 for functional ambulation to/from bathroom  Balance  Overall balance assessment Needs assistance  Sitting-balance support Feet supported;No upper extremity supported  Sitting balance-Leahy Scale Good  Standing balance support Single extremity supported  Standing balance-Leahy Scale Fair  Standing balance comment able to stand statically without UE support, hand held assist with dyamic standing/ambulation  General Comments  General comments (skin integrity, edema, etc.) BP 158/140 once returned to sitting after using bathroom and patient reporting dizziness, RN made aware   OT - End of Session  Equipment Utilized During Treatment Gait belt  Activity Tolerance  Patient tolerated treatment well  Patient left in chair;with call bell/phone within reach  Nurse Communication Mobility status;Other (comment) (BP; dizziness)  OT  Assessment  OT Recommendation/Assessment Patient needs continued OT Services  OT Visit Diagnosis Other abnormalities of gait and mobility (R26.89);Muscle weakness (generalized) (M62.81)  OT Problem List Decreased strength;Decreased activity tolerance;Impaired balance (sitting and/or standing);Decreased safety awareness  OT Plan  OT Frequency (ACUTE ONLY) Min 2X/week  OT Treatment/Interventions (ACUTE ONLY) Self-care/ADL training;Therapeutic exercise;Energy conservation;DME and/or AE instruction;Therapeutic activities;Patient/family education;Balance training  AM-PAC OT "6 Clicks" Daily Activity Outcome Measure (Version 2)  Help from another person eating meals? 4  Help from another person taking care of personal grooming? 3  Help from another person toileting, which includes using toliet, bedpan, or urinal? 3  Help from another person bathing (including washing, rinsing, drying)? 3  Help from another person to put on and taking off regular upper body clothing? 3  Help from another person to put on and taking off regular lower body clothing? 3  6 Click Score 19  OT Recommendation  Follow Up Recommendations Home health OT;Supervision/Assistance - 24 hour (at least initially)  OT Equipment Tub/shower seat  Individuals Consulted  Consulted and Agree with Results and Recommendations Patient  Acute Rehab OT Goals  Patient Stated Goal to be able to swallow and get stronger  OT Goal Formulation With patient  Time For Goal Achievement 02/09/20  Potential to Achieve Goals Good  OT Time Calculation  OT Start Time (ACUTE ONLY) 1410  OT Stop Time (ACUTE ONLY) 1433  OT Time Calculation (min) 23 min  OT General Charges  $OT Visit 1 Visit  OT Evaluation  $OT Eval Low Complexity 1 Low  OT Treatments  $Self Care/Home Management  8-22  mins  Written Expression  Dominant Hand  (did not specify)   Delbert Phenix OT OT pager: [phone]

## 2020-01-26 NOTE — Progress Notes (Addendum)
Pt arrived to floor from ED and oriented to ward and room. Pt is Spanish speaking but understands some [place], Spanish Interpreter machine brought into room.  0151 IV nurse on floor to start IVIG.  0311 IVIG infusion  Complete, pt tolerated procedure. D5W 50 mL flush  infusing @ this time.

## 2020-01-26 NOTE — Progress Notes (Addendum)
NEUROLOGY CONSULTATION PROGESS NOTE   Date of service: January 26, 2020 Patient Name: Danny Walker MRN:  [id number] DOB:  1945/12/22  Interval Hx  Still having trouble swallowing. Nasal tone to voice. Physical Exam   Neurologic Examination  Mental status/Cognition: Alert, oriented to self, place, month and year, good attention. Speech/language: hypophonic with no nasal tone. Fluent, comprehension intact, object naming intact, repetition intact. Cranial nerves:   CN II Pupils equal and reactive to light, no VF deficits   CN III,IV,VI EOM intact, no gaze preference or deviation, no nystagmus, BL lid drooping.   CN V normal sensation in V1, V2, and V3 segments bilaterally   CN VII BL lower facial weakness.   CN VIII normal hearing to speech   CN IX & X Nasal tone to voice. Normal elevation of soft palate, no uvular deviation   CN XI 5/5 head turn and 5/5 shoulder shrug bilaterally   CN XII midline tongue protrusion   Motor:  Muscle bulk: normal, tone normal  Neck Flexion: 3/5 Neck Extension: 5/5  LUE and RUE: 5/5 LLE and RLE: 5/5   Sensation:  Light touch intact   Pin prick    Temperature    Vibration   Proprioception    Labs   Lab Results  Component Value Date   NA 141 01/25/2020   K 4.1 01/25/2020   CL 110 01/25/2020   CO2 24 01/25/2020   GLUCOSE 87 01/25/2020   BUN 21 01/25/2020   CREATININE 1.09 01/25/2020   CALCIUM 8.7 (L) 01/25/2020   ALBUMIN 2.9 (L) 01/25/2020   AST 17 01/25/2020   ALT 23 01/25/2020   ALKPHOS 32 (L) 01/25/2020   BILITOT 0.5 01/25/2020   GFRNONAA >60 01/25/2020   GFRAA >60 01/25/2020     Imaging and Diagnostic studies  Reviewed Chemistry and LFT.  Impression  Danny Walker is a 74 year old male with a history of myasthenia gravis who presents with worsening dysphagia concerning for MG exacerbation. He is strong in his extremities but does seem to have bulbar weakness and weak neck flexion.  NIFs and VC have been waing  and waning but they do appear to be higher than what I would expect just looking at his exam.. Suspecet that they are decreased overnight due to him being asleep but there is documentation from RT that he put in good effort. Will cotninue Q4 H today and can space out to Q6H in the evening if stable.  His dysphagia is still pretty persistent and he does not feel that he is improving. My exam shows persistent neck flexion weakness and hypophonia with nasal tone and no different than yesterday.  Recommendations  - Cont IVIG 400mg /Kg daily x 5 days - Every 4 hours NIFs and VC. Can space out to Q6H if stable - Will recommend clearing for swallow by SLP before PO intake. - continue Prednisone 40mg  daily - Recommend elective intubation if VC falls below 15 to 20 mL/kg and or NIFs falls betlow -20cm/H2O. - Recommend increasing mestinon to 2mg  IV in AM, 1mg  IV in afternoon and 2mg  IV in PM. Or for PO regimen 60 mg PO in AM, 30mg  PO in afternoon, 60mg  PO in PM. - Medications that may worsen or trigger MG exacerbation: Class IA antiarrhythmics, magnesium, flouroquinolones, macrolides, aminoglycosides, penicillamine, curare, interferon alpha, botox, quinine. Use with caution: CCBs, BBs, statins. ______________________________________________________________________   Thank you for the opportunity to take part in the care of this patient. If you have any further  questions, please contact the neurology consultation attending.  Signed,  Constantine Pager Number [phone]

## 2020-01-26 NOTE — Progress Notes (Signed)
HD#2 Subjective:  Overnight Events: no events overnights.    Patient states that he is starting to feel better. He does admit to increased secretion. He denies SHOB.  Objective:  Vital signs in last 24 hours: Vitals:   01/26/20 0338 01/26/20 0341 01/26/20 0445 01/26/20 0700  BP: (!) 154/78  (!) 167/67 (!) 145/72  Pulse: 69  (!) 51 (!) 58  Resp: 17  14 18   Temp: 98 F (36.7 C)  97.6 F (36.4 C) 97.8 F (36.6 C)  TempSrc: Oral  Oral Oral  SpO2: 99%  97% 96%  Weight:  70 kg    Height:       Supplemental O2: Room Air SpO2: 96 %   Physical Exam:  Physical Exam Constitutional:      Appearance: Normal appearance.  HENT:     Head: Normocephalic and atraumatic.     Mouth/Throat:     Comments: Muffled voice Eyes:     Extraocular Movements: Extraocular movements intact.     Right eye: Normal extraocular motion and no nystagmus.     Left eye: Normal extraocular motion and no nystagmus.     Comments: No diplopia with eye EOM  Cardiovascular:     Rate and Rhythm: Normal rate.     Pulses: Normal pulses.     Heart sounds: Normal heart sounds.  Pulmonary:     Effort: Pulmonary effort is normal. No tachypnea, accessory muscle usage or respiratory distress.     Breath sounds: Normal breath sounds.  Abdominal:     General: Bowel sounds are normal.     Palpations: Abdomen is soft.     Tenderness: There is no abdominal tenderness.  Musculoskeletal:        General: Normal range of motion.     Cervical back: Normal range of motion.     Right lower leg: No edema.     Left lower leg: No edema.  Skin:    General: Skin is warm and dry.  Neurological:     Mental Status: He is alert and oriented to person, place, and time. Mental status is at baseline.  Psychiatric:        Mood and Affect: Mood normal.     Filed Weights   01/24/20 1505 01/26/20 0341  Weight: 76.2 kg 70 kg     Intake/Output Summary (Last 24 hours) at 01/26/2020 1455 Last data filed at 01/26/2020 1133 Gross  per 24 hour  Intake --  Output 2200 ml  Net -2200 ml   Net IO Since Admission: -1,700 mL [01/26/20 1455]  Pertinent Labs: CBC Latest Ref Rng & Units 01/25/2020 01/24/2020 01/10/2020  WBC 4.0 - 10.5 K/uL 6.3 8.3 9.0  Hemoglobin 13.0 - 17.0 g/dL 9.9(L) 11.2(L) 11.6(L)  Hematocrit 39 - 52 % 33.6(L) 36.9(L) 39.5  Platelets 150 - 400 K/uL 168 205 357    CMP Latest Ref Rng & Units 01/25/2020 01/24/2020 01/10/2020  Glucose 70 - 99 mg/dL 87 118(H) 124(H)  BUN 8 - 23 mg/dL 21 32(H) 36(H)  Creatinine 0.61 - 1.24 mg/dL 1.09 1.25(H) 1.56(H)  Sodium 135 - 145 mmol/L 141 143 142  Potassium 3.5 - 5.1 mmol/L 4.1 4.4 4.6  Chloride 98 - 111 mmol/L 110 110 108  CO2 22 - 32 mmol/L 24 22 21(L)  Calcium 8.9 - 10.3 mg/dL 8.7(L) 9.2 9.9  Total Protein 6.5 - 8.1 g/dL 5.9(L) 6.2(L) 6.7  Total Bilirubin 0.3 - 1.2 mg/dL 0.5 0.6 0.7  Alkaline Phos 38 - 126 U/L  32(L) 36(L) 44  AST 15 - 41 U/L 17 23 26   ALT 0 - 44 U/L 23 31 28     Pending Labs: none  Imaging: No results found.  Assessment/Plan:   Principal Problem:   Myasthenia gravis with acute exacerbation ([place])   Patient Summary: Danny Walker is a 74 year old man with PMH of MG, HTN, iron deficiency and anemia, BPH, and fatty liver w/ h/o mass who presented with increased difficulty swallowing for two days.    Myasthenia gravis flare Patient improved since admission.  He continues to have muffled speech when he talks for extended periods of time.  He states he is tolerating the medications well although does admit to increased secretions. -Continue IV steroids and IVIG -Continue pyridostigmine -Continue clear liquid diet - Continue NIFs and VC -Continue PT/OT and monitor closely.  History of HTN Blood pressure 120s-140s/70s-90s - NPO at this time, holding zestril 40 mg daily  History of BPH - NPO at this time, holding finasteride 5 mg daily   Diet: clear liquid IVF: None,None VTE: Enoxaparin Code: Full PT/OT recs: Pending, none. TOC  recs: pending   Dispo: Anticipated discharge to Home in 2 days pending further treatment.    Marianna Payment, D.O. [facility], PGY-2 Date 01/26/2020 Time 2:55 PM Pager: [phone] Please contact the on call pager after 5 pm and on weekends at [phone].

## 2020-01-27 LAB — CBC
HCT: 37.4 % — ABNORMAL LOW (ref 39.0–52.0)
Hemoglobin: 11.1 g/dL — ABNORMAL LOW (ref 13.0–17.0)
MCH: 25.9 pg — ABNORMAL LOW (ref 26.0–34.0)
MCHC: 29.7 g/dL — ABNORMAL LOW (ref 30.0–36.0)
MCV: 87.4 fL (ref 80.0–100.0)
Platelets: 210 10*3/uL (ref 150–400)
RBC: 4.28 MIL/uL (ref 4.22–5.81)
RDW: 18.9 % — ABNORMAL HIGH (ref 11.5–15.5)
WBC: 6.8 10*3/uL (ref 4.0–10.5)
nRBC: 0 % (ref 0.0–0.2)

## 2020-01-27 LAB — COMPREHENSIVE METABOLIC PANEL
ALT: 22 U/L (ref 0–44)
AST: 21 U/L (ref 15–41)
Albumin: 2.9 g/dL — ABNORMAL LOW (ref 3.5–5.0)
Alkaline Phosphatase: 37 U/L — ABNORMAL LOW (ref 38–126)
Anion gap: 9 (ref 5–15)
BUN: 22 mg/dL (ref 8–23)
CO2: 25 mmol/L (ref 22–32)
Calcium: 8.7 mg/dL — ABNORMAL LOW (ref 8.9–10.3)
Chloride: 103 mmol/L (ref 98–111)
Creatinine, Ser: 1.13 mg/dL (ref 0.61–1.24)
GFR calc Af Amer: >60 mL/min (ref >60)
GFR calc non Af Amer: >60 mL/min (ref >60)
Glucose, Bld: 85 mg/dL (ref 70–99)
Potassium: 3.8 mmol/L (ref 3.5–5.1)
Sodium: 137 mmol/L (ref 135–145)
Total Bilirubin: 0.6 mg/dL (ref 0.3–1.2)
Total Protein: 7.5 g/dL (ref 6.5–8.1)

## 2020-01-27 LAB — GLUCOSE, CAPILLARY
Glucose-Capillary: 76 mg/dL (ref 70–99)
Glucose-Capillary: 129 mg/dL — ABNORMAL HIGH (ref 70–99)
Glucose-Capillary: 121 mg/dL — ABNORMAL HIGH (ref 70–99)
Glucose-Capillary: 88 mg/dL (ref 70–99)

## 2020-01-27 MED ORDER — METHYLPREDNISOLONE SODIUM SUCC 125 MG IJ SOLR
50.0000 mg | Freq: Every day | INTRAMUSCULAR | Status: DC
  Administered 2020-01-28 – 2020-01-29 (×2): 50 mg via INTRAVENOUS
  Filled 2020-01-27 (×2): qty 2

## 2020-01-27 MED ORDER — PYRIDOSTIGMINE BROMIDE 10 MG/2ML IV SOLN
2.0000 mg | Freq: Every morning | INTRAVENOUS | Status: DC
  Administered 2020-01-28 – 2020-01-29 (×2): 2 mg via INTRAVENOUS
  Filled 2020-01-27 (×2): qty 0.4

## 2020-01-27 MED ORDER — PYRIDOSTIGMINE BROMIDE 10 MG/2ML IV SOLN
1.0000 mg | Freq: Every day | INTRAVENOUS | Status: DC
  Administered 2020-01-27 – 2020-01-28 (×2): 1 mg via INTRAVENOUS
  Filled 2020-01-27 (×3): qty 0.2

## 2020-01-27 MED ORDER — PYRIDOSTIGMINE BROMIDE 10 MG/2ML IV SOLN
2.0000 mg | Freq: Every day | INTRAVENOUS | Status: DC
  Administered 2020-01-27 – 2020-01-28 (×2): 2 mg via INTRAVENOUS
  Filled 2020-01-27 (×3): qty 0.4

## 2020-01-27 NOTE — Progress Notes (Signed)
Nif>-35 VC 0.7

## 2020-01-27 NOTE — Progress Notes (Signed)
Nif > -40 VC 1/2 Liter

## 2020-01-27 NOTE — Progress Notes (Signed)
NIF -26 VC 1.8L

## 2020-01-27 NOTE — Progress Notes (Signed)
Language barrier exists. Fortunately the RN speaks spanish. Pt effort and language barrier makes it extremely difficult to get accurate NIF & VC.  NIF approx -20 and VC approx 1L

## 2020-01-27 NOTE — Progress Notes (Signed)
HD#3 Subjective:  Overnight Events: Patient states that he had an episode of SHOB. During this time, he became very anxious and felt as though his throat was closing. He was concerned because he has had a difficult time communicating with the nursing staff. He admits to being afraid that he wont be able to breath and effectively communicated that he needs help.   Patient states that he is not sure of his progress and feels like his experience overnight has set him back. He continues to have a difficult time managing his secretions, particularly at night.   Objective:  Vital signs in last 24 hours: Vitals:   01/27/20 0225 01/27/20 0327 01/27/20 0755 01/27/20 1221  BP: (!) 164/80 (!) 145/85 (!) 141/86 (!) 146/77  Pulse: 65 63 81 95  Resp: (!) 21 16 15 20   Temp: 97.6 F (36.4 C) 97.7 F (36.5 C) (!) 97.5 F (36.4 C) 98 F (36.7 C)  TempSrc: Oral Oral Oral Oral  SpO2: 99% 94% 95% (!) 79%  Weight:  69.6 kg    Height:       Supplemental O2: Room Air SpO2: (!) 79 %   Physical Exam:  Physical Exam Constitutional:      Appearance: Normal appearance.  HENT:     Head: Normocephalic and atraumatic.  Eyes:     Extraocular Movements: Extraocular movements intact.     Pupils: Pupils are equal, round, and reactive to light.  Cardiovascular:     Rate and Rhythm: Normal rate.     Pulses: Normal pulses.     Heart sounds: Normal heart sounds.  Pulmonary:     Effort: Pulmonary effort is normal.     Breath sounds: Normal breath sounds.  Abdominal:     General: Bowel sounds are normal.     Palpations: Abdomen is soft.     Tenderness: There is no abdominal tenderness.  Musculoskeletal:        General: Normal range of motion.     Cervical back: Normal range of motion.     Right lower leg: No edema.     Left lower leg: No edema.  Skin:    General: Skin is warm and dry.  Neurological:     Mental Status: He is alert and oriented to person, place, and time. Mental status is at  baseline.     Comments: Muffled voice, increased secretions. Normal extremity strength. Does have soreness behind his neck.   Psychiatric:        Mood and Affect: Mood normal.     Filed Weights   01/24/20 1505 01/26/20 0341 01/27/20 0327  Weight: 76.2 kg 70 kg 69.6 kg     Intake/Output Summary (Last 24 hours) at 01/27/2020 1238 Last data filed at 01/27/2020 0300 Gross per 24 hour  Intake 900 ml  Output 700 ml  Net 200 ml   Net IO Since Admission: -1,500 mL [01/27/20 1238]  Pertinent Labs: CBC Latest Ref Rng & Units 01/27/2020 01/25/2020 01/24/2020  WBC 4.0 - 10.5 K/uL 6.8 6.3 8.3  Hemoglobin 13.0 - 17.0 g/dL 11.1(L) 9.9(L) 11.2(L)  Hematocrit 39 - 52 % 37.4(L) 33.6(L) 36.9(L)  Platelets 150 - 400 K/uL 210 168 205    CMP Latest Ref Rng & Units 01/27/2020 01/25/2020 01/24/2020  Glucose 70 - 99 mg/dL 85 87 118(H)  BUN 8 - 23 mg/dL 22 21 32(H)  Creatinine 0.61 - 1.24 mg/dL 1.13 1.09 1.25(H)  Sodium 135 - 145 mmol/L 137 141 143  Potassium 3.5 -  5.1 mmol/L 3.8 4.1 4.4  Chloride 98 - 111 mmol/L 103 110 110  CO2 22 - 32 mmol/L 25 24 22   Calcium 8.9 - 10.3 mg/dL 8.7(L) 8.7(L) 9.2  Total Protein 6.5 - 8.1 g/dL 7.5 5.9(L) 6.2(L)  Total Bilirubin 0.3 - 1.2 mg/dL 0.6 0.5 0.6  Alkaline Phos 38 - 126 U/L 37(L) 32(L) 36(L)  AST 15 - 41 U/L 21 17 23   ALT 0 - 44 U/L 22 23 31     Pending Labs: none  Imaging: No results found.  Assessment/Plan:   Principal Problem:   Myasthenia gravis with acute exacerbation ([place])   Patient Summary: [name] is a 74 year old man with PMH of MG, HTN, iron deficiency and anemia, BPH,and fatty liver w/ h/o mass who presented with increased difficulty swallowing for two days  He is on hospital day 3 and improving.  Myasthenia gravis flare Patient continues to improve. States that he is having a difficult time managing his secretions overnight. Neurology recommends adjusting his medications to make this more managable. -Continue IV steroids increased to  50 mg daily - Continue 5 day course of IVIG, consider Plasma exchange if he does not improve. -Continue pyridostigmine 2 mg in AM, 2 mg afternoon, and 1 mg at night. -Continue NIFs and VC q 6 hours -Continue PT/OT and monitor closely.  History of HTN Blood pressure 140's/70's - NPO at this time, holding zestril 40 mg daily  History of BPH - NPO at this time, holding finasteride 5 mg daily   Diet: clear liquid IVF: None,None VTE: Enoxaparin Code: Full PT/OT recs: Pending, none. TOC recs: pending   Dispo: Anticipated discharge to Home in 2-3 days pending improvement.    Marianna Payment, D.O. [facility], PGY-1 Date 01/27/2020 Time 12:38 PM Pager: [phone] Please contact the on call pager after 5 pm and on weekends at [phone].

## 2020-01-27 NOTE — Progress Notes (Signed)
Patient sleeping. NIF , VC not done at this time.

## 2020-01-27 NOTE — Progress Notes (Addendum)
VC 0.8 Nif> Pt. Unable to complete nif due to dentures. I had called a Patent attorney to help. Pt. Stated that his lips are numb due to a medication given by a Therapist, sports. Pt. Unable to complete nif and asked for me to come back later when he is no longer frustrated and upset. Pt. Sating 96% on RM air and comfortable. RT will continue to monitor.

## 2020-01-27 NOTE — Progress Notes (Signed)
  Speech Language Pathology Treatment: Dysphagia  Patient Details Name: Danny Walker MRN: [id number] DOB: [birth year] Today's Date: 01/27/2020 Time: 1200-1220 SLP Time Calculation (min) (ACUTE ONLY): 20 min  Assessment / Plan / Recommendation Clinical Impression  Patient seen to address dysphagia goals with current diet of full liquids. Patient stated that this morning, his throat seemed to close up and he wasn't able to talk or swallow anything. After eventually taking some small sips of warm liquids, he reported that he was then able to swallow and talk without difficulty. During today's session, patient's RR and SpO2 numbers were in normal range and no fluctuations noted. Patient's voice was clear and strong. He did not exhibit any overt s/s of aspiration or penetration with thin liquids sips and laryngeal elevation and pharyngeal contraction were both adequate for swallow function. Patient said he was not able to eat any PO's besides thin liquid consistency and that when he tried earlier this morning, he spit it all back up. Secondary to patient's complaints of a little worsening of his overall swallowing, will recommend to continue on liquids (full liquids).      HPI HPI: Danny Walker is a 74 year old man with PMH of MG, HTN, iron deficiency and anemia, BPH, and fatty liver w/ h/o mass who presented with increased difficulty swallowing for two days. Pt seen in 2019, found to have myasthenia gravis with severe dyspahgia. Progressed from NPO to dys 2/thin over a course of nine days of inpatient treatment. Since that admission he was back to regualr solids and thin liquids, but 3-4 days ago he started noticing swallowing was more effortful, he was very weak and his vision was fatigued. He had difficulty being admitted to ED in a timely manner.       SLP Plan  Continue with current plan of care       Recommendations  Diet recommendations: Thin liquid Liquids provided via:  Cup;Straw Medication Administration: Crushed with puree Supervision: Patient able to self feed Compensations: Slow rate;Small sips/bites                Oral Care Recommendations: Oral care BID Follow up Recommendations: None SLP Visit Diagnosis: Dysphagia, oropharyngeal phase (R13.12) Plan: Continue with current plan of care       GO               Sonia Baller, MA, [place] [facility] [place] [facility] Pager: [phone]

## 2020-01-27 NOTE — Progress Notes (Signed)
Neurology Progress Note   S:// Seen and examined. Reported that yesterday was fine during the day but as the night progressed, started feeling as if his throat was closing and he had difficulty swallowing and breathing.  Discontinued during the night and up until the early hours of the morning and then improved. Now, feels much improved and relief. Daughter at bedside expressed concerns that he gets anxious overnight when he is unable to communicate as he is Spanish-speaking only and the interpreter machine does not work at all times making it hard for him to communicate to the nighttime care team.    O:// Current vital signs: BP (!) 141/86 (BP Location: Right Arm)   Pulse 81   Temp (!) 97.5 F (36.4 C) (Oral)   Resp 15   Ht 5\' 7"  (1.702 m)   Wt 69.6 kg   SpO2 95%   BMI 24.03 kg/m  Vital signs in last 24 hours: Temp:  [97.5 F (36.4 C)-98.7 F (37.1 C)] 97.5 F (36.4 C) (09/06 0755) Pulse Rate:  [56-81] 81 (09/06 0755) Resp:  [14-24] 15 (09/06 0755) BP: (131-164)/(74-92) 141/86 (09/06 0755) SpO2:  [93 %-99 %] 95 % (09/06 0755) Weight:  [69.6 kg] 69.6 kg (09/06 0327) General: Awake alert slightly anxious appearing HEENT: Neuropsych atraumatic Lungs: Clear Neurological exam Awake alert oriented x3-speak Spanish, interpretation provided by the sister over the speaker phone. Voice sounds hypophonic. No evidence of aphasia No gross dysarthria Cranial nerves: Pupils equal round react light, extraocular movements intact, visual fields full, face appears symmetric but symmetrically weak, normal tongue and palate, no deviation of the uvula. Neck flexors 3-4/5, neck extensors 5/5. Motor exam: 5/5 without drift Sensory: Light touch intact Coordination: No dysmetria  Medications  Current Facility-Administered Medications:  .  acetaminophen (TYLENOL) tablet 650 mg, 650 mg, Oral, Q6H PRN **OR** acetaminophen (TYLENOL) suppository 650 mg, 650 mg, Rectal, Q6H PRN, Aslam, Sadia,  MD .  acetaminophen (TYLENOL) tablet 650 mg, 650 mg, Oral, Q24H, Greta Doom, MD, 650 mg at 01/26/20 2157 .  enoxaparin (LOVENOX) injection 40 mg, 40 mg, Subcutaneous, Q24H, Aslam, Sadia, MD, 40 mg at 01/26/20 2158 .  Immune Globulin 10% (PRIVIGEN) IV infusion 30 g, 400 mg/kg, Intravenous, Q24 Hr x 5, Greta Doom, MD, Last Rate: 22 mL/hr at 01/26/20 0153, 30 g at 01/26/20 2350 .  insulin aspart (novoLOG) injection 0-15 Units, 0-15 Units, Subcutaneous, TID WC, Aslam, Sadia, MD .  methylPREDNISolone sodium succinate (SOLU-MEDROL) 40 mg/mL injection 16 mg, 16 mg, Intravenous, Daily, Marianna Payment, MD, 16 mg at 01/26/20 0914 .  pantoprazole (PROTONIX) injection 40 mg, 40 mg, Intravenous, QHS, Aslam, Sadia, MD, 40 mg at 01/26/20 2157 .  pyridostigmine (MESTINON) injection 1 mg, 1 mg, Intravenous, TID, Marianna Payment, MD, 1 mg at 01/26/20 2156 Labs CBC    Component Value Date/Time   WBC 6.8 01/27/2020 0748   RBC 4.28 01/27/2020 0748   HGB 11.1 (L) 01/27/2020 0748   HCT 37.4 (L) 01/27/2020 0748   PLT 210 01/27/2020 0748   MCV 87.4 01/27/2020 0748   MCV 97.1 (A) 01/01/2013 1050   MCH 25.9 (L) 01/27/2020 0748   MCHC 29.7 (L) 01/27/2020 0748   RDW 18.9 (H) 01/27/2020 0748   LYMPHSABS 0.6 (L) 01/24/2020 1534   MONOABS 0.4 01/24/2020 1534   EOSABS 0.0 01/24/2020 1534   BASOSABS 0.0 01/24/2020 1534    CMP     Component Value Date/Time   NA 141 01/25/2020 0316   NA 142 04/15/2019 1403  K 4.1 01/25/2020 0316   CL 110 01/25/2020 0316   CO2 24 01/25/2020 0316   GLUCOSE 87 01/25/2020 0316   BUN 21 01/25/2020 0316   BUN 22 04/15/2019 1403   CREATININE 1.09 01/25/2020 0316   CREATININE 0.77 01/01/2013 1045   CALCIUM 8.7 (L) 01/25/2020 0316   PROT 5.9 (L) 01/25/2020 0316   ALBUMIN 2.9 (L) 01/25/2020 0316   AST 17 01/25/2020 0316   ALT 23 01/25/2020 0316   ALKPHOS 32 (L) 01/25/2020 0316   BILITOT 0.5 01/25/2020 0316   GFRNONAA >60 01/25/2020 0316   GFRAA >60  01/25/2020 0316   Imaging I have reviewed images in epic and the results pertinent to this consultation are: CT-scan of the brain MRI examination of the brain  Assessment: 74 year old man with history of seropositive myasthenia gravis presenting with worsening dysphagia concerning for exacerbation of his gravis. Started on IVIG. Reports worsening dysphagia and some dyspnea at night. Is able to maintain NIF andFVC is at the range that do not require elective intubation. Family with concerns that he feels anxious at night due to not being able to communicate due to the language barrier-he is Spanish-speaking only. When I saw him earlier this morning, his speech was much more guttural but improved when I went to see him again after his daughter arrived and I had a bedside discussion with her. I would still complete the whole course of IVIG but I would increase the steroids as below.  I would also make some changes to Mestinon as below  Impression: Myasthenia gravis exacerbation  Recommendations: -He is currently getting 40 mg methylprednisolone IV which is equivalent to 50 mg of prednisone.  I would increase this to 50 mg methylprednisone IV, Which is roughly equivalent of 60 mg of prednisone p.o. -Change his Mestinon from 1 mg IV 3 times daily (equivalent of 30 mg p.o. 3 times daily) to 2 mg in the morning, 2 mg in the afternoon and 1 mg at night-if doing IV or 60 mg in the morning, 60 mg in the afternoon and 30 mg at bedtime-if able to do p.o.  The idea is to reduce the side effects (increased secretions) at night so that he does not feel that his throat is closing at night. -NIF and FVC every 6 hours -Complete 5 days of IVIG -If he does not show improvement with the increase steroids, change Mestinon and 5 days of IVIG, will then have to consider plasma exchange. -I will also touch base with his outpatient neurologist tomorrow for any further recommendations. -- Amie Portland, MD [facility] Pager: [phone] If 7pm to 7am, please call on call as listed on AMION.

## 2020-01-27 NOTE — TOC Initial Note (Signed)
Transition of Care [facility]) - Initial/Assessment Note    Patient Details  Name: Danny Walker MRN: [id number] Date of Birth: 1945-09-10  [facility] of Care [facility]) CM/SW Contact:    Joanne Chars, LCSW Phone Number: 01/27/2020, 12:22 PM  Clinical Narrative:  CSW spoke with pt by means of phone interepretor.  CSW discussed discharge recommendations for [facility], however, pt declines this and says that he does not want any follow up services.  Pt reports he lives alone, but his daughter is nearby and he will be fine without follow up.  CSW asked about equipment: pt reports he has both a walker and a cane, but he has not been using either,  and does not believe he needs any additional equipment.     Permission given to speak with daughter [name].            Expected Discharge Plan: [place] Barriers to Discharge: Continued Medical Work up   Patient Goals and CMS Choice Patient states their goals for this hospitalization and ongoing recovery are:: be 100%      Expected Discharge Plan and Services Expected Discharge Plan: [place]       Living arrangements for the past 2 months: Single Family Home                                      Prior Living Arrangements/Services Living arrangements for the past 2 months: Single Family Home Lives with:: Self Patient language and need for interpreter reviewed:: Yes ([employer] interpret[employer] used: Judson Roch, [id number]) Do you feel safe going back to the place where you live?: Yes      Need for Family Participation in Patient Care: No (Comment) Care giver support system in place?: Yes (comment)   Criminal Activity/Legal Involvement Pertinent to Current Situation/Hospitalization: No - Comment as needed  Activities of Daily Living      Permission Sought/Granted Permission sought to share information with : Family Supports Permission granted to share information with : Yes, Verbal Permission Granted  Share  Information with NAME: [name], daughter           Emotional Assessment Appearance::  (UTA, assessment completed remotely) Attitude/Demeanor/Rapport: Engaged Affect (typically observed): Pleasant Orientation: : Oriented to Self, Oriented to Place, Oriented to  Time, Oriented to Situation Alcohol / Substance Use: Not Applicable Psych Involvement: No (comment)  Admission diagnosis:  Myasthenia gravis with exacerbation ([place]) [G70.01] Myasthenia gravis in crisis [facility]) [G70.01] Patient Active Problem List   Diagnosis Date Noted  . Myasthenia gravis with acute exacerbation ([place]) 01/24/2020  . Hip pain 09/30/2019  . Right shoulder pain 08/20/2019  . Health care maintenance 12/25/2018  . Gastroesophageal reflux disease 04/18/2018  . Essential hypertension 04/18/2018  . Protein-calorie malnutrition, severe 04/06/2018  . Myasthenia gravis, bulbar ([place]) 04/06/2018  . Bulbar weakness ([place]) 04/03/2018  . Unintentional weight loss 04/03/2018  . BPH (benign prostatic hyperplasia) 03/05/2018  . Umbilical hernia 43/15/4008   PCP:  [name], MD Pharmacy:   [facility] [place], [place] - [address] [place] [address] Phone: [phone] Fax: [phone]     Social Determinants of Health (SDOH) Interventions    Readmission Risk Interventions No flowsheet data found.

## 2020-01-27 NOTE — Progress Notes (Addendum)
IVIG started by IV team nurse at this time, will cont to monitor.  0225 IVIG infusion complete, D5W 81mL infusing at this time, pt tolerated infusion with no untoward effects, will cont to monitor.

## 2020-01-28 ENCOUNTER — Encounter (HOSPITAL_COMMUNITY): Payer: Self-pay | Admitting: Internal Medicine

## 2020-01-28 LAB — CBC
HCT: 35.6 % — ABNORMAL LOW (ref 39.0–52.0)
Hemoglobin: 10.9 g/dL — ABNORMAL LOW (ref 13.0–17.0)
MCH: 26.7 pg (ref 26.0–34.0)
MCHC: 30.6 g/dL (ref 30.0–36.0)
MCV: 87.3 fL (ref 80.0–100.0)
Platelets: 196 10*3/uL (ref 150–400)
RBC: 4.08 MIL/uL — ABNORMAL LOW (ref 4.22–5.81)
RDW: 18.9 % — ABNORMAL HIGH (ref 11.5–15.5)
WBC: 6.7 10*3/uL (ref 4.0–10.5)
nRBC: 0 % (ref 0.0–0.2)

## 2020-01-28 LAB — GLUCOSE, CAPILLARY
Glucose-Capillary: 72 mg/dL (ref 70–99)
Glucose-Capillary: 107 mg/dL — ABNORMAL HIGH (ref 70–99)
Glucose-Capillary: 126 mg/dL — ABNORMAL HIGH (ref 70–99)
Glucose-Capillary: 115 mg/dL — ABNORMAL HIGH (ref 70–99)

## 2020-01-28 NOTE — Progress Notes (Signed)
NIF -35, VC 1.4L with good patient effort.

## 2020-01-28 NOTE — Progress Notes (Addendum)
NEUROLOGY PROGRESS NOTE  Subjective: -Still states that he feels as though he is choking at night. -Feels as though he is improving -Apparently he did not understand myasthenia gravis, so with interpreter the disease was explained to him and he understood.  Exam: Vitals:   01/28/20 0000 01/28/20 0738  BP: (!) 160/80 (!) 156/80  Pulse: (!) 58 71  Resp: 20 18  Temp: 97.6 F (36.4 C) 97.6 F (36.4 C)  SpO2: 95% 96%   Neuro:  Mental Status: Alert, oriented, thought content appropriate.  Speech fluent without evidence of aphasia.  Able to follow 3 step commands without difficulty. Cranial Nerves: II:  Visual fields grossly normal,  III,IV, VI: ptosis not present, extra-ocular motions intact bilaterally pupils equal, round, reactive to light and accommodation V,VII: smile symmetric, facial light touch sensation normal bilaterally VIII: hearing normal bilaterally XI: bilateral shoulder shrug XII: midline tongue extension Motor: Right : Upper extremity   5/5    Left:     Upper extremity   5/5  Lower extremity   5/5     Lower extremity   5/5 -Jaw jaw strength is 5/ 5, lateral neck flexion is 5/5, neck extension is 5/5 with neck flexion 4, 4+/5 Tone and bulk:normal tone throughout; no atrophy noted Sensory: Pinprick and light touch intact throughout, bilaterally Deep Tendon Reflexes: 2+ and symmetric throughout Cerebellar: normal finger-to-nose,      Medications:  Scheduled: . acetaminophen  650 mg Oral Q24H  . enoxaparin (LOVENOX) injection  40 mg Subcutaneous Q24H  . insulin aspart  0-15 Units Subcutaneous TID WC  . methylPREDNISolone (SOLU-MEDROL) injection  50 mg Intravenous Daily  . pantoprazole (PROTONIX) IV  40 mg Intravenous QHS  . pyridostigmine  1 mg Intravenous QHS  . pyridostigmine  2 mg Intravenous q AM  . pyridostigmine  2 mg Intravenous Q1200   Continuous: . Immune Globulin 10% 182.88 mL/hr at 01/27/20 2203    Pertinent Labs/Diagnostics: NIF -40, VC 1.3L  with very good patient effort.   Etta Quill PA-C [facility] [phone]  Assessment:  74 year old man with history of seropositive myasthenia gravis presenting with worsening dysphagia concerning for exacerbation of his gravis. Started on IVIG-completing day 5 today. Reports improvement with dysphagia and some dyspnea at night along with sensation as though his throat is closing. Is able to maintain NIF andFVC is at the range that do not require elective intubation. Family with concerns that he feels anxious at night due to not being able to communicate due to the language barrier-he is Spanish-speaking only.  Compared to last night-feels better this morning  Impression:-Myasthenia gravis exacerbation improving with IVIG  Recommendations: Continue with current Mestinon regimen Continue with current steroid regimen Today would be day 5 of IVIG-complete 5 days. Will reassess tomorrow and discussed with the outpatient neurologist whether there is any need for plasma exchange. Continue NIF and FVC every 6 hours.  Plan discussed with the patient and daughter at bedside.   01/28/2020, 9:55 AM  Attending Neurohospitalist Addendum Patient seen and examined with APP/Resident. Agree with the history and physical as documented above. Agree with the plan as documented, which I helped formulate. I have independently reviewed the chart, obtained history, review of systems and examined the patient.I have personally reviewed pertinent head/neck/spine imaging (CT/MRI). Please feel free to call with any questions. --- Amie Portland, MD [facility] Pager: [phone]  If 7pm to 7am, please call on call as listed on AMION.

## 2020-01-28 NOTE — Progress Notes (Signed)
This was completed and sent to [facility] at [facility].

## 2020-01-28 NOTE — Progress Notes (Signed)
Occupational Therapy Treatment Patient Details Name: Danny Walker MRN: [id number] DOB: 06-25-1945 Today's Date: 01/28/2020    History of present illness Patient is a 74 y/o male who presents with weakness, trouble swallowing and difficulty managing secretions. Admitted with myasthenia gravis flare. PMH includes MG, liver mass, HTN, BPH.   OT comments  Status interpreter used during session. Pt making excellent progress functionally. Able to complete ADL @ sink leve, including retrieving his clothes with S. No LOB noted. Educated on compensatory strategies to reduce risk of falls and conserve energy. Pt verbalized understanding. Recommend initial 24/7 S. Pt states his daughter will be able to provided this level of supervision.   Follow Up Recommendations  No OT follow up;Supervision/Assistance - 24 hour (initially)    Equipment Recommendations  3 in 1 bedside commode (to use as shower chair)    Recommendations for Other Services      Precautions / Restrictions Precautions Precautions: Fall       Mobility Bed Mobility Overal bed mobility: Modified Independent                Transfers Overall transfer level: Needs assistance   Transfers: Sit to/from Stand;Stand Pivot Transfers Sit to Stand: Supervision Stand pivot transfers: Supervision            Balance Overall balance assessment: Needs assistance   Sitting balance-Leahy Scale: Good       Standing balance-Leahy Scale: Good                             ADL either performed or assessed with clinical judgement   ADL Overall ADL's : Needs assistance/impaired   Eating/Feeding Details (indicate cue type and reason): pt reports difficulty swallowing Grooming: Set up;Standing   Upper Body Bathing: Set up;Standing   Lower Body Bathing: Set up;Supervison/ safety;Sit to/from stand   Upper Body Dressing : Set up;Sitting   Lower Body Dressing: Set up;Sit to/from stand;Supervision/safety    Toilet Transfer: Supervision/safety;Ambulation   Toileting- Clothing Manipulation and Hygiene: Modified independent   Tub/ Shower Transfer: Supervision/safety;Tub transfer   Functional mobility during ADLs: Supervision/safety General ADL Comments: Educated on compensatory strategies to reduce risk of falls during ADL; Educate don energy conservation strateiges     Vision   Additional Comments: reports he has a "little blurry vision"; has new glasses he needs to pick up   Perception     Praxis      Cognition Arousal/Alertness: Awake/alert Behavior During Therapy: WFL for tasks assessed/performed Overall Cognitive Status: Within Functional Limits for tasks assessed                                          Exercises     Shoulder Instructions       General Comments      Pertinent Vitals/ Pain       Pain Assessment: No/denies pain  Home Living                                          Prior Functioning/Environment              Frequency  Min 2X/week        Progress Toward Goals  OT Goals(current goals can now be found in  the care plan section)  Progress towards OT goals: Progressing toward goals  Acute Rehab OT Goals Patient Stated Goal: to be able to swallow and get stronger OT Goal Formulation: With patient Time For Goal Achievement: 02/09/20 Potential to Achieve Goals: Good ADL Goals Pt Will Perform Grooming: with modified independence;sitting;standing Pt Will Perform Upper Body Dressing: with modified independence;sitting Pt Will Perform Lower Body Dressing: with modified independence;sit to/from stand;sitting/lateral leans Pt Will Transfer to Toilet: with modified independence;ambulating;regular height toilet Pt Will Perform Toileting - Clothing Manipulation and hygiene: with modified independence;sitting/lateral leans;sit to/from stand Pt/caregiver will Perform Home Exercise Program: Increased strength;Both right  and left upper extremity;With theraband;With written HEP provided;Independently  Plan Discharge plan needs to be updated    Co-evaluation                 AM-PAC OT "6 Clicks" Daily Activity     Outcome Measure   Help from another person eating meals?: None Help from another person taking care of personal grooming?: None Help from another person toileting, which includes using toliet, bedpan, or urinal?: A Little Help from another person bathing (including washing, rinsing, drying)?: A Little Help from another person to put on and taking off regular upper body clothing?: A Little Help from another person to put on and taking off regular lower body clothing?: A Little 6 Click Score: 20    End of Session Equipment Utilized During Treatment: Gait belt  OT Visit Diagnosis: Other abnormalities of gait and mobility (R26.89);Muscle weakness (generalized) (M62.81)   Activity Tolerance Patient tolerated treatment well   Patient Left in chair;with call bell/phone within reach   Nurse Communication Mobility status;Other (comment) (DC needs)        Time: 0850-0930 OT Time Calculation (min): 40 min  Charges: OT General Charges $OT Visit: 1 Visit OT Treatments $Self Care/Home Management : 38-52 mins  Maurie Boettcher, OT/L   Acute OT Clinical Specialist Acute Rehabilitation Services Pager [phone] Office [phone]    [facility] 01/28/2020, 11:44 AM

## 2020-01-28 NOTE — Progress Notes (Signed)
Physical Therapy Treatment Patient Details Name: Danny Walker MRN: [id number] DOB: Dec 19, 1945 Today's Date: 01/28/2020    History of Present Illness Patient is a 74 y/o male who presents with weakness, trouble swallowing and difficulty managing secretions. Admitted with myasthenia gravis flare. PMH includes MG, liver mass, HTN, BPH.    PT Comments    Pt demonstrating great progress in assist level and gait distance this day, ambulating hallway distance with supervision level assist only. Pt declines [facility] services, so PT recommending home with assist from family. 24/7 initially will be helpful. Pt reports mild dizziness with mobility, but VSS throughout. Will continue to follow acutely.    Follow Up Recommendations  Supervision/Assistance - 24 hour     Equipment Recommendations  None recommended by PT    Recommendations for Other Services       Precautions / Restrictions Precautions Precautions: Fall Restrictions Weight Bearing Restrictions: No    Mobility  Bed Mobility Overal bed mobility: Modified Independent                Transfers Overall transfer level: Needs assistance Equipment used: None Transfers: Sit to/from Stand Sit to Stand: Supervision Stand pivot transfers: Supervision       General transfer comment: for safety, increased time to rise and steady self but no physical assist  Ambulation/Gait Ambulation/Gait assistance: Supervision Gait Distance (Feet): 170 Feet Assistive device: None Gait Pattern/deviations: Step-through pattern;Decreased stride length;Trunk flexed Gait velocity: decr   General Gait Details: supervision for safety, slow but increasingly steady gait. SpO2 WFL, HR WFL during all mobility   Stairs             Wheelchair Mobility    Modified Rankin (Stroke Patients Only)       Balance Overall balance assessment: Needs assistance   Sitting balance-Leahy Scale: Good       Standing balance-Leahy Scale: Good                               Cognition Arousal/Alertness: Awake/alert Behavior During Therapy: WFL for tasks assessed/performed Overall Cognitive Status: Within Functional Limits for tasks assessed                                 General Comments: Speaks spanish - interpreter Jiles Garter [phone] via ipad      Exercises      General Comments        Pertinent Vitals/Pain Pain Assessment: Faces Faces Pain Scale: Hurts a little bit Pain Location: head Pain Descriptors / Indicators: Other (Comment) (mild dizziness) Pain Intervention(s): Monitored during session    Home Living                      Prior Function            PT Goals (current goals can now be found in the care plan section) Acute Rehab PT Goals Patient Stated Goal: to be able to swallow and get stronger PT Goal Formulation: With patient Time For Goal Achievement: 02/09/20 Potential to Achieve Goals: Good Progress towards PT goals: Progressing toward goals    Frequency    Min 3X/week      PT Plan Discharge plan needs to be updated    Co-evaluation              AM-PAC PT "6 Clicks" Mobility   Outcome Measure  Help  needed turning from your back to your side while in a flat bed without using bedrails?: None Help needed moving from lying on your back to sitting on the side of a flat bed without using bedrails?: None Help needed moving to and from a bed to a chair (including a wheelchair)?: A Little Help needed standing up from a chair using your arms (e.g., wheelchair or bedside chair)?: A Little Help needed to walk in hospital room?: None Help needed climbing 3-5 steps with a railing? : A Little 6 Click Score: 21    End of Session Equipment Utilized During Treatment: Gait belt Activity Tolerance: Patient tolerated treatment well Patient left: with call bell/phone within reach;in bed Nurse Communication: Mobility status PT Visit Diagnosis: Pain;Muscle weakness  (generalized) (M62.81);Difficulty in walking, not elsewhere classified (R26.2) Pain - part of body:  (neck)     Time: 8182-9937 PT Time Calculation (min) (ACUTE ONLY): 10 min  Charges:  $Gait Training: 8-22 mins                     Elianna Windom E, PT Acute Rehabilitation Services Pager [phone]  Office [phone]    Urania Pearlman D Destinee Taber 01/28/2020, 1:00 PM

## 2020-01-28 NOTE — TOC Progression Note (Deleted)
Transition of Care [facility]) - Progression Note    Patient Details  Name: Danny Walker MRN: [id number] Date of Birth: 14-Aug-1945  [facility]) CM/SW Contact  Joanne Chars, LCSW Phone Number: 01/28/2020, 11:54 AM  Clinical Narrative:   CSW received message from pt daughter Danny Walker [phone] stating that the family has decided they don't want to pursue admission at [facility].  CSW then received message that [facility] auth had gone to peer to peer review.  Discussed with Dr Darrick Meigs who believes pt more appropriate for SNF due to trach being removed.  CSW discussed this with Raquel Sarna at [place].  CSW LM for pt daughter Danny Walker as well.    Expected Discharge Plan: [place] Barriers to Discharge: Continued Medical Work up  Expected Discharge Plan and Services Expected Discharge Plan: [place] arrangements for the past 2 months: Single Family Home                                       Social Determinants of Health (SDOH) Interventions    Readmission Risk Interventions No flowsheet data found.

## 2020-01-28 NOTE — Progress Notes (Signed)
Subjective: This morning, [name] feels well. He feels more comfortable with his ability to swallow and speak. He took Tylenol overnight that was crushed and placed in jam, but he felt his throat close up a few minutes later, which he was told was attributable to nerves rather than true dysphagia. He feels like his appendicular strength is improved and that he could feel comfortable going home tomorrow if that is what the team recommends.   He reports no troubles overnight and denies having any lip numbness currently or last night. He was able to ambulate to the bathroom during PT/OT evaluation and got a little dizzy upon standing but did not lose his balance. He denies having urinary retention symptoms.  Objective: Vital signs in last 24 hours: Vitals:   01/27/20 2221 01/27/20 2330 01/28/20 0000 01/28/20 0738  BP: (!) 152/82  (!) 160/80 (!) 156/80  Pulse: 75 62 (!) 58 71  Resp: 17 11 20 18   Temp:   97.6 F (36.4 C) 97.6 F (36.4 C)  TempSrc:   Oral Oral  SpO2: 96% 94% 95% 96%  Weight:      Height:       Weight change:   Intake/Output Summary (Last 24 hours) at 01/28/2020 0911 Last data filed at 01/28/2020 0300 Gross per 24 hour  Intake --  Output 850 ml  Net -850 ml   Physical Exam Constitutional:      Appearance: Normal appearance.  HENT:     Head: Normocephalic and atraumatic.  Eyes:     Extraocular Movements: Extraocular movements intact.     Pupils: Pupils are equal, round, and reactive to light.  Cardiovascular:     Rate and Rhythm: Normal rate.     Pulses: Normal pulses.     Heart sounds: Normal heart sounds.  Pulmonary:     Effort: Pulmonary effort is normal.     Breath sounds: Normal breath sounds.  Abdominal:     General: Bowel sounds are normal.     Palpations: Abdomen is soft.     Tenderness: There is no abdominal tenderness.  Musculoskeletal:        General: Normal range of motion.     Cervical back: Normal range of motion.     Right lower leg: No  edema.     Left lower leg: No edema.  Skin:    General: Skin is warm and dry.  Neurological:     Mental Status: He is alert and oriented to person, place, and time. Mental status is at baseline.     Comments: Slightly muffled voice, improved from yesterday. Normal extremity strength.   Psychiatric:        Mood and Affect: Mood normal. Feeling positively about going home soon.  Assessment/Plan: Principal Problem:   Myasthenia gravis with acute exacerbation ([place])  Danny Walker is a 74 year old man with PMH of MG, HTN, iron deficiency and anemia, BPH,and fatty liver w/ h/o mass who presented with increased difficulty swallowing for two days. He is on hospital day 5 and is improving.  Myasthenia gravis flare Patient continues to improve. Denies having a difficult time managing his secretions overnight. -Continue IV steroids 50 mg daily -Completing 5 day course of IVIG tonight, neuro will consider plasma exchange if he does not improve. -Continue pyridostigmine 2 mg in AM, 2 mg afternoon, and 1 mg at night. -Continue NIFs and VC q 6 hours -Continue PT/OT and monitor closely.  History of HTN Blood pressure 130s-160/80s-90s - Thin liquids  at this time, holding zestril 40 mg daily for now  History of BPH - Thin liquids at this time, holding finasteride 5 mg dailygiven no urinary retention symptoms  Diet: thin liquids IVF: None,None VTE: Enoxaparin Code: Full PT/OT recs: 2-3 times weekly inpatient. TOC recs: pending  Dispo: Anticipated discharge to Home in 1-2 days pending improvement.    LOS: 4 days   Mardella Layman, Medical Student 01/28/2020, 9:11 AM

## 2020-01-28 NOTE — Progress Notes (Signed)
NIF -40, VC 1.3L with very good patient effort.

## 2020-01-28 NOTE — Plan of Care (Signed)

## 2020-01-28 NOTE — Progress Notes (Signed)
NIF -35, VC 1.3L

## 2020-01-28 NOTE — Progress Notes (Signed)
RT note. Pt. Asleep at this time and resting comfortable. RT will come back and do Q4 Nif, VC

## 2020-01-29 LAB — GLUCOSE, CAPILLARY
Glucose-Capillary: 83 mg/dL (ref 70–99)
Glucose-Capillary: 108 mg/dL — ABNORMAL HIGH (ref 70–99)

## 2020-01-29 MED ORDER — PREDNISONE 20 MG PO TABS: 60.0000 mg | ORAL_TABLET | Freq: Every day | ORAL | 0 refills | Status: DC

## 2020-01-29 MED ORDER — PYRIDOSTIGMINE BROMIDE 60 MG PO TABS: ORAL_TABLET | ORAL | 0 refills | Status: DC

## 2020-01-29 NOTE — Progress Notes (Addendum)
NIF (-40) and VC (1.22L) obtain from pt with great effort.

## 2020-01-29 NOTE — TOC Transition Note (Signed)
Transition of Care [facility]) - CM/SW Discharge Note   Patient Details  Name: Danny Walker MRN: [id number] Date of Birth: [birth year]  [facility] of [facility]) CM/SW Contact:  Joanne Chars, LCSW Phone Number: 01/29/2020, 1:05 PM   Clinical Narrative:  PT/OT have released pt with no follow up.  CSW spoke with daughter regarding equipment recommendation of 3 in 1 and pt declines this.  No other needs identified.      Final next level of care: Home/Self Care Barriers to Discharge: Barriers Resolved   Patient Goals and CMS Choice Patient states their goals for this hospitalization and ongoing recovery are:: be 100%      Discharge Placement                       Discharge Plan and Services                DME Arranged:  (Pt declined 3 in 1.)           [place] Agency: NA        Social Determinants of Health (SDOH) Interventions     Readmission Risk Interventions No flowsheet data found.

## 2020-01-29 NOTE — Progress Notes (Signed)
NIF -40 VC 1.0 L 

## 2020-01-29 NOTE — Progress Notes (Signed)
   Subjective: [name] is doing well today and feeling ready to go home. He can swallow saliva without difficulty. He agrees to attend his outpatient PCP and neurologist appointments.  Objective:  Vital signs in last 24 hours: Vitals:   01/28/20 2200 01/29/20 0026 01/29/20 0400 01/29/20 0730  BP: 139/82 (!) 145/83  (!) 167/87  Pulse: 69     Resp: (!) 24     Temp:  97.6 F (36.4 C) 97.9 F (36.6 C) 97.8 F (36.6 C)  TempSrc:  Oral Oral Oral  SpO2: 94%     Weight:      Height:       Weight change:   Intake/Output Summary (Last 24 hours) at 01/29/2020 0750 Last data filed at 01/29/2020 0027 Gross per 24 hour  Intake --  Output 850 ml  Net -850 ml   NIFs last 24 hours -26 to -40, VCs last 24 hours 0.76 L to 1.4L.  Physical Exam Constitutional:  Appearance: Normal appearance.  HENT:  Head: Normocephalicand atraumatic.  Eyes:  Extraocular Movements: Extraocular movements intact.  Pupils: Pupils are equal, round, and reactive to light.  Cardiovascular:  Rate and Rhythm: Normal rate.  Pulses: Normal pulses.  Heart sounds: Normal heart sounds.  Pulmonary:  Effort: Pulmonary effort is normal.  Breath sounds: Normal breath sounds.  Abdominal:  General: Bowel sounds are normal.  Palpations: Abdomen is soft.  Tenderness: There is no abdominal tenderness.  Musculoskeletal:  General: Normal range of motion.  Cervical back: Normal range of motion.  Right lower leg: No edema.  Left lower leg: No edema.  Skin: General: Skin is warmand dry.  Neurological:  Mental Status: He is alertand oriented to person, place, and time. Mental status isat baseline.  Comments: Clear voice, improved from yesterday. Normal extremity strength. Psychiatric:  Mood and Affect: Moodnormal. Feeling positively about going home soon.  Assessment/Plan:  Principal Problem:   Myasthenia gravis with acute exacerbation  ([place]) [name] is a 74 year old man with PMH of MG, HTN, iron deficiency and anemia, BPH,and fatty liver w/ h/o mass who presented with increased difficulty swallowing for two days. He is on hospital day 6 and is improving.  Myasthenia gravis flare with dysphagia Patientcontinues to improve. Denies having a difficult time managing his secretions overnight. - SLP and neurology both evaluated again this AM in anticipation of discharge, both comfortable with sending him home today -Continue steroids 60 mg daily at home -Completed 5 day course of IVIG overnight -Continue pyridostigmine 60 AM and PM, 30 at night -Will not be doing outpatient PT/OT  History of HTN Blood pressure130s-160/80s-90s - Thin liquids at this time, will restart zestril 40 mg at home  History of BPH - Thin liquids at this time, restart finasteride 5 mg dailyat home if having urinary retention symptoms  Diet:thin liquids, advance as tolerated at home ZYS:AYTK ZSW:FUXN Code:Full PT/OT recs:Supervision by family at home [facility] recs:F/u appts scheduled  Dispo: Discharge toHome today.    LOS: 5 days   Mardella Layman, Medical Student 01/29/2020, 7:50 AM

## 2020-01-29 NOTE — Progress Notes (Signed)
  Speech Language Pathology Treatment: Dysphagia  Patient Details Name: Danny Walker MRN: [id number] DOB: February 16, 1946 Today's Date: 01/29/2020 Time: 0737-1062 SLP Time Calculation (min) (ACUTE ONLY): 25 min  Assessment / Plan / Recommendation Clinical Impression  Pt demonstrates ongoing mild dysphagia, still reporting some difficulty swallowing thicker solids. Encouraged basic strategies to initiate swallow, clear residue and increase caloric intake. Pt will have access to higher quality foods at home with ability to prepare thin purees and liquids. Pt has experience with this diet after prior admit. We discussed how to manage muscular fatigue, conserving energy as needed. Pt may continue full liquids diet but is ready to d/c home from a swallowing standpoint. Discussed with MD.   HPI        SLP Plan  Continue with current plan of care       Recommendations  Diet recommendations: Thin liquid Liquids provided via: Cup;Straw Medication Administration: Crushed with puree Supervision: Patient able to self feed Compensations: Slow rate;Small sips/bites                Oral Care Recommendations: Oral care BID Follow up Recommendations: None SLP Visit Diagnosis: Dysphagia, oropharyngeal phase (R13.12) Plan: Continue with current plan of care       GO               Herbie Baltimore, MA [place] Pager [phone] Office [phone]  Lynann Beaver 01/29/2020, 9:57 AM

## 2020-01-29 NOTE — Plan of Care (Signed)

## 2020-01-29 NOTE — Progress Notes (Signed)
Pt performed NIF (-26) and VC (0.76L) with good effort.

## 2020-01-29 NOTE — Progress Notes (Addendum)
NEUROLOGY PROGRESS NOTE  Subjective: Patient feels that he no longer is feeling the choking sensation that he was the night before.  At this point he is able to drink fluids with no difficulty however he did have difficulty with grits.   Speech is seen him today and recommends thin liquids, use of straw, crushed medications and use pure food, along with small sips of water.  Exam: Vitals:   01/29/20 0400 01/29/20 0730  BP:  (!) 167/87  Pulse:    Resp:    Temp: 97.9 F (36.6 C) 97.8 F (36.6 C)  SpO2:      Neuro:  Mental Status: Alert, oriented, thought content appropriate.  Speech fluent without evidence of aphasia.  Able to follow 3 step commands without difficulty. He is not hypophonic and speech does not appear as gurgly today.  He himself reports that the speech is very clear. Cranial Nerves: II:  Visual fields grossly normal,  III,IV, VI: ptosis continues to be slightly present., extra-ocular motions intact bilaterally pupils equal, round, reactive to light and accommodation V,VII: smile symmetric, facial light touch sensation normal bilaterally VIII: hearing normal bilaterally XII: midline tongue extension Motor: Moving all extremities antigravity Neck flexors are 4+/5.  Sensory: Pinprick and light touch intact throughout, bilaterally  Medications:  Scheduled: . acetaminophen  650 mg Oral Q24H  . enoxaparin (LOVENOX) injection  40 mg Subcutaneous Q24H  . insulin aspart  0-15 Units Subcutaneous TID WC  . methylPREDNISolone (SOLU-MEDROL) injection  50 mg Intravenous Daily  . pantoprazole (PROTONIX) IV  40 mg Intravenous QHS  . pyridostigmine  1 mg Intravenous QHS  . pyridostigmine  2 mg Intravenous q AM  . pyridostigmine  2 mg Intravenous Q1200    Danny Quill PA-C [facility] [phone]  Attending addendum Patient seen and examined Spoke with the patient with the daughter translating at bedside. Answered all questions.  Assessment:  This is a  74 year old male presenting to [facility] with myasthenia gravis exacerbation with predominantly bulbar symptoms  Received 5 days of IVIG-exam much improved.   I have spoken with Danny Walker and discussed his case along with my exam and current changes that have been made to his Mestinon as well as prednisone.  We also discussed him completing IVIG 5 days with substantial improvement in his symptoms.  Recommendations: -At this point there is no need for plasma exchange.  Should be able to be discharged from a neurological perspective, if deemed medically appropriate by the primary medical team. -On discharge-continue Mestinon in oral form of 60 mg in the morning, 60 mg the afternoon, and 30 mg at night -Continue 60 mg of prednisone p.o. daily with PPI and also calcium supplement while he is on steroids.  Danny Walker will plan steroid tapering as well as discussed considerations for steroid sparing agents as an outpatient. -Follow-up with Danny Walker his neuromuscular neurology specialist.  Has an appointment scheduled for 02/04/2020-and I have told his daughter to keep that appointment.  Danny Walker is also aware.  Relayed my plan to the medicine team resident physician Danny Walker over the phone.  Please call inpatient neurology with questions as needed.  01/29/2020, 10:31 AM   -- Danny Portland, MD [facility] Pager: [phone] If 7pm to 7am, please call on call as listed on AMION.

## 2020-02-03 ENCOUNTER — Encounter: Payer: Self-pay | Admitting: Internal Medicine

## 2020-02-03 ENCOUNTER — Other Ambulatory Visit: Payer: Self-pay

## 2020-02-03 ENCOUNTER — Ambulatory Visit (INDEPENDENT_AMBULATORY_CARE_PROVIDER_SITE_OTHER): Payer: Medicare Other | Admitting: Internal Medicine

## 2020-02-03 VITALS — BP 147/79 | HR 76 | Temp 98.4°F | Ht 68.0 in | Wt 158.9 lb

## 2020-02-03 DIAGNOSIS — G7001 Myasthenia gravis with (acute) exacerbation: Secondary | ICD-10-CM

## 2020-02-03 DIAGNOSIS — G7000 Myasthenia gravis without (acute) exacerbation: Secondary | ICD-10-CM

## 2020-02-03 NOTE — Assessment & Plan Note (Signed)
Danny Walker is a 74 yo M w/ PMH of myasthenia gravis presenting to [facility] for hospital follow up visit. He was recently hospitalized for 5 days for acute exacerbation of myasthenia gravis involving dysphagia and weakness. He was treated w/ IVIG and high does IV steroids and had significant improvement in his symptoms at discharge. He states since discharge, he has been eating well without any episodes of choking or coughing. He mentions continuing to take his 60mg  prednisone and pyridostigmine as prescribed without difficulty and has had no significant side effects. He requests note for work as well as renewal for his disability parking pass  A/P Myasthenia gravis acute exacerbation appears to have resolved. Has f/u with neurology 02/04/2020 at 8am and discussed importance of going to this appointment. Will get blood work to assess for any sequelae of IVIG therapy.  - F/u with neurology - C/w prednisone 60mg , pyridostigmine 60mg  - CBC, CMP

## 2020-02-03 NOTE — Patient Instructions (Signed)
Thank you for allowing [place] to provide your care today. Today we discussed your recent hospitalization.    I have ordered cbc, cmp labs for you. I will call if any are abnormal.    Today we made no changes to your medications.    Please follow-up with your neurologist tomorrow.    Should you have any questions or concerns please call the internal medicine clinic at [phone].     Miastenia gravis Myasthenia Gravis La miastenia gravis (MG) es una enfermedad prolongada (crnica) que causa debilidad en los msculos que se pueden controlar (msculos de movimiento voluntario). La miastenia gravis puede afectar cualquier msculo de movimiento voluntario. Los msculos afectados con ms frecuencia son aquellos que controlan lo siguiente:  Los movimientos oculares.  Los movimientos faciales.  Tragar. La MG es una enfermedad en la cual el sistema encargado de combatir las enfermedades (sistema inmunitario) ataca a los tejidos sanos del cuerpo (enfermedad autoinmunitaria). Cuando una persona tiene MG, el sistema inmunitario fabrica protenas (anticuerpos) que inhiben la sustancia qumica (acetilcolina) que el organismo necesita para enviar seales nerviosas a los msculos. Esto causa debilidad muscular. Cules son las causas? Se desconoce la causa exacta de la MG. Qu incrementa el riesgo? Los siguientes factores pueden hacer que usted sea ms propenso a Best boy esta afeccin:  Tener el timo Edison. El timo se encuentra debajo del esternn. Este produce determinadas clulas para el sistema inmunitario.  Tener antecedentes familiares de MG. Cules son los signos o los sntomas? Los sntomas de la MG pueden incluir los siguientes:  Cada del prpado.  Visin doble.  Debilidad muscular que empeora con la actividad y mejora despus del descanso.  Dificultad para caminar.  Dificultad para Engineer, manufacturing systems y tragar.  Dificultad para hacer expresiones faciales.  Hablar arrastrando las  palabras.  Debilidad en los brazos, las manos y las piernas. Una dificultad respiratoria repentina y grave (crisis miastnica) puede aparecer despus de tener:  Una infeccin.  Cristy Hilts.  Una reaccin adversa a un medicamento. La crisis miastnica requiere asistencia respiratoria de emergencia. A veces, los sntomas de MG desaparecen por un tiempo (remisin) y luego vuelven a Arts administrator. Cmo se diagnostica? Esta afeccin se puede diagnosticar en funcin de lo siguiente:  Los sntomas y antecedentes mdicos.  Un examen fsico.  Anlisis de sangre.  Estudios de la fuerza y funcin de los msculos.  Pruebas de diagnstico por imgenes, como una exploracin por tomografa computarizada (TC) o una resonancia magntica (RM). Cmo se trata? El objetivo del tratamiento es mejorar la fuerza muscular. El tratamiento puede incluir lo siguiente:  Tomar medicamentos.  Realizar cambios en el estilo de vida que se centran en ahorrar su energa.  Hacer fisioterapia para recuperar la fuerza.  Someterse a una ciruga para extirpar el timo (timectoma). En algunas personas, esto puede derivar en la remisin prolongada.  Someterse a un procedimiento para eliminar los anticuerpos de acetilcolina (plasmafresis).  Recibir asistencia respiratoria de emergencia, si tiene una crisis miastnica. Si tiene una remisin, podr Management consultant tratamiento y luego retomarlo cuando los sntomas vuelvan a Arts administrator. Siga estas indicaciones en su casa:   Tome los medicamentos de venta libre y los recetados solamente como se lo haya indicado el mdico.  Descanse y duerma lo suficiente. Haga pausas frecuentes para descansar los ojos, especialmente cuando est en condiciones de luz brillante o trabajando en una computadora.  Siga una dieta sana y [place] un peso saludable. Si necesita ayuda, colabore con el mdico o con un especialista en alimentacin y nutricin(nutricionista).  Haga los ejercicios como  se lo haya indicado el mdico o el fisioterapeuta.  No consuma ningn producto que contenga nicotina o tabaco, como cigarrillos y Psychologist, sport and exercise. Si necesita ayuda para dejar de fumar, consulte al [employer].  Para prevenir infecciones: ? Lvese las manos frecuentemente con agua y [place]. Use desinfectante para manos si no dispone de [place] y [place]. ? Evite el contacto con otras personas que estn enfermas. ? Evite tocarse los ojos, la nariz y la boca. ? Limpie con un desinfectante las superficies de su casa que se tocan con frecuencia.  Concurra a todas las visitas de seguimiento como se lo haya indicado el mdico. Esto es importante. Comunquese con un mdico si:  Sus sntomas [place] o [place], especialmente despus de tener fiebre o una infeccin. Solicite ayuda de inmediato si:  Tiene dificultad para respirar. Resumen  La miastenia gravis (MG) es una enfermedad prolongada (crnica) que causa debilidad en los msculos que se pueden controlar (msculos de movimiento voluntario).  Un sntoma de la miastenia gravis es la debilidad muscular que empeora con la actividad y mejora despus del descanso.  Una repentina dificultad grave para respirar (crisis miastnica) puede aparecer despus de tener una infeccin, fiebre o una reaccin adversa a un medicamento.  El objetivo del tratamiento es mejorar la fuerza muscular. El tratamiento puede incluir medicamentos, cambios en el estilo de vida, fisioterapia, [place], plasmafresis o asistencia respiratoria de Freight forwarder. Esta informacin no tiene Marine scientist el consejo del mdico. Asegrese de hacerle al mdico cualquier pregunta que tenga. Document Revised: 06/27/2017 Document Reviewed: 06/27/2017 Elsevier Patient Education  [place].

## 2020-02-03 NOTE — Progress Notes (Signed)
   CC: Weakness  HPI: [name] is a 74 y.o. with PMH listed below presenting with complaint of weakness. Please see problem based assessment and plan for further details.  Past Medical History:  Diagnosis Date  . BPH (benign prostatic hyperplasia)   . DDD (degenerative disc disease), lumbosacral   . Fatty liver   . Foley catheter in place   . History of gallstones   . History of prostatitis   . Hypertension   . IDA (iron deficiency anemia)   . Liver mass 09/17/2001   4.7cm , noted on [place] abd  . Low back pain   . Myasthenia gravis ([facility])   . Nocturia   . 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 Spondylosis Lumbar  . Umbilical hernia   . Urinary retention 02/08/2018   Review of Systems: Review of Systems  Constitutional: Negative for chills, fever and malaise/fatigue.  Respiratory: Negative for cough, shortness of breath and stridor.   Cardiovascular: Negative for chest pain.  Gastrointestinal: Negative for constipation, diarrhea, nausea and vomiting.  Neurological: Positive for weakness. Negative for dizziness, focal weakness and headaches.  All other systems reviewed and are negative.    Physical Exam: Vitals:   02/03/20 1349  BP: (!) 147/79  Pulse: 76  Temp: 98.4 F (36.9 C)  TempSrc: Oral  SpO2: 99%  Weight: 158 lb 14.4 oz (72.1 kg)  Height: 5\' 8"  (1.727 m)   Gen: Well-developed, well nourished, NAD HEENT: NCAT head, hearing intact CV: RRR, S1, S2 normal Pulm: CTAB, No rales, no wheezes Extm: ROM intact, Peripheral pulses intact Neuro: Strength 4/5 all extremities, no dysphagia  Assessment & Plan:   Myasthenia gravis with acute exacerbation (H[facility]) [name] is a 73 yo M w/ PMH of myasthenia gravis presenting to [facility] for hospital follow up visit. He was recently hospitalized for 5 days for acute exacerbation of myasthenia gravis involving dysphagia and weakness. He was treated w/ IVIG and high does IV steroids and had  significant improvement in his symptoms at discharge. He states since discharge, he has been eating well without any episodes of choking or coughing. He mentions continuing to take his 60mg  prednisone and pyridostigmine as prescribed without difficulty and has had no significant side effects. He requests note for work as well as renewal for his disability parking pass  A/P Myasthenia gravis acute exacerbation appears to have resolved. Has f/u with neurology 02/04/2020 at 8am and discussed importance of going to this appointment. Will get blood work to assess for any sequelae of IVIG therapy.  - F/u with neurology - C/w prednisone 60mg , pyridostigmine 60mg  - CBC, CMP   Patient discussed with Dr. Evette Doffing  -Gilberto Better, [facility] Pager: [phone]

## 2020-02-03 NOTE — Discharge Summary (Signed)
Name: Danny Walker MRN: [id number] DOB: 05/05/1946 74 y.o. PCP: [name], MD  Date of Admission: 01/24/2020  2:52 PM Date of Discharge: 01/29/2020 Attending Physician: Dr. Dr. Jimmye Norman  Discharge Diagnosis: Principal Problem:   Myasthenia gravis with acute exacerbation [facility])    Discharge Medications: Allergies as of 01/29/2020   No Known Allergies     Medication List    STOP taking these medications   famotidine 20 MG tablet Commonly known as: PEPCID   ofloxacin 0.3 % ophthalmic solution Commonly known as: OCUFLOX   Toviaz 4 MG Tb24 tablet Generic drug: fesoterodine   Vitamin D (Ergocalciferol) 1.25 MG (50000 UNIT) Caps capsule Commonly known as: DRISDOL     TAKE these medications   diclofenac Sodium 1 % Gel Commonly known as: VOLTAREN Apply 2 g topically 4 (four) times daily.   finasteride 5 MG tablet Commonly known as: PROSCAR Take 1 tablet (5 mg total) by mouth every morning.   lisinopril 40 MG tablet Commonly known as: ZESTRIL TOME UNA TABLETA TODOS LOS DIAS   naproxen 250 MG tablet Commonly known as: NAPROSYN TOME UNA TABLETA DOS VECES AL DIA CUANDO SEA NECESARIO PARA EL DOLOR What changed: See the new instructions.   omeprazole 40 MG capsule Commonly known as: PRILOSEC TOME UNA CAPSULA TODOS LOS DIAS EN LA [place] What changed: See the new instructions.   Oscal 500/200 D-3 500-200 MG-UNIT tablet Generic drug: calcium-vitamin D Take 1 tablet by mouth 2 (two) times daily.   polyvinyl alcohol 1.4 % ophthalmic solution Commonly known as: LIQUIFILM TEARS Place 1 drop into both eyes as needed for dry eyes.   predniSONE 20 MG tablet Commonly known as: Deltasone Take 3 tablets (60 mg total) by mouth daily with breakfast. What changed:   medication strength  how much to take   pyridostigmine 60 MG tablet Commonly known as: Mestinon Take 1 tablet (60 mg total) by mouth in the morning AND 1 tablet (60 mg total) daily with lunch AND 0.5  tablets (30 mg total) at bedtime. What changed: See the new instructions.       Disposition and follow-up:   [name] was discharged from [facility] in Stable condition.  At the hospital follow up visit please address:  1.  Follow-up:  A. MG - make sure patient has follow up with his neurologist. Make sure Patient is tolerating PO intake.    2.  Labs / imaging needed at time of follow-up: cbc, cmp  3.  Pending labs/ test needing follow-up: none Follow-up Appointments:   Hospital Course by problem list: Danny Walker is a 74 year old man with PMH of MG, HTN, iron deficiency and anemia, BPH, and fatty liver w/ h/o mass who presented with increased difficulty swallowing for two days.   Myasthenia gravis flare Patient unable to swallow. After neurology was consulted, they recommended stopping IVIG and pyridostigmine, which he had received in the ED. These were then restarted for a 5-day course of IVIG and long-term pyridostigmine. We continued steroids (40 and then 50 mg solu-medrol daily) throughout his hospital stay. SLP followed closely and advanced his diet as appropriate given his significant oropharyngeal phase dysphagia. PT/OT were consulted and recommended 2-3x/week therapy while inpatient, but by the time of his discharge they had no recommendations for home PT or OT other than supervision by family. RT followed closely for q4 -6 NIF and VC measurements to monitor his respiratory status. These were stable throughout his hospitalization and  he never required ventilatory support.  Asymptomatic Pyuria His UA on admission was significant for nitrites, leukocytes, and many bacteria but the patient remained asymptomatic throughout his hospital course. He received 2g rocephin via IV one time but this was discontinued given likely colonization rather than infection.   History of HTN His blood pressure ranged from the 120s-160s/70s-90s during his stay. Given  his difficulty with oral medications, we held his home zestril 40 mg daily and recommended he continue it after discharge.   History of BPH Given his difficulty with oral medications, we held his home finasteride 5 mg daily and recommended he continue it after discharge.   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 We do not suspect strangulation or incarceration and recommended that this be followed on an outpatient basis.    Discharge Vitals:   BP 130/87 (BP Location: Right Arm)   Pulse 87   Temp 97.9 F (36.6 C) (Oral)   Resp 20   Ht 5\' 7"  (1.702 m)   Wt 69.6 kg   SpO2 94%   BMI 24.03 kg/m   Pertinent Labs, Studies, and Procedures:  CBC Latest Ref Rng & Units 01/28/2020 01/27/2020 01/25/2020  WBC 4.0 - 10.5 K/uL 6.7 6.8 6.3  Hemoglobin 13.0 - 17.0 g/dL 10.9(L) 11.1(L) 9.9(L)  Hematocrit 39 - 52 % 35.6(L) 37.4(L) 33.6(L)  Platelets 150 - 400 K/uL 196 210 168    CMP Latest Ref Rng & Units 01/27/2020 01/25/2020 01/24/2020  Glucose 70 - 99 mg/dL 85 87 118(H)  BUN 8 - 23 mg/dL 22 21 32(H)  Creatinine 0.61 - 1.24 mg/dL 1.13 1.09 1.25(H)  Sodium 135 - 145 mmol/L 137 141 143  Potassium 3.5 - 5.1 mmol/L 3.8 4.1 4.4  Chloride 98 - 111 mmol/L 103 110 110  CO2 22 - 32 mmol/L 25 24 22   Calcium 8.9 - 10.3 mg/dL 8.7(L) 8.7(L) 9.2  Total Protein 6.5 - 8.1 g/dL 7.5 5.9(L) 6.2(L)  Total Bilirubin 0.3 - 1.2 mg/dL 0.6 0.5 0.6  Alkaline Phos 38 - 126 U/L 37(L) 32(L) 36(L)  AST 15 - 41 U/L 21 17 23   ALT 0 - 44 U/L 22 23 31     DG Chest Portable 1 View  Result Date: 01/24/2020 CLINICAL DATA:  Shortness of breath, difficulty eating and drinking EXAM: PORTABLE CHEST 1 VIEW COMPARISON:  Radiograph 01/10/2020 FINDINGS: Nonspecific elevation of the left hemidiaphragm. This is similar to most recent comparison radiograph though appears progressive from studies since 2019. Some adjacent streaky opacities, likely atelectatic changes are present. Blunting of the left  costophrenic sulcus may reflect some scarring or atelectasis without convincing features of large sub pleural effusion. No right effusion or pneumothorax. No consolidative opacity. Lungs are otherwise clear. The aorta is calcified. The remaining cardiomediastinal contours are unremarkable. No acute osseous or soft tissue abnormality. Degenerative changes are present in the imaged spine and shoulders. Telemetry leads overlie the chest. IMPRESSION: 1. Nonspecific elevation of the left hemidiaphragm, similar to most recent comparison radiograph though appears progressive from studies since 2019. Adjacent streaky opacities, likely atelectatic changes. 2. Scarring and/or atelectasis at the left costophrenic sulcus. No discernible effusions. Electronically Signed   By: Lovena Le M.D.   On: 01/24/2020 16:12     Discharge Instructions: Discharge Instructions    Diet - low sodium heart healthy   Complete by: As directed    Discharge instructions   Complete by: As directed    You were hospitalized for Myasthenia Crisis.  Thank you for allowing [place] to be part of your care.    Please follow up with the following providers: 1. Neurology 2. Primary Care Physician  Please note these changes made to your medications:   - Medications to continue: 1. Medications to start:1. Mestinon 60 mg in the morning, 60 mg with lunch, and 30 mg with dinner 2. Prednisone 60 mg daily  - Medications to discontinue:  1. Please stop Tovias until you follow up with your PCP  Please make sure to take your medications and follow up with your doctors.   Please call our clinic if you have any questions or concerns, we may be able to help and keep you from a long and expensive emergency room wait. Our clinic and after hours phone number is [phone], the best time to call is Monday through Friday 9 am to 4 pm but there is always someone available 24/7 if you have an emergency. If you need medication refills please notify your  pharmacy one week in advance and they will send [place] a request.   Increase activity slowly   Complete by: As directed       Signed: Marianna Payment, MD 02/03/2020, 1:52 PM   Pager: [phone]

## 2020-02-04 ENCOUNTER — Telehealth: Payer: Self-pay

## 2020-02-04 ENCOUNTER — Encounter: Payer: Self-pay | Admitting: Neurology

## 2020-02-04 ENCOUNTER — Ambulatory Visit (INDEPENDENT_AMBULATORY_CARE_PROVIDER_SITE_OTHER): Payer: Medicare Other | Admitting: Neurology

## 2020-02-04 VITALS — BP 115/69 | HR 109 | Ht 68.0 in | Wt 157.0 lb

## 2020-02-04 DIAGNOSIS — G7000 Myasthenia gravis without (acute) exacerbation: Secondary | ICD-10-CM

## 2020-02-04 LAB — CMP14 + ANION GAP
ALT: 30 IU/L (ref 0–44)
AST: 25 IU/L (ref 0–40)
Albumin/Globulin Ratio: 1 — ABNORMAL LOW (ref 1.2–2.2)
Albumin: 3.7 g/dL (ref 3.7–4.7)
Alkaline Phosphatase: 49 IU/L (ref 44–121)
Anion Gap: 18 mmol/L (ref 10.0–18.0)
BUN/Creatinine Ratio: 30 — ABNORMAL HIGH (ref 10–24)
BUN: 31 mg/dL — ABNORMAL HIGH (ref 8–27)
Bilirubin Total: 0.2 mg/dL (ref 0.0–1.2)
CO2: 20 mmol/L (ref 20–29)
Calcium: 9.6 mg/dL (ref 8.6–10.2)
Chloride: 104 mmol/L (ref 96–106)
Creatinine, Ser: 1.02 mg/dL (ref 0.76–1.27)
GFR calc Af Amer: 84 mL/min/{1.73_m2} (ref >59)
GFR calc non Af Amer: 73 mL/min/{1.73_m2} (ref >59)
Globulin, Total: 3.8 g/dL (ref 1.5–4.5)
Glucose: 137 mg/dL — ABNORMAL HIGH (ref 65–99)
Potassium: 5 mmol/L (ref 3.5–5.2)
Sodium: 142 mmol/L (ref 134–144)
Total Protein: 7.5 g/dL (ref 6.0–8.5)

## 2020-02-04 LAB — CBC
Hematocrit: 36.1 % — ABNORMAL LOW (ref 37.5–51.0)
Hemoglobin: 11.1 g/dL — ABNORMAL LOW (ref 13.0–17.7)
MCH: 25.9 pg — ABNORMAL LOW (ref 26.6–33.0)
MCHC: 30.7 g/dL — ABNORMAL LOW (ref 31.5–35.7)
MCV: 84 fL (ref 79–97)
Platelets: 223 10*3/uL (ref 150–450)
RBC: 4.29 x10E6/uL (ref 4.14–5.80)
RDW: 19.3 % — ABNORMAL HIGH (ref 11.6–15.4)
WBC: 12 10*3/uL — ABNORMAL HIGH (ref 3.4–10.8)

## 2020-02-04 NOTE — Telephone Encounter (Signed)
Called and spoke to [place] and informed him of IVIG being out on hold and that we would contact them when patient needs to resume.

## 2020-02-04 NOTE — Telephone Encounter (Signed)
Waunita Schooner from [employer] called and wanted to confirm patients IVIG dosing. Currently they have him on a loading dose of 2g and wanted to check with Dr. Posey Pronto if patient should be on a maintenance dose. Waunita Schooner stated that he wanted to double check with Dr. Posey Pronto to get correct dosing and if patient should be on a loading dose or maintenance dose. Also, patient was in the hospital and he thinks they may have had patient on the loading dose while admitted.

## 2020-02-04 NOTE — Patient Instructions (Signed)
[place] 1: tome prednisona 30 mg por la maana y por la tarde [place] 2: Tome prednisona 20 mg por la maana y por la tarde. Contina esto hasta que me veas de [place]

## 2020-02-04 NOTE — Telephone Encounter (Signed)
Please let them know that I will be putting IVIG on hold, since he did receive it in the hospital and is doing better. If we need to restart it, we will contact them. Thanks.

## 2020-02-04 NOTE — Progress Notes (Signed)
Internal Medicine Clinic Attending  Case discussed with Dr. Lee  At the time of the visit.  We reviewed the resident's history and exam and pertinent patient test results.  I agree with the assessment, diagnosis, and plan of care documented in the resident's note.    

## 2020-02-04 NOTE — Progress Notes (Signed)
Follow-up Visit   Date: 02/04/20   Danny Walker MRN: [id number] DOB: 12/18/1945   Interim History: Danny Walker is a 74 y.o. Spanish-speaking male from [place] with history of hypertension, prostatitis, liver mass, and urinary retention returning to the clinic for follow-up of seropositive myasthenia gravis.  The patient was accompanied to the clinic by Spanish interpretor.  History of present illness: Patient was hospitalized at [facility] from 11/11 - 04/12/2018 with 64-month history of progressive difficulty swallowing, facial weakness, vertical double vision, and 30lb weight loss.  Due to high clinical suspicion for myasthenia gravis, he was started on mestinon 60mg  TID with marked improvement.  He necessitated brief placement of NG tube for feeding and medications.  MRI brain showed chronic microischemic changes.  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  He was started on prednisone 20mg  and titrated to 60mg /d.  He was on prednisone 60mg  daily and mestinon 60mg  four times daily (4am, 10am, 4pm, 10am) since November 2019, which has successfully been tapered.  Fortunately, he has not had any relapse while coming down on prednisone and has been on 10mg  since August 2020.  Due to leg cramps, his mestinon was reduced to 30mg  BID which has helped.  Patient was doing well until July 2021 when he began having bulbar weakness.   UPDATE 02/03/2020:  He was hospitalized from 9/3 - 9/8 with myasthenia gravis exacerbation due to progressive dysarthria, dysphagia, and neck heaviness.  He was treated with IVIG x 5.  He was also started on prednisone 60mg /d and mestinon adjusted to Mestinon 60mg  at 7am, 60mg  at noon, and 30mg  at 5pm.  He has appreciated marked improvement in speech and swallow.  He denies any ptosis, double vision, difficulty with speech/swallowing, or limb weakness.   Medications:  Current Outpatient  Medications on File Prior to Visit  Medication Sig Dispense Refill  . calcium-vitamin D (OSCAL 500/200 D-3) 500-200 MG-UNIT tablet Take 1 tablet by mouth 2 (two) times daily. 60 tablet 11  . diclofenac Sodium (VOLTAREN) 1 % GEL Apply 2 g topically 4 (four) times daily. 100 g 3  . lisinopril (ZESTRIL) 40 MG tablet TOME UNA TABLETA TODOS LOS DIAS 90 tablet 3  . naproxen (NAPROSYN) 250 MG tablet TOME UNA TABLETA DOS VECES AL DIA CUANDO SEA NECESARIO PARA EL DOLOR (Patient taking differently: Take 250 mg by mouth daily as needed (pain). ) 28 tablet 0  . omeprazole (PRILOSEC) 40 MG capsule TOME UNA CAPSULA TODOS LOS DIAS EN LA MANANA (Patient taking differently: Take 40 mg by mouth daily. ) 90 capsule 3  . polyvinyl alcohol (LIQUIFILM TEARS) 1.4 % ophthalmic solution Place 1 drop into both eyes as needed for dry eyes. 15 mL 0  . predniSONE (DELTASONE) 20 MG tablet Take 3 tablets (60 mg total) by mouth daily with breakfast. 90 tablet 0  . pyridostigmine (MESTINON) 60 MG tablet Take 1 tablet (60 mg total) by mouth in the morning AND 1 tablet (60 mg total) daily with lunch AND 0.5 tablets (30 mg total) at bedtime. 75 tablet 0  . finasteride (PROSCAR) 5 MG tablet Take 1 tablet (5 mg total) by mouth every morning. (Patient not taking: Reported on 01/25/2020) 90 tablet 3   No current facility-administered medications on file prior to visit.    Allergies: No Known Allergies  Vital Signs:  BP 115/69   Pulse (!) 109   Ht 5\' 8"  (1.727 m)  Wt 157 lb (71.2 kg)   SpO2 96%   BMI 23.87 kg/m   Neurological Exam: MENTAL STATUS including orientation to time, place, person, recent and remote memory, attention span and concentration, language, and fund of knowledge is normal.  Speech clear, no dysarthria.   CRANIAL NERVES:  Pupils are round and reactive. Normal conjugate, extra-ocular eye movements in all directions of gaze.  No ptosis at rest or with sustained upgaze.  No facial weakness, orbicularis oculi,  orbicularis oris, and buccinator is 5/5.  Tongue strength is 5/5, side-to-side movements are normal.  MOTOR:  Motor strength is 5/5 in all extremities, no fatigability.  Neck flexion and extension is 5/5  REFLEXES:  2+/4 in the upper extremities.    COORDINATION/GAIT:  Easily able to stand up without using arms to push off.   Gait narrow based and stable.   Data: MRI brain wo contrast 04/03/2018: 1. Discontinued examination due to patient's cessation of choking. 2. No acute intracranial abnormality. Chronic small vessel ischemia.  CT chest w contrast 04/07/2018: No evidence of anterior mediastinal mass to suggest thymoma. No acute cardiopulmonary disease.  Labs 04/04/2018:  AChR binding 7.86*, blocking 65*, modulating 57*, TSH 1.77, CK 75, HIV neg, RPR neg   IMPRESSION/PLAN: 1. 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  Previously managed on prednisone 10mg /d. Recently discharged with MG exacerbation and treated with IVIG and prednisone.  Clinically, he does not have any motor weakness.    - Continue prednisone 60mg /d x 1 week, then reduce to 40mg  daily  - Continue mestinon 60mg  at 7am, 60mg  at noon, and 30mg  at 5pm  - monitor for signs of weakness as prednisone is being tapered  2.  Longterm steroid side effects              - Continue omeprazole 40mg  daily              - Continue calcium 1200 mg/day and vitamin D intake of 800 international units/day  Return to clinic in 1 month  Total time spent reviewing records, interview, history/exam, documentation, counseling, and coordination of care on day of encounter:  30 min     Thank you for allowing me to participate in patient's care.  If I can answer any additional questions, I would be pleased to do so.    Sincerely,    Calisa Luckenbaugh K. Posey Pronto, DO

## 2020-02-20 ENCOUNTER — Telehealth: Payer: Self-pay | Admitting: Neurology

## 2020-02-20 ENCOUNTER — Ambulatory Visit (INDEPENDENT_AMBULATORY_CARE_PROVIDER_SITE_OTHER): Payer: Medicare Other | Admitting: Neurology

## 2020-02-20 ENCOUNTER — Encounter: Payer: Self-pay | Admitting: Neurology

## 2020-02-20 ENCOUNTER — Other Ambulatory Visit: Payer: Self-pay

## 2020-02-20 VITALS — BP 144/83 | HR 82 | Ht 68.0 in | Wt 160.0 lb

## 2020-02-20 DIAGNOSIS — G7001 Myasthenia gravis with (acute) exacerbation: Secondary | ICD-10-CM

## 2020-02-20 MED ORDER — PREDNISONE 20 MG PO TABS
20.0000 mg | ORAL_TABLET | Freq: Three times a day (TID) | ORAL | 2 refills | Status: DC
Start: 2020-02-20 — End: 2020-05-19

## 2020-02-20 NOTE — Telephone Encounter (Signed)
Patient is requesting a letter from Dr. Posey Pronto excusing him from work at [employer] for 2 weeks due to weakness.   He'd like the letter in [place], if possible.   Please call him when it is ready for pickup.

## 2020-02-20 NOTE — Progress Notes (Signed)
Follow-up Visit   Date: 02/20/20   Danny Walker MRN: [id number] DOB: May 20, 1946   Interim History: Danny Walker is a 74 y.o. Spanish-speaking male from [place] with history of hypertension, prostatitis, liver mass, and urinary retention returning to the clinic for follow-up of seropositive myasthenia gravis.  The patient was accompanied to the clinic by self.  Spanish interpretor services were used via telephone.  History of present illness: Patient was hospitalized at [facility] from 11/11 - 04/12/2018 with 37-month history of progressive difficulty swallowing, facial weakness, vertical double vision, and 30lb weight loss.  Due to high clinical suspicion for myasthenia gravis, he was started on mestinon 60mg  TID with marked improvement.  He necessitated brief placement of NG tube for feeding and medications.  MRI brain showed chronic microischemic changes.  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  He was started on prednisone 20mg  and titrated to 60mg /d.  He was on prednisone 60mg  daily and mestinon 60mg  four times daily (4am, 10am, 4pm, 10am) since November 2019, which has successfully been tapered.  Fortunately, he has not had any relapse while coming down on prednisone and has been on 10mg  since August 2020.  Due to leg cramps, his mestinon was reduced to 30mg  BID which has helped.  Patient was doing well until July 2021 when he began having bulbar weakness.   UPDATE 02/20/20:  He is here as walk-in due to worsening weakness over the past week when he reduced prendisone down to 40mg /d.  He felt that his arms and legs were weaker after he would have breakfast, lunch and dinner.  No associated double vision, droopy eyelids, or difficulty swallowing or talking.   He remains on prenisone 20mg  BID and mestinon 60mg  in the morning, 60mg  in the afternoon, and 30mg  at bedtime.    Medications:  Current Outpatient  Medications on File Prior to Visit  Medication Sig Dispense Refill  . calcium-vitamin D (OSCAL 500/200 D-3) 500-200 MG-UNIT tablet Take 1 tablet by mouth 2 (two) times daily. 60 tablet 11  . diclofenac Sodium (VOLTAREN) 1 % GEL Apply 2 g topically 4 (four) times daily. 100 g 3  . finasteride (PROSCAR) 5 MG tablet Take 1 tablet (5 mg total) by mouth every morning. 90 tablet 3  . lisinopril (ZESTRIL) 40 MG tablet TOME UNA TABLETA TODOS LOS DIAS 90 tablet 3  . naproxen (NAPROSYN) 250 MG tablet TOME UNA TABLETA DOS VECES AL DIA CUANDO SEA NECESARIO PARA EL DOLOR (Patient taking differently: Take 250 mg by mouth daily as needed (pain). ) 28 tablet 0  . omeprazole (PRILOSEC) 40 MG capsule TOME UNA CAPSULA TODOS LOS DIAS EN LA MANANA (Patient taking differently: Take 40 mg by mouth daily. ) 90 capsule 3  . polyvinyl alcohol (LIQUIFILM TEARS) 1.4 % ophthalmic solution Place 1 drop into both eyes as needed for dry eyes. 15 mL 0  . predniSONE (DELTASONE) 20 MG tablet Take 3 tablets (60 mg total) by mouth daily with breakfast. 90 tablet 0  . pyridostigmine (MESTINON) 60 MG tablet Take 1 tablet (60 mg total) by mouth in the morning AND 1 tablet (60 mg total) daily with lunch AND 0.5 tablets (30 mg total) at bedtime. 75 tablet 0   No current facility-administered medications on file prior to visit.    Allergies: No Known Allergies  Vital Signs:  BP (!) 144/83   Pulse 82   Ht 5\' 8"  (1.727 m)  Wt 160 lb (72.6 kg)   SpO2 96%   BMI 24.33 kg/m   Neurological Exam: MENTAL STATUS including orientation to time, place, person, recent and remote memory, attention span and concentration, language, and fund of knowledge is normal.  Speech is clear, no dysarthria.   CRANIAL NERVES:  Pupils are round and reactive. Normal conjugate, extra-ocular eye movements in all directions of gaze.  Trace ptosis at rest or with sustained upgaze. There is very mild w  MOTOR:  Motor strength is 5/5 in all extremities, except  bilateral legs are 4+/5.  Neck flexion and extension is 5/5  COORDINATION/GAIT:  Unable to stand up without using arms to push off.   Gait narrow based and stable.   Data: MRI brain wo contrast 04/03/2018: 1. Discontinued examination due to patient's cessation of choking. 2. No acute intracranial abnormality. Chronic small vessel ischemia.  CT chest w contrast 04/07/2018: No evidence of anterior mediastinal mass to suggest thymoma. No acute cardiopulmonary disease.  Labs 04/04/2018:  AChR binding 7.86*, blocking 65*, modulating 57*, TSH 1.77, CK 75, HIV neg, RPR neg   IMPRESSION/PLAN: 1. Seropositive bulbar myasthenia gravis with exacerbation (diagnosed 03/2018) in the setting of prednisone taper  - Increase prednisone back to 88m/d divided into three dosages (20mg  with breakfast, lunch, and dinner).   - Continue mestinon 60mg  at 7am, 60mg  at noon, and 30mg  at 5pm  - Low threshold to start home IVIG, if he develops new weakness  2.  Longterm steroid side effects              - Continue omeprazole 40mg  daily              - Continue calcium 1200 mg/day and vitamin D intake of 800 international units/day  Return to clinic in 1 month  Total time spent reviewing records, interview, history/exam, documentation, and coordination of care on day of encounter:  30 min        Thank you for allowing me to participate in patient's care.  If I can answer any additional questions, I would be pleased to do so.    Sincerely,    Orin Eberwein K. Posey Pronto, DO

## 2020-02-20 NOTE — Patient Instructions (Signed)
Start prednisone 20mg  three times daily .  Stay on this dose until you see me again

## 2020-02-21 DIAGNOSIS — R29898 Other symptoms and signs involving the musculoskeletal system: Secondary | ICD-10-CM

## 2020-02-21 HISTORY — DX: Other symptoms and signs involving the musculoskeletal system: R29.898

## 2020-02-21 NOTE — Telephone Encounter (Signed)
Patient came and picked up letter.

## 2020-02-21 NOTE — Telephone Encounter (Signed)
Called patient and left a message that letter is ready for pick up at front desk.

## 2020-02-21 NOTE — Telephone Encounter (Signed)
Letter written and ready for pickup.  I am unable to write in [place].

## 2020-02-28 ENCOUNTER — Encounter (HOSPITAL_COMMUNITY): Payer: Self-pay

## 2020-02-28 ENCOUNTER — Other Ambulatory Visit: Payer: Self-pay

## 2020-02-28 ENCOUNTER — Emergency Department (HOSPITAL_COMMUNITY)
Admission: EM | Admit: 2020-02-28 | Discharge: 2020-02-28 | Discharge status: Against Medical Advice | Payer: Medicare Other | Source: Home / Self Care | Attending: Emergency Medicine | Admitting: Emergency Medicine

## 2020-02-28 ENCOUNTER — Telehealth: Payer: Self-pay

## 2020-02-28 ENCOUNTER — Emergency Department (HOSPITAL_COMMUNITY): Payer: Medicare Other

## 2020-02-28 DIAGNOSIS — Z79899 Other long term (current) drug therapy: Secondary | ICD-10-CM | POA: Insufficient documentation

## 2020-02-28 DIAGNOSIS — G7000 Myasthenia gravis without (acute) exacerbation: Secondary | ICD-10-CM

## 2020-02-28 DIAGNOSIS — I10 Essential (primary) hypertension: Secondary | ICD-10-CM | POA: Insufficient documentation

## 2020-02-28 DIAGNOSIS — R748 Abnormal levels of other serum enzymes: Secondary | ICD-10-CM | POA: Insufficient documentation

## 2020-02-28 DIAGNOSIS — R9431 Abnormal electrocardiogram [ECG] [EKG]: Secondary | ICD-10-CM

## 2020-02-28 DIAGNOSIS — R778 Other specified abnormalities of plasma proteins: Secondary | ICD-10-CM

## 2020-02-28 DIAGNOSIS — R531 Weakness: Secondary | ICD-10-CM | POA: Diagnosis not present

## 2020-02-28 DIAGNOSIS — G7001 Myasthenia gravis with (acute) exacerbation: Secondary | ICD-10-CM | POA: Insufficient documentation

## 2020-02-28 DIAGNOSIS — E860 Dehydration: Secondary | ICD-10-CM | POA: Diagnosis not present

## 2020-02-28 LAB — BASIC METABOLIC PANEL
Anion gap: 9 (ref 5–15)
BUN: 30 mg/dL — ABNORMAL HIGH (ref 8–23)
CO2: 27 mmol/L (ref 22–32)
Calcium: 8.9 mg/dL (ref 8.9–10.3)
Chloride: 104 mmol/L (ref 98–111)
Creatinine, Ser: 1.08 mg/dL (ref 0.61–1.24)
GFR, Estimated: >60 mL/min (ref >60)
Glucose, Bld: 165 mg/dL — ABNORMAL HIGH (ref 70–99)
Potassium: 4.3 mmol/L (ref 3.5–5.1)
Sodium: 140 mmol/L (ref 135–145)

## 2020-02-28 LAB — CBC
HCT: 37.2 % — ABNORMAL LOW (ref 39.0–52.0)
Hemoglobin: 11.3 g/dL — ABNORMAL LOW (ref 13.0–17.0)
MCH: 27.5 pg (ref 26.0–34.0)
MCHC: 30.4 g/dL (ref 30.0–36.0)
MCV: 90.5 fL (ref 80.0–100.0)
Platelets: 177 10*3/uL (ref 150–400)
RBC: 4.11 MIL/uL — ABNORMAL LOW (ref 4.22–5.81)
RDW: 20.1 % — ABNORMAL HIGH (ref 11.5–15.5)
WBC: 13.1 10*3/uL — ABNORMAL HIGH (ref 4.0–10.5)
nRBC: 0.2 % (ref 0.0–0.2)

## 2020-02-28 LAB — URINALYSIS, ROUTINE W REFLEX MICROSCOPIC
Bilirubin Urine: NEGATIVE
Glucose, UA: NEGATIVE mg/dL
Hgb urine dipstick: NEGATIVE
Ketones, ur: NEGATIVE mg/dL
Nitrite: POSITIVE — AB
Protein, ur: NEGATIVE mg/dL
Specific Gravity, Urine: 1.008 (ref 1.005–1.030)
pH: 6 (ref 5.0–8.0)

## 2020-02-28 LAB — TROPONIN I (HIGH SENSITIVITY)
Troponin I (High Sensitivity): 30 ng/L — ABNORMAL HIGH (ref <18)
Troponin I (High Sensitivity): 27 ng/L — ABNORMAL HIGH (ref <18)

## 2020-02-28 NOTE — Telephone Encounter (Signed)
Spoke to [place] and she said that the main complaint is his weakness. It has gotten worse. Legs are very weak an deyes are bothering him. He walked into his eye doctor clinic and a nurse checked him out there and made him an appt for this coming monday. She said he was having shortness of breath when she called him because he was rushing to put on clothes, but those are the only times he gets it when he is "rushing." She was calling to see about the infusions and what they were. I explained to her what they were and that it is something that was discussed with him. She asked if he should come back to see you? I informed her I would let you know everything going on and would call her on what we decided to do. I informed her that it is VERY important that we know if he is having shortness of breath or heaviness in his neck and needs to be going to the ER STAT if that occurs. She understood the importance of that. She was really nice but is just trying to help him since he can't speak english

## 2020-02-28 NOTE — Discharge Instructions (Signed)
We strongly recommended admission to the hospital for further management.  Please follow-up with your primary care doctor, cardiologist as well as your neurologist.  If you change your mind or you have any worsening of your condition including any increased weakness, fatigue, chest pain, difficulty breathing or other concerns symptom, please return immediately to the emergency room for reassessment.

## 2020-02-28 NOTE — ED Provider Notes (Signed)
[facility] EMERGENCY DEPARTMENT Provider Note   CSN: [id number] Arrival date & time: 02/28/20  1538     History Chief Complaint  Patient presents with  . Dizziness    Danny Walker is a 74 y.o. male.  Presents to ER with concern for generalized weakness.  Utilized Art therapist.   Additional history obtained from stepdaughter at bedside.  Patient has a history of myasthenia gravis, myasthenic crisis early September requiring IVIG.  Over the past week, he has had increased generalized weakness, fatigue.  Feels like his legs are more weak than normal and his eyelids are little more droopy than normal.  Reports his symptoms are relatively mild compared to his myasthenia crisis requiring admission.  He is currently on prednisone 60 mg daily.  Followed closely by Dr. Posey Pronto.  He has no chest pain, difficulty in breathing, lightheadedness, passing out.  No recent fevers. HPI     Past Medical History:  Diagnosis Date  . BPH (benign prostatic hyperplasia)   . DDD (degenerative disc disease), lumbosacral   . Fatty liver   . Foley catheter in place   . History of gallstones   . History of prostatitis   . Hypertension   . IDA (iron deficiency anemia)   . Liver mass 09/17/2001   4.7cm , noted on [place] abd  . Low back pain   . Myasthenia gravis ([place])   . Nocturia   . 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 Spondylosis Lumbar  . Umbilical hernia   . Urinary retention 02/08/2018    Patient Active Problem List   Diagnosis Date Noted  . Myasthenia gravis with acute exacerbation ([place]) 01/24/2020  . Hip pain 09/30/2019  . Right shoulder pain 08/20/2019  . Health care maintenance 12/25/2018  . Gastroesophageal reflux disease 04/18/2018  . Essential hypertension 04/18/2018  . Protein-calorie malnutrition, severe 04/06/2018  . Myasthenia gravis, bulbar ([place]) 04/06/2018  . Bulbar weakness ([place]) 04/03/2018  . Unintentional  weight loss 04/03/2018  . BPH (benign prostatic hyperplasia) 03/05/2018  . Umbilical hernia 23/53/6144    Past Surgical History:  Procedure Laterality Date  . ERCP W/ SPHICTEROTOMY  09/17/2001  . INSERTION OF MESH N/A 02/05/2016   Procedure: INSERTION OF MESH, UMBILICAL HERNIA;  Surgeon: Armandina Gemma, MD;  Location: [facility];  Service: General;  Laterality: N/A;  . LAPAROSCOPIC CHOLECYSTECTOMY  09/22/2001  . TRANSURETHRAL RESECTION OF PROSTATE N/A 03/05/2018   Procedure: TRANSURETHRAL RESECTION OF THE PROSTATE (TURP);  Surgeon: Cleon Gustin, MD;  Location: [facility];  Service: Urology;  Laterality: N/A;  . UMBILICAL HERNIA REPAIR N/A 02/05/2016   Procedure: UMBILICAL HERNIA REPAIR WITH MESH PATCH;  Surgeon: Armandina Gemma, MD;  Location: [facility];  Service: General;  Laterality: N/A;       Family History  Problem Relation Age of Onset  . Hypertension Mother   . Hypertension Father     Social History   Tobacco Use  . Smoking status: Never Smoker  . Smokeless tobacco: Never Used  Vaping Use  . Vaping Use: Never used  Substance Use Topics  . Alcohol use: No  . Drug use: Never    Home Medications Prior to Admission medications   Medication Sig Start Date End Date Taking? Authorizing Provider  calcium-vitamin D (OSCAL 500/200 D-3) 500-200 MG-UNIT tablet Take 1 tablet by mouth 2 (two) times daily. 01/06/20  Yes Narda Amber K, DO  diclofenac Sodium (VOLTAREN) 1 % GEL Apply 2 g topically 4 (four) times daily. Patient taking differently: Apply 2 g topically 4 (four) times daily as needed (pain).  12/16/19  Yes Mosetta Anis, MD  fesoterodine (TOVIAZ) 4 MG TB24 tablet Take 4 mg by mouth daily.   Yes [provider]  finasteride (PROSCAR) 5 MG tablet Take 1 tablet (5 mg total) by mouth every morning. 10/14/19  Yes [name], MD  lisinopril (ZESTRIL) 40 MG tablet TOME UNA TABLETA TODOS LOS DIAS 12/16/19  Yes Mosetta Anis, MD  naproxen (NAPROSYN) 250 MG tablet TOME UNA TABLETA DOS VECES AL DIA CUANDO SEA [place] PARA EL DOLOR Patient taking differently: Take 250 mg by mouth daily as needed (pain).  01/14/20  Yes [name], MD  omeprazole (PRILOSEC) 40 MG capsule TOME UNA CAPSULA TODOS LOS DIAS EN LA [place] Patient taking differently: Take 40 mg by mouth daily.  09/18/19  Yes Patel, Donika K, DO  polyvinyl alcohol (LIQUIFILM TEARS) 1.4 % ophthalmic solution Place 1 drop into both eyes as needed for dry eyes. 04/12/18  Yes [place], Carley D, DO  predniSONE (DELTASONE) 20 MG tablet Take 1 tablet (20 mg total) by mouth 3 (three) times daily with meals. 02/20/20  Yes Patel, Donika K, DO  pyridostigmine (MESTINON) 60 MG tablet Take 1 tablet (60 mg total) by mouth in the morning AND 1 tablet (60 mg total) daily with lunch AND 0.5 tablets (30 mg total) at bedtime. 01/29/20 02/28/20 Yes Marianna Payment, MD    Allergies    Patient has no known allergies.  Review of Systems   Review of Systems  Constitutional: Positive for fatigue. Negative for chills and fever.  HENT: Negative for ear pain and sore throat.   Eyes: Negative for pain and visual disturbance.  Respiratory: Negative for cough and shortness of breath.   Cardiovascular: Negative for chest pain and palpitations.  Gastrointestinal: Negative for abdominal pain and vomiting.  Genitourinary: Negative for dysuria and hematuria.  Musculoskeletal: Negative for arthralgias and back pain.  Skin: Negative for color change and rash.  Neurological: Positive for weakness. Negative for seizures and syncope.  All other systems reviewed and are negative.   Physical Exam Updated Vital Signs BP (!) 167/80 (BP Location: Left Arm)   Pulse 66   Temp (!) 97.5 F (36.4 C) (Oral)   Resp 19   Ht 5\' 8"  (1.727 m)   Wt 72.5 kg   SpO2 100%   BMI 24.30 kg/m   Physical Exam Vitals and nursing note reviewed.  Constitutional:      Appearance: He is  well-developed.  HENT:     Head: Normocephalic and atraumatic.  Eyes:     Conjunctiva/sclera: Conjunctivae normal.  Cardiovascular:     Rate and Rhythm: Normal rate and regular rhythm.     Heart sounds: No murmur heard.   Pulmonary:     Effort: Pulmonary effort is normal. No respiratory distress.     Breath sounds: Normal breath sounds.  Abdominal:     Palpations: Abdomen is soft.     Tenderness: There is no abdominal tenderness.  Musculoskeletal:     Cervical back: Neck supple.  Skin:    General: Skin is warm and dry.  Neurological:     Mental Status: He is alert.     Comments: AAOx3 CN 2-12 intact, speech clear visual fields intact 5/5 strength in b/l UE and LE Sensation to light touch intact in b/l UE and  LE Normal FNF     ED Results / Procedures / Treatments   Labs (all labs ordered are listed, but only abnormal results are displayed) Labs Reviewed  BASIC METABOLIC PANEL - Abnormal; Notable for the following components:      Result Value   Glucose, Bld 165 (*)    BUN 30 (*)    All other components within normal limits  CBC - Abnormal; Notable for the following components:   WBC 13.1 (*)    RBC 4.11 (*)    Hemoglobin 11.3 (*)    HCT 37.2 (*)    RDW 20.1 (*)    All other components within normal limits  URINALYSIS, ROUTINE W REFLEX MICROSCOPIC - Abnormal; Notable for the following components:   Nitrite POSITIVE (*)    Leukocytes,Ua TRACE (*)    Bacteria, UA MANY (*)    All other components within normal limits  TROPONIN I (HIGH SENSITIVITY) - Abnormal; Notable for the following components:   Troponin I (High Sensitivity) 30 (*)    All other components within normal limits  TROPONIN I (HIGH SENSITIVITY) - Abnormal; Notable for the following components:   Troponin I (High Sensitivity) 27 (*)    All other components within normal limits    EKG EKG Interpretation  Date/Time:  Friday February 28 2020 18:13:34 EDT Ventricular Rate:  59 PR Interval:    QRS  Duration: 95 QT Interval:  435 QTC Calculation: 431 R Axis:   -28 Text Interpretation: Sinus rhythm Consider RVH w/ secondary repol abnormality Left ventricular hypertrophy Hyperacute T waves Confirmed by Thayer Jew [phone]) on 02/29/2020 10:40:27 AM   Radiology DG Chest 2 View  Result Date: 02/28/2020 CLINICAL DATA:  Chest pain with lower extremity weakness for a few days. EXAM: CHEST - 2 VIEW COMPARISON:  Radiographs 01/24/2020 and 01/10/2020.  CT 04/07/2018. FINDINGS: The heart size and mediastinal contours are stable with aortic atherosclerosis. There is stable chronic elevation of the left hemidiaphragm with associated chronic left basilar atelectasis. The right lung is clear. There is no pleural effusion or pneumothorax. Diffuse degenerative changes are present throughout the thoracic spine. There is a convex right thoracolumbar scoliosis. No acute osseous findings. IMPRESSION: Stable chest. No acute cardiopulmonary process. Electronically Signed   By: Richardean Sale M.D.   On: 02/28/2020 18:21    Procedures Procedures (including critical care time)  Medications Ordered in ED Medications - No data to display  ED Course  I have reviewed the triage vital signs and the nursing notes.  Pertinent labs & imaging results that were available during my care of the patient were reviewed by me and considered in my medical decision making (see chart for details).  Clinical Course as of Mar 01 1503  Fri Feb 28, 2020  4287 Repeat ekg with more pronounced ST segment changes, will review with STEMI doc, repeat trop sent  EKG 12-Lead [RD]  1839 D/w Gwenlyn Found - no STEMI, cycle troponins, recommends hospitalist admission with cards consult in am   [RD]    Clinical Course User Index [RD] Lucrezia Starch, MD   MDM Rules/Calculators/A&P                          74 year old male presents to ER with concern for fatigue, lower extremity weakness.  History of myasthenia gravis, patient felt  symptoms relatively mild compared to the past weakness.  He has normal strength in neuro exam, no focal neurologic deficits.  I consulted  neurology who came to bedside.  He did not feel strongly that patient needed admission and inpatient IVIG but felt reasonable to admit for observation.  Patient's initial EKG concerning for hyperacute T waves, significant change in morphology from EKG on 9/3. His initial troponin was mildly elevated.  Repeat EKG with slightly more pronounced T wave changes, mild ST segment changes.  I reviewed with Dr. Alvester Chou on-call for STEMI, no STEMI particularly given lack of ACS symptoms, but recommended admission to hospitalist service for observation, cycling troponins and cardiology consultation in the morning.  Patient had no anginal symptoms however given these profound EKG changes and mildly elevated troponin, I strongly recommended to the patient that he be admitted for further observation, consultation, and work-up.  Explained my concerns for possible heart attack or other acute cardiac process, concern for clinical decompensation including death.  Patient acknowledged my concerns and risks of discharge.  Stepdaughter at bedside witnessed conversation.  Patient is alert and oriented x3, able to demonstrate understanding of my concerns and believe he has competence to make decision.  He will sign out against my medical advice.  I have stressed if he changes his mind or he develops any worsening symptoms cardiac or neurologic or otherwise that he should return immediately to ER.  Stepdaughter demonstrated understanding as well.  They were instructed to follow up with cardiology, neurology and PMD.   Final Clinical Impression(s) / ED Diagnoses Final diagnoses:  Abnormal EKG  Elevated troponin  Myasthenia gravis [facility])    Rx / DC Orders ED Discharge Orders         Ordered    Ambulatory referral to Cardiology       Comments: Abnormal ekg, patient left [facility]   02/28/20 2112            Lucrezia Starch, MD 02/29/20 1504

## 2020-02-28 NOTE — ED Triage Notes (Addendum)
Pt arrives to ED w/ c/o intermittent dizziness and generalized weakness x 1 week. Pt

## 2020-02-28 NOTE — Telephone Encounter (Signed)
Called [name] and informed her that with patients worsening symptoms, it is recommend he go to the ER.  Informed her that per Dr. Posey Pronto it seems that he maybe having MG exacerbation despite being on prednisone 60mg /d and needs to be evaluated to be sure a nothing is triggering this, such as infection, and consideration for IVIG or plasmapheresis.   When advised [name] said "Do you know how long the wait is at the ER?" I informed [name] that I was aware and when patients are advised to go to the ER by a Physician its important to listen to those recommendations. [name] was agitated and rushed me off the phone and hung up. Before hanging up she said she would call patient.

## 2020-02-28 NOTE — Telephone Encounter (Signed)
With his worsening symptoms, recommend he go to the ER.  It seems that he maybe having MG exacerbation despite being on prednisone 60mg /d and needs to be evaluated to be sure a nothing is triggering this, such as infection, and consideration for IVIG or plasmapheresis.

## 2020-02-28 NOTE — Consult Note (Addendum)
Neurology Consultation  Reason for Consult: Myasthenic crisis Referring Physician: Dr. Roslynn Amble, EDP  Daughter used as interpreter-patient Spanish-speaking only.  CC: Generalized weakness and dizziness  History is obtained from: Patient, chart, patient's daughter at bedside  HPI: Danny Walker is a 74 y.o. male past medical history of seropositive myasthenia gravis, who was in his usual state of health up until about a week ago and started noticing generalized weakness was sent to the ER for further evaluation. Patient daughter reports that he has been having some trouble with dizziness while walking that has been going on now for a week.  He also reports feeling some generalized weakness along with worsening weakness in his legs while walking.  He was admitted to the hospital in September and received 5 days of IVIG after which she had started to feel better and followed up with outpatient neurology with Dr. Lyndal Rainbow neurology.  She had increased his prednisone a couple weeks ago. The daughter reports the patient is also being mildly depressed and anxious because of his deceased wife's birthday falling in the last week, which always makes him very emotional and withdrawn. She denies that the patient complaining of any chest pain nausea vomiting.  No shortness of breath.  No palpitations.  No headaches.  No difficulty swallowing.  No bowel or bladder issues.  The reached out to the outpatient neurologist and she recommended that patient be checked out in the ER for any systemic infection which might be causing acute worsening of symptoms and also for consideration for IVIG/Plex if symptoms are objectively worse.  In the ER, mild leukocytosis-likely due to steroids. Also noted to have a very mild elevation in troponins.  EKG with some new T wave inversions and acute T wave changes in the anterior leads.   ROS: Obtained and noted in the HPI  Past Medical History:  Diagnosis Date  .  BPH (benign prostatic hyperplasia)   . DDD (degenerative disc disease), lumbosacral   . Fatty liver   . Foley catheter in place   . History of gallstones   . History of prostatitis   . Hypertension   . IDA (iron deficiency anemia)   . Liver mass 09/17/2001   4.7cm , noted on [place] abd  . Low back pain   . Myasthenia gravis ([place])   . Nocturia   . 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 Spondylosis Lumbar  . Umbilical hernia   . Urinary retention 02/08/2018     Family History  Problem Relation Age of Onset  . Hypertension Mother   . Hypertension Father      Social History:   reports that he has never smoked. He has never used smokeless tobacco. He reports that he does not drink alcohol and does not use drugs.  Medications No current facility-administered medications for this encounter.  Current Outpatient Medications:  .  calcium-vitamin D (OSCAL 500/200 D-3) 500-200 MG-UNIT tablet, Take 1 tablet by mouth 2 (two) times daily., Disp: 60 tablet, Rfl: 11 .  diclofenac Sodium (VOLTAREN) 1 % GEL, Apply 2 g topically 4 (four) times daily. (Patient taking differently: Apply 2 g topically 4 (four) times daily as needed (pain). ), Disp: 100 g, Rfl: 3 .  fesoterodine (TOVIAZ) 4 MG TB24 tablet, Take 4 mg by mouth daily., Disp: , Rfl:  .  finasteride (PROSCAR) 5 MG tablet, Take 1 tablet (5 mg total) by mouth every morning., Disp: 90 tablet, Rfl:  3 .  lisinopril (ZESTRIL) 40 MG tablet, TOME UNA TABLETA TODOS LOS DIAS, Disp: 90 tablet, Rfl: 3 .  naproxen (NAPROSYN) 250 MG tablet, TOME UNA TABLETA DOS VECES AL DIA CUANDO SEA [place] PARA EL DOLOR (Patient taking differently: Take 250 mg by mouth daily as needed (pain). ), Disp: 28 tablet, Rfl: 0 .  omeprazole (PRILOSEC) 40 MG capsule, TOME UNA CAPSULA TODOS LOS DIAS EN LA MANANA (Patient taking differently: Take 40 mg by mouth daily. ), Disp: 90 capsule, Rfl: 3 .  polyvinyl alcohol (LIQUIFILM TEARS) 1.4 % ophthalmic  solution, Place 1 drop into both eyes as needed for dry eyes., Disp: 15 mL, Rfl: 0 .  predniSONE (DELTASONE) 20 MG tablet, Take 1 tablet (20 mg total) by mouth 3 (three) times daily with meals., Disp: 90 tablet, Rfl: 2 .  pyridostigmine (MESTINON) 60 MG tablet, Take 1 tablet (60 mg total) by mouth in the morning AND 1 tablet (60 mg total) daily with lunch AND 0.5 tablets (30 mg total) at bedtime., Disp: 75 tablet, Rfl: 0   Exam: Current vital signs: BP (!) 159/72   Pulse (!) 56   Temp (!) 97.5 F (36.4 C) (Oral)   Resp 19   Ht 5\' 8"  (1.727 m)   Wt 72.5 kg   SpO2 97%   BMI 24.30 kg/m  Vital signs in last 24 hours: Temp:  [97.5 F (36.4 C)] 97.5 F (36.4 C) (10/08 1919) Pulse Rate:  [56-138] 56 (10/08 1919) Resp:  [16-23] 19 (10/08 1919) BP: (139-167)/(72-86) 159/72 (10/08 1919) SpO2:  [97 %-100 %] 97 % (10/08 1919) Weight:  [72.5 kg] 72.5 kg (10/08 1919) General: Awake alert no distress HEENT: Normocephalic atraumatic Lungs: Clear to auscultation Cardiovascular: Regular rate rhythm Abdomen nondistended nontender Extremities warm well perfused with no edema Neurological exam Awake alert oriented x3 Speech is clear. No dysarthria No aphasia Cranial nerves: Pupils equal round react to light, extraocular movements intact, no ptosis noted on sustained upward gaze, no facial weakness appreciated and face is symmetric, tongue and palate elevate midline. Motor exam: Symmetric 5/5 in all 4 extremities without fatigability.  Neck flexion and extension is also 5/5. Sensation intact to light touch. DTRs: Has symmetric normal reflexes throughout the upper extremities. Coordination: No evidence of dysmetria Gait testing was not performed at this time.   Labs I have reviewed labs in epic and the results pertinent to this consultation are:   CBC    Component Value Date/Time   WBC 13.1 (H) 02/28/2020 1616   RBC 4.11 (L) 02/28/2020 1616   HGB 11.3 (L) 02/28/2020 1616   HGB 11.1  (L) 02/03/2020 1504   HCT 37.2 (L) 02/28/2020 1616   HCT 36.1 (L) 02/03/2020 1504   PLT 177 02/28/2020 1616   PLT 223 02/03/2020 1504   MCV 90.5 02/28/2020 1616   MCV 84 02/03/2020 1504   MCH 27.5 02/28/2020 1616   MCHC 30.4 02/28/2020 1616   RDW 20.1 (H) 02/28/2020 1616   RDW 19.3 (H) 02/03/2020 1504   LYMPHSABS 0.6 (L) 01/24/2020 1534   MONOABS 0.4 01/24/2020 1534   EOSABS 0.0 01/24/2020 1534   BASOSABS 0.0 01/24/2020 1534    CMP     Component Value Date/Time   NA 140 02/28/2020 1616   NA 142 02/03/2020 1504   K 4.3 02/28/2020 1616   CL 104 02/28/2020 1616   CO2 27 02/28/2020 1616   GLUCOSE 165 (H) 02/28/2020 1616   BUN 30 (H) 02/28/2020 1616   BUN  31 (H) 02/03/2020 1504   CREATININE 1.08 02/28/2020 1616   CREATININE 0.77 01/01/2013 1045   CALCIUM 8.9 02/28/2020 1616   PROT 7.5 02/03/2020 1504   ALBUMIN 3.7 02/03/2020 1504   AST 25 02/03/2020 1504   ALT 30 02/03/2020 1504   ALKPHOS 49 02/03/2020 1504   BILITOT 0.2 02/03/2020 1504   GFRNONAA >60 02/28/2020 1616   GFRAA 84 02/03/2020 1504    Lipid Panel  No results found for: CHOL, TRIG, HDL, CHOLHDL, VLDL, LDLCALC, LDLDIRECT   Imaging I have reviewed the images obtained: No new brain imaging to review. MRI brain from 04/03/2018-incomplete study due to patient's inability to tolerate but no acute findings on the axial and coronal diffusion-weighted imaging, axial T2 and T2 flair imaging.  Assessment: 74 year old man with seropositive myasthenia gravis, on prednisone 60 mg at home which was recently increased because of worsening symptoms, presenting for evaluation of dizziness and generalized weakness. His examination is unchanged from his outpatient examination of September but he does report subjectively feeling weak. His work-up in the emergency room has revealed mildly elevated troponins and some new EKG changes which might be concerning for a cardiac event but he has no symptoms from a cardiac  standpoint. Emergency room physicians are working with cardiology to get a consultation and possible overnight observation for the cardiac evaluation but at this time I do not think that he requires IVIG. I discussed this with him and his daughter, that if IVIG is needed, they can call Dr. Serita Grit office in the morning on Monday and arrange for outpatient IVIG-possibly after an outpatient appointment but I do not see an emergent need for IVIG or plasma exchange at this time. I think his symptoms are likely related to generalized deconditioning and also have some component of mild anxiety and depression that he is experiencing at this time.  Impression: Myasthenia gravis-stable Depression-outpatient follow-up   Recommendations: -I discussed with the patient and daughter about potentially using IVIG which I do not strongly feel that he needs and they did not want to stay in the hospital for 5 days to get IVIG. -Alternatively, I recommended that they call Dr. Serita Grit office on Monday and see if she is willing to do an outpatient course of IVIG, preferably after the office visit and they are agreeable to that. -UA pending-treat if positive for UTI -Of concern today though, other than the neurological evaluation, is the fact that he has a mild troponin bump as well as some EKG changes concerning for an acute coronary syndrome.  He is asymptomatic-appreciate ER and cardiology input.  Cardiology recommends possibly keeping overnight for observation with serial labs. ED will discuss with the patient and proceed with those recommendations.  I appreciate their assistance. -I discussed my plan in detail with the patient's daughter, patient and Dr. Roslynn Amble in the emergency room. -Neurology will be available as needed. -Please call with questions.  -- Amie Portland, MD [facility] Pager: [phone] If 7pm to 7am, please call on call as listed on AMION.

## 2020-03-01 ENCOUNTER — Inpatient Hospital Stay (HOSPITAL_COMMUNITY)
Admission: EM | Admit: 2020-03-01 | Discharge: 2020-03-03 | DRG: 641 | Disposition: A | Discharge status: Home / Self Care | Payer: Medicare Other | Attending: Internal Medicine | Admitting: Internal Medicine

## 2020-03-01 ENCOUNTER — Other Ambulatory Visit: Payer: Self-pay

## 2020-03-01 ENCOUNTER — Encounter (HOSPITAL_COMMUNITY): Payer: Self-pay

## 2020-03-01 DIAGNOSIS — K219 Gastro-esophageal reflux disease without esophagitis: Secondary | ICD-10-CM | POA: Diagnosis present

## 2020-03-01 DIAGNOSIS — Z7952 Long term (current) use of systemic steroids: Secondary | ICD-10-CM

## 2020-03-01 DIAGNOSIS — D6489 Other specified anemias: Secondary | ICD-10-CM | POA: Diagnosis present

## 2020-03-01 DIAGNOSIS — K760 Fatty (change of) liver, not elsewhere classified: Secondary | ICD-10-CM | POA: Diagnosis present

## 2020-03-01 DIAGNOSIS — G7000 Myasthenia gravis without (acute) exacerbation: Secondary | ICD-10-CM | POA: Diagnosis present

## 2020-03-01 DIAGNOSIS — Z23 Encounter for immunization: Secondary | ICD-10-CM

## 2020-03-01 DIAGNOSIS — N400 Enlarged prostate without lower urinary tract symptoms: Secondary | ICD-10-CM | POA: Diagnosis present

## 2020-03-01 DIAGNOSIS — I10 Essential (primary) hypertension: Secondary | ICD-10-CM | POA: Diagnosis present

## 2020-03-01 DIAGNOSIS — W182XXA Fall in (into) shower or empty bathtub, initial encounter: Secondary | ICD-10-CM | POA: Diagnosis present

## 2020-03-01 DIAGNOSIS — R531 Weakness: Secondary | ICD-10-CM

## 2020-03-01 DIAGNOSIS — R29898 Other symptoms and signs involving the musculoskeletal system: Secondary | ICD-10-CM

## 2020-03-01 DIAGNOSIS — Y92002 Bathroom of unspecified non-institutional (private) residence single-family (private) house as the place of occurrence of the external cause: Secondary | ICD-10-CM

## 2020-03-01 DIAGNOSIS — R296 Repeated falls: Secondary | ICD-10-CM | POA: Diagnosis present

## 2020-03-01 DIAGNOSIS — E861 Hypovolemia: Secondary | ICD-10-CM | POA: Diagnosis present

## 2020-03-01 DIAGNOSIS — Z20822 Contact with and (suspected) exposure to covid-19: Secondary | ICD-10-CM | POA: Diagnosis present

## 2020-03-01 DIAGNOSIS — Z79899 Other long term (current) drug therapy: Secondary | ICD-10-CM

## 2020-03-01 DIAGNOSIS — S0011XA Contusion of right eyelid and periocular area, initial encounter: Secondary | ICD-10-CM | POA: Diagnosis present

## 2020-03-01 DIAGNOSIS — E860 Dehydration: Principal | ICD-10-CM | POA: Diagnosis present

## 2020-03-01 LAB — CBC
HCT: 37.8 % — ABNORMAL LOW (ref 39.0–52.0)
Hemoglobin: 11.2 g/dL — ABNORMAL LOW (ref 13.0–17.0)
MCH: 26.8 pg (ref 26.0–34.0)
MCHC: 29.6 g/dL — ABNORMAL LOW (ref 30.0–36.0)
MCV: 90.4 fL (ref 80.0–100.0)
Platelets: 182 10*3/uL (ref 150–400)
RBC: 4.18 MIL/uL — ABNORMAL LOW (ref 4.22–5.81)
RDW: 19.9 % — ABNORMAL HIGH (ref 11.5–15.5)
WBC: 12.4 10*3/uL — ABNORMAL HIGH (ref 4.0–10.5)
nRBC: 0.2 % (ref 0.0–0.2)

## 2020-03-01 LAB — URINALYSIS, ROUTINE W REFLEX MICROSCOPIC
Bilirubin Urine: NEGATIVE
Glucose, UA: NEGATIVE mg/dL
Ketones, ur: NEGATIVE mg/dL
Nitrite: NEGATIVE
Protein, ur: NEGATIVE mg/dL
Specific Gravity, Urine: 1.005 (ref 1.005–1.030)
pH: 7 (ref 5.0–8.0)

## 2020-03-01 LAB — BASIC METABOLIC PANEL
Anion gap: 10 (ref 5–15)
BUN: 25 mg/dL — ABNORMAL HIGH (ref 8–23)
CO2: 25 mmol/L (ref 22–32)
Calcium: 9.1 mg/dL (ref 8.9–10.3)
Chloride: 105 mmol/L (ref 98–111)
Creatinine, Ser: 1.06 mg/dL (ref 0.61–1.24)
GFR, Estimated: >60 mL/min (ref >60)
Glucose, Bld: 106 mg/dL — ABNORMAL HIGH (ref 70–99)
Potassium: 4.5 mmol/L (ref 3.5–5.1)
Sodium: 140 mmol/L (ref 135–145)

## 2020-03-01 MED ORDER — PREDNISONE 20 MG PO TABS: 20.0000 mg | ORAL_TABLET | Freq: Three times a day (TID) | ORAL | Status: DC

## 2020-03-01 MED ORDER — PANTOPRAZOLE SODIUM 40 MG PO TBEC: 40.0000 mg | DELAYED_RELEASE_TABLET | Freq: Every day | ORAL | Status: DC

## 2020-03-01 MED ORDER — LISINOPRIL 20 MG PO TABS: 40.0000 mg | ORAL_TABLET | Freq: Every day | ORAL | Status: DC

## 2020-03-01 MED ORDER — PYRIDOSTIGMINE BROMIDE 60 MG PO TABS
60.0000 mg | ORAL_TABLET | Freq: Once | ORAL | Status: AC
  Administered 2020-03-02: 60 mg via ORAL
  Filled 2020-03-01: qty 1

## 2020-03-01 NOTE — ED Provider Notes (Signed)
[place] EMERGENCY DEPARTMENT Provider Note   CSN: [id number] Arrival date & time: 03/01/20  1347     History No chief complaint on file.   Danny Walker is a 74 y.o. male.  Patient with PMH of myasthenia gravis with past admissions requiring IViG, presents to the ED with a chief complaint of lower extremity weakness.  He states that he fell about a week ago and hit his head.  He states that since falling, he has had weakness in his legs along with dizziness.  He is not anticoagulated.  He was also seen a few days ago and reports having had some droopy eyelids, but not to the point of when he has required admission in the past.  He states that he has not taken his daily meds because he has been in the ED.  He denies CP or SOB.  Denies fever or chills.  Denies any other associated symptoms.  The history is provided by the patient. No language interpreter was used.       Past Medical History:  Diagnosis Date  . BPH (benign prostatic hyperplasia)   . DDD (degenerative disc disease), lumbosacral   . Fatty liver   . Foley catheter in place   . History of gallstones   . History of prostatitis   . Hypertension   . IDA (iron deficiency anemia)   . Liver mass 09/17/2001   4.7cm , noted on [place] abd  . Low back pain   . Myasthenia gravis ([place])   . Nocturia   . 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 Spondylosis Lumbar  . Umbilical hernia   . Urinary retention 02/08/2018    Patient Active Problem List   Diagnosis Date Noted  . Myasthenia gravis with acute exacerbation ([place]) 01/24/2020  . Hip pain 09/30/2019  . Right shoulder pain 08/20/2019  . Health care maintenance 12/25/2018  . Gastroesophageal reflux disease 04/18/2018  . Essential hypertension 04/18/2018  . Protein-calorie malnutrition, severe 04/06/2018  . Myasthenia gravis, bulbar ([place]) 04/06/2018  . Bulbar weakness ([place]) 04/03/2018  . Unintentional weight loss  04/03/2018  . BPH (benign prostatic hyperplasia) 03/05/2018  . Umbilical hernia 64/40/3474    Past Surgical History:  Procedure Laterality Date  . ERCP W/ SPHICTEROTOMY  09/17/2001  . INSERTION OF MESH N/A 02/05/2016   Procedure: INSERTION OF MESH, UMBILICAL HERNIA;  Surgeon: Armandina Gemma, MD;  Location: [facility];  Service: General;  Laterality: N/A;  . LAPAROSCOPIC CHOLECYSTECTOMY  09/22/2001  . TRANSURETHRAL RESECTION OF PROSTATE N/A 03/05/2018   Procedure: TRANSURETHRAL RESECTION OF THE PROSTATE (TURP);  Surgeon: Cleon Gustin, MD;  Location[facility];  Service: Urology;  Laterality: N/A;  . UMBILICAL HERNIA REPAIR N/A 02/05/2016   Procedure: UMBILICAL HERNIA REPAIR WITH MESH PATCH;  Surgeon: Armandina Gemma, MD;  Location: [facility];  Service: General;  Laterality: N/A;       Family History  Problem Relation Age of Onset  . Hypertension Mother   . Hypertension Father     Social History   Tobacco Use  . Smoking status: Never Smoker  . Smokeless tobacco: Never Used  Vaping Use  . Vaping Use: Never used  Substance Use Topics  . Alcohol use: No  . Drug use: Never    Home Medications Prior to Admission medications   Medication Sig Start Date End Date Taking? Authorizing Provider  calcium-vitamin D (OSCAL 500/200 D-3)  500-200 MG-UNIT tablet Take 1 tablet by mouth 2 (two) times daily. 01/06/20   Narda Amber K, DO  diclofenac Sodium (VOLTAREN) 1 % GEL Apply 2 g topically 4 (four) times daily. Patient taking differently: Apply 2 g topically 4 (four) times daily as needed (pain).  12/16/19   Mosetta Anis, MD  fesoterodine (TOVIAZ) 4 MG TB24 tablet Take 4 mg by mouth daily.    [provider]  finasteride (PROSCAR) 5 MG tablet Take 1 tablet (5 mg total) by mouth every morning. 10/14/19   [name], MD  lisinopril (ZESTRIL) 40 MG tablet TOME UNA TABLETA TODOS LOS DIAS 12/16/19   Mosetta Anis, MD  naproxen  (NAPROSYN) 250 MG tablet TOME UNA TABLETA DOS VECES AL DIA CUANDO SEA [place] PARA EL DOLOR Patient taking differently: Take 250 mg by mouth daily as needed (pain).  01/14/20   [name], MD  omeprazole (PRILOSEC) 40 MG capsule TOME UNA CAPSULA TODOS LOS DIAS EN LA [place] Patient taking differently: Take 40 mg by mouth daily.  09/18/19   Narda Amber K, DO  polyvinyl alcohol (LIQUIFILM TEARS) 1.4 % ophthalmic solution Place 1 drop into both eyes as needed for dry eyes. 04/12/18   [place], Carley D, DO  predniSONE (DELTASONE) 20 MG tablet Take 1 tablet (20 mg total) by mouth 3 (three) times daily with meals. 02/20/20   Patel, Arvin Collard K, DO  pyridostigmine (MESTINON) 60 MG tablet Take 1 tablet (60 mg total) by mouth in the morning AND 1 tablet (60 mg total) daily with lunch AND 0.5 tablets (30 mg total) at bedtime. 01/29/20 02/28/20  Marianna Payment, MD    Allergies    Patient has no known allergies.  Review of Systems   Review of Systems  All other systems reviewed and are negative.   Physical Exam Updated Vital Signs BP 138/82 (BP Location: Right Arm)   Pulse 78   Temp 98.1 F (36.7 C) (Oral)   Resp 20   SpO2 99%   Physical Exam Vitals and nursing note reviewed.  Constitutional:      Appearance: He is well-developed.  HENT:     Head: Normocephalic and atraumatic.  Eyes:     Conjunctiva/sclera: Conjunctivae normal.  Cardiovascular:     Rate and Rhythm: Normal rate and regular rhythm.     Heart sounds: No murmur heard.   Pulmonary:     Effort: Pulmonary effort is normal. No respiratory distress.     Breath sounds: Normal breath sounds.  Abdominal:     Palpations: Abdomen is soft.     Tenderness: There is no abdominal tenderness.  Musculoskeletal:     Cervical back: Neck supple.  Skin:    General: Skin is warm and dry.  Neurological:     Mental Status: He is alert and oriented to person, place, and time.     Comments: 5/5 strength in upper and lower  extremities CN 3-12 intact Speech is clear Movements are goal oriented  Psychiatric:        Mood and Affect: Mood normal.        Behavior: Behavior normal.     ED Results / Procedures / Treatments   Labs (all labs ordered are listed, but only abnormal results are displayed) Labs Reviewed  BASIC METABOLIC PANEL - Abnormal; Notable for the following components:      Result Value   Glucose, Bld 106 (*)    BUN 25 (*)    All other components within  normal limits  CBC - Abnormal; Notable for the following components:   WBC 12.4 (*)    RBC 4.18 (*)    Hemoglobin 11.2 (*)    HCT 37.8 (*)    MCHC 29.6 (*)    RDW 19.9 (*)    All other components within normal limits  URINALYSIS, ROUTINE W REFLEX MICROSCOPIC - Abnormal; Notable for the following components:   Color, Urine STRAW (*)    Hgb urine dipstick SMALL (*)    Leukocytes,Ua TRACE (*)    Bacteria, UA FEW (*)    All other components within normal limits  RESPIRATORY PANEL BY RT PCR (FLU A&B, COVID)  CBG MONITORING, ED  TROPONIN I (HIGH SENSITIVITY)    EKG None  Radiology No results found.  Procedures Procedures (including critical care time)  Medications Ordered in ED Medications  pyridostigmine (MESTINON) tablet 60 mg (has no administration in time range)  predniSONE (DELTASONE) tablet 20 mg (has no administration in time range)  pantoprazole (PROTONIX) EC tablet 40 mg (has no administration in time range)  lisinopril (ZESTRIL) tablet 40 mg (has no administration in time range)    ED Course  I have reviewed the triage vital signs and the nursing notes.  Pertinent labs & imaging results that were available during my care of the patient were reviewed by me and considered in my medical decision making (see chart for details).    MDM Rules/Calculators/A&P                          This patient complains of lower extremity weakness bilaterally with associated fall, this involves an extensive number of treatment  options, and is a complaint that carries with it a high risk of complications and morbidity.    Differential Dx Myasthenia gravis, stroke, dehydration, electrolyte derangement  Pertinent Labs I ordered, reviewed, and interpreted labs, which included CBC, BMP, urinalysis, troponin, Covid notable for no electrolyte derangement, troponin is stable compared to 10/8.  Imaging Interpretation I ordered imaging studies which included chest x-ray and CT head, which showed no obvious abnormality.   Medications I ordered medication pyridostigmine and prednisone for myasthenia gravis.  Patient missed his dose earlier today.  Will defer dosing of possible IVIG to internal medicine.  Sources  Previous records obtained and reviewed recent admission in September for myasthenia flare depression.   Critical Interventions  None  Reassessments After the interventions stated above, I reevaluated the patient and found stable appearing.  Consultants Internal medicine residency service for admitting the patient.  Plan Plan for admission for lower extremity weakness, presumed myasthenia flare.    Final Clinical Impression(s) / ED Diagnoses Final diagnoses:  Bilateral leg weakness  Myasthenia [facility])    Rx / DC Orders ED Discharge Orders    None       Montine Circle, PA-C 03/02/20 0146    Palumbo, April, MD 03/02/20 [phone]

## 2020-03-01 NOTE — ED Notes (Signed)
Patient transported to CT 

## 2020-03-01 NOTE — ED Triage Notes (Signed)
Patient complains of ongoing dizziness and weakness since coming to ED last week and didn't want to stay. Patient alert and oriented. NAD

## 2020-03-02 ENCOUNTER — Emergency Department (HOSPITAL_COMMUNITY): Payer: Medicare Other

## 2020-03-02 ENCOUNTER — Encounter (HOSPITAL_COMMUNITY): Payer: Self-pay | Admitting: Internal Medicine

## 2020-03-02 ENCOUNTER — Observation Stay (HOSPITAL_COMMUNITY): Payer: Medicare Other

## 2020-03-02 DIAGNOSIS — Z9079 Acquired absence of other genital organ(s): Secondary | ICD-10-CM

## 2020-03-02 DIAGNOSIS — K760 Fatty (change of) liver, not elsewhere classified: Secondary | ICD-10-CM | POA: Diagnosis present

## 2020-03-02 DIAGNOSIS — I10 Essential (primary) hypertension: Secondary | ICD-10-CM | POA: Diagnosis present

## 2020-03-02 DIAGNOSIS — Z79899 Other long term (current) drug therapy: Secondary | ICD-10-CM | POA: Diagnosis not present

## 2020-03-02 DIAGNOSIS — R55 Syncope and collapse: Secondary | ICD-10-CM

## 2020-03-02 DIAGNOSIS — R42 Dizziness and giddiness: Secondary | ICD-10-CM | POA: Diagnosis not present

## 2020-03-02 DIAGNOSIS — Z23 Encounter for immunization: Secondary | ICD-10-CM | POA: Diagnosis present

## 2020-03-02 DIAGNOSIS — Y92002 Bathroom of unspecified non-institutional (private) residence single-family (private) house as the place of occurrence of the external cause: Secondary | ICD-10-CM | POA: Diagnosis not present

## 2020-03-02 DIAGNOSIS — Z20822 Contact with and (suspected) exposure to covid-19: Secondary | ICD-10-CM | POA: Diagnosis present

## 2020-03-02 DIAGNOSIS — Z7952 Long term (current) use of systemic steroids: Secondary | ICD-10-CM | POA: Diagnosis not present

## 2020-03-02 DIAGNOSIS — W182XXA Fall in (into) shower or empty bathtub, initial encounter: Secondary | ICD-10-CM | POA: Diagnosis present

## 2020-03-02 DIAGNOSIS — S0011XA Contusion of right eyelid and periocular area, initial encounter: Secondary | ICD-10-CM | POA: Diagnosis present

## 2020-03-02 DIAGNOSIS — R778 Other specified abnormalities of plasma proteins: Secondary | ICD-10-CM

## 2020-03-02 DIAGNOSIS — N400 Enlarged prostate without lower urinary tract symptoms: Secondary | ICD-10-CM

## 2020-03-02 DIAGNOSIS — Z9181 History of falling: Secondary | ICD-10-CM

## 2020-03-02 DIAGNOSIS — E861 Hypovolemia: Secondary | ICD-10-CM | POA: Diagnosis present

## 2020-03-02 DIAGNOSIS — G7000 Myasthenia gravis without (acute) exacerbation: Secondary | ICD-10-CM

## 2020-03-02 DIAGNOSIS — R531 Weakness: Secondary | ICD-10-CM | POA: Diagnosis present

## 2020-03-02 DIAGNOSIS — E860 Dehydration: Secondary | ICD-10-CM | POA: Diagnosis present

## 2020-03-02 DIAGNOSIS — R296 Repeated falls: Secondary | ICD-10-CM | POA: Diagnosis present

## 2020-03-02 DIAGNOSIS — D6489 Other specified anemias: Secondary | ICD-10-CM | POA: Diagnosis present

## 2020-03-02 DIAGNOSIS — K219 Gastro-esophageal reflux disease without esophagitis: Secondary | ICD-10-CM | POA: Diagnosis present

## 2020-03-02 LAB — BASIC METABOLIC PANEL
Anion gap: 11 (ref 5–15)
BUN: 26 mg/dL — ABNORMAL HIGH (ref 8–23)
CO2: 26 mmol/L (ref 22–32)
Calcium: 8.8 mg/dL — ABNORMAL LOW (ref 8.9–10.3)
Chloride: 103 mmol/L (ref 98–111)
Creatinine, Ser: 0.86 mg/dL (ref 0.61–1.24)
GFR, Estimated: >60 mL/min (ref >60)
Glucose, Bld: 104 mg/dL — ABNORMAL HIGH (ref 70–99)
Potassium: 4.4 mmol/L (ref 3.5–5.1)
Sodium: 140 mmol/L (ref 135–145)

## 2020-03-02 LAB — CBC
HCT: 40.2 % (ref 39.0–52.0)
Hemoglobin: 12.1 g/dL — ABNORMAL LOW (ref 13.0–17.0)
MCH: 28 pg (ref 26.0–34.0)
MCHC: 30.1 g/dL (ref 30.0–36.0)
MCV: 93.1 fL (ref 80.0–100.0)
Platelets: 170 10*3/uL (ref 150–400)
RBC: 4.32 MIL/uL (ref 4.22–5.81)
RDW: 20.4 % — ABNORMAL HIGH (ref 11.5–15.5)
WBC: 10.2 10*3/uL (ref 4.0–10.5)
nRBC: 0.2 % (ref 0.0–0.2)

## 2020-03-02 LAB — RESPIRATORY PANEL BY RT PCR (FLU A&B, COVID)
Influenza A by PCR: NEGATIVE
Influenza B by PCR: NEGATIVE
SARS Coronavirus 2 by RT PCR: NEGATIVE

## 2020-03-02 LAB — ECHOCARDIOGRAM COMPLETE
Area-P 1/2: 2.32 cm2
S' Lateral: 1.7 cm

## 2020-03-02 LAB — TROPONIN I (HIGH SENSITIVITY)
Troponin I (High Sensitivity): 29 ng/L — ABNORMAL HIGH (ref <18)
Troponin I (High Sensitivity): 27 ng/L — ABNORMAL HIGH (ref <18)

## 2020-03-02 MED ORDER — LACTATED RINGERS IV SOLN: INTRAVENOUS | Status: DC

## 2020-03-02 MED ORDER — PNEUMOCOCCAL VAC POLYVALENT 25 MCG/0.5ML IJ INJ
0.5000 mL | INJECTION | INTRAMUSCULAR | Status: AC
  Administered 2020-03-03: 0.5 mL via INTRAMUSCULAR
  Filled 2020-03-02: qty 0.5

## 2020-03-02 MED ORDER — PANTOPRAZOLE SODIUM 40 MG PO TBEC
40.0000 mg | DELAYED_RELEASE_TABLET | Freq: Every day | ORAL | Status: DC
  Administered 2020-03-02 – 2020-03-03 (×2): 40 mg via ORAL
  Filled 2020-03-02 (×2): qty 1

## 2020-03-02 MED ORDER — ENOXAPARIN SODIUM 40 MG/0.4ML ~~LOC~~ SOLN
40.0000 mg | Freq: Every day | SUBCUTANEOUS | Status: DC
  Administered 2020-03-02 – 2020-03-03 (×2): 40 mg via SUBCUTANEOUS
  Filled 2020-03-02 (×2): qty 0.4

## 2020-03-02 MED ORDER — INFLUENZA VAC A&B SA ADJ QUAD 0.5 ML IM PRSY
0.5000 mL | PREFILLED_SYRINGE | INTRAMUSCULAR | Status: AC
  Administered 2020-03-03: 0.5 mL via INTRAMUSCULAR
  Filled 2020-03-02: qty 0.5

## 2020-03-02 MED ORDER — FINASTERIDE 5 MG PO TABS: 5.0000 mg | ORAL_TABLET | ORAL | Status: DC

## 2020-03-02 MED ORDER — LISINOPRIL 20 MG PO TABS
40.0000 mg | ORAL_TABLET | Freq: Every day | ORAL | Status: DC
  Administered 2020-03-02 – 2020-03-03 (×2): 40 mg via ORAL
  Filled 2020-03-02 (×2): qty 2

## 2020-03-02 MED ORDER — FINASTERIDE 5 MG PO TABS
5.0000 mg | ORAL_TABLET | Freq: Every day | ORAL | Status: DC
  Administered 2020-03-03: 5 mg via ORAL
  Filled 2020-03-02 (×2): qty 1

## 2020-03-02 MED ORDER — PYRIDOSTIGMINE BROMIDE 60 MG PO TABS
60.0000 mg | ORAL_TABLET | Freq: Two times a day (BID) | ORAL | Status: DC
  Administered 2020-03-02 – 2020-03-03 (×4): 60 mg via ORAL
  Filled 2020-03-02 (×7): qty 1

## 2020-03-02 MED ORDER — PREDNISONE 20 MG PO TABS
20.0000 mg | ORAL_TABLET | Freq: Three times a day (TID) | ORAL | Status: DC
  Administered 2020-03-02 – 2020-03-03 (×6): 20 mg via ORAL
  Filled 2020-03-02 (×6): qty 1

## 2020-03-02 MED ORDER — PYRIDOSTIGMINE BROMIDE 60 MG PO TABS
30.0000 mg | ORAL_TABLET | Freq: Every day | ORAL | Status: DC
  Administered 2020-03-02 – 2020-03-03 (×2): 30 mg via ORAL
  Filled 2020-03-02 (×3): qty 0.5

## 2020-03-02 MED ORDER — FESOTERODINE FUMARATE ER 4 MG PO TB24
4.0000 mg | ORAL_TABLET | Freq: Every day | ORAL | Status: DC
  Administered 2020-03-02 – 2020-03-03 (×2): 4 mg via ORAL
  Filled 2020-03-02 (×2): qty 1

## 2020-03-02 MED ORDER — FESOTERODINE FUMARATE ER 4 MG PO TB24: 4.0000 mg | ORAL_TABLET | Freq: Every day | ORAL | Status: DC

## 2020-03-02 MED ORDER — CALCIUM CARBONATE-VITAMIN D 500-200 MG-UNIT PO TABS
1.0000 | ORAL_TABLET | Freq: Two times a day (BID) | ORAL | Status: DC
  Administered 2020-03-02 – 2020-03-03 (×3): 1 via ORAL
  Filled 2020-03-02 (×5): qty 1

## 2020-03-02 NOTE — Hospital Course (Addendum)
#  Fall Patient presented to [facility] following a fall in his bathtub several days ago secondary to leg weakness, striking his head. Surrounding the event he reported some dizziness which he describes as a lightheaded sensation particularly with exertion. Initially had some bruising around his R periorbital area which he states is improving. Had some pain there which is also improved. No objective neurologic abnormalities on exam. CT Head negative. Following admission, patient was noted to have positive orthostatics (heart rate increased from 70 to 100 upon standing). He received 1L IVF overnight with plan to recheck orthostatics this morning. Occupational therapy evaluated patient and reported that patient does not have physical or occupational therapy needs at this time. Neurology signed off as determined not to be a myasthenic crisis and unremarkable neurological examination and workup.   #Myasthenia gravis Patient with history of myasthenia gravis originally diagnosed in 2019 admitted to IMTS for concern for myasthenia gravis flare. He has had a prior admission for a flare this year with difficulty swallowing and improved with IVIG. Since discharge, he has been following closely with Dr. Posey Pronto with neurology for management of his condition. Most recently, he reports taking pyridostigmine 60mg  in the morning and afternoon, and 30mg  pyridostigmine in the evening. His daughter reported that he had been adherent to prednisone 20mg  three times daily. He presented for this admission for worsening bilateral lower extremity weakness resulting in a fall several days ago. On physical examination, patient does not appear to have objective weakness and his neuro exam was benign. No clear source of a myasthenia gravis flare at this time, such as infection. We restarted pyridostigmine and prednisone. Neurology was consulted and does not feel that patient's presentation is consistent with a myasthenia flare, and signed off with  plan to follow-up in outpatient setting. He was continued on his home regimen throughout hospitalization.   #Elevated troponin At prior ED presentation on 10/3 had troponin of 30 > 27. At that time, EKG had changes in T waves compared to prior (tall T waves, new inversions). Asymptomatic. Cardiology reviewed EKG and ruled out STEMI, recommended overnight stay and serial troponins but patient left. On this admission, continues to be asymptomatic, EKG similar to that on 10/3. Troponin 29 then 27. Echocardiogram obtained which shows no significant abnormality. Recommended cardiology follow-up in outpatient setting for which he needs referral.   #Normocytic anemia Patient initially presented with mild normocytic anemia. Hemoglobin down to 9.8 on day of discharge. He will require workup for his normocytic anemia in the outpatient setting which may contribute to his sensation of weakness and lightheadedness.

## 2020-03-02 NOTE — Progress Notes (Signed)
OT Cancellation Note  Patient Details Name: Isac Lincks MRN: [id number] DOB: [birth year]   Cancelled Treatment:    Reason Eval/Treat Not Completed: Other (comment). Pt in process of being transported to a room on 3w, will see him at a later time for eval.   Golden Circle, OTR/L Acute Rehab Services Pager [phone] Office [phone]     Almon Register 03/02/2020, 12:07 PM

## 2020-03-02 NOTE — ED Notes (Signed)
Attempted to call nursing report to 3w

## 2020-03-02 NOTE — Progress Notes (Signed)
PT Cancellation Note  Patient Details Name: Najee Manninen MRN: [id number] DOB: [birth year]   Cancelled Treatment:    Reason Eval/Treat Not Completed: Other (comment) Admission RN present in room. Will follow up as schedule allows.   Lou Miner, DPT  Acute Rehabilitation Services  Pager: [phone] Office: [phone]    Rudean Hitt 03/02/2020, 3:14 PM

## 2020-03-02 NOTE — Progress Notes (Signed)
Subjective:   Danny Walker. [place] is a 74 year old male with past medical history significant for myasthenia gravis, HTN, GERD, and BPH who presented to [facility] for evaluation of bilateral lower extremity weakness and recent found to be secondary to myasthenia gravis flare.  Overnight, no further acute events.  This morning, patient reports that he does not feel weak or dizzy while laying in bed this morning. He states that the weakness and dizziness, which he describes as a sensation of lightheadedness, occur predominantly with walking. He states that he has been eating and drinking appropriately at home without difficulty. He states that his eyes feel heavy and that he is tired. He denies chest pain, shortness of breath, abdominal pain, nausea, vomiting, diarrhea.  Objective:  Vital signs in last 24 hours: Vitals:   03/02/20 0600 03/02/20 0730 03/02/20 0800 03/02/20 0930  BP: 118/77 125/83 131/72 116/72  Pulse: 62 69 87 72  Resp: 16 17 14 15   Temp:      TempSrc:      SpO2: 97% 96% 96% 95%  SpO2: 95 % There were no vitals filed for this visit.  Intake/Output Summary (Last 24 hours) at 03/02/2020 1119 Last data filed at 03/02/2020 0847 Gross per 24 hour  Intake --  Output 225 ml  Net -225 ml    Physical Exam Constitutional:      General: He is not in acute distress.    Appearance: Normal appearance.  HENT:     Head: Normocephalic and atraumatic.     Mouth/Throat:     Mouth: Mucous membranes are moist.     Pharynx: Oropharynx is clear.  Eyes:     Extraocular Movements: Extraocular movements intact.     Conjunctiva/sclera: Conjunctivae normal.  Cardiovascular:     Rate and Rhythm: Normal rate and regular rhythm.     Pulses: Normal pulses.     Heart sounds: Normal heart sounds.  Pulmonary:     Effort: Pulmonary effort is normal.     Breath sounds: Normal breath sounds.  Abdominal:     General: Abdomen is flat. Bowel sounds are normal.     Palpations: Abdomen is soft.      Tenderness: There is no abdominal tenderness.  Musculoskeletal:        General: Normal range of motion.     Cervical back: Normal range of motion and neck supple.     Right lower leg: No edema.     Left lower leg: No edema.  Skin:    General: Skin is warm and dry.     Capillary Refill: Capillary refill takes less than 2 seconds.  Neurological:     Mental Status: He is alert and oriented to person, place, and time. Mental status is at baseline.     Cranial Nerves: Cranial nerves are intact. No cranial nerve deficit.     Sensory: Sensation is intact. No sensory deficit.     Motor: No weakness.     Deep Tendon Reflexes: Reflexes normal.  Psychiatric:        Mood and Affect: Mood normal.        Behavior: Behavior normal.        Thought Content: Thought content normal.        Judgment: Judgment normal.    CBC Latest Ref Rng & Units 03/02/2020 03/01/2020 02/28/2020  WBC 4.0 - 10.5 K/uL 10.2 12.4(H) 13.1(H)  Hemoglobin 13.0 - 17.0 g/dL 12.1(L) 11.2(L) 11.3(L)  Hematocrit 39 - 52 % 40.2 37.8(L) 37.2(L)  Platelets 150 - 400 K/uL 170 182 177   CMP Latest Ref Rng & Units 03/02/2020 03/01/2020 02/28/2020  Glucose 70 - 99 mg/dL 104(H) 106(H) 165(H)  BUN 8 - 23 mg/dL 26(H) 25(H) 30(H)  Creatinine 0.61 - 1.24 mg/dL 0.86 1.06 1.08  Sodium 135 - 145 mmol/L 140 140 140  Potassium 3.5 - 5.1 mmol/L 4.4 4.5 4.3  Chloride 98 - 111 mmol/L 103 105 104  CO2 22 - 32 mmol/L 26 25 27   Calcium 8.9 - 10.3 mg/dL 8.8(L) 9.1 8.9  Total Protein 6.0 - 8.5 g/dL - - -  Total Bilirubin 0.0 - 1.2 mg/dL - - -  Alkaline Phos 44 - 121 IU/L - - -  AST 0 - 40 IU/L - - -  ALT 0 - 44 IU/L - - -   Assessment/Plan:  Principal Problem:   Myasthenia gravis (HCC) Active Problems:   Gastroesophageal reflux disease   Essential hypertension  [name]. Kun is a 74 year old male with past medical history significant for myasthenia gravis, HTN, GERD, and BPH who presented to [facility] for evaluation of bilateral  lower extremity weakness and recent found to be secondary to myasthenia gravis flare.  Myasthenia Gravis, active Patient with history of myasthenia gravis originally diagnosed in 2019 admitted to IMTS for concern for myasthenia gravis flare. He has had a prior admission for a flare this year with difficulty swallowing and improved with IVIG. Since discharge, he has been following closely with Dr. Posey Pronto with neurology for management of his condition. Most recently, he reports taking pyridostigmine 60mg  in the morning and afternoon, and 30mg  pyridostigmine in the evening. He reports receiving prednisone 60mg  daily following discharge, however he reports that he has been taking 10mg  tablet of prednisone in the morning and 10mg  tablet of prednisone in the afternoon. He presented for this admission for worsening bilateral lower extremity weakness resulting in a fall several days ago. On physical examination, patient does not appear to have objective weakness and his neuro exam was benign. No clear source of a myasthenia gravis flare at this time, such as infection. We will restart home pyridostigmine and prednisone and consult neurology for further recommendations. -Prednisone 20mg  TID with meals -Continue home mestinon 60mg  in am, 60mg  at noon, 30mg  in pm -Holding off on IVIG at this time due to lack of objective weakness -Neurology consulted, appreciate recommendations -PT/OT eval and treat  Fall with subsequent dizziness, active Reported a fall in his bathtub 5 days 2/2 leg weakness, striking his head. Since then has had some dizziness which he describes as a lightheaded sensation particularly with exertion. Has some bruising around his R periorbital area which he states is improving. Had some pain there which is also improved. As above, no objective neurologic abnormalities on exam. CT Head negative. -Echocardiogram, complete -Orthostatics  Elevated troponin, active, stable At last ED presentation on  10/3 had troponin of 30 > 27. At that time, EKG had changes in T waves compared to prior (tall T waves, new inversions). Asymptomatic. Cardiology reviewed EKG and ruled out STEMI, recommended overnight stay and serial troponins but patient left. Today continues to be asymptomatic, EKG similar to that on 10/3. Troponin 29 then 27.  -Continue to monitor  HTN, chronic Well controlled at this time on home regimen. -continue home lisinopril 40mg   GERD, chronic In the setting of long term steroid use. -continue protonix 40mg   BPH, chronic Reports well controlled with transurethral resection of prostate in 2019 and medications. -continue home finasteride  5mg  and fesoterodine 4mg   Code status: Partial Diet: Heart Healthy VTE ppx: Lovenox IVF: None Bowel regimen: None  Cato Mulligan, MD 03/02/2020, 11:19 AM Pager: [phone] After 5pm on weekdays and 1pm on weekends: On Call pager [phone]

## 2020-03-02 NOTE — ED Notes (Signed)
Visitor at bedside.

## 2020-03-02 NOTE — Consult Note (Signed)
Neurology Consultation  Reason for Consult: Lower extremity weakness with question of myasthenia exacerbation Referring Physician: Dr. Donalee Citrin, MD  CC: Lower extremity weakness  History is obtained from: Patient  HPI: Danny Walker is a 74 y.o. 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  Patient comes the hospital secondary to feeling he has lower extremity weakness in the last 2 days he states that he is unable to walk.  Approximately 10 days ago patient was lifting his leg to get into the bathtub when he felt dizzy and fell.  When he fell he did hit his head.  At that time he noted that he did have weakness in his legs but the weakness improved over time.  He does state that he had some weakness prior to the fall but after the fall the weakness was definitely more prominent.  As time went on he noted he became more weak.  He states he does not get worse with continuous walking.  The weakness is just there.  He rates the weakness in his lower extremity of 7/10.  He states of the last few days he has not been able to walk.  He denies any pain.  He denies any symptoms of being ill.  The majority of his myasthenia in the past has been swallowing however he states that that is fully resolved and he is doing well.    Past Medical History:  Diagnosis Date  . BPH (benign prostatic hyperplasia)   . DDD (degenerative disc disease), lumbosacral   . Fatty liver   . Foley catheter in place   . History of gallstones   . History of prostatitis   . Hypertension   . IDA (iron deficiency anemia)   . Liver mass 09/17/2001   4.7cm , noted on [place] abd  . Low back pain   . Myasthenia gravis ([place])   . Nocturia   . 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 Spondylosis Lumbar  . Umbilical hernia   . Urinary retention 02/08/2018    Family History  Problem Relation Age of Onset   . Hypertension Mother   . Hypertension Father    Social History:   reports that he has never smoked. He has never used smokeless tobacco. He reports that he does not drink alcohol and does not use drugs.  Medications  Current Facility-Administered Medications:  .  calcium-vitamin D (OSCAL WITH D) 500-200 MG-UNIT per tablet 1 tablet, 1 tablet, Oral, BID WC, Sherry Ruffing, Marissa M, MD .  enoxaparin (LOVENOX) injection 40 mg, 40 mg, Subcutaneous, Daily, Sherry Ruffing, Marissa M, MD, 40 mg at 03/02/20 1150 .  fesoterodine (TOVIAZ) tablet 4 mg, 4 mg, Oral, Daily, Sherry Ruffing, Marissa M, MD, 4 mg at 03/02/20 0422 .  finasteride (PROSCAR) tablet 5 mg, 5 mg, Oral, Daily, Sherry Ruffing, Marissa M, MD .  lactated ringers infusion, , Intravenous, Continuous, Madalyn Rob, MD .  lisinopril (ZESTRIL) tablet 40 mg, 40 mg, Oral, Daily, [name], MD, 40 mg at 03/02/20 0421 .  pantoprazole (PROTONIX) EC tablet 40 mg, 40 mg, Oral, Daily, [name], MD, 40 mg at 03/02/20 0422 .  predniSONE (DELTASONE) tablet 20 mg, 20 mg, Oral, TID WC, [name], MD, 20 mg at 03/02/20 1150 .  pyridostigmine (MESTINON) tablet 30 mg, 30 mg, Oral, Q supper, Sherry Ruffing, Adela Lank, MD .  pyridostigmine (MESTINON)  tablet 60 mg, 60 mg, Oral, BID WC, [name], MD, 60 mg at 03/02/20 0901  Current Outpatient Medications:  .  calcium-vitamin D (OSCAL 500/200 D-3) 500-200 MG-UNIT tablet, Take 1 tablet by mouth 2 (two) times daily., Disp: 60 tablet, Rfl: 11 .  diclofenac Sodium (VOLTAREN) 1 % GEL, Apply 2 g topically 4 (four) times daily. (Patient taking differently: Apply 2 g topically 4 (four) times daily as needed (pain). ), Disp: 100 g, Rfl: 3 .  fesoterodine (TOVIAZ) 4 MG TB24 tablet, Take 4 mg by mouth daily., Disp: , Rfl:  .  lisinopril (ZESTRIL) 40 MG tablet, TOME UNA TABLETA TODOS LOS DIAS (Patient taking differently: Take 40 mg by mouth daily. TOME UNA TABLETA TODOS LOS DIAS), Disp: 90 tablet, Rfl: 3 .  naproxen  (NAPROSYN) 250 MG tablet, TOME UNA TABLETA DOS VECES AL DIA CUANDO SEA NECESARIO PARA EL DOLOR (Patient taking differently: Take 250 mg by mouth daily as needed (pain). ), Disp: 28 tablet, Rfl: 0 .  omeprazole (PRILOSEC) 40 MG capsule, TOME UNA CAPSULA TODOS LOS DIAS EN LA MANANA (Patient taking differently: Take 40 mg by mouth daily. ), Disp: 90 capsule, Rfl: 3 .  polyvinyl alcohol (LIQUIFILM TEARS) 1.4 % ophthalmic solution, Place 1 drop into both eyes as needed for dry eyes., Disp: 15 mL, Rfl: 0 .  predniSONE (DELTASONE) 20 MG tablet, Take 1 tablet (20 mg total) by mouth 3 (three) times daily with meals. (Patient taking differently: Take 20 mg by mouth 2 (two) times daily with a meal. ), Disp: 90 tablet, Rfl: 2 .  pyridostigmine (MESTINON) 60 MG tablet, Take 1 tablet (60 mg total) by mouth in the morning AND 1 tablet (60 mg total) daily with lunch AND 0.5 tablets (30 mg total) at bedtime., Disp: 75 tablet, Rfl: 0 .  finasteride (PROSCAR) 5 MG tablet, Take 1 tablet (5 mg total) by mouth every morning. (Patient not taking: Reported on 03/02/2020), Disp: 90 tablet, Rfl: 3  ROS:   General ROS: negative for - chills, fatigue, fever, night sweats, weight gain or weight loss Psychological ROS: negative for - behavioral disorder, hallucinations, memory difficulties, mood swings or suicidal ideation Ophthalmic ROS: negative for - blurry vision, double vision, eye pain or loss of vision ENT ROS: negative for - epistaxis, nasal discharge, oral lesions, sore throat, tinnitus or vertigo Allergy and Immunology ROS: negative for - hives or itchy/watery eyes Hematological and Lymphatic ROS: negative for - bleeding problems, bruising or swollen lymph nodes Endocrine ROS: negative for - galactorrhea, hair pattern changes, polydipsia/polyuria or temperature intolerance Respiratory ROS: negative for - cough, hemoptysis, shortness of breath or wheezing Cardiovascular ROS: negative for - chest pain, dyspnea on  exertion, edema or irregular heartbeat Gastrointestinal ROS: negative for - abdominal pain, diarrhea, hematemesis, nausea/vomiting or stool incontinence Genito-Urinary ROS: negative for - dysuria, hematuria, incontinence or urinary frequency/urgency Musculoskeletal ROS: negative for - joint swelling or muscular weakness Neurological ROS: as noted in HPI Dermatological ROS: negative for rash and skin lesion changes  Exam: Current vital signs: BP 131/85   Pulse 70   Temp 98.1 F (36.7 C) (Oral)   Resp 18   SpO2 96%  Vital signs in last 24 hours: Temp:  [98.1 F (36.7 C)-98.4 F (36.9 C)] 98.1 F (36.7 C) (10/10 1952) Pulse Rate:  [57-87] 70 (10/11 1300) Resp:  [10-20] 18 (10/11 1300) BP: (112-138)/(72-85) 131/85 (10/11 1300) SpO2:  [95 %-100 %] 96 % (10/11 1300)   Constitutional: Appears well-developed and well-nourished.  Psych: Affect appropriate to situation Eyes: No scleral injection HENT: No OP obstrucion Head: Normocephalic.  Cardiovascular: Normal rate and regular rhythm.  Respiratory: Effort normal, non-labored breathing GI: Soft.  No distension. There is no tenderness.  Skin: WDI  Neuro: Mental Status: Patient is awake, alert, oriented to person, place, month, year, and situation. Speech-shows no dysarthria or aphasia Patient is able to give a clear and coherent history. Cranial Nerves: II: Visual Fields are full.  III,IV, VI: EOMI without ptosis or diploplia. Pupils equal, round and reactive to light V: Facial sensation is symmetric to temperature VII: Facial movement is symmetric.  VIII: hearing is intact to voice X: Palat elevates symmetrically XI: Shoulder shrug is symmetric. XII: tongue is midline without atrophy or fasciculations.  -Neck flexion extension 5/5.  No facial diplegia Motor: Tone is normal. Bulk is normal. 5/5 strength was present in all four extremities.  He does however have 4/5 with a right shoulder abduction. Sensory: Sensation is  symmetric to light touch and temperature in the arms and legs. DSS Plantars: Toes are downgoing bilaterally.  Cerebellar: FNF and HKS are intact bilaterally  Labs I have reviewed labs in epic and the results pertinent to this consultation are:   CBC    Component Value Date/Time   WBC 10.2 03/02/2020 0217   RBC 4.32 03/02/2020 0217   HGB 12.1 (L) 03/02/2020 0217   HGB 11.1 (L) 02/03/2020 1504   HCT 40.2 03/02/2020 0217   HCT 36.1 (L) 02/03/2020 1504   PLT 170 03/02/2020 0217   PLT 223 02/03/2020 1504   MCV 93.1 03/02/2020 0217   MCV 84 02/03/2020 1504   MCH 28.0 03/02/2020 0217   MCHC 30.1 03/02/2020 0217   RDW 20.4 (H) 03/02/2020 0217   RDW 19.3 (H) 02/03/2020 1504   LYMPHSABS 0.6 (L) 01/24/2020 1534   MONOABS 0.4 01/24/2020 1534   EOSABS 0.0 01/24/2020 1534   BASOSABS 0.0 01/24/2020 1534    CMP     Component Value Date/Time   NA 140 03/02/2020 0217   NA 142 02/03/2020 1504   K 4.4 03/02/2020 0217   CL 103 03/02/2020 0217   CO2 26 03/02/2020 0217   GLUCOSE 104 (H) 03/02/2020 0217   BUN 26 (H) 03/02/2020 0217   BUN 31 (H) 02/03/2020 1504   CREATININE 0.86 03/02/2020 0217   CREATININE 0.77 01/01/2013 1045   CALCIUM 8.8 (L) 03/02/2020 0217   PROT 7.5 02/03/2020 1504   ALBUMIN 3.7 02/03/2020 1504   AST 25 02/03/2020 1504   ALT 30 02/03/2020 1504   ALKPHOS 49 02/03/2020 1504   BILITOT 0.2 02/03/2020 1504   GFRNONAA >60 03/02/2020 0217   GFRAA 84 02/03/2020 1504     Imaging I have reviewed the images obtained:  CT-scan of the brain no acute intracranial hemorrhage or infarct   Etta Quill PA-C [facility] [phone]  M-F  (9:00 am- 5:00 PM)  03/02/2020, 1:29 PM     Assessment:  This is a 74 year old male with known myasthenia gravis.  The majority of his symptoms have to do with ocular and swallowing.  Patient is noted arrives to the hospital on this occasion secondary to progressive lower extremity weakness which has now over the  last 2 days progressed to the point he believes he cannot walk.  Exam however does not show any weakness throughout lower extremities and or upper extremities with the exception of right shoulder abduction which still is a 4/5.  At this point given the strength on  exam I do not believe that this is a myasthenic exacerbation.  In addition given his strength I do not know why patient is stating he cannot walk.  There could be a component of fear secondary post fall.   Impression: -Weakness of unknown origin however not a myasthenic exacerbation   Recommendations: -Patient needs physical therapy   Drugs to avoid if you have Myasthenia Gravis:   Many drugs have been reported to have adverse effects in patients with MG (see below). However, not all patients react adversely to all these drugs. Conversely, not all "safe" drugs can be used with impunity in patients with MG.As a rule, the listed drugs should be avoided whenever possible, and patients with MG should be followed closely when any new drug is introduced.  Drugs that may exacerbate MG  Antibiotics  Aminoglycosides: e.g., streptomycin, tobramycin, kanamycin  Quinolones: e.g., ciprofloxacin, levofloxacin, ofloxacin, gatifloxacin  Macrolides: e.g., erythromycin, azithromycin, telithromycin  Nondepolarizing muscle relaxants for surgery  d-Tubocurarine (curare), pancuronium, vecuronium, atracurium  Beta-blocking agents  Propranolol, atenolol, metoprolol  Local anesthetics and related agents  Procaine, Xylocaine in large amounts  Procainamide (for arrhythmias)  Botulinum toxin  Botox exacerbates weakness.  Quinine derivatives  Quinine, quinidine, chloroquine, mefloquine ([place]am)  Magnesium  Decreases ACh release  Penicillamine  May cause MG  Drugs with important interactions in MG  Cyclosporine  Broad range of drug interactions, which may raise or lower cyclosporine levels  Azathioprine  Avoid allopurinol; combination may result in  myelosuppression.

## 2020-03-02 NOTE — H&P (Addendum)
Date: 03/02/2020               Patient Name:  Danny Walker MRN: [id number]  DOB: 09/04/1945 Age / Sex: 74 y.o., male   PCP: [name], MD         Medical Service: Internal Medicine Teaching Service         Attending Physician: Dr. Aldine Contes    First Contact: Dr. Wynetta Emery Pager: [phone]  Second Contact: Dr. Court Joy Pager: [phone]       After Hours (After 5p/  First Contact Pager: [phone]  weekends / holidays): Second Contact Pager: [phone]   Chief Complaint: LE weakness  History of Present Illness: This is a 74 year old male with a history of mysthenia gravis, HTN, GERD, and BPH presenting for bilateral lower extremity weakness ongoing for about 5 days. He feels like he can't walk, he had to hold on to something to get around. Reports compliance to taking his medications at home. States that he fell about 5 days ago in the bathtub due to the weakness and struck his head. Has had some dizziness since the fall, but no headaches. Notes some bruising around his eye which has improved. Otherwise feels in his usual state of health. Denies difficulty swallowing, vision changes, fever, chills.   He was seen at this ED on 10/8 for the same symptoms, seen by neuro who did not feel that he needed IVIG given lack of objective weakness. Patient and daughter elected for f/u with their neurologist and possible outpatient IVIG course. Since then, the patient states his symptoms have been worsening so he came to the ED.   At last ED visit, he had troponin of 30 > 27 and cardiology recommended overnight observation and serial labs, but he left AMA. No CP or palpitations at that time. Today also denies chest pain, SOB, n/v, diaphoresis.  His myasthenia gravis was first diagnosed in 2019 and was stable on Mestinon for about 2 years. He was hospitalized from 9/3 to 9/8 for myasthenia gravis flare with difficulty swallowing, had a 5 day course of IVIG with significant improvement. Was  discharged on prednisone 60mg  and mestinon 60mg  at 7am, 60mg  at 12pm, and 30mg  at 3pm. At outpatient follow up, his neurologist Dr. Posey Pronto started taper of his prednisone to 40mg  after 1 week. He presented back to neurology with worsening of weakness, so prednisone was increased back to 60mg . Per patient, he has been taking 20mg  at breakfast and lunch, but none at dinner.    Meds:  Current Outpatient Medications  Medication Instructions  . calcium-vitamin D (OSCAL 500/200 D-3) 500-200 MG-UNIT tablet 1 tablet, Oral, 2 times daily  . diclofenac Sodium (VOLTAREN) 2 g, Topical, 4 times daily  . fesoterodine (TOVIAZ) 4 mg, Oral, Daily  . finasteride (PROSCAR) 5 mg, Oral, BH-each morning  . lisinopril (ZESTRIL) 40 MG tablet TOME UNA TABLETA TODOS LOS DIAS  . naproxen (NAPROSYN) 250 MG tablet TOME UNA TABLETA DOS VECES AL DIA CUANDO SEA NECESARIO PARA EL DOLOR  . omeprazole (PRILOSEC) 40 MG capsule TOME UNA CAPSULA TODOS LOS DIAS EN LA [place]  . polyvinyl alcohol (LIQUIFILM TEARS) 1.4 % ophthalmic solution 1 drop, Both Eyes, As needed  . predniSONE (DELTASONE) 20 mg, Oral, 3 times daily with meals  . pyridostigmine (MESTINON) 60 MG tablet Take 1 tablet (60 mg total) by mouth in the morning AND 1 tablet (60 mg total) daily with lunch AND 0.5 tablets (30 mg total)  at bedtime.     Allergies: Allergies as of 03/01/2020  . (No Known Allergies)   Past Medical History:  Diagnosis Date  . BPH (benign prostatic hyperplasia)   . DDD (degenerative disc disease), lumbosacral   . Fatty liver   . Foley catheter in place   . History of gallstones   . History of prostatitis   . Hypertension   . IDA (iron deficiency anemia)   . Liver mass 09/17/2001   4.7cm , noted on [place] abd  . Low back pain   . Myasthenia gravis ([place])   . Nocturia   . 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 Spondylosis Lumbar  . Umbilical hernia   . Urinary retention 02/08/2018    Family History:    Family History  Problem Relation Age of Onset  . Hypertension Mother   . Hypertension Father     Social History:  Social History   Tobacco Use  . Smoking status: Never Smoker  . Smokeless tobacco: Never Used  Vaping Use  . Vaping Use: Never used  Substance Use Topics  . Alcohol use: No  . Drug use: Never   Works at [employer] as a Training and development officer for 16 years. Lives in [place] alone, his daughter lives close by.   Review of Systems: Review of Systems  Constitutional: Negative for chills, diaphoresis and fever.  HENT: Negative for congestion and sore throat.   Eyes: Negative for blurred vision and double vision.       +teariness  Respiratory: Negative for cough and shortness of breath.   Cardiovascular: Negative for chest pain and palpitations.  Gastrointestinal: Negative for nausea and vomiting.  Genitourinary: Negative for dysuria and frequency.  Musculoskeletal: Positive for falls. Negative for back pain and neck pain.  Skin: Negative for itching and rash.  Neurological: Positive for dizziness and focal weakness. Negative for headaches.     Physical Exam: Physical Exam Vitals and nursing note reviewed.  Constitutional:      General: He is not in acute distress.    Appearance: Normal appearance. He is not ill-appearing.  HENT:     Head: Normocephalic.     Right Ear: External ear normal.     Left Ear: External ear normal.     Nose: Nose normal.     Mouth/Throat:     Mouth: Mucous membranes are moist.     Pharynx: Oropharynx is clear. No oropharyngeal exudate or posterior oropharyngeal erythema.  Eyes:     Extraocular Movements: Extraocular movements intact.     Conjunctiva/sclera: Conjunctivae normal.     Pupils: Pupils are equal, round, and reactive to light.  Cardiovascular:     Rate and Rhythm: Normal rate and regular rhythm.     Heart sounds: Normal heart sounds. No murmur heard.  No friction rub. No gallop.   Pulmonary:     Effort: Pulmonary effort is normal. No  respiratory distress.     Breath sounds: Normal breath sounds. No stridor. No wheezing, rhonchi or rales.  Abdominal:     General: Abdomen is flat. Bowel sounds are normal.     Palpations: Abdomen is soft.     Tenderness: There is no abdominal tenderness.  Musculoskeletal:        General: Signs of injury present. No deformity. Normal range of motion.     Cervical back: Normal range of motion and neck supple.  Skin:    General: Skin is warm and dry.  Comments: Mild bruising to his R periorbital area and left lower abdomen  Neurological:     General: No focal deficit present.     Mental Status: He is alert and oriented to person, place, and time.     Cranial Nerves: No cranial nerve deficit.     Sensory: No sensory deficit.     Motor: No weakness.     Coordination: Coordination normal.     Comments: L patellar reflex 2+ and R 1+ Muscle strength 5/5 throughout No dysmetria  Psychiatric:        Mood and Affect: Mood normal.        Behavior: Behavior normal.      EKG: personally reviewed my interpretation is normal sinus rhythm, inverted T waves unchanged from prior, tall T waves unchanged from prior, no ST changes     -Similar to EKG on 10/8, which showed changes in T waves compared to prior  CXR: personally reviewed my interpretation is severely rotated, no focal opacities or pleural effusion  Assessment & Plan by Problem: Principal Problem:   Myasthenia gravis ([place]) Active Problems:   Gastroesophageal reflux disease   Essential hypertension  Danny Walker is a 74 y/o M with hx of 74 year old male with a history of mysthenia gravis, HTN, GERD, and BPH presenting with chief complaint of bilateral leg weakness over the past 5 days resulting in a fall, with dizziness since then. No objective neurologic abnormalities on exam.  Myasthenia Gravis:      Dx in 2019, had a flare this year with difficulty swallowing and was hospitalized from 9/3 - 9/8, improved with IVIG. Since  then has been on Mestinon 60mg  at 7am, 60mg  at 12pm, and 30mg  at 3pm. He was also discharged on prednisone 60mg , which his neurologist tried to wean but he had worsening Sx so put back on 60mg . However, he endorses only taking 20mg  tablets with breakfast and lunch.      Presenting now with worsening bilateral LE weakness. Has been otherwise compliant with home meds. No difficulty swallowing or vision changes. Neurology documentation at last presentation on 10/8 reported no objective weakness on physical exam. At that time suspected deconditioning vs mild anxiety and depression. At this time, negative UA and CXR so no clear infection or other trigger for flare.  -restart prednisone 20mg  TID with meals -clarify with daughter whether he takes prednisone 20mg  TID or only twice -restart home mestinon 60mg  in am, 60mg  at noon, 30mg  in pm -holding off on IVIG at this time due to lack of objective weakness -neurology consult in AM -follow up with his neurologist outpatient  Fall with subsequent dizziness Reported a fall in his bathtub 5 days 2/2 leg weakness, striking his head. Since then has had some dizziness. Has some bruising around his R periorbital area which he states is improving. Had some pain there which is also improved. As above, no objective neurologic abnormalities on exam. -CT head negative  Elevated troponin At last ED presentation on 10/3 had troponin of 30 > 27. At that time, EKG had changes in T waves compared to prior (tall T waves, new inversions). Asymptomatic. Cardiology reviewed EKG and ruled out STEMI, recommended overnight stay and serial troponins but patient left. Today continues to be asymptomatic, EKG similar to that on 10/3. Troponin 29. -trend troponin -consider cardiology consult  HTN: Well controlled at this time. -continue home lisinopril 40mg   GERD: In the setting of long term steroid use. -continue protonix 40mg   BPH: Reports well controlled with transurethral  resection of prostate in 2019 and medications. -continue home finasteride 5mg  and fesoterodine 4mg   Dispo: Admit patient to Observation with expected length of stay less than 2 midnights.  Signed: Andrew Au, MD 03/02/2020, 1:46 AM  Pager: [phone]  After 5pm on weekdays and 1pm on weekends: On Call pager: [phone]

## 2020-03-02 NOTE — ED Notes (Signed)
Neurology at bedside.

## 2020-03-02 NOTE — Progress Notes (Signed)
PT Cancellation Note  Patient Details Name: Danny Walker MRN: [id number] DOB: Jun 10, 1945   Cancelled Treatment:    Reason Eval/Treat Not Completed: PT screened, no needs identified, will sign off Per OT, no skilled PT needs as pt was able to ambulate around circle with no LOB. Will sign off. If needs change, please re-consult.   Lou Miner, DPT  Acute Rehabilitation Services  Pager: [phone] Office: [phone]    Rudean Hitt 03/02/2020, 4:06 PM

## 2020-03-02 NOTE — Evaluation (Signed)
Occupational Therapy Evaluation and Discharge Patient Details Name: Danny Walker MRN: [id number] DOB: July 15, 1945 Today's Date: 03/02/2020    History of Present Illness In brief, patient is a 74 year old male with a past medical history of myasthenia gravis, hypertension, GERD and BPH who presented to the ED with worsening weakness and lightheadedness over the last 4 to 5 days with fall. CT-negative   Clinical Impression   This 74 yo male admitted with above presents to acute OT at an independent to Mod I during OT session for basic ADLs. No LOB when ambulating around unit without AD (made PT aware no skilled PT needs identified). No further OT needs identified, we will sign off.    Follow Up Recommendations  No OT follow up    Equipment Recommendations  None recommended by OT       Precautions / Restrictions Precautions Precautions: Fall (since now as a history of falls) Restrictions Weight Bearing Restrictions: No      Mobility Bed Mobility Overal bed mobility: Independent                Transfers Overall transfer level: Independent               General transfer comment: Pt ambulated 150 feet without AD at an independent level, no c/o or s/s of dizziness    Balance Overall balance assessment: No apparent balance deficits (not formally assessed)                                         ADL either performed or assessed with clinical judgement   ADL Overall ADL's : Modified independent;Independent                                             Vision Patient Visual Report: No change from baseline              Pertinent Vitals/Pain Pain Assessment: No/denies pain     Hand Dominance Right   Extremity/Trunk Assessment Upper Extremity Assessment Upper Extremity Assessment: Overall WFL for tasks assessed   Lower Extremity Assessment Lower Extremity Assessment: Overall WFL for tasks assessed        Communication Communication Communication: Prefers language other than English   Cognition Arousal/Alertness: Awake/alert Behavior During Therapy: WFL for tasks assessed/performed Overall Cognitive Status: Within Functional Limits for tasks assessed                                                Home Living Family/patient expects to be discharged to:: Private residence Living Arrangements: Alone Available Help at Discharge: Family;Available PRN/intermittently Type of Home: House Home Access: Ramped entrance     Home Layout: One level     Bathroom Shower/Tub: Teacher, early years/pre: Standard     Home Equipment: Shower seat   Additional Comments: Does not use shower seat      Prior Functioning/Environment Level of Independence: Independent        Comments: Independent, driving, 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                 OT Goals(Current goals can be found in  the care plan section) Acute Rehab OT Goals Patient Stated Goal: to go home  OT Frequency:                AM-PAC OT "6 Clicks" Daily Activity     Outcome Measure Help from another person eating meals?: None Help from another person taking care of personal grooming?: None Help from another person toileting, which includes using toliet, bedpan, or urinal?: None Help from another person bathing (including washing, rinsing, drying)?: None Help from another person to put on and taking off regular upper body clothing?: None Help from another person to put on and taking off regular lower body clothing?: None 6 Click Score: 24   End of Session Equipment Utilized During Treatment: Gait belt Nurse Communication: Mobility status  Activity Tolerance: Patient tolerated treatment well Patient left: in bed;with call bell/phone within reach;with bed alarm set;with family/visitor present                   Time: 1539-1600 OT Time Calculation (min): 21 min Charges:  OT General  Charges $OT Visit: 1 Visit OT Evaluation $OT Eval Moderate Complexity: 1 Mod  Golden Circle, OTR/L Acute [employer] Pager [phone] Office [phone]     Almon Register 03/02/2020, 5:54 PM

## 2020-03-02 NOTE — Progress Notes (Signed)
PT Cancellation Note  Patient Details Name: Danny Walker MRN: [id number] DOB: [birth year]   Cancelled Treatment:    Reason Eval/Treat Not Completed: Active bedrest order Pt currently with bed rest orders. Will follow up as activity orders updated and as schedule allows.   Lou Miner, DPT  Acute Rehabilitation Services  Pager: [phone] Office: [phone]    Rudean Hitt 03/02/2020, 9:16 AM

## 2020-03-02 NOTE — ED Notes (Signed)
Pt transported to ECHO. 

## 2020-03-03 ENCOUNTER — Telehealth: Payer: Self-pay | Admitting: Internal Medicine

## 2020-03-03 DIAGNOSIS — D649 Anemia, unspecified: Secondary | ICD-10-CM

## 2020-03-03 LAB — BASIC METABOLIC PANEL
Anion gap: 9 (ref 5–15)
BUN: 20 mg/dL (ref 8–23)
CO2: 29 mmol/L (ref 22–32)
Calcium: 8.5 mg/dL — ABNORMAL LOW (ref 8.9–10.3)
Chloride: 103 mmol/L (ref 98–111)
Creatinine, Ser: 0.89 mg/dL (ref 0.61–1.24)
GFR, Estimated: >60 mL/min (ref >60)
Glucose, Bld: 100 mg/dL — ABNORMAL HIGH (ref 70–99)
Potassium: 4.3 mmol/L (ref 3.5–5.1)
Sodium: 141 mmol/L (ref 135–145)

## 2020-03-03 LAB — CBC
HCT: 32.3 % — ABNORMAL LOW (ref 39.0–52.0)
Hemoglobin: 9.8 g/dL — ABNORMAL LOW (ref 13.0–17.0)
MCH: 27.3 pg (ref 26.0–34.0)
MCHC: 30.3 g/dL (ref 30.0–36.0)
MCV: 90 fL (ref 80.0–100.0)
Platelets: 148 10*3/uL — ABNORMAL LOW (ref 150–400)
RBC: 3.59 MIL/uL — ABNORMAL LOW (ref 4.22–5.81)
RDW: 19.9 % — ABNORMAL HIGH (ref 11.5–15.5)
WBC: 8 10*3/uL (ref 4.0–10.5)
nRBC: 0.2 % (ref 0.0–0.2)

## 2020-03-03 NOTE — Telephone Encounter (Signed)
TOC HFU APPT 03/10/2020 at 10:15 am.

## 2020-03-03 NOTE — Discharge Instructions (Signed)
Sr. Danny Walker,  Fue un placer conocerlo durante su reciente hospitalizacin. Nuestro estudio indic que su aturdimiento y debilidad probablemente fueron secundarios a la deshidratacin. Proporcionamos lquidos a travs de una va intravenosa para la hidratacin con Abran Duke en sus signos vitales y Mat Carne de laboratorio. Mientras estaba ingresado, neurologa recomend continuar con el manejo actual de la medicacin de su mysathenia gravis con prednisona y piridostigmina. Para sus hallazgos anormales en el electrocardiograma y troponina levemente elevada, obtuvimos un ecocardiograma de su corazn que no mostr anomalas significativas. Sin embargo, es Engineer, production un seguimiento con cardiologa en un entorno ambulatorio para determinar si les gustara hacer alguna prueba adicional. Adems, debe realizar un seguimiento estrecho con su mdico de atencin primaria y con su neurlogo despus del alta. Si presenta sntomas nuevos o que empeoran, llame a su PCP o presntese en el departamento de emergencias para una evaluacin.  Atentamente, Dr. Paulla Dolly, MD  Danny Walker,  It was a pleasure meeting you during your recent hospitalization. Our workup indicated that your lightheadedness and weakness was likely secondary to dehydration. We provided fluids through an IV for hydration with improvement in your vitals and labwork. While admitted, neurology recommended continuing current medication management of your mysathenia gravis with prednisone and pyridostigmine. For your abnormal EKG findings and mildly elevated troponin, we obtained a echocardiogram of your heart which showed no significant abnormalities. However, it is reasonable to follow-up with cardiology in the outpatient setting to determine if they would like to do any additional testing. Additionally, you should follow-up closely with your primary care physician as well as your neurologist following discharge. If you develop new or worsening  symptoms, please call your PCP or present to the emergency department for evaluation.  Sincerely, Dr. Paulla Dolly, MD

## 2020-03-03 NOTE — Progress Notes (Signed)
Brief Neuro Update:  He is not in Myasthenia Crisis based on my evaluation yesterday. He was cleared by OT yesterday and did not have any therapy needs, this is in agreement with my exam yesterday. Nothing to do from a Neurology standpoint at this time. Neurology inpatient team will signoff. Please feel free to contact us with any questions or concerns.   [place] Pager Number [phone]

## 2020-03-03 NOTE — Progress Notes (Signed)
Subjective:   Mr. Danny Bollig. Walker is a 74 year old male with past medical history significant for myasthenia gravis, HTN, GERD, and BPH who presented to [facility] for evaluation of bilateral lower extremity weakness and lightheadedness following a fall.  Overnight, no acute events  This morning, daugther is in the room and assisting with translation. Patient is doing well, he had some leg pain when therapist worked with him yesterday but he did well with that and ambulated well. He denies being lightheaded or dizzy when he ambulated yesterday. Per patient and his daughter, in overall, he is doing better. All of their questions and concerns were addressed.   Objective:  Vital signs in last 24 hours: Vitals:   03/02/20 2309 03/02/20 2344 03/03/20 0313 03/03/20 0751  BP: 126/82 118/71 120/69 129/73  Pulse: 67 63 65 60  Resp: 18 18 18 18   Temp: 98.3 F (36.8 C) 97.8 F (36.6 C) 98 F (36.7 C) 97.8 F (36.6 C)  TempSrc: Oral Oral Oral Oral  SpO2: 97% 94% 96% 97%  Weight:   73 kg   SpO2: 97 % Filed Weights   03/03/20 0313  Weight: 73 kg    Intake/Output Summary (Last 24 hours) at 03/03/2020 1356 Last data filed at 03/03/2020 1030 Gross per 24 hour  Intake 2520 ml  Output 2250 ml  Net 270 ml    Physical Exam Constitutional:      General: He is not in acute distress.    Appearance: Normal appearance.  HENT:     Head: Normocephalic and atraumatic.     Mouth/Throat:     Mouth: Mucous membranes are moist.     Pharynx: Oropharynx is clear.  Eyes:     Extraocular Movements: Extraocular movements intact.     Conjunctiva/sclera: Conjunctivae normal.  Cardiovascular:     Rate and Rhythm: Normal rate and regular rhythm.     Pulses: Normal pulses.     Heart sounds: Normal heart sounds.  Pulmonary:     Effort: Pulmonary effort is normal.     Breath sounds: Normal breath sounds.  Abdominal:     General: Abdomen is flat. Bowel sounds are normal.     Palpations: Abdomen is soft.       Tenderness: There is no abdominal tenderness.  Musculoskeletal:        General: Normal range of motion.     Cervical back: Normal range of motion and neck supple.     Right lower leg: No edema.     Left lower leg: No edema.  Skin:    General: Skin is warm and dry.     Capillary Refill: Capillary refill takes less than 2 seconds.  Neurological:     Mental Status: He is alert and oriented to person, place, and time. Mental status is at baseline.     Cranial Nerves: Cranial nerves are intact. No cranial nerve deficit.     Sensory: Sensation is intact. No sensory deficit.     Motor: No weakness.     Deep Tendon Reflexes: Reflexes normal.  Psychiatric:        Mood and Affect: Mood normal.        Behavior: Behavior normal.        Thought Content: Thought content normal.        Judgment: Judgment normal.    CBC Latest Ref Rng & Units 03/03/2020 03/02/2020 03/01/2020  WBC 4.0 - 10.5 K/uL 8.0 10.2 12.4(H)  Hemoglobin 13.0 - 17.0 g/dL 9.8(L) 12.1(L) 11.2(L)  Hematocrit 39 - 52 % 32.3(L) 40.2 37.8(L)  Platelets 150 - 400 K/uL 148(L) 170 182   CMP Latest Ref Rng & Units 03/03/2020 03/02/2020 03/01/2020  Glucose 70 - 99 mg/dL 100(H) 104(H) 106(H)  BUN 8 - 23 mg/dL 20 26(H) 25(H)  Creatinine 0.61 - 1.24 mg/dL 0.89 0.86 1.06  Sodium 135 - 145 mmol/L 141 140 140  Potassium 3.5 - 5.1 mmol/L 4.3 4.4 4.5  Chloride 98 - 111 mmol/L 103 103 105  CO2 22 - 32 mmol/L 29 26 25   Calcium 8.9 - 10.3 mg/dL 8.5(L) 8.8(L) 9.1  Total Protein 6.0 - 8.5 g/dL - - -  Total Bilirubin 0.0 - 1.2 mg/dL - - -  Alkaline Phos 44 - 121 IU/L - - -  AST 0 - 40 IU/L - - -  ALT 0 - 44 IU/L - - -   Assessment/Plan:  Principal Problem:   Myasthenia gravis (HCC) Active Problems:   Gastroesophageal reflux disease   Essential hypertension  [name] is a 74 year old male with past medical history significant for myasthenia gravis, HTN, GERD, and BPH who presented to [facility] for evaluation of bilateral  lower extremity weakness and lightheadedness following a fall.  #Fall Patient reported a fall in his bathtub several days ago secondary to leg weakness, striking his head. Since then has had some dizziness which he describes as a lightheaded sensation particularly with exertion. Has some bruising around his R periorbital area which he states is improving. Had some pain there which is also improved. No objective neurologic abnormalities on exam. CT Head negative. Following admission, patient was noted to have positive orthostatics (heart rate increased from 70 to 100 upon standing). He received 1L IVF overnight with plan to recheck orthostatics this morning. Occupational therapy evaluated patient and reported that patient does not have physical or occupational therapy needs at this time. -Repeat Orthostatics this morning -Continue to monitor  #Myasthenia gravis, chronic Patient with history of myasthenia gravis originally diagnosed in 2019 admitted to [facility]MTS for concern for myasthenia gravis flare. He has had a prior admission for a flare this year with difficulty swallowing and improved with IVIG. Since discharge, he has been following closely with Dr. Posey Pronto with neurology for management of his condition. Most recently, he reports taking pyridostigmine 60mg  in the morning and afternoon, and 30mg  pyridostigmine in the evening. He reports receiving prednisone 60mg  daily following discharge, however he reports that he has been taking 10mg  tablet of prednisone in the morning and 10mg  tablet of prednisone in the afternoon. He presented for this admission for worsening bilateral lower extremity weakness resulting in a fall several days ago. On physical examination, patient does not appear to have objective weakness and his neuro exam was benign. No clear source of a myasthenia gravis flare at this time, such as infection. We restarted pyridostigmine and prednisone. Neurology was consulted and does not feel that  patient's presentation is consistent with a myasthenia flare. -Prednisone 20mg  TID with meals -Continue home mestinon 60mg  in am, 60mg  at noon, 30mg  in pm -Neurology signed off. Follow-up appointment scheduled with neurology in outpatient setting  #Elevated troponin At last ED presentation on 10/3 had troponin of 30 > 27. At that time, EKG had changes in T waves compared to prior (tall T waves, new inversions). Asymptomatic. Cardiology reviewed EKG and ruled out STEMI, recommended overnight stay and serial troponins but patient left. Today continues to be asymptomatic, EKG similar to that on 10/3. Troponin 29 then 27. Echocardiogram  obtained which shows no significant abnormality. -Cardiology outpatient follow-up (needs referral  #Normocytic anemia Patient initially presented with mild normocytic anemia. Hemoglobin down to 9.8 on day of discharge. He will require workup for his normocytic anemia in the outpatient setting which may contribute to his sensation of weakness and lightheadedness. -Follow-up in outpatient setting  #HTN, chronic Well controlled at this time on home regimen. -continue home lisinopril 40mg   #GERD, chronic In the setting of long term steroid use. -continue protonix 40mg   #BPH, chronic Reports well controlled with transurethral resection of prostate in 2019 and medications. -continue home finasteride 5mg  and fesoterodine 4mg   Code status: Partial Diet: Heart Healthy VTE ppx: Lovenox IVF: None Bowel regimen: None  Cato Mulligan, MD 03/03/2020, 1:56 PM Pager: [phone] After 5pm on weekdays and 1pm on weekends: On Call pager [phone]

## 2020-03-03 NOTE — Discharge Summary (Signed)
Name: Danny Walker MRN: [id number] DOB: [birth year] 74 y.o. PCP: [name], MD  Date of Admission: 03/01/2020  2:01 PM Date of Discharge: 03/03/2020 Attending Physician: Aldine Contes, MD  Discharge Diagnosis: 1. Principal Problem:   Myasthenia gravis ([place]) Active Problems:   Gastroesophageal reflux disease   Essential hypertension  Discharge Medications: Allergies as of 03/03/2020   No Known Allergies     Medication List    TAKE these medications   diclofenac Sodium 1 % Gel Commonly known as: VOLTAREN Apply 2 g topically 4 (four) times daily. What changed:   when to take this  reasons to take this   finasteride 5 MG tablet Commonly known as: PROSCAR Take 1 tablet (5 mg total) by mouth every morning.   lisinopril 40 MG tablet Commonly known as: ZESTRIL TOME UNA TABLETA TODOS LOS DIAS What changed:   how much to take  how to take this  when to take this   naproxen 250 MG tablet Commonly known as: NAPROSYN TOME UNA TABLETA DOS VECES AL DIA CUANDO SEA [place] What changed: See the new instructions.   omeprazole 40 MG capsule Commonly known as: PRILOSEC TOME UNA CAPSULA TODOS LOS DIAS EN LA [place] What changed: See the new instructions.   Oscal 500/200 D-3 500-200 MG-UNIT tablet Generic drug: calcium-vitamin D Take 1 tablet by mouth 2 (two) times daily.   polyvinyl alcohol 1.4 % ophthalmic solution Commonly known as: LIQUIFILM TEARS Place 1 drop into both eyes as needed for dry eyes.   predniSONE 20 MG tablet Commonly known as: Deltasone Take 1 tablet (20 mg total) by mouth 3 (three) times daily with meals. What changed: when to take this   pyridostigmine 60 MG tablet Commonly known as: Mestinon Take 1 tablet (60 mg total) by mouth in the morning AND 1 tablet (60 mg total) daily with lunch AND 0.5 tablets (30 mg total) at bedtime.   Toviaz 4 MG Tb24 tablet Generic drug: fesoterodine Take 4 mg by mouth daily.       Disposition and follow-up:   [name] was discharged from [facility] in Stable condition.  At the hospital follow up visit please address:  1.  Evaluation of patient's lightheadedness and weakness. Place ambulatory referral to cardiology for EKG abnormalities and mildly elevated troponin. Workup normocytic anemia detected during hospitalization.  2.  Labs / imaging needed at time of follow-up: CBC, iron studies  3.  Pending labs/ test needing follow-up: none  Follow-up Appointments:  Follow-up Information    [name], MD. Schedule an appointment as soon as possible for a visit in 2 week(s).   Specialty: Internal Medicine Contact information: [address] [place] [address] [phone]        Alda Berthold, DO. Go in 3 week(s).   Specialty: Neurology Contact information: [place] [address] [phone]              Hospital Course by problem list: #Fall Patient presented to [facility] following a fall in his bathtub several days ago secondary to leg weakness, striking his head. Surrounding the event he reported some dizziness which he describes as a lightheaded sensation particularly with exertion. Initially had some bruising around his R periorbital area which he states is improving. Had some pain there which is also improved. No objective neurologic abnormalities on exam. CT Head negative. Following admission, patient was noted to have positive orthostatics (heart  rate increased from 70 to 100 upon standing). He received 1L IVF overnight with plan to recheck orthostatics this morning. Occupational therapy evaluated patient and reported that patient does not have physical or occupational therapy needs at this time. Neurology signed off as determined not to be a myasthenic crisis and unremarkable neurological examination and workup.   #Myasthenia gravis Patient with history of myasthenia  gravis originally diagnosed in 2019 admitted to [facility]MT[facility] for concern for myasthenia gravis flare. He has had a prior admission for a flare this year with difficulty swallowing and improved with IVIG. Since discharge, he has been following closely with Dr. Posey Pronto with neurology for management of his condition. Most recently, he reports taking pyridostigmine 60mg  in the morning and afternoon, and 30mg  pyridostigmine in the evening. His daughter reported that he had been adherent to prednisone 20mg  three times daily. He presented for this admission for worsening bilateral lower extremity weakness resulting in a fall several days ago. On physical examination, patient does not appear to have objective weakness and his neuro exam was benign. No clear source of a myasthenia gravis flare at this time, such as infection. We restarted pyridostigmine and prednisone. Neurology was consulted and does not feel that patient's presentation is consistent with a myasthenia flare, and signed off with plan to follow-up in outpatient setting. He was continued on his home regimen throughout hospitalization.   #Elevated troponin At prior ED presentation on 10/3 had troponin of 30 > 27. At that time, EKG had changes in T waves compared to prior (tall T waves, new inversions). Asymptomatic. Cardiology reviewed EKG and ruled out STEMI, recommended overnight stay and serial troponins but patient left. On this admission, continues to be asymptomatic, EKG similar to that on 10/3. Troponin 29 then 27. Echocardiogram obtained which shows no significant abnormality. Recommended cardiology follow-up in outpatient setting for which he needs referral.   #Normocytic anemia Patient initially presented with mild normocytic anemia. Hemoglobin down to 9.8 on day of discharge. He will require workup for his normocytic anemia in the outpatient setting which may contribute to his sensation of weakness and lightheadedness.  Discharge Vitals:   BP 129/73  (BP Location: Left Arm)   Pulse 60   Temp 97.8 F (36.6 C) (Oral)   Resp 18   Wt 73 kg   SpO2 97%   BMI 24.47 kg/m   Pertinent Labs, Studies, and Procedures:  CBC Latest Ref Rng & Units 03/03/2020 03/02/2020 03/01/2020  WBC 4.0 - 10.5 K/uL 8.0 10.2 12.4(H)  Hemoglobin 13.0 - 17.0 g/dL 9.8(L) 12.1(L) 11.2(L)  Hematocrit 39 - 52 % 32.3(L) 40.2 37.8(L)  Platelets 150 - 400 K/uL 148(L) 170 182   CMP Latest Ref Rng & Units 03/03/2020 03/02/2020 03/01/2020  Glucose 70 - 99 mg/dL 100(H) 104(H) 106(H)  BUN 8 - 23 mg/dL 20 26(H) 25(H)  Creatinine 0.61 - 1.24 mg/dL 0.89 0.86 1.06  Sodium 135 - 145 mmol/L 141 140 140  Potassium 3.5 - 5.1 mmol/L 4.3 4.4 4.5  Chloride 98 - 111 mmol/L 103 103 105  CO2 22 - 32 mmol/L 29 26 25   Calcium 8.9 - 10.3 mg/dL 8.5(L) 8.8(L) 9.1  Total Protein 6.0 - 8.5 g/dL - - -  Total Bilirubin 0.0 - 1.2 mg/dL - - -  Alkaline Phos 44 - 121 IU/L - - -  AST 0 - 40 IU/L - - -  ALT 0 - 44 IU/L - - -   Discharge Instructions: Discharge Instructions    Diet - low  sodium heart healthy   Complete by: As directed    Increase activity slowly   Complete by: As directed      Sr. Anastasio Auerbach,  Fue un placer conocerlo durante su reciente hospitalizacin. Nuestro estudio indic que su aturdimiento y debilidad probablemente fueron secundarios a la deshidratacin. Proporcionamos lquidos a travs de una va intravenosa para la hidratacin con Abran Duke en sus signos vitales y Mat Carne de laboratorio. Mientras estaba ingresado, neurologa recomend continuar con el manejo actual de la medicacin de su mysathenia gravis con prednisona y piridostigmina. Para sus hallazgos anormales en el electrocardiograma y troponina levemente elevada, obtuvimos un ecocardiograma de su corazn que no mostr anomalas significativas. Sin embargo, es Engineer, production un seguimiento con cardiologa en un entorno ambulatorio para determinar si les gustara hacer alguna prueba adicional. Adems, debe  realizar un seguimiento estrecho con su mdico de atencin primaria y con su neurlogo despus del alta. Si presenta sntomas nuevos o que empeoran, llame a su PCP o presntese en el departamento de emergencias para una evaluacin.  Atentamente, Dr. Paulla Dolly, MD  Mr. Fogleman,  It was a pleasure meeting you during your recent hospitalization. Our workup indicated that your lightheadedness and weakness was likely secondary to dehydration. We provided fluids through an IV for hydration with improvement in your vitals and labwork. While admitted, neurology recommended continuing current medication management of your mysathenia gravis with prednisone and pyridostigmine. For your abnormal EKG findings and mildly elevated troponin, we obtained a echocardiogram of your heart which showed no significant abnormalities. However, it is reasonable to follow-up with cardiology in the outpatient setting to determine if they would like to do any additional testing. Additionally, you should follow-up closely with your primary care physician as well as your neurologist following discharge. If you develop new or worsening symptoms, please call your PCP or present to the emergency department for evaluation.  Sincerely, Dr. Paulla Dolly, MD  Signed: Cato Mulligan, MD 03/03/2020, 2:35 PM   Pager: [phone]

## 2020-03-10 ENCOUNTER — Ambulatory Visit (INDEPENDENT_AMBULATORY_CARE_PROVIDER_SITE_OTHER): Payer: Medicare Other | Admitting: Student

## 2020-03-10 ENCOUNTER — Encounter: Payer: Self-pay | Admitting: Student

## 2020-03-10 ENCOUNTER — Other Ambulatory Visit: Payer: Self-pay

## 2020-03-10 VITALS — BP 160/103 | HR 88 | Temp 98.2°F | Ht 65.0 in | Wt 159.4 lb

## 2020-03-10 DIAGNOSIS — Z0000 Encounter for general adult medical examination without abnormal findings: Secondary | ICD-10-CM

## 2020-03-10 DIAGNOSIS — K219 Gastro-esophageal reflux disease without esophagitis: Secondary | ICD-10-CM

## 2020-03-10 DIAGNOSIS — G7000 Myasthenia gravis without (acute) exacerbation: Secondary | ICD-10-CM | POA: Diagnosis not present

## 2020-03-10 DIAGNOSIS — D509 Iron deficiency anemia, unspecified: Secondary | ICD-10-CM | POA: Insufficient documentation

## 2020-03-10 DIAGNOSIS — D649 Anemia, unspecified: Secondary | ICD-10-CM | POA: Diagnosis not present

## 2020-03-10 DIAGNOSIS — I10 Essential (primary) hypertension: Secondary | ICD-10-CM | POA: Diagnosis not present

## 2020-03-10 NOTE — Patient Instructions (Addendum)
  Clayburn Pert, seor [name] por permitirnos brindarle atencin hoy. Hoy hablamos sobre la debilidad de sus piernas y los [employer]. La debilidad de su pierna es causada por su diagnstico mdico llamado miastenia gravis. Se descubri que tena anemia mientras estaba en el hospital, por lo que estamos revisando algunos laboratorios para Neurosurgeon por qu sus niveles en sangre son bajos.  Tambin hemos escrito una nota de trabajo para usted y se la enviaremos por fax a su trabajo hoy.  He ordenado los siguientes laboratorios para usted:   rdenes de laboratorio     CBC con Diff     Hierro e IBC [phone])     Ferritina     Anticuerpo de la hepatitis C  Llamar si alguno es anormal. Se puede acceder a todos sus laboratorios a travs de "Mi grfico".  Contine tomando sus medicamentos segn lo prescrito. Haga un seguimiento con su neurlogo la prxima Nutritional therapist un seguimiento en 3 meses  Si tiene alguna pregunta o inquietud, llame a la clnica de medicina interna al [phone].  Linwood Dibbles, MD, MPH [place] a mi carta: [email or web address]   Si an no lo ha hecho, Set designer COVID 19 Para programar una cita para una opcin de vacuna COVID cualquiera de los siguientes: Vaya a [email or web address] Vaya a [email or web address] Llame al [phone] Susann Givens [phone] y seleccione la opcin 2   Thank you, [name] for allowing [place] to provide your care today. Today we discussed your leg weakness and occasional dizziness.  Your leg weakness is caused by your medical diagnosis called Myasthenia gravis. You were found to have anemia while in the hospital so we are checking some labs to figure out why your blood levels are low.  We have also wrote a work note for you and will fax it to your work today.  I have ordered the following labs for  you:   Lab Orders     CBC with Diff     Iron and IBC ([id number])     Ferritin     Hepatitis C antibody   I will call if any are abnormal. All of your labs can be accessed through "My Chart".  Please continue to take your medications as prescribed.  Follow-up with your neurologist next week  Please follow-up in 3 months  Should you have any questions or concerns please call the internal medicine clinic at [phone].    Linwood Dibbles, MD, MPH [facility]   My Chart Access: [email or web address]   If you have not already done so, please get your COVID 19 vaccine  To schedule an appointment for a COVID vaccine choice any of the following: Go to [email or web address]   Go to [email or web address]                  Call [phone]                                     Call [phone] and select Option 2

## 2020-03-10 NOTE — Assessment & Plan Note (Addendum)
Patient's blood pressures elevated to 162/104 today. Repeat BP still elevated at 160/103.  Patient is on lisinopril 40 mg daily.   Plan: --Continue lisinopril 40 mg daily --Consider an additional hypertensive agent or combination therapy if BP still elevated at next office visit.

## 2020-03-10 NOTE — Assessment & Plan Note (Signed)
Patient was found to have normocytic anemia during recent hospitalization. Hemoglobin was down to 9.8 at discharge from a high of 12.1. Patient denies any active bleeds. Will do iron studies to assess for iron deficiency anemia.  Plan: --Follow-up CBC --Follow-up iron, TIBC and ferritin

## 2020-03-10 NOTE — Progress Notes (Signed)
   CC: Weakness  HPI:  [name] is a 74 y.o. male with PMH of myasthenia gravis, hypertension GERD and BPH who presented to clinic for leg weakness and fatigue.   Please see problem based charting for evaluation, assessment and plan.  Past Medical History:  Diagnosis Date  . BPH (benign prostatic hyperplasia)   . DDD (degenerative disc disease), lumbosacral   . Fatty liver   . Foley catheter in place   . History of gallstones   . History of prostatitis   . Hypertension   . IDA (iron deficiency anemia)   . Liver mass 09/17/2001   4.7cm , noted on [place] abd  . Low back pain   . Myasthenia gravis ([place])   . Nocturia   . 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 Spondylosis Lumbar  . Umbilical hernia   . Urinary retention 02/08/2018  . Weakness of both legs 02/2020   Review of Systems: Patient endorse some occasional dizziness and headaches but denies any blurry vision, chest pain, dyspnea on exertion, palpitations, abdominal pain or back pain.  Physical Exam:  General: Pleasant Spanish-speaking elderly man sitting in a chair. No acute distress. HEENT: [place]/AT. Mild ptosis. PERRLA.  EOMI. Cardiac: RRR. No murmurs, rubs or gallops. S1, S2. No lower extremities edema Respiratory: Lungs CTAB. No wheezing or crackles. No increased WOB Abdominal: Soft, symmetric and non tender. No organomegaly. Normal bowel sounds Skin: Warm, dry and intact without rashes or lesions Extremities: Atraumatic. Full ROM. Pulse palpable. Neuro: A&O x 3. Moves all extremities. 5/5 strength in all extremities. Normal sensation. Psych: Appropriate mood and affect. Normal judgement  Vitals:   03/10/20 0952 03/10/20 1001  BP: (!) 162/104 (!) 160/103  Pulse: 84 88  Temp: 98.2 F (36.8 C)   TempSrc: Oral   SpO2: 100%   Weight: 159 lb 6.4 oz (72.3 kg)   Height: 5\' 5"  (1.651 m)     Assessment & Plan:   See Encounters Tab for problem based charting.  Patient seen  with Dr. Lockie Pares, MD, MPH

## 2020-03-10 NOTE — Assessment & Plan Note (Signed)
No complaints today. Currently well controlled on omeprazole.  Plan: --Continue omeprazole 40 mg daily

## 2020-03-10 NOTE — Assessment & Plan Note (Signed)
Patient states he has had above Covid vaccines plus the booster.  He has also received the flu and pneumonia vaccines this year.  Plan to screen for hep C today.

## 2020-03-10 NOTE — Assessment & Plan Note (Addendum)
Patient presented to clinic for hospital follow-up.  States his leg weakness has improved but he continues to have weakness limiting his daily activities. He endorses occasional dizziness and headaches. States he is compliant with his medications. Requesting a work note to allow him to recover before going back to work at [employer].  Plan: --Continue Mestinon 60 mg every morning, 60 mg daily with lunch and 30 mg at night --Continue prednisone 20 mg 3 times daily with meals --Follow-up with neurology next week --Work note faxed to employer and a copy given to patient.

## 2020-03-11 LAB — CBC WITH DIFFERENTIAL/PLATELET
Basophils Absolute: 0 10*3/uL (ref 0.0–0.2)
Basos: 0 %
EOS (ABSOLUTE): 0 10*3/uL (ref 0.0–0.4)
Eos: 0 %
Hematocrit: 38.3 % (ref 37.5–51.0)
Hemoglobin: 12.3 g/dL — ABNORMAL LOW (ref 13.0–17.7)
Lymphocytes Absolute: 0.6 10*3/uL — ABNORMAL LOW (ref 0.7–3.1)
Lymphs: 4 %
MCH: 27.8 pg (ref 26.6–33.0)
MCHC: 32.1 g/dL (ref 31.5–35.7)
MCV: 87 fL (ref 79–97)
Monocytes Absolute: 0.3 10*3/uL (ref 0.1–0.9)
Monocytes: 2 %
Neutrophils Absolute: 12.3 10*3/uL — ABNORMAL HIGH (ref 1.4–7.0)
Neutrophils: 86 %
Platelets: 183 10*3/uL (ref 150–450)
RBC: 4.42 x10E6/uL (ref 4.14–5.80)
RDW: 19.1 % — ABNORMAL HIGH (ref 11.6–15.4)
WBC: 13.8 10*3/uL — ABNORMAL HIGH (ref 3.4–10.8)

## 2020-03-11 LAB — HEPATITIS C ANTIBODY: Hep C Virus Ab: <0.1 s/co ratio (ref 0.0–0.9)

## 2020-03-11 LAB — FERRITIN: Ferritin: 53 ng/mL (ref 30–400)

## 2020-03-11 LAB — IMMATURE CELLS
Bands(Auto) Relative: 3 %
MYELOCYTES: 1 % — ABNORMAL HIGH (ref 0–0)
Metamyelocytes: 4 % — ABNORMAL HIGH (ref 0–0)

## 2020-03-11 LAB — IRON AND TIBC
Iron Saturation: 14 % — ABNORMAL LOW (ref 15–55)
Iron: 54 ug/dL (ref 38–169)
Total Iron Binding Capacity: 400 ug/dL (ref 250–450)
UIBC: 346 ug/dL — ABNORMAL HIGH (ref 111–343)

## 2020-03-11 NOTE — Progress Notes (Signed)
Internal Medicine Clinic Attending  I saw and evaluated the patient.  I personally confirmed the key portions of the history and exam documented by Dr. Amponsah and I reviewed pertinent patient test results.  The assessment, diagnosis, and plan were formulated together and I agree with the documentation in the resident's note.  

## 2020-03-11 NOTE — Progress Notes (Signed)
Will call him via interpreter service tomorrow.

## 2020-03-13 NOTE — Progress Notes (Signed)
Unable to reach pt so let VM with callback number via Spanish interpreter.

## 2020-03-16 ENCOUNTER — Other Ambulatory Visit: Payer: Medicare Other

## 2020-03-16 ENCOUNTER — Ambulatory Visit (INDEPENDENT_AMBULATORY_CARE_PROVIDER_SITE_OTHER): Payer: Medicare Other | Admitting: Neurology

## 2020-03-16 ENCOUNTER — Encounter: Payer: Self-pay | Admitting: Neurology

## 2020-03-16 ENCOUNTER — Other Ambulatory Visit: Payer: Self-pay

## 2020-03-16 VITALS — BP 162/91 | HR 87 | Ht 65.0 in | Wt 162.0 lb

## 2020-03-16 DIAGNOSIS — R5381 Other malaise: Secondary | ICD-10-CM

## 2020-03-16 DIAGNOSIS — Z1321 Encounter for screening for nutritional disorder: Secondary | ICD-10-CM

## 2020-03-16 DIAGNOSIS — R292 Abnormal reflex: Secondary | ICD-10-CM | POA: Diagnosis not present

## 2020-03-16 DIAGNOSIS — G7000 Myasthenia gravis without (acute) exacerbation: Secondary | ICD-10-CM | POA: Diagnosis not present

## 2020-03-16 DIAGNOSIS — D649 Anemia, unspecified: Secondary | ICD-10-CM

## 2020-03-16 DIAGNOSIS — M4807 Spinal stenosis, lumbosacral region: Secondary | ICD-10-CM

## 2020-03-16 LAB — VITAMIN B12: Vitamin B-12: 182 pg/mL — ABNORMAL LOW (ref 211–911)

## 2020-03-16 NOTE — Patient Instructions (Addendum)
Check vitamin B12  Start home physical therapy  MRI lumbar spine without contrast  Continue medications as you are taking  Letter provided for work   Return to clinic in 2 months

## 2020-03-16 NOTE — Progress Notes (Signed)
Follow-up Visit   Date: 03/16/20   Danny Walker MRN: [id number] DOB: 1946-05-03   Interim History: Danny Walker is a 74 y.o. Spanish-speaking male from [place] with history of hypertension, prostatitis, liver mass, and urinary retention returning to the clinic for follow-up of seropositive myasthenia gravis.  The patient was accompanied to the clinic by self.  Spanish interpretor services were used via telephone.  History of present illness: Patient was hospitalized at [facility] from 11/11 - 04/12/2018 with 80-month history of progressive difficulty swallowing, facial weakness, vertical double vision, and 30lb weight loss.  Due to high clinical suspicion for myasthenia gravis, he was started on mestinon 60mg  TID with marked improvement.  He necessitated brief placement of NG tube for feeding and medications.  MRI brain showed chronic microischemic changes.  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  He was started on prednisone 20mg  and titrated to 60mg /d.  He was on prednisone 60mg  daily and mestinon 60mg  four times daily (4am, 10am, 4pm, 10am) since November 2019, which has successfully been tapered.  Fortunately, he has not had any relapse while coming down on prednisone and has been on 10mg  since August 2020.  Due to leg cramps, his mestinon was reduced to 30mg  BID which has helped.  Patient was doing well until July 2021 when he began having bulbar weakness.   UPDATE 03/16/2020:  He had two ER visits in October for weakness, however, based on his exam, it was not felt that weakness was stemming from MG exacerbation.  There was concern for STEMI, but patient left AMA without recommended cardiac work-up.  He returned to ER two days later with dizziness/lightheadedness and suffered a fall in the bathtub.  Orthostatic were positive.  CT head negative and again, no evidence of MG exacerbation.   He complains of  weakness in the legs.  He also complains of watery eyes.  He is compliant with prednisone 40mg /d and mestinon 60-60-30.  He denies double vision, droopiness of the eyelids, difficulty swallowing/talking.     Medications:  Current Outpatient Medications on File Prior to Visit  Medication Sig Dispense Refill  . calcium-vitamin D (OSCAL 500/200 D-3) 500-200 MG-UNIT tablet Take 1 tablet by mouth 2 (two) times daily. 60 tablet 11  . diclofenac Sodium (VOLTAREN) 1 % GEL Apply 2 g topically 4 (four) times daily. (Patient taking differently: Apply 2 g topically 4 (four) times daily as needed (pain). ) 100 g 3  . fesoterodine (TOVIAZ) 4 MG TB24 tablet Take 4 mg by mouth daily.    . finasteride (PROSCAR) 5 MG tablet Take 1 tablet (5 mg total) by mouth every morning. 90 tablet 3  . lisinopril (ZESTRIL) 40 MG tablet TOME UNA TABLETA TODOS LOS DIAS (Patient taking differently: Take 40 mg by mouth daily. TOME UNA TABLETA TODOS LOS DIAS) 90 tablet 3  . naproxen (NAPROSYN) 250 MG tablet TOME UNA TABLETA DOS VECES AL DIA CUANDO SEA NECESARIO PARA EL DOLOR (Patient taking differently: Take 250 mg by mouth daily as needed (pain). ) 28 tablet 0  . omeprazole (PRILOSEC) 40 MG capsule TOME UNA CAPSULA TODOS LOS DIAS EN LA MANANA (Patient taking differently: Take 40 mg by mouth daily. ) 90 capsule 3  . polyvinyl alcohol (LIQUIFILM TEARS) 1.4 % ophthalmic solution Place 1 drop into both eyes as needed for dry eyes. 15 mL 0  . predniSONE (DELTASONE) 20 MG tablet Take 1 tablet (20 mg  total) by mouth 3 (three) times daily with meals. (Patient taking differently: Take 20 mg by mouth 2 (two) times daily with a meal. ) 90 tablet 2  . pyridostigmine (MESTINON) 60 MG tablet Take 1 tablet (60 mg total) by mouth in the morning AND 1 tablet (60 mg total) daily with lunch AND 0.5 tablets (30 mg total) at bedtime. 75 tablet 0   No current facility-administered medications on file prior to visit.    Allergies: No Known  Allergies  Vital Signs:  BP (!) 162/91   Pulse 87   Ht 5\' 5"  (1.651 m)   Wt 162 lb (73.5 kg)   BMI 26.96 kg/m   Neurological Exam: MENTAL STATUS including orientation to time, place, person, recent and remote memory, attention span and concentration, language, and fund of knowledge is normal.  Speech is clear, no dysarthria.   CRANIAL NERVES:  Pupils are round and reactive. Normal conjugate, extra-ocular eye movements in all directions of gaze.  No ptosis at rest or with sustained upgaze. No bulbar weakness.   MOTOR:  Motor strength is 5/5 in all extremities, including bilateral legs..  Neck flexion and extension is 5/5  REFLEXES:  Reflexes are 2+/4 throughout, except 3+/4 bilateral patella  COORDINATION/GAIT:   Gait narrow based and stable.   Data: MRI brain wo contrast 04/03/2018: 1. Discontinued examination due to patient's cessation of choking. 2. No acute intracranial abnormality. Chronic small vessel ischemia.  CT chest w contrast 04/07/2018: No evidence of anterior mediastinal mass to suggest thymoma. No acute cardiopulmonary disease.  Labs 04/04/2018:  AChR binding 7.86*, blocking 65*, modulating 57*, TSH 1.77, CK 75, HIV neg, RPR neg   IMPRESSION/PLAN: 1.  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  Exam does not show any bulbar or limb weakness and does not explain leg weakness  - Stay on prednisone 40mg /d   - Continue mestinon 60mg  at 7am, 60mg  at noon, and 30mg  at 5p  2.  Subjective, Bilateral leg weakness  - To be complete, check MRI lumbar spine  - Start physical therapy  2.  Longterm steroid side effects              - Continue omeprazole 40mg  daily              - Continue calcium 1200 mg/day and vitamin D intake of 800 international units/day  Return to clinic in 2 months  Total time spent reviewing records, interview, history/exam, documentation, and coordination of care on day of encounter:  40 min     Total time spent reviewing  records, interview, history/exam, documentation, and coordination of care on day of encounter:  30 min        Thank you for allowing me to participate in patient's care.  If I can answer any additional questions, I would be pleased to do so.    Sincerely,    Tayia Stonesifer K. Posey Pronto, DO

## 2020-03-17 ENCOUNTER — Telehealth: Payer: Self-pay | Admitting: Neurology

## 2020-03-17 ENCOUNTER — Telehealth: Payer: Self-pay

## 2020-03-17 MED ORDER — CYANOCOBALAMIN 1000 MCG/ML IJ SOLN: 1000.0000 ug | Freq: Once | INTRAMUSCULAR | 1 refills | Status: DC

## 2020-03-17 MED ORDER — "SYRINGE 21G X 1"" 3 ML MISC": 1 refills | Status: DC

## 2020-03-17 MED ORDER — "BD ECLIPSE NEEDLE 25G X 1-1/2"" MISC": 1 refills | Status: DC

## 2020-03-17 NOTE — Telephone Encounter (Signed)
Patient's daughter called and said she was not sure the pharmacy gave her the right B-12. They look alike and she is confused.

## 2020-03-17 NOTE — Telephone Encounter (Signed)
Called and spoke to patients daughter. Informed her of patients b12 results and recommendations from Dr Posey Pronto. [name] informed me that she would like to be trained on how to administer B12 shots. Patients daughter will come by office today 03/17/2020 around 2pm.  Called [name] at 10:15am and left a message for a call back. Would like to ask [name] if possible to bring patient in with her so we may give him his first shot today and train her with patient present.

## 2020-03-17 NOTE — Telephone Encounter (Signed)
-----   Message from Alda Berthold, DO sent at 03/17/2020  8:46 AM EDT ----- Please inform patient that his vitamin B12 is very low and this may be causing his fatigue and weakness. He needs to start Vitamin B12 107mcg IM injection daily x 7 days, weekly x 4 weeks, then monthly thereafter x 1 year. You may need to speak via interpreter line or to his daughter. Thanks

## 2020-03-18 NOTE — Telephone Encounter (Signed)
Called patients daughter back. She stated that the needles look different that the pharmacy gave her. I informed her that different manufacturers will have different looking needles and that there are more than several different size needles that can be used to give b12 injections. I informed patient that if the needles don't work for patient we can order new needles. Patients daughter verbalized understanding.

## 2020-03-23 ENCOUNTER — Ambulatory Visit (INDEPENDENT_AMBULATORY_CARE_PROVIDER_SITE_OTHER): Payer: Medicare Other | Admitting: Internal Medicine

## 2020-03-23 ENCOUNTER — Other Ambulatory Visit: Payer: Self-pay

## 2020-03-23 DIAGNOSIS — R29898 Other symptoms and signs involving the musculoskeletal system: Secondary | ICD-10-CM

## 2020-03-23 DIAGNOSIS — H539 Unspecified visual disturbance: Secondary | ICD-10-CM

## 2020-03-23 DIAGNOSIS — M6281 Muscle weakness (generalized): Secondary | ICD-10-CM | POA: Diagnosis present

## 2020-03-23 DIAGNOSIS — H538 Other visual disturbances: Secondary | ICD-10-CM

## 2020-03-23 HISTORY — DX: Unspecified visual disturbance: H53.9

## 2020-03-23 HISTORY — DX: Other symptoms and signs involving the musculoskeletal system: R29.898

## 2020-03-23 MED ORDER — PYRIDOSTIGMINE BROMIDE 60 MG PO TABS: ORAL_TABLET | ORAL | 0 refills | Status: DC

## 2020-03-23 MED ORDER — FERROUS SULFATE 325 (65 FE) MG PO TABS: 325.0000 mg | ORAL_TABLET | Freq: Every day | ORAL | 3 refills | Status: DC

## 2020-03-23 NOTE — Assessment & Plan Note (Signed)
Patient reports that for the past 10 to 15 days he has been having episodes of "dizziness", bilateral lower extremity weakness, eye pressure, blurry vision and teary eyes. He has been using compresses on his eyes which he states sometimes helps with the blurriness. States that the blurry vision occurs after he takes his medication, about 30 minutes after eating lunch and dinner, will last up to an hour at a time. Denies any double vision. Denies any eye pain, discharge, nausea, vomiting, headache, lightheadedness, fevers, chills. States he saw an ophthalmologist a few months ago, was told that he had cataracts and needs cataract surgery. On exam he has some mild scleral edema, no conjunctival injection or drainage, extraocular motion intact, PERRLA. Reached out to ophthalmology, reported that he was told he had cataracts and may need reading glasses, no other abnormalities were noted. On review of his medications he is currently on Mestinon which could have a side effect of lacrimation and disturbances. We discussed that I will reach out to neurology to see if any changes should/could be made to see if this could be beneficial.   -Reached out to patient's neurologist, Dr. Posey Pronto, regarding potential side effect of Mestinon, she is agreeable to decreasing Mestinon to 30 mg 3 times daily. Contacted patient's daughter's change, sent in new prescription for decreased Mestinon dose at 30 mg TID with meals -Advised to follow-up with ophthalmology

## 2020-03-23 NOTE — Assessment & Plan Note (Addendum)
Patient reports having episodes of bilateral lower extremity weakness and had a fall about 1 month ago, he was admitted 1 month ago where he was evaluated by neurology, did not feel like he was having a myasthenia flare at that time, and was found to be orthostatic. He denies symptoms of lightheadedness or room spinning, reports that the dizziness is more of a weakness. States that this occurs about 30 minutes after eating breakfast and 30 minutes after eating lunch. He reports drinking about four bottles of water per day, does not drink much with breakfast or lunch. Doesn't have any issues with standing up too quickly. Blood pressure today is mildly elevated at 140/93, orthostatic were negative. He was recently started on B12 injections for low B12 levels. On exam his lower extremity strength is 5 out of 5 bilaterally, no acute neurological deficits noted. Given recurrence of symptoms following his meals this could be related to postprandial syncope, does not have a little p.o. intake. He also has anemia, low iron and low B12 levels which also could be contributing to his lower extremity weakness. Started on B12 injections, will start him on iron supplementation and advised to increase his p.o. intake with meals.  -Increase p.o. intake, advised to drink 1 to 2 glasses of water per meal -Continue B12 injections -Start iron supplementation -Continue PT

## 2020-03-23 NOTE — Progress Notes (Signed)
   CC: Weakness, eye pressure, dizziness, and teary eyes  HPI:  [name] is a 74 y.o. with a history listed below including myasthenia gravis presenting for flank pressure, blurry vision, tearing eyes, and bilateral lower extremity weakness.  Past Medical History:  Diagnosis Date  . BPH (benign prostatic hyperplasia)   . DDD (degenerative disc disease), lumbosacral   . Fatty liver   . Foley catheter in place   . History of gallstones   . History of prostatitis   . Hypertension   . IDA (iron deficiency anemia)   . Liver mass 09/17/2001   4.7cm , noted on [place] abd  . Low back pain   . Myasthenia gravis ([place])   . Nocturia   . 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 Spondylosis Lumbar  . Umbilical hernia   . Urinary retention 02/08/2018  . Weakness of both legs 02/2020   Review of Systems:   Constitutional: Negative for chills and fever.  Ophthalmologic: Positive for blurry vision, eye pressure, teary-eyed. Negative for double vision or leg weakness. Respiratory: Negative for shortness of breath.   Cardiovascular: Negative for chest pain and leg swelling.  Gastrointestinal: Negative for abdominal pain, nausea and vomiting.  Neurological: Positive for dizziness, and bilateral leg weakness. Negative for headaches.   Physical Exam:  Vitals:   03/23/20 0926 03/23/20 0930 03/23/20 0933 03/23/20 0935  BP: 140/81 139/85 137/87 (!) 140/93  Pulse: (!) 104 96 (!) 103 (!) 103  Temp: 98.2 F (36.8 C)     SpO2: 98%     Weight: 163 lb 8 oz (74.2 kg)     Height: 5\' 5"  (1.651 m)      Physical Exam Constitutional:      Appearance: Normal appearance.  HENT:     Head: Normocephalic and atraumatic.  Eyes:     General:        Right eye: No discharge.        Left eye: No discharge.     Extraocular Movements: Extraocular movements intact.     Pupils: Pupils are equal, round, and reactive to light.     Comments: Mild scleral edema  Cardiovascular:       Rate and Rhythm: Normal rate and regular rhythm.     Pulses: Normal pulses.     Heart sounds: Normal heart sounds.  Pulmonary:     Effort: Pulmonary effort is normal.     Breath sounds: Normal breath sounds.  Abdominal:     General: Abdomen is flat. Bowel sounds are normal.     Palpations: Abdomen is soft.  Musculoskeletal:        General: No swelling. Normal range of motion.  Skin:    General: Skin is warm and dry.     Capillary Refill: Capillary refill takes less than 2 seconds.  Neurological:     General: No focal deficit present.     Mental Status: He is alert and oriented to person, place, and time.     Cranial Nerves: No cranial nerve deficit.     Sensory: No sensory deficit.     Motor: No weakness.  Psychiatric:        Mood and Affect: Mood normal.        Behavior: Behavior normal.      Assessment & Plan:   See Encounters Tab for problem based charting.  Patient discussed with Dr. Jimmye Norman

## 2020-03-23 NOTE — Patient Instructions (Addendum)
Danny Walker,  It was a pleasure to see you today. Thank you for coming in.   Today we discussed your weakness and dizziness. This could be due to lower blood pressures after you eat, please make sure that you are drinking plenty of water with each meal, aim for 1-2 glasses of water per meal.   You blurry vison and teary eyes may be related to your medications, I will send a message to the neurologist to see if any changes could be made. There is no need for immediate changes at this time. Please make sure to make an appointment with the eye doctor to have your vision checked.    We also discussed your blood counts, your iron studies show that you have low iron. Please start taking iron supplementation.    Please return to clinic in 1-2 months or sooner if needed.   Thank you again for coming in.   [name] M.D.  [name],  Fue un placer verte hoy. Gracias por venir.  Hoy hablamos de tu debilidad y [place]. Esto podra deberse a una presin arterial ms baja despus de comer, asegrese de beber mucha agua con cada comida, apunte a 1-2 vasos de agua por comida.  Su visin borrosa y ojos llorosos pueden estar relacionados con sus medicamentos, enviar un mensaje al neurlogo para ver si se pueden hacer cambios. No es necesario Web designer. Asegrese de programar una cita con el oculista para que le revisen la vista.  Tambin discutimos sus recuentos sanguneos, sus estudios de hierro [employer] tiene niveles bajos de hierro. Empiece a tomar suplementos de hierro.  Regrese a Copy en 1-2 meses o antes si es necesario.  Gracias de nuevo por venir.  [name].

## 2020-03-25 ENCOUNTER — Other Ambulatory Visit: Payer: Self-pay | Admitting: Neurology

## 2020-03-30 NOTE — Progress Notes (Signed)
Internal Medicine Clinic Attending ° °Case discussed with Dr. Krienke  At the time of the visit.  We reviewed the resident’s history and exam and pertinent patient test results.  I agree with the assessment, diagnosis, and plan of care documented in the resident’s note.  °

## 2020-04-02 ENCOUNTER — Other Ambulatory Visit: Payer: Self-pay | Admitting: Internal Medicine

## 2020-04-06 ENCOUNTER — Other Ambulatory Visit: Payer: Medicare Other

## 2020-04-10 ENCOUNTER — Telehealth: Payer: Self-pay | Admitting: Neurology

## 2020-04-10 NOTE — Telephone Encounter (Signed)
Please request the patient to call the office to schedule appointment, we do not have same-day walk-in appointments, there may have been a misunderstanding if he was under this impression. I had ordered MRI lumbar spine in October - can you follow-up on when this can be scheduled?  We can work him in on 11/30 at noon and put him on a cancellation list.

## 2020-04-10 NOTE — Telephone Encounter (Signed)
Called and spoke to patient with Interpreter on phone Id# [phone] name: Danny Walker.  Patient stated that he isn't feeling any better and feels worse. Patient states he feels delicate. Informed patient that per Dr. Posey Pronto there must of been a misunderstanding and language barrier but we do not do walk in appts. Dr. Posey Pronto informed patient to call our office if he isn't feeling better.   Also, patient was asked why he didn't get his MRI done? Patient stated that he has panic attacks and feels if he was in that machine for a long time he would have a panic attack and that would not be good. I informed patient that there is medication we can provide that will help keep patient calm for the duration of the MRI. Patient said "I am not going to do it."   Patient also stated that in the mornings he eats breakfast and takes his medication, and 15 minutes later he will start to feel weak in the legs, shaking, and trembling. Patient stated at lunch he eats and takes medication again and feels the same.   Patient requested an appt soon and is scheduled on 11/30 and placed on a wait list. The patient is aware that being placed on the wait list does not guarantee a sooner appt.   Patient was advised that if he feels worse to seek care from an Urgent care or ER.

## 2020-04-10 NOTE — Telephone Encounter (Signed)
Patient came into the office stating Dr. Posey Pronto told him when his symptoms got worse to come into the office to see her. He said he is unable to really walk or get around and needs to see Dr. Posey Pronto.

## 2020-04-10 NOTE — Telephone Encounter (Signed)
Patient's daughter [name] called in and left a message. I called her back and scheduled the patient for 04/21/20 at 12:00 PM and added him to the cancellation list

## 2020-04-20 ENCOUNTER — Other Ambulatory Visit (INDEPENDENT_AMBULATORY_CARE_PROVIDER_SITE_OTHER): Payer: Medicare Other

## 2020-04-20 ENCOUNTER — Encounter: Payer: Self-pay | Admitting: Neurology

## 2020-04-20 ENCOUNTER — Ambulatory Visit (INDEPENDENT_AMBULATORY_CARE_PROVIDER_SITE_OTHER): Payer: Medicare Other | Admitting: Neurology

## 2020-04-20 ENCOUNTER — Other Ambulatory Visit: Payer: Self-pay

## 2020-04-20 VITALS — BP 143/88 | HR 117 | Resp 18 | Ht 68.0 in | Wt 164.0 lb

## 2020-04-20 DIAGNOSIS — R292 Abnormal reflex: Secondary | ICD-10-CM

## 2020-04-20 DIAGNOSIS — G7000 Myasthenia gravis without (acute) exacerbation: Secondary | ICD-10-CM | POA: Diagnosis not present

## 2020-04-20 DIAGNOSIS — R29898 Other symptoms and signs involving the musculoskeletal system: Secondary | ICD-10-CM

## 2020-04-20 LAB — SEDIMENTATION RATE: Sed Rate: 27 mm/hr — ABNORMAL HIGH (ref 0–20)

## 2020-04-20 LAB — C-REACTIVE PROTEIN: CRP: <1 mg/dL (ref 0.5–20.0)

## 2020-04-20 LAB — CK: Total CK: 51 U/L (ref 7–232)

## 2020-04-20 NOTE — Patient Instructions (Addendum)
CT lumbar spine without contrast  Check labs  Reduce prednisone 1 tablet in morning and 1 tablet in the afternoon for December, then 1 tablet in the morning and half-tablet in the afternoon  Your provider has requested that you have labwork completed today. Please go to [facility] ([id number]) on the second floor of this building before leaving the office today. You do not need to check in. If you are not called within 15 minutes please check with the front desk.

## 2020-04-20 NOTE — Progress Notes (Signed)
Follow-up Visit   Date: 04/20/20   Danny Walker MRN: [id number] DOB: 09-01-1945   Interim History: Danny Walker is a 74 y.o. Spanish-speaking male from [place] with history of hypertension, prostatitis, liver mass, and urinary retention returning to the clinic for with complaints of leg weakness.  He is followed here for seropositive myasthenia gravis.  The patient was accompanied to the clinic by Spanish interpretor.  History of present illness: Patient was hospitalized at [facility] from 11/11 - 04/12/2018 with 64-monthhistory of progressive difficulty swallowing, facial weakness, vertical double vision, and 30lb weight loss.  Due to high clinical suspicion for myasthenia gravis, he was started on mestinon 661mTID with marked improvement.  He necessitated brief placement of NG tube for feeding and medications.  MRI brain showed chronic microischemic changes.  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  He was started on prednisone 2066mnd titrated to 51m17m  He was on prednisone 51mg34mly and mestinon 51mg 64m times daily (4am, 10am, 4pm, 10am) since November 2019, which has successfully been tapered.  Fortunately, he has not had any relapse while coming down on prednisone and has been on 10mg s84m August 2020.  Due to leg cramps, his mestinon was reduced to 30mg BI53mich has helped.  Patient was doing well until July 2021 when he began having bulbar weakness.   UPDATE 03/16/2020:  He had two ER visits in October for weakness, however, based on his exam, it was not felt that weakness was stemming from MG exacerbation.  There was concern for STEMI, but patient left AMA without recommended cardiac work-up.  He returned to ER two days later with dizziness/lightheadedness and suffered a fall in the bathtub.  Orthostatic were positive.  CT head negative and again, no evidence of MG exacerbation.   He complains of  weakness in the legs.  He also complains of watery eyes.  He is compliant with prednisone 40mg/d a102mestinon 60-60-30.  He denies double vision, droopiness of the eyelids, difficulty swallowing/talking.   UPDATE 04/20/2020:  He is here for sooner follow-up because of ongoing weakness in the legs.  He is using a cane for assistance.  He expresses his frustration because there has been no change in symptoms, despite adjusting his mestinon and starting vitamin B12 injections.  At his last visit, I ordered MRI lumbar spine which patient cancelled.  When asked why, he says that he did not want to proceed due to claustrophobia.  He has been doing home PT, but not appreciated a marked change. He denies any dark colored urine or muscle pain. He has been taking prednisone 50mg/d an85mstinon 30mg three63mes daily. No bowel/bladder incontinence.  Medications:  Current Outpatient Medications on File Prior to Visit  Medication Sig Dispense Refill  . Calcium Carb-Cholecalciferol (OYSTER SHELL CALCIUM W/D) 500-200 MG-UNIT TABS Take 1 tablet by mouth 2 (two) times daily.    . calcium-vitamin D (OSCAL 500/200 D-3) 500-200 MG-UNIT tablet Take 1 tablet by mouth 2 (two) times daily. 60 tablet 11  . cephALEXin (KEFLEX) 500 MG capsule Take 500 mg by mouth 2 (two) times daily.    . cyanocobalamin (,VITAMIN B-12,) 1000 MCG/ML injection INYECTE 1ML EN EL MUSCULO UNA VEZ DURANTE 7 DIAS, LUEGO SEMANALMENTE DURANTE 4 SEMANAS Y LUEGO MENSUALMENTA DURANTE 1 ANO 10 mL 1  . diclofenac Sodium (VOLTAREN) 1 % GEL Apply 2 g topically 4 (four) times daily. (Patient  taking differently: Apply 2 g topically 4 (four) times daily as needed (pain). ) 100 g 3  . ferrous sulfate 325 (65 FE) MG tablet Take 1 tablet (325 mg total) by mouth daily. 30 tablet 3  . fesoterodine (TOVIAZ) 4 MG TB24 tablet Take 4 mg by mouth daily.    . finasteride (PROSCAR) 5 MG tablet Take 1 tablet (5 mg total) by mouth every morning. 90 tablet 3  . lisinopril  (ZESTRIL) 40 MG tablet TOME UNA TABLETA TODOS LOS DIAS (Patient taking differently: Take 40 mg by mouth daily. TOME UNA TABLETA TODOS LOS DIAS) 90 tablet 3  . naproxen (NAPROSYN) 250 MG tablet TOME UNA TABLETA DOS VECES AL DIA CUANDO SEA NECESARIO PARA EL DOLOR (Patient taking differently: Take 250 mg by mouth daily as needed (pain). ) 28 tablet 0  . NEEDLE, DISP, 25 G (BD ECLIPSE) 25G X 1-1/2" MISC Administer b12 injection daily for 7 days, then weekly for 4 weeks, then monthly for 1 year 10 each 1  . omeprazole (PRILOSEC) 40 MG capsule TOME UNA CAPSULA TODOS LOS DIAS EN LA MANANA (Patient taking differently: Take 40 mg by mouth daily. ) 90 capsule 3  . oxybutynin (DITROPAN) 5 MG tablet Take 5 mg by mouth daily.    . polyvinyl alcohol (LIQUIFILM TEARS) 1.4 % ophthalmic solution Place 1 drop into both eyes as needed for dry eyes. 15 mL 0  . predniSONE (DELTASONE) 20 MG tablet Take 1 tablet (20 mg total) by mouth 3 (three) times daily with meals. 90 tablet 2  . pyridostigmine (MESTINON) 60 MG tablet Take 0.5 tablets (30 mg total) by mouth in the morning AND 0.5 tablets (30 mg total) daily with lunch AND 0.5 tablets (30 mg total) at bedtime. 45 tablet 0  . Syringe/Needle, Disp, (SYRINGE 3CC/21GX1") 21G X 1" 3 ML MISC Administer b12 daily for 7 days, then weekly for 4 weeks, then monthly for 1 year. 10 each 1   No current facility-administered medications on file prior to visit.    Allergies: No Known Allergies  Vital Signs:  BP (!) 143/88   Pulse (!) 117   Resp 18   Ht 5' 8" (1.727 m)   Wt 164 lb (74.4 kg)   SpO2 95%   BMI 24.94 kg/m   Neurological Exam: MENTAL STATUS including orientation to time, place, person, recent and remote memory, attention span and concentration, language, and fund of knowledge is normal.  Speech is clear, no dysarthria.   CRANIAL NERVES:  Pupils are round and reactive. Normal conjugate, extra-ocular eye movements in all directions of gaze.  No ptosis at rest or  with sustained upgaze. No bulbar weakness.   MOTOR:  Motor strength is 5/5 in all extremities, except 4/5 bilateral hip flexion and 5-/5 hip adductors.  Neck flexion and extension is 5/5  REFLEXES:  Reflexes are 2+/4 throughout, except 3+/4 bilateral patella  SENSATION:  Vibration intact throughout  COORDINATION/GAIT:   Gait wide-based and stable. Unable to stand up without using arms to push off.   Data: MRI brain wo contrast 04/03/2018: 1. Discontinued examination due to patient's cessation of choking. 2. No acute intracranial abnormality. Chronic small vessel ischemia.  CT chest w contrast 04/07/2018: No evidence of anterior mediastinal mass to suggest thymoma. No acute cardiopulmonary disease.  Labs 04/04/2018:  AChR binding 7.86*, blocking 65*, modulating 57*, TSH 1.77, CK 75, HIV neg, RPR neg  Lab Results  Component Value Date   CKTOTAL 73 04/03/2018   Lab Results  Component Value Date   HGBA1C 6.5 06/25/2018   Lab Results  Component Value Date   VITAMINB12 182 (L) 03/16/2020     IMPRESSION/PLAN: 1.  Bilateral leg weakness, brisk patella reflexes, need to evaluate for lumbar spinal stenosis/radiculopathy  - Patient does not want MRI due to claustrophobia  - CT lumbar spine will be ordered, pt made aware that findings will be limited as compared to MRI  - Check ESR, CRP, CK, aldolase to screen for myopathy, since he is on prednisone.   - Continue home PT  2.  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 Therefore, I will start to slowly taper his prednisone  - Reduce prednisone to 80m/d x 1 month, then 384md  - Continue mestinon 3023mt 7a, noon, and 5p  3.  Vitamin B12 deficiency  - Continue monthly B12 injections  Long discussion with patient to address his concerns with our office policy of no-show and walk-in visits, patient did not want to sign form to accept our no show policy.   He was also very upset that I was unable to see him as a walk-in last week, as I had done on one occasion as a courtesy.  He was informed to call the office with questions/concerns so we can triage, as we do not have same-day walk-in appointments.  Return to clinic in 1 months  Total time spent reviewing records, interview, history/exam, documentation, and coordination of care on day of encounter:  40 min    Thank you for allowing me to participate in patient's care.  If I can answer any additional questions, I would be pleased to do so.    Sincerely,    Donika K. PatPosey ProntoO

## 2020-04-21 ENCOUNTER — Ambulatory Visit: Payer: Medicare Other | Admitting: Neurology

## 2020-04-21 LAB — ALDOLASE: Aldolase: 7.7 U/L (ref <=8.1)

## 2020-04-22 ENCOUNTER — Ambulatory Visit (INDEPENDENT_AMBULATORY_CARE_PROVIDER_SITE_OTHER): Payer: Medicare Other | Admitting: Internal Medicine

## 2020-04-22 ENCOUNTER — Other Ambulatory Visit: Payer: Self-pay

## 2020-04-22 ENCOUNTER — Encounter: Payer: Self-pay | Admitting: Internal Medicine

## 2020-04-22 VITALS — BP 137/75 | HR 107 | Temp 98.5°F | Ht 65.0 in | Wt 166.1 lb

## 2020-04-22 DIAGNOSIS — I10 Essential (primary) hypertension: Secondary | ICD-10-CM | POA: Diagnosis present

## 2020-04-22 MED ORDER — AMLODIPINE BESYLATE 5 MG PO TABS: 5.0000 mg | ORAL_TABLET | Freq: Every day | ORAL | 11 refills | Status: DC

## 2020-04-22 NOTE — Assessment & Plan Note (Signed)
BP Readings from Last 3 Encounters:  04/22/20 137/75  04/20/20 (!) 143/88  03/23/20 (!) 140/93   BP continues to be elevated. Orthostatics obtained by nursing due to patient having unsteadiness, which is being worked up by neurology. Orthostatic BP >150 sitting and standing.   - start norvasc 5mg , follow up in two weeks - continue lisinopril 40 mg qd

## 2020-04-22 NOTE — Progress Notes (Signed)
   CC: hypertension  HPI:  [name] is a 74 y.o. with PMH as below.   Please see A&P for assessment of the patient's acute and chronic medical conditions.    Past Medical History:  Diagnosis Date  . BPH (benign prostatic hyperplasia)   . DDD (degenerative disc disease), lumbosacral   . Fatty liver   . Foley catheter in place   . History of gallstones   . History of prostatitis   . Hypertension   . IDA (iron deficiency anemia)   . Liver mass 09/17/2001   4.7cm , noted on [place] abd  . Low back pain   . Myasthenia gravis ([place])   . Nocturia   . 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 Spondylosis Lumbar  . Umbilical hernia   . Urinary retention 02/08/2018  . Weakness of both legs 02/2020   Review of Systems:  Review of Systems  Respiratory: Negative for shortness of breath and wheezing.   Cardiovascular: Negative for chest pain, palpitations, orthopnea and leg swelling.  Neurological: Positive for weakness and headaches. Negative for dizziness and tingling.   Physical Exam: Constitution: NAD, appears stated age HENT: Eyes: no icterus or injection, red and +epiphora Cardio: RRR, no m/r/g, no LE edema  Respiratory: CTA, no w/r/r Abdominal: NTTP, soft, non-distended MSK: moving all extremities Neuro: normal affect, a&ox3 Skin: c/d/i    Vitals:   04/22/20 1508  BP: 137/75  Pulse: (!) 107  Temp: 98.5 F (36.9 C)  TempSrc: Oral  SpO2: 96%  Weight: 166 lb 1.6 oz (75.3 kg)  Height: 5\' 5"  (1.651 m)     Assessment & Plan:   See Encounters Tab for problem based charting.  Patient discussed with Dr. Dareen Piano

## 2020-04-22 NOTE — Patient Instructions (Signed)
Norvasc (amlodipine) 5 mg - toma una pastilla cada [employer]

## 2020-04-27 NOTE — Progress Notes (Signed)
Internal Medicine Clinic Attending  Case discussed with Dr. Seawell  At the time of the visit.  We reviewed the resident's history and exam and pertinent patient test results.  I agree with the assessment, diagnosis, and plan of care documented in the resident's note.  

## 2020-05-01 ENCOUNTER — Emergency Department (HOSPITAL_COMMUNITY): Payer: Medicare Other

## 2020-05-01 ENCOUNTER — Other Ambulatory Visit: Payer: Self-pay

## 2020-05-01 ENCOUNTER — Inpatient Hospital Stay (HOSPITAL_COMMUNITY)
Admission: EM | Admit: 2020-05-01 | Discharge: 2020-05-08 | DRG: 853 | Disposition: A | Discharge status: Rehabilitation Facility | Payer: Medicare Other | Attending: Physician Assistant | Admitting: Physician Assistant

## 2020-05-01 ENCOUNTER — Encounter (HOSPITAL_COMMUNITY): Payer: Self-pay | Admitting: Emergency Medicine

## 2020-05-01 ENCOUNTER — Inpatient Hospital Stay (HOSPITAL_COMMUNITY): Payer: Medicare Other | Admitting: Certified Registered Nurse Anesthetist

## 2020-05-01 ENCOUNTER — Other Ambulatory Visit (HOSPITAL_COMMUNITY): Payer: Self-pay

## 2020-05-01 ENCOUNTER — Encounter (HOSPITAL_COMMUNITY): Admission: EM | Disposition: A | Discharge status: Rehabilitation Facility | Payer: Self-pay | Source: Home / Self Care

## 2020-05-01 ENCOUNTER — Inpatient Hospital Stay (HOSPITAL_COMMUNITY): Payer: Medicare Other

## 2020-05-01 DIAGNOSIS — K5720 Diverticulitis of large intestine with perforation and abscess without bleeding: Secondary | ICD-10-CM | POA: Diagnosis present

## 2020-05-01 DIAGNOSIS — R6521 Severe sepsis with septic shock: Secondary | ICD-10-CM | POA: Diagnosis present

## 2020-05-01 DIAGNOSIS — G7000 Myasthenia gravis without (acute) exacerbation: Secondary | ICD-10-CM | POA: Diagnosis present

## 2020-05-01 DIAGNOSIS — D62 Acute posthemorrhagic anemia: Secondary | ICD-10-CM | POA: Diagnosis not present

## 2020-05-01 DIAGNOSIS — B961 Klebsiella pneumoniae [K. pneumoniae] as the cause of diseases classified elsewhere: Secondary | ICD-10-CM | POA: Diagnosis present

## 2020-05-01 DIAGNOSIS — K5792 Diverticulitis of intestine, part unspecified, without perforation or abscess without bleeding: Secondary | ICD-10-CM | POA: Diagnosis present

## 2020-05-01 DIAGNOSIS — Z79899 Other long term (current) drug therapy: Secondary | ICD-10-CM | POA: Diagnosis not present

## 2020-05-01 DIAGNOSIS — T380X5A Adverse effect of glucocorticoids and synthetic analogues, initial encounter: Secondary | ICD-10-CM | POA: Diagnosis present

## 2020-05-01 DIAGNOSIS — G9340 Encephalopathy, unspecified: Secondary | ICD-10-CM | POA: Diagnosis not present

## 2020-05-01 DIAGNOSIS — Z978 Presence of other specified devices: Secondary | ICD-10-CM

## 2020-05-01 DIAGNOSIS — I10 Essential (primary) hypertension: Secondary | ICD-10-CM | POA: Diagnosis present

## 2020-05-01 DIAGNOSIS — R5381 Other malaise: Secondary | ICD-10-CM | POA: Diagnosis not present

## 2020-05-01 DIAGNOSIS — A419 Sepsis, unspecified organism: Principal | ICD-10-CM | POA: Diagnosis present

## 2020-05-01 DIAGNOSIS — E872 Acidosis: Secondary | ICD-10-CM | POA: Diagnosis present

## 2020-05-01 DIAGNOSIS — D1809 Hemangioma of other sites: Secondary | ICD-10-CM | POA: Diagnosis present

## 2020-05-01 DIAGNOSIS — N390 Urinary tract infection, site not specified: Secondary | ICD-10-CM | POA: Diagnosis present

## 2020-05-01 DIAGNOSIS — Z8249 Family history of ischemic heart disease and other diseases of the circulatory system: Secondary | ICD-10-CM | POA: Diagnosis not present

## 2020-05-01 DIAGNOSIS — Z7952 Long term (current) use of systemic steroids: Secondary | ICD-10-CM

## 2020-05-01 DIAGNOSIS — K659 Peritonitis, unspecified: Secondary | ICD-10-CM

## 2020-05-01 DIAGNOSIS — J95821 Acute postprocedural respiratory failure: Secondary | ICD-10-CM | POA: Diagnosis not present

## 2020-05-01 DIAGNOSIS — N400 Enlarged prostate without lower urinary tract symptoms: Secondary | ICD-10-CM | POA: Diagnosis present

## 2020-05-01 DIAGNOSIS — R739 Hyperglycemia, unspecified: Secondary | ICD-10-CM

## 2020-05-01 DIAGNOSIS — Z20822 Contact with and (suspected) exposure to covid-19: Secondary | ICD-10-CM | POA: Diagnosis present

## 2020-05-01 HISTORY — DX: Diverticulitis of intestine, part unspecified, without perforation or abscess without bleeding: K57.92

## 2020-05-01 LAB — POCT I-STAT 7, (LYTES, BLD GAS, ICA,H+H)
Acid-base deficit: 1 mmol/L (ref 0.0–2.0)
Acid-base deficit: 2 mmol/L (ref 0.0–2.0)
Acid-base deficit: 1 mmol/L (ref 0.0–2.0)
Bicarbonate: 23.3 mmol/L (ref 20.0–28.0)
Bicarbonate: 26 mmol/L (ref 20.0–28.0)
Bicarbonate: 23.7 mmol/L (ref 20.0–28.0)
Calcium, Ion: 1.24 mmol/L (ref 1.15–1.40)
Calcium, Ion: 1.26 mmol/L (ref 1.15–1.40)
Calcium, Ion: 1.21 mmol/L (ref 1.15–1.40)
HCT: 29 % — ABNORMAL LOW (ref 39.0–52.0)
HCT: 30 % — ABNORMAL LOW (ref 39.0–52.0)
HCT: 31 % — ABNORMAL LOW (ref 39.0–52.0)
Hemoglobin: 10.2 g/dL — ABNORMAL LOW (ref 13.0–17.0)
Hemoglobin: 9.9 g/dL — ABNORMAL LOW (ref 13.0–17.0)
Hemoglobin: 10.5 g/dL — ABNORMAL LOW (ref 13.0–17.0)
O2 Saturation: 95 %
O2 Saturation: 97 %
O2 Saturation: 95 %
Patient temperature: 37.3
Patient temperature: 37.5
Potassium: 3.7 mmol/L (ref 3.5–5.1)
Potassium: 3.7 mmol/L (ref 3.5–5.1)
Potassium: 4 mmol/L (ref 3.5–5.1)
Sodium: 140 mmol/L (ref 135–145)
Sodium: 141 mmol/L (ref 135–145)
Sodium: 139 mmol/L (ref 135–145)
TCO2: 25 mmol/L (ref 22–32)
TCO2: 28 mmol/L (ref 22–32)
TCO2: 25 mmol/L (ref 22–32)
pCO2 arterial: 42.9 mmHg (ref 32.0–48.0)
pCO2 arterial: 55.1 mmHg — ABNORMAL HIGH (ref 32.0–48.0)
pCO2 arterial: 40.7 mmHg (ref 32.0–48.0)
pH, Arterial: 7.284 — ABNORMAL LOW (ref 7.350–7.450)
pH, Arterial: 7.344 — ABNORMAL LOW (ref 7.350–7.450)
pH, Arterial: 7.373 (ref 7.350–7.450)
pO2, Arterial: 102 mmHg (ref 83.0–108.0)
pO2, Arterial: 82 mmHg — ABNORMAL LOW (ref 83.0–108.0)
pO2, Arterial: 77 mmHg — ABNORMAL LOW (ref 83.0–108.0)

## 2020-05-01 LAB — URINALYSIS, ROUTINE W REFLEX MICROSCOPIC
Bilirubin Urine: NEGATIVE
Glucose, UA: NEGATIVE mg/dL
Ketones, ur: NEGATIVE mg/dL
Nitrite: NEGATIVE
Protein, ur: NEGATIVE mg/dL
Specific Gravity, Urine: 1.01 (ref 1.005–1.030)
pH: 6.5 (ref 5.0–8.0)

## 2020-05-01 LAB — BASIC METABOLIC PANEL
Anion gap: 13 (ref 5–15)
BUN: 17 mg/dL (ref 8–23)
CO2: 20 mmol/L — ABNORMAL LOW (ref 22–32)
Calcium: 8.1 mg/dL — ABNORMAL LOW (ref 8.9–10.3)
Chloride: 107 mmol/L (ref 98–111)
Creatinine, Ser: 0.96 mg/dL (ref 0.61–1.24)
GFR, Estimated: >60 mL/min (ref >60)
Glucose, Bld: 131 mg/dL — ABNORMAL HIGH (ref 70–99)
Potassium: 3.8 mmol/L (ref 3.5–5.1)
Sodium: 140 mmol/L (ref 135–145)

## 2020-05-01 LAB — COMPREHENSIVE METABOLIC PANEL
ALT: 41 U/L (ref 0–44)
AST: 29 U/L (ref 15–41)
Albumin: 2.9 g/dL — ABNORMAL LOW (ref 3.5–5.0)
Alkaline Phosphatase: 65 U/L (ref 38–126)
Anion gap: 13 (ref 5–15)
BUN: 19 mg/dL (ref 8–23)
CO2: 21 mmol/L — ABNORMAL LOW (ref 22–32)
Calcium: 8.6 mg/dL — ABNORMAL LOW (ref 8.9–10.3)
Chloride: 105 mmol/L (ref 98–111)
Creatinine, Ser: 1.06 mg/dL (ref 0.61–1.24)
GFR, Estimated: >60 mL/min (ref >60)
Glucose, Bld: 99 mg/dL (ref 70–99)
Potassium: 3.7 mmol/L (ref 3.5–5.1)
Sodium: 139 mmol/L (ref 135–145)
Total Bilirubin: 0.9 mg/dL (ref 0.3–1.2)
Total Protein: 5.4 g/dL — ABNORMAL LOW (ref 6.5–8.1)

## 2020-05-01 LAB — TYPE AND SCREEN
ABO/RH(D): B POS
Antibody Screen: NEGATIVE

## 2020-05-01 LAB — URINALYSIS, MICROSCOPIC (REFLEX)

## 2020-05-01 LAB — MRSA PCR SCREENING: MRSA by PCR: NEGATIVE

## 2020-05-01 LAB — CBC
HCT: 36.2 % — ABNORMAL LOW (ref 39.0–52.0)
HCT: 32.7 % — ABNORMAL LOW (ref 39.0–52.0)
Hemoglobin: 11.3 g/dL — ABNORMAL LOW (ref 13.0–17.0)
Hemoglobin: 10.8 g/dL — ABNORMAL LOW (ref 13.0–17.0)
MCH: 29.4 pg (ref 26.0–34.0)
MCH: 30.4 pg (ref 26.0–34.0)
MCHC: 31.2 g/dL (ref 30.0–36.0)
MCHC: 33 g/dL (ref 30.0–36.0)
MCV: 94.3 fL (ref 80.0–100.0)
MCV: 92.1 fL (ref 80.0–100.0)
Platelets: 122 10*3/uL — ABNORMAL LOW (ref 150–400)
Platelets: 128 10*3/uL — ABNORMAL LOW (ref 150–400)
RBC: 3.84 MIL/uL — ABNORMAL LOW (ref 4.22–5.81)
RBC: 3.55 MIL/uL — ABNORMAL LOW (ref 4.22–5.81)
RDW: 17.9 % — ABNORMAL HIGH (ref 11.5–15.5)
RDW: 18.2 % — ABNORMAL HIGH (ref 11.5–15.5)
WBC: 7.9 10*3/uL (ref 4.0–10.5)
WBC: 8.1 10*3/uL (ref 4.0–10.5)
nRBC: 0.4 % — ABNORMAL HIGH (ref 0.0–0.2)
nRBC: 0.4 % — ABNORMAL HIGH (ref 0.0–0.2)

## 2020-05-01 LAB — TRIGLYCERIDES: Triglycerides: 310 mg/dL — ABNORMAL HIGH (ref <150)

## 2020-05-01 LAB — PROTIME-INR
INR: 1.1 (ref 0.8–1.2)
Prothrombin Time: 14.2 seconds (ref 11.4–15.2)

## 2020-05-01 LAB — LACTIC ACID, PLASMA
Lactic Acid, Venous: 2.7 mmol/L (ref 0.5–1.9)
Lactic Acid, Venous: 3 mmol/L (ref 0.5–1.9)

## 2020-05-01 LAB — RESP PANEL BY RT-PCR (FLU A&B, COVID) ARPGX2
Influenza A by PCR: NEGATIVE
Influenza B by PCR: NEGATIVE
SARS Coronavirus 2 by RT PCR: NEGATIVE

## 2020-05-01 LAB — ABO/RH: ABO/RH(D): B POS

## 2020-05-01 LAB — APTT: aPTT: 27 seconds (ref 24–36)

## 2020-05-01 LAB — LIPASE, BLOOD: Lipase: 43 U/L (ref 11–51)

## 2020-05-01 SURGERY — LAPAROTOMY, EXPLORATORY
Anesthesia: General | Site: Abdomen

## 2020-05-01 MED ORDER — MIDAZOLAM HCL 2 MG/2ML IJ SOLN
INTRAMUSCULAR | Status: AC
  Filled 2020-05-01: qty 2

## 2020-05-01 MED ORDER — IOHEXOL 300 MG/ML  SOLN
100.0000 mL | Freq: Once | INTRAMUSCULAR | Status: AC | PRN
  Administered 2020-05-01: 100 mL via INTRAVENOUS

## 2020-05-01 MED ORDER — LIDOCAINE 2% (20 MG/ML) 5 ML SYRINGE
INTRAMUSCULAR | Status: DC | PRN
  Administered 2020-05-01: 60 mg via INTRAVENOUS

## 2020-05-01 MED ORDER — FENTANYL CITRATE (PF) 250 MCG/5ML IJ SOLN
INTRAMUSCULAR | Status: AC
  Filled 2020-05-01: qty 5

## 2020-05-01 MED ORDER — FENTANYL CITRATE (PF) 100 MCG/2ML IJ SOLN
25.0000 ug | INTRAMUSCULAR | Status: DC | PRN
  Administered 2020-05-01: 50 ug via INTRAVENOUS
  Administered 2020-05-01: 100 ug via INTRAVENOUS
  Filled 2020-05-01 (×2): qty 2

## 2020-05-01 MED ORDER — PYRIDOSTIGMINE BROMIDE 60 MG/5ML PO SOLN
30.0000 mg | Freq: Three times a day (TID) | ORAL | Status: DC
  Administered 2020-05-01 (×2): 30 mg
  Filled 2020-05-01 (×3): qty 2.5

## 2020-05-01 MED ORDER — PROPOFOL 1000 MG/100ML IV EMUL
5.0000 ug/kg/min | INTRAVENOUS | Status: DC
  Administered 2020-05-01: 25 ug/kg/min via INTRAVENOUS

## 2020-05-01 MED ORDER — CHLORHEXIDINE GLUCONATE 0.12 % MT SOLN
OROMUCOSAL | Status: AC
  Administered 2020-05-01: 15 mL
  Filled 2020-05-01: qty 15

## 2020-05-01 MED ORDER — PHENYLEPHRINE 40 MCG/ML (10ML) SYRINGE FOR IV PUSH (FOR BLOOD PRESSURE SUPPORT)
PREFILLED_SYRINGE | INTRAVENOUS | Status: DC | PRN
  Administered 2020-05-01: 80 ug via INTRAVENOUS
  Administered 2020-05-01: 160 ug via INTRAVENOUS
  Administered 2020-05-01 (×2): 120 ug via INTRAVENOUS

## 2020-05-01 MED ORDER — ONDANSETRON HCL 4 MG/2ML IJ SOLN: 4.0000 mg | INTRAMUSCULAR | Status: DC | PRN

## 2020-05-01 MED ORDER — ONDANSETRON HCL 4 MG/2ML IJ SOLN
INTRAMUSCULAR | Status: AC
  Filled 2020-05-01: qty 2

## 2020-05-01 MED ORDER — LACTATED RINGERS IV BOLUS (SEPSIS)
1000.0000 mL | Freq: Once | INTRAVENOUS | Status: AC
  Administered 2020-05-01: 1000 mL via INTRAVENOUS

## 2020-05-01 MED ORDER — FENTANYL CITRATE (PF) 100 MCG/2ML IJ SOLN
50.0000 ug | Freq: Once | INTRAMUSCULAR | Status: DC
  Filled 2020-05-01: qty 2

## 2020-05-01 MED ORDER — 0.9 % SODIUM CHLORIDE (POUR BTL) OPTIME
TOPICAL | Status: DC | PRN
  Administered 2020-05-01: 2000 mL

## 2020-05-01 MED ORDER — LACTATED RINGERS IV SOLN: INTRAVENOUS | Status: DC

## 2020-05-01 MED ORDER — CHLORHEXIDINE GLUCONATE 0.12% ORAL RINSE (MEDLINE KIT)
15.0000 mL | Freq: Two times a day (BID) | OROMUCOSAL | Status: DC
  Administered 2020-05-01 – 2020-05-07 (×10): 15 mL via OROMUCOSAL

## 2020-05-01 MED ORDER — ORAL CARE MOUTH RINSE
15.0000 mL | OROMUCOSAL | Status: DC
  Administered 2020-05-01 – 2020-05-03 (×16): 15 mL via OROMUCOSAL

## 2020-05-01 MED ORDER — PANTOPRAZOLE SODIUM 40 MG IV SOLR
40.0000 mg | Freq: Every day | INTRAVENOUS | Status: DC
  Administered 2020-05-01: 40 mg via INTRAVENOUS
  Filled 2020-05-01: qty 40

## 2020-05-01 MED ORDER — ENOXAPARIN SODIUM 40 MG/0.4ML ~~LOC~~ SOLN
40.0000 mg | SUBCUTANEOUS | Status: DC
  Administered 2020-05-02 – 2020-05-08 (×7): 40 mg via SUBCUTANEOUS
  Filled 2020-05-01 (×7): qty 0.4

## 2020-05-01 MED ORDER — SODIUM CHLORIDE 0.9 % IV SOLN
1.0000 g | INTRAVENOUS | Status: DC
  Administered 2020-05-01: 1 g via INTRAVENOUS
  Filled 2020-05-01 (×2): qty 10

## 2020-05-01 MED ORDER — ACETAMINOPHEN 10 MG/ML IV SOLN
1000.0000 mg | Freq: Four times a day (QID) | INTRAVENOUS | Status: AC
  Administered 2020-05-01 – 2020-05-02 (×4): 1000 mg via INTRAVENOUS
  Filled 2020-05-01 (×4): qty 100

## 2020-05-01 MED ORDER — ONDANSETRON HCL 4 MG/2ML IJ SOLN: 4.0000 mg | Freq: Four times a day (QID) | INTRAMUSCULAR | Status: DC | PRN

## 2020-05-01 MED ORDER — ONDANSETRON 4 MG PO TBDP: 4.0000 mg | ORAL_TABLET | Freq: Four times a day (QID) | ORAL | Status: DC | PRN

## 2020-05-01 MED ORDER — PROPOFOL 500 MG/50ML IV EMUL
INTRAVENOUS | Status: DC | PRN
  Administered 2020-05-01: 50 ug/kg/min via INTRAVENOUS

## 2020-05-01 MED ORDER — ESMOLOL HCL 100 MG/10ML IV SOLN
INTRAVENOUS | Status: AC
  Filled 2020-05-01: qty 10

## 2020-05-01 MED ORDER — METHYLPREDNISOLONE SODIUM SUCC 125 MG IJ SOLR
125.0000 mg | INTRAMUSCULAR | Status: AC
  Administered 2020-05-01: 125 mg via INTRAVENOUS
  Filled 2020-05-01: qty 2

## 2020-05-01 MED ORDER — KCL IN DEXTROSE-NACL 20-5-0.45 MEQ/L-%-% IV SOLN
INTRAVENOUS | Status: DC
  Filled 2020-05-01 (×3): qty 1000

## 2020-05-01 MED ORDER — LACTATED RINGERS IV BOLUS (SEPSIS)
1000.0000 mL | Freq: Once | INTRAVENOUS | Status: AC
Start: 2020-05-01 — End: 2020-05-01
  Administered 2020-05-01: 1000 mL via INTRAVENOUS

## 2020-05-01 MED ORDER — MIDAZOLAM HCL 2 MG/2ML IJ SOLN
INTRAMUSCULAR | Status: DC | PRN
  Administered 2020-05-01: 2 mg via INTRAVENOUS

## 2020-05-01 MED ORDER — ALBUMIN HUMAN 5 % IV SOLN: INTRAVENOUS | Status: DC | PRN

## 2020-05-01 MED ORDER — PROPOFOL 10 MG/ML IV BOLUS
INTRAVENOUS | Status: DC | PRN
  Administered 2020-05-01: 100 mg via INTRAVENOUS

## 2020-05-01 MED ORDER — DEXAMETHASONE SODIUM PHOSPHATE 10 MG/ML IJ SOLN
INTRAMUSCULAR | Status: AC
  Filled 2020-05-01: qty 1

## 2020-05-01 MED ORDER — VASOPRESSIN 20 UNIT/ML IV SOLN
INTRAVENOUS | Status: AC
  Filled 2020-05-01: qty 1

## 2020-05-01 MED ORDER — MORPHINE SULFATE (PF) 2 MG/ML IV SOLN
1.0000 mg | INTRAVENOUS | Status: DC | PRN
  Administered 2020-05-03 (×2): 2 mg via INTRAVENOUS
  Filled 2020-05-01 (×2): qty 1

## 2020-05-01 MED ORDER — ONDANSETRON HCL 4 MG/2ML IJ SOLN
INTRAMUSCULAR | Status: DC | PRN
  Administered 2020-05-01: 4 mg via INTRAVENOUS

## 2020-05-01 MED ORDER — ROCURONIUM BROMIDE 10 MG/ML (PF) SYRINGE
PREFILLED_SYRINGE | INTRAVENOUS | Status: DC | PRN
  Administered 2020-05-01: 30 mg via INTRAVENOUS
  Administered 2020-05-01: 40 mg via INTRAVENOUS

## 2020-05-01 MED ORDER — SODIUM CHLORIDE 0.9 % IV SOLN
INTRAVENOUS | Status: DC | PRN
  Administered 2020-05-02: 1000 mL via INTRAVENOUS

## 2020-05-01 MED ORDER — ONDANSETRON HCL 4 MG/2ML IJ SOLN
4.0000 mg | Freq: Once | INTRAMUSCULAR | Status: AC
  Administered 2020-05-01: 4 mg via INTRAVENOUS
  Filled 2020-05-01: qty 2

## 2020-05-01 MED ORDER — FENTANYL 2500MCG IN NS 250ML (10MCG/ML) PREMIX INFUSION
0.0000 ug/h | INTRAVENOUS | Status: DC
  Filled 2020-05-01: qty 250

## 2020-05-01 MED ORDER — CHLORHEXIDINE GLUCONATE CLOTH 2 % EX PADS
6.0000 | MEDICATED_PAD | Freq: Every day | CUTANEOUS | Status: DC
  Administered 2020-05-01 – 2020-05-03 (×3): 6 via TOPICAL

## 2020-05-01 MED ORDER — LACTATED RINGERS IV SOLN: INTRAVENOUS | Status: DC | PRN

## 2020-05-01 MED ORDER — HYDROCORTISONE NA SUCCINATE PF 100 MG IJ SOLR
100.0000 mg | Freq: Three times a day (TID) | INTRAMUSCULAR | Status: DC
  Administered 2020-05-01 – 2020-05-02 (×3): 100 mg via INTRAVENOUS
  Filled 2020-05-01 (×3): qty 2

## 2020-05-01 MED ORDER — FENTANYL CITRATE (PF) 250 MCG/5ML IJ SOLN
INTRAMUSCULAR | Status: DC | PRN
  Administered 2020-05-01: 50 ug via INTRAVENOUS
  Administered 2020-05-01 (×2): 100 ug via INTRAVENOUS
  Administered 2020-05-01 (×2): 50 ug via INTRAVENOUS

## 2020-05-01 MED ORDER — PROPOFOL 10 MG/ML IV BOLUS
INTRAVENOUS | Status: AC
  Filled 2020-05-01: qty 20

## 2020-05-01 MED ORDER — PHENYLEPHRINE 40 MCG/ML (10ML) SYRINGE FOR IV PUSH (FOR BLOOD PRESSURE SUPPORT)
PREFILLED_SYRINGE | INTRAVENOUS | Status: AC
  Filled 2020-05-01: qty 10

## 2020-05-01 MED ORDER — METHYLPREDNISOLONE SODIUM SUCC 40 MG IJ SOLR: 32.0000 mg | Freq: Every day | INTRAMUSCULAR | Status: DC

## 2020-05-01 MED ORDER — DEXAMETHASONE SODIUM PHOSPHATE 10 MG/ML IJ SOLN
INTRAMUSCULAR | Status: DC | PRN
  Administered 2020-05-01: 8 mg via INTRAVENOUS

## 2020-05-01 MED ORDER — LIDOCAINE HCL (PF) 2 % IJ SOLN
INTRAMUSCULAR | Status: AC
  Filled 2020-05-01: qty 5

## 2020-05-01 MED ORDER — PIPERACILLIN-TAZOBACTAM 3.375 G IVPB
3.3750 g | Freq: Three times a day (TID) | INTRAVENOUS | Status: AC
  Administered 2020-05-01 – 2020-05-06 (×16): 3.375 g via INTRAVENOUS
  Filled 2020-05-01 (×15): qty 50

## 2020-05-01 MED ORDER — METRONIDAZOLE IN NACL 5-0.79 MG/ML-% IV SOLN
500.0000 mg | Freq: Once | INTRAVENOUS | Status: AC
  Administered 2020-05-01: 500 mg via INTRAVENOUS
  Filled 2020-05-01: qty 100

## 2020-05-01 MED ORDER — VASOPRESSIN 20 UNIT/ML IV SOLN
INTRAVENOUS | Status: DC | PRN
  Administered 2020-05-01 (×3): 5 [IU] via INTRAVENOUS

## 2020-05-01 MED ORDER — LACTATED RINGERS IV SOLN
INTRAVENOUS | Status: DC | PRN
Start: 2020-05-01 — End: 2020-05-01

## 2020-05-01 MED ORDER — ROCURONIUM BROMIDE 10 MG/ML (PF) SYRINGE
PREFILLED_SYRINGE | INTRAVENOUS | Status: AC
  Filled 2020-05-01: qty 10

## 2020-05-01 SURGICAL SUPPLY — 46 items
APL PRP STRL LF DISP 70% ISPRP (MISCELLANEOUS) ×1
BLADE CLIPPER SURG (BLADE) ×2 IMPLANT
CANISTER SUCT 3000ML PPV (MISCELLANEOUS) ×2 IMPLANT
CHLORAPREP W/TINT 26 (MISCELLANEOUS) ×2 IMPLANT
COVER SURGICAL LIGHT HANDLE (MISCELLANEOUS) ×2 IMPLANT
COVER WAND RF STERILE (DRAPES) ×2 IMPLANT
DRAPE LAPAROSCOPIC ABDOMINAL (DRAPES) ×2 IMPLANT
DRAPE WARM FLUID 44X44 (DRAPES) ×2 IMPLANT
DRSG OPSITE POSTOP 4X10 (GAUZE/BANDAGES/DRESSINGS) ×2 IMPLANT
DRSG OPSITE POSTOP 4X12 (GAUZE/BANDAGES/DRESSINGS) ×2 IMPLANT
DRSG OPSITE POSTOP 4X8 (GAUZE/BANDAGES/DRESSINGS) IMPLANT
ELECT BLADE 6.5 EXT (BLADE) ×4 IMPLANT
ELECT CAUTERY BLADE 6.4 (BLADE) ×4 IMPLANT
ELECT REM PT RETURN 9FT ADLT (ELECTROSURGICAL) ×2
ELECTRODE REM PT RTRN 9FT ADLT (ELECTROSURGICAL) ×1 IMPLANT
GLOVE BIO SURGEON STRL SZ7 (GLOVE) ×2 IMPLANT
GLOVE BIOGEL PI IND STRL 7.5 (GLOVE) ×1 IMPLANT
GLOVE BIOGEL PI INDICATOR 7.5 (GLOVE) ×1
GOWN STRL REUS W/ TWL LRG LVL3 (GOWN DISPOSABLE) ×2 IMPLANT
GOWN STRL REUS W/TWL LRG LVL3 (GOWN DISPOSABLE) ×4
HANDLE SUCTION POOLE (INSTRUMENTS) ×1 IMPLANT
KIT BASIN OR (CUSTOM PROCEDURE TRAY) ×2 IMPLANT
KIT OSTOMY DRAINABLE 2.75 STR (WOUND CARE) ×2 IMPLANT
KIT TURNOVER KIT B (KITS) ×2 IMPLANT
LIGASURE IMPACT 36 18CM CVD LR (INSTRUMENTS) ×2 IMPLANT
NS IRRIG 1000ML POUR BTL (IV SOLUTION) ×4 IMPLANT
PACK GENERAL/GYN (CUSTOM PROCEDURE TRAY) ×2 IMPLANT
PAD ARMBOARD 7.5X6 YLW CONV (MISCELLANEOUS) ×2 IMPLANT
PENCIL SMOKE EVACUATOR (MISCELLANEOUS) ×2 IMPLANT
RELOAD PROXIMATE 75MM BLUE (ENDOMECHANICALS) ×2 IMPLANT
SPECIMEN JAR LARGE (MISCELLANEOUS) ×2 IMPLANT
SPONGE LAP 18X18 RF (DISPOSABLE) ×2 IMPLANT
STAPLER CUT CVD 40MM GREEN (STAPLE) ×2 IMPLANT
STAPLER PROXIMATE 75MM BLUE (STAPLE) ×2 IMPLANT
STAPLER VISISTAT 35W (STAPLE) ×2 IMPLANT
SUCTION POOLE HANDLE (INSTRUMENTS) ×2
SUT PDS AB 1 TP1 96 (SUTURE) ×4 IMPLANT
SUT PROLENE 2 0 SH 30 (SUTURE) ×2 IMPLANT
SUT SILK 2 0 SH CR/8 (SUTURE) ×2 IMPLANT
SUT SILK 2 0 TIES 10X30 (SUTURE) ×2 IMPLANT
SUT SILK 3 0 SH CR/8 (SUTURE) ×2 IMPLANT
SUT SILK 3 0 TIES 10X30 (SUTURE) ×2 IMPLANT
SUT VIC AB 3-0 SH 18 (SUTURE) ×2 IMPLANT
TOWEL GREEN STERILE (TOWEL DISPOSABLE) ×2 IMPLANT
TRAY FOLEY MTR SLVR 16FR STAT (SET/KITS/TRAYS/PACK) ×2 IMPLANT
YANKAUER SUCT BULB TIP NO VENT (SUCTIONS) ×2 IMPLANT

## 2020-05-01 NOTE — ED Triage Notes (Signed)
Patient reports constipation for 2 days with generalized abdominal pain , no emesis or diarrhea , denies fever or chills .

## 2020-05-01 NOTE — H&P (Signed)
Danny [name] 05-Jan-1946  [id number].    Chief Complaint/Reason for Consult: perforated diverticulitis  HPI:  This is a 74 yo Hispanic male with a history of myasthenia gravis on chronic prednisone (40mg  total a day) and HTN, who began having issues moving his bowels yesterday.  His last BM was on Wednesday and he states it was normal.  No blood present.  No N/V.  He then developed abdominal pain.  He denies any CP, SOB, dysuria, etc.  His pain continued to worsen and around 2 am this morning got so bad he couldn't stand it.  He was brought to the ED and was in the waiting room for a while.  Unfortunately, he became unstable with hypotension and tachycardia.  He was then brought to a room where he was resuscitated with IVFs.  His BP has responded some to this and is now low 947M systolic. He is tachycardic in the 120s.  He has a normal WBC of 7.9.  He has a temp of 100.1.  He underwent a CT scan that reveals diverticulitis with retroperitoneal free air and some in the pelvis.  No abscess or phlegmon noted.  This RTP air tracks up to his mediastinum.  We have been asked to see him for further evaluations secondary to significant abdominal pain concerning for peritonitis.  History obtained via Optometrist.  ROS: ROS: Please see HPI, otherwise all other systems have been reviewed and are negative.  Family History  Problem Relation Age of Onset  . Hypertension Mother   . Hypertension Father     Past Medical History:  Diagnosis Date  . BPH (benign prostatic hyperplasia)   . DDD (degenerative disc disease), lumbosacral   . Fatty liver   . Foley catheter in place   . History of gallstones   . History of prostatitis   . Hypertension   . IDA (iron deficiency anemia)   . Liver mass 09/17/2001   4.7cm , noted on [place] abd  . Low back pain   . Myasthenia gravis ([name])   . Nocturia   . 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 Spondylosis Lumbar  . Umbilical hernia    . Urinary retention 02/08/2018  . Weakness of both legs 02/2020    Past Surgical History:  Procedure Laterality Date  . ERCP W/ SPHICTEROTOMY  09/17/2001  . INSERTION OF MESH N/A 02/05/2016   Procedure: INSERTION OF MESH, UMBILICAL HERNIA;  Surgeon: Armandina Gemma, MD;  Location: [place];  Service: General;  Laterality: N/A;  . LAPAROSCOPIC CHOLECYSTECTOMY  09/22/2001  . TRANSURETHRAL RESECTION OF PROSTATE N/A 03/05/2018   Procedure: TRANSURETHRAL RESECTION OF THE PROSTATE (TURP);  Surgeon: Cleon Gustin, MD;  Location: [facility];  Service: Urology;  Laterality: N/A;  . UMBILICAL HERNIA REPAIR N/A 02/05/2016   Procedure: UMBILICAL HERNIA REPAIR WITH MESH PATCH;  Surgeon: Armandina Gemma, MD;  Location: [facility];  Service: General;  Laterality: N/A;    Social History:  reports that he has never smoked. He has never used smokeless tobacco. He reports that he does not drink alcohol and does not use drugs.  Allergies: No Known Allergies  (Not in a hospital admission)    Physical Exam: Blood pressure (!) 103/59, pulse (!) 126, temperature 100.1 F (37.8 C), temperature source Rectal, resp. rate (!) 32, height 5\' 1"  (1.549 m), weight 83 kg, SpO2 96 %. General: pleasant, WD, WN Hispanic  male who is laying in bed in mild distress secondary to pain HEENT: head is normocephalic, atraumatic.  Sclera are noninjected.  PERRL.  Ears and nose without any masses or lesions.  Mouth is pink but dry  Heart: regular rhythm but tachy in the 120s.  Normal s1,s2. No obvious murmurs, gallops, or rubs noted.  Palpable radial and pedal pulses bilaterally Lungs: CTAB, no wheezes, rhonchi, or rales noted.  Respiratory effort nonlabored.  On 2L of [place], sats in mid 90s Abd: soft, very tender with peritoneal signs especially in lower abdomen, mild distention, absent BS, no masses, hernias, or organomegaly MS: all 4 extremities are symmetrical with no cyanosis,  clubbing, or edema. Skin: warm and dry with no masses, lesions, or rashes Neuro: Cranial nerves 2-12 grossly intact, sensation is normal throughout Psych: A&Ox3 with an appropriate affect.   Results for orders placed or performed during the hospital encounter of 05/01/20 (from the past 48 hour(s))  Urinalysis, Routine w reflex microscopic Urine, Clean Catch     Status: Abnormal   Collection Time: 05/01/20  3:21 AM  Result Value Ref Range   Color, Urine YELLOW YELLOW   APPearance CLOUDY (A) CLEAR   Specific Gravity, Urine 1.010 1.005 - 1.030   pH 6.5 5.0 - 8.0   Glucose, UA NEGATIVE NEGATIVE mg/dL   Hgb urine dipstick TRACE (A) NEGATIVE   Bilirubin Urine NEGATIVE NEGATIVE   Ketones, ur NEGATIVE NEGATIVE mg/dL   Protein, ur NEGATIVE NEGATIVE mg/dL   Nitrite NEGATIVE NEGATIVE   Leukocytes,Ua SMALL (A) NEGATIVE    Comment: Performed at [facility] [address], [place], [place] [address]  Urinalysis, Microscopic (reflex)     Status: Abnormal   Collection Time: 05/01/20  3:21 AM  Result Value Ref Range   RBC / HPF 0-5 0 - 5 RBC/hpf   WBC, UA 6-10 0 - 5 WBC/hpf   Bacteria, UA MANY (A) NONE SEEN   Squamous Epithelial / LPF 0-5 0 - 5    Comment: Performed at [facility], [place] [address] [place], [place] [address]  Lipase, blood     Status: None   Collection Time: 05/01/20  3:36 AM  Result Value Ref Range   Lipase 43 11 - 51 U/L    Comment: Performed at [facility] [address], [place], [place] [address]  Comprehensive metabolic panel     Status: Abnormal   Collection Time: 05/01/20  3:36 AM  Result Value Ref Range   Sodium 139 135 - 145 mmol/L   Potassium 3.7 3.5 - 5.1 mmol/L   Chloride 105 98 - 111 mmol/L   CO2 21 (L) 22 - 32 mmol/L   Glucose, Bld 99 70 - 99 mg/dL    Comment: Glucose reference range applies only to samples taken after fasting for at least 8 hours.   BUN 19 8 - 23 mg/dL   Creatinine, Ser 1.06 0.61 - 1.24 mg/dL   Calcium 8.6 (L) 8.9 -  10.3 mg/dL   Total Protein 5.4 (L) 6.5 - 8.1 g/dL   Albumin 2.9 (L) 3.5 - 5.0 g/dL   AST 29 15 - 41 U/L   ALT 41 0 - 44 U/L   Alkaline Phosphatase 65 38 - 126 U/L   Total Bilirubin 0.9 0.3 - 1.2 mg/dL   GFR, Estimated >60 >60 mL/min    Comment: (NOTE) Calculated using the CKD-EPI Creatinine Equation (2021)    Anion gap 13 5 - 15  Comment: Performed at [facility], [place] [address], [place], [place] [address]  CBC     Status: Abnormal   Collection Time: 05/01/20  3:36 AM  Result Value Ref Range   WBC 7.9 4.0 - 10.5 K/uL   RBC 3.84 (L) 4.22 - 5.81 MIL/uL   Hemoglobin 11.3 (L) 13.0 - 17.0 g/dL   HCT 36.2 (L) 39.0 - 52.0 %   MCV 94.3 80.0 - 100.0 fL   MCH 29.4 26.0 - 34.0 pg   MCHC 31.2 30.0 - 36.0 g/dL   RDW 17.9 (H) 11.5 - 15.5 %   Platelets 122 (L) 150 - 400 K/uL   nRBC 0.4 (H) 0.0 - 0.2 %    Comment: Performed at [facility] [address] [place], [place] [address]  Lactic acid, plasma     Status: Abnormal   Collection Time: 05/01/20  9:00 AM  Result Value Ref Range   Lactic Acid, Venous 2.7 (HH) 0.5 - 1.9 mmol/L    Comment: CRITICAL RESULT CALLED TO, READ BACK BY AND VERIFIED WITH: Eustaquio Boyden RN [phone] BY A BENNETT Performed at [facility], [place] [address] [place], [place] [address]   APTT     Status: None   Collection Time: 05/01/20  9:00 AM  Result Value Ref Range   aPTT 27 24 - 36 seconds    Comment: Performed at [facility] [address] [place] [address] [place], [place] [address]  Protime-INR     Status: None   Collection Time: 05/01/20  9:00 AM  Result Value Ref Range   Prothrombin Time 14.2 11.4 - 15.2 seconds   INR 1.1 0.8 - 1.2    Comment: (NOTE) INR goal varies based on device and disease states. Performed at [facility], [place] [address], [place], [place] [address]   Resp Panel by RT-PCR (Flu A&B, Covid) Nasopharyngeal Swab     Status: None   Collection Time: 05/01/20  9:52 AM   Specimen: Nasopharyngeal Swab; Nasopharyngeal(NP)  swabs in vial transport medium  Result Value Ref Range   SARS Coronavirus 2 by RT PCR NEGATIVE NEGATIVE    Comment: (NOTE) SARS-CoV-2 target nucleic acids are NOT DETECTED.  The SARS-CoV-2 RNA is generally detectable in upper respiratory specimens during the acute phase of infection. The lowest concentration of SARS-CoV-2 viral copies this assay can detect is 138 copies/mL. A negative result does not preclude SARS-Cov-2 infection and should not be used as the sole basis for treatment or other patient management decisions. A negative result may occur with  improper specimen collection/handling, submission of specimen other than nasopharyngeal swab, presence of viral mutation(s) within the areas targeted by this assay, and inadequate number of viral copies(<138 copies/mL). A negative result must be combined with clinical observations, patient history, and epidemiological information. The expected result is Negative.  Fact Sheet for Patients:  EntrepreneurPulse.com.au  Fact Sheet for Healthcare Providers:  IncredibleEmployment.be  This test is no t yet approved or cleared by the [place] FDA and  has been authorized for detection and/or diagnosis of SARS-CoV-2 by FDA under an Emergency Use Authorization (EUA). This EUA will remain  in effect (meaning this test can be used) for the duration of the COVID-19 declaration under Section 564(b)(1) of the Act, 21 U.S.C.section 360bbb-3(b)(1), unless the authorization is terminated  or revoked sooner.       Influenza A by PCR NEGATIVE NEGATIVE   Influenza B by PCR NEGATIVE NEGATIVE  Comment: (NOTE) The Xpert Xpress SARS-CoV-2/FLU/RSV plus assay is intended as an aid in the diagnosis of influenza from Nasopharyngeal swab specimens and should not be used as a sole basis for treatment. Nasal washings and aspirates are unacceptable for Xpert Xpress SARS-CoV-2/FLU/RSV testing.  Fact Sheet for  Patients: EntrepreneurPulse.com.au  Fact Sheet for Healthcare Providers: IncredibleEmployment.be  This test is not yet approved or cleared by the [place] FDA and has been authorized for detection and/or diagnosis of SARS-CoV-2 by FDA under an Emergency Use Authorization (EUA). This EUA will remain in effect (meaning this test can be used) for the duration of the COVID-19 declaration under Section 564(b)(1) of the Act, 21 U.S.C. section 360bbb-3(b)(1), unless the authorization is terminated or revoked.  Performed at [facility], [place] [address] [place], [place] [address]   Lactic acid, plasma     Status: Abnormal   Collection Time: 05/01/20 11:05 AM  Result Value Ref Range   Lactic Acid, Venous 3.0 (HH) 0.5 - 1.9 mmol/L    Comment: CRITICAL VALUE NOTED.  VALUE IS CONSISTENT WITH PREVIOUSLY REPORTED AND CALLED VALUE. Performed at [facility], [place] [address], [place], [place] [address]    CT ABDOMEN PELVIS W CONTRAST  Result Date: 05/01/2020 CLINICAL DATA:  Abdominal distension.  Bowel obstruction suspected. EXAM: CT ABDOMEN AND PELVIS WITH CONTRAST TECHNIQUE: Multidetector CT imaging of the abdomen and pelvis was performed using the standard protocol following bolus administration of intravenous contrast. CONTRAST:  168mL OMNIPAQUE IOHEXOL 300 MG/ML  SOLN COMPARISON:  Radiography same day FINDINGS: Lower chest: Mild pneumomediastinum tracking up from the retroperitoneum. Pendant atelectasis in both lower lobes. Hepatobiliary: Previous cholecystectomy. 1 cm cyst or hemangioma in the right lobe. Pancreas: Normal Spleen: Normal Adrenals/Urinary Tract: Adrenal glands are normal. Left kidney is normal. Right kidney contains a 3 cm cyst in the lower pole but is otherwise normal. Bladder is normal. Stomach/Bowel: I think that there is sigmoid diverticulitis with a perforated diverticulum resulting in a large amount of gas primarily in the  sigmoid mesocolon and retroperitoneum with some extension into the main mesentery. No free intraperitoneal air. No intraperitoneal fluid. Vascular/Lymphatic: Aortic atherosclerosis. No aneurysm. IVC is normal. No adenopathy. Reproductive: Enlarged prostate.  Possible TURP defect. Other: None Musculoskeletal: Scoliotic curvature and degenerative change of the spine. IMPRESSION: 1. Acute sigmoid diverticulitis with a perforated diverticulum resulting in a large amount of gas primarily in the sigmoid mesocolon and retroperitoneum with some extension into the main mesentery. No free intraperitoneal air. No intraperitoneal fluid. 2. Mild pneumomediastinum tracking up from the retroperitoneum. 3. Aortic atherosclerosis. 4. Enlarged prostate. Possible TURP defect. 5. Critical Value/emergent results were called by telephone at the time of interpretation on 05/01/2020 at 10:09 am to provider [facility] , who verbally acknowledged these results. Aortic Atherosclerosis (ICD10-I70.0). Electronically Signed   By: Nelson Chimes M.D.   On: 05/01/2020 10:07   DG Chest Port 1 View  Result Date: 05/01/2020 CLINICAL DATA:  Possible sepsis EXAM: PORTABLE CHEST 1 VIEW COMPARISON:  03/02/2020 FINDINGS: Low lung volumes. Minimal atelectasis/scarring at the lung bases. No significant pleural effusion. No pneumothorax. Stable cardiomediastinal contours with top-normal heart size. IMPRESSION: Minimal atelectasis/scarring at the lung bases. Electronically Signed   By: Macy Mis M.D.   On: 05/01/2020 09:42   DG Abd Portable 1 View  Result Date: 05/01/2020 CLINICAL DATA:  Questionable sepsis. EXAM: PORTABLE ABDOMEN - 1 VIEW COMPARISON:  04/06/2018 FINDINGS: There is no bowel dilation to suggest obstruction or significant adynamic  ileus. Status post cholecystectomy. Abdominal and pelvic soft tissues are otherwise unremarkable. No acute skeletal abnormality. IMPRESSION: 1. No acute findings.  No evidence of bowel obstruction.  Electronically Signed   By: Lajean Manes M.D.   On: 05/01/2020 09:42      Assessment/Plan Myasthenia gravis - on 40 mg of prednisone (20 breakfast and 20 at lunch) - will give stress dose of 125mg  peri-op and then 32mg  daily post op.  Will ask neurology to see patient for assistance. HTN - hypotensive currently, hold home meds  Septic shock secondary to perforated diverticulitis The patient has evidence of tachycardia and hypotension secondary to perforated diverticulitis with peritonitis.  We will plan to admit the patient and take him to the OR for Hartman's procedure given perforation and chronic steroid use.  This has been discussed with the patient in Spanish as well as his wife who speaks [place] on the phone.  He has received Rocephin/Flagyl.  We will switch this to Zosyn post operatively.  He is NPO.  To OR emergently for ex lap with colectomy/colostomy    FEN - NPO/IVFs VTE - will start post op ID - Rocephin/Flagyl Admit - inpatient  Henreitta Cea, [facility] 05/01/2020, 11:58 AM Please see Amion for pager number during day hours 7:00am-4:30pm or 7:00am -11:30am on weekends

## 2020-05-01 NOTE — Anesthesia Procedure Notes (Signed)
Arterial Line Insertion Start/End12/02/2020 1:30 PM, 05/01/2020 1:40 PM Performed by: Murvin Natal, MD, anesthesiologist  Patient location: OR. Preanesthetic checklist: patient identified, IV checked, site marked, risks and benefits discussed, surgical consent, monitors and equipment checked, pre-op evaluation, timeout performed and anesthesia consent Patient sedated Left, radial was placed Catheter size: 20 Fr Hand hygiene performed , maximum sterile barriers used  and Seldinger technique used  Attempts: 1 Procedure performed using ultrasound guided technique. Ultrasound Notes:anatomy identified, needle tip was noted to be adjacent to the nerve/plexus identified and no ultrasound evidence of intravascular and/or intraneural injection Following insertion, dressing applied and Biopatch. Post procedure assessment: normal and unchanged  Patient tolerated the procedure well with no immediate complications.

## 2020-05-01 NOTE — Progress Notes (Signed)
Asked to see patient by surgery due to hx MG. Rushed up to the OR holding area and pt had already been taken into surgery. Will see after surgery.  Clance Boll, NP Neuro

## 2020-05-01 NOTE — Anesthesia Procedure Notes (Signed)
Procedure Name: Intubation Date/Time: 05/01/2020 1:29 PM Performed by: Kathryne Hitch, CRNA Pre-anesthesia Checklist: Patient identified, Emergency Drugs available, Suction available and Patient being monitored Patient Re-evaluated:Patient Re-evaluated prior to induction Oxygen Delivery Method: Circle system utilized Preoxygenation: Pre-oxygenation with 100% oxygen Induction Type: IV induction Ventilation: Mask ventilation without difficulty Laryngoscope Size: Miller and 2 Grade View: Grade I Tube type: Oral Tube size: 7.5 mm Number of attempts: 1 Airway Equipment and Method: Stylet and Oral airway Placement Confirmation: ETT inserted through vocal cords under direct vision,  positive ETCO2 and breath sounds checked- equal and bilateral Secured at: 23 cm Tube secured with: Tape Dental Injury: Teeth and Oropharynx as per pre-operative assessment

## 2020-05-01 NOTE — Transfer of Care (Signed)
Immediate Anesthesia Transfer of Care Note  Patient: Danny [name]  Procedure(s) Performed: EXPLORATORY LAPAROTOMY (N/A Abdomen) COLON RESECTION SIGMOID (N/A Abdomen) COLOSTOMY (N/A Abdomen)  Patient Location: ICU  Anesthesia Type:General  Level of Consciousness: Patient remains intubated per anesthesia plan  Airway & Oxygen Therapy: Patient remains intubated per anesthesia plan and Patient placed on Ventilator (see vital sign flow sheet for setting)  Post-op Assessment: Report given to RN and Post -op Vital signs reviewed and stable  Post vital signs: Reviewed and stable  Last Vitals:  Vitals Value Taken Time  BP 116/73 05/01/20 1515  Temp    Pulse 111 05/01/20 1518  Resp 21 05/01/20 1518  SpO2 92 % 05/01/20 1518  Vitals shown include unvalidated device data.  Last Pain:  Vitals:   05/01/20 1238  TempSrc:   PainSc: 10-Worst pain ever      Patients Stated Pain Goal: 3 (76/70/11 0034)  Complications: No complications documented.

## 2020-05-01 NOTE — Progress Notes (Signed)
Elink following Code Sepsis. 

## 2020-05-01 NOTE — Op Note (Addendum)
Exploratory laparotomy, sigmoidectomy, end colostomy, Procedure Note  Indications: 74 yo Hispanic male with a history of myasthenia gravis on chronic prednisone (40mg  total a day) and HTN, who began having issues moving his bowels yesterday.  His last BM was on Wednesday and he states it was normal. No N/V.  He then developed abdominal pain.  His pain continued to worsen and around 2 am this morning got so bad he couldn't stand it.  He was brought to the ED and was in the waiting room for a while.  Unfortunately, he became unstable with hypotension and tachycardia.  He was then brought to a room where he was resuscitated with IVFs.  His BP has responded some to this and is now low 009F systolic. He is tachycardic in the 120s.  He has a normal WBC of 7.9.  He has a temp of 100.1.  He underwent a CT scan that reveals diverticulitis with retroperitoneal free air and some in the pelvis.  No abscess or phlegmon noted.  This RTP air tracks up to his mediastinum.   Pre-operative Diagnosis: Sigmoid colon perforation   Post-operative Diagnosis: Same  Surgeon: Donnie Mesa, MD  Assistants: Sheria Lang, MD  Anesthesia: General endotracheal anesthesia  ASA Class: 3  Procedure Details  The patient was seen again in the Holding Room. The risks, benefits, complications, treatment options, and expected outcomes were discussed with the patient. The possibilities of reaction to medication, pulmonary aspiration, perforation of viscus, bleeding, recurrent infection, the need for additional procedures, failure to diagnose a condition, and creating a complication requiring transfusion or operation were discussed with the patient. The patient and/or family concurred with the proposed plan, giving informed consent. The site of surgery properly noted. The patient was taken to Operating Room, identified as Danny [name]. A Time Out was held and the above information confirmed.  Prior to the induction of general  anesthesia, antibiotic prophylaxis was administered. General endotracheal anesthesia was then administered and tolerated well. After the induction, the abdomen was prepped with Chloraprep and draped in sterile fashion. The patient was positioned in the supine position.  A midline laparotomy was performed. Abdominal exploration was performed. His prior 4 cm umbilical mesh was removed. Small volume murky fluid was encountered. No gross spillage of enteric contents was observed. The small bowel was run from the ligament of Treitz to the ileocecal valve without pathology. The stomach and duodenum were inspected and appeared normal.  He has had a previous cholecystectomy and has omental adhesions to the gallbladder fossa.  The colon was inspected from the cecum to the rectum with a firm mass noted in the sigmoid colon. The sigmoid and left colon were mobilized from the lateral abdominal wall with a combination of electrocautery and blunt dissection. A healthy area of sigmoid colon was located, a defect was made in the mesentery, and the colon was transected with a linear GIA stapling device. The mesentery was divided inferiorly toward the rectum with bipolar energy. The IMA was identified and ligated between sutures. A posterior colon perforation was noted on the mesenteric aspect of the distal sigmoid colon. Once the posterior dissection was distal to the pathology at a area of healthy appearing proximal rectum, the proximal rectum was transected with a green Contour stapling device. The specimen was passed off for permanent pathology. Two Prolene sutures were secured to the rectal stump for future identification. Hemostasis was achieved with a combination of electrocautery and sutures. The pelvis was copiously irrigated with warm saline  solution.   The remaining left colon was further mobilized proximally. A stoma site was selected in the left mid abdomen and a circle of skin was resected with its underlying fat. A  cruciate incision was made in the anterior fascia with electrocautery, the rectus muscle was blunts split, and the posterior fascia was widely opened. The left colon was brought out of the defect in the abdominal wall for the end colostomy. The midline fascia was closed with two running #1 looped PDS sutures. The skin was irrigated and closed with staples. A sterile dressing was applied.   Attention was turned to maturation of the end colostomy. The epiploica were trimmed with electrocautery. The staple line was resected. The end colostomy was Brooked and matured in the standard fashion with Vicryl sutures. An ostomy appliance was applied.   Due to the patient's respiratory status, the decision was made to leave the patient intubated and transport him to the ICU intubated and sedated. Instrument, sponge, and needle counts were correct at closure and at the conclusion of the case.   Findings: Mesenteric sigmoid colon perforation   Estimated Blood Loss:  150 ml         Drains: None         Specimens: Sigmoid Colon            Complications: None; patient tolerated the procedure well.         Disposition: ICU - intubated and hemodynamically stable.         Condition: stable

## 2020-05-01 NOTE — Progress Notes (Signed)
TCT Melanee Spry, RN on [place]. Patient will transfer from OR to [facility]. Melanee Spry states she is taking report as of right now she's not sure which nurse will receive Mr. Anastasio Auerbach. Patient with ALine and has 20 and 18 gauge IV, Resp Vol 480, PEEP 5, Sats Low 90s.

## 2020-05-01 NOTE — ED Provider Notes (Addendum)
[place] EMERGENCY DEPARTMENT Provider Note   CSN: [id number] Arrival date & time: 05/01/20  7253     History Chief Complaint  Patient presents with  . Abdominal Pain    Constipation     Danny [name] is a 74 y.o. male.  Danny [name] is a 74 y.o. male with a history of cholecystectomy, BPH, fatty liver, IDA, myasthenia gravis, who presents to the emergency department for evaluation of abdominal pain and constipation. Patient states that 2 days he ate an apple that he thinks was bad, since then he has not been able to have a bowel movement and has been having worsening generalized abdominal pain. He denies any associated vomiting or fever. Prior to this he was having normal bowel movements. He denies any dysuria, urinary frequency or hematuria. Reports he feels generally weak and malaised. Denies chest pain or shortness of breath. Has had a prior cholecystectomy, hernia repair with mesh, and prostate resection. No meds prior to arrival. No other aggravating or alleviating factors.  Spanish translator used to obtain history.        Past Medical History:  Diagnosis Date  . BPH (benign prostatic hyperplasia)   . DDD (degenerative disc disease), lumbosacral   . Fatty liver   . Foley catheter in place   . History of gallstones   . History of prostatitis   . Hypertension   . IDA (iron deficiency anemia)   . Liver mass 09/17/2001   4.7cm , noted on [place] abd  . Low back pain   . Myasthenia gravis ([place])   . Nocturia   . 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 Spondylosis Lumbar  . Umbilical hernia   . Urinary retention 02/08/2018  . Weakness of both legs 02/2020    Patient Active Problem List   Diagnosis Date Noted  . Leg weakness 03/23/2020  . Visual disturbance 03/23/2020  . Normocytic anemia 03/10/2020  . Myasthenia gravis ([place]) 03/02/2020  . Myasthenia gravis with acute exacerbation ([place]) 01/24/2020  . Hip pain  09/30/2019  . Right shoulder pain 08/20/2019  . Health care maintenance 12/25/2018  . Gastroesophageal reflux disease 04/18/2018  . Essential hypertension 04/18/2018  . Protein-calorie malnutrition, severe 04/06/2018  . 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 Bulbar weakness ([place]) 04/03/2018  . Unintentional weight loss 04/03/2018  . BPH (benign prostatic hyperplasia) 03/05/2018  . Umbilical hernia 66/44/0347    Past Surgical History:  Procedure Laterality Date  . ERCP W/ SPHICTEROTOMY  09/17/2001  . INSERTION OF MESH N/A 02/05/2016   Procedure: INSERTION OF MESH, UMBILICAL HERNIA;  Surgeon: Armandina Gemma, MD;  Location: [place];  Service: General;  Laterality: N/A;  . LAPAROSCOPIC CHOLECYSTECTOMY  09/22/2001  . TRANSURETHRAL RESECTION OF PROSTATE N/A 03/05/2018   Procedure: TRANSURETHRAL RESECTION OF THE PROSTATE (TURP);  Surgeon: Cleon Gustin, MD;  Location[facility];  Service: Urology;  Laterality: N/A;  . UMBILICAL HERNIA REPAIR N/A 02/05/2016   Procedure: UMBILICAL HERNIA REPAIR WITH MESH PATCH;  Surgeon: Armandina Gemma, MD;  Location: [place];  Service: General;  Laterality: N/A;       Family History  Problem Relation Age of Onset  . Hypertension Mother   . Hypertension Father     Social History   Tobacco Use  . Smoking status: Never Smoker  . Smokeless tobacco: Never Used  Vaping Use  . Vaping Use: Never used  Substance  Use Topics  . Alcohol use: No  . Drug use: Never    Home Medications Prior to Admission medications   Medication Sig Start Date End Date Taking? Authorizing Provider  amLODipine (NORVASC) 5 MG tablet Take 1 tablet (5 mg total) by mouth daily. 04/22/20 04/22/21  Seawell, Jaimie A, DO  Calcium Carb-Cholecalciferol (OYSTER SHELL CALCIUM W/D) 500-200 MG-UNIT TABS Take 1 tablet by mouth 2 (two) times daily. 03/27/20   [provider]  calcium-vitamin D (OSCAL 500/200 D-3) 500-200  MG-UNIT tablet Take 1 tablet by mouth 2 (two) times daily. 01/06/20   Patel, Arvin Collard K, DO  cephALEXin (KEFLEX) 500 MG capsule Take 500 mg by mouth 2 (two) times daily. 04/12/20   [provider]  cyanocobalamin (,VITAMIN B-12,) 1000 MCG/ML injection INYECTE 1ML EN EL MUSCULO UNA VEZ DURANTE 7 DIAS, [place] 1 ANO 03/25/20   Patel, Arvin Collard K, DO  diclofenac Sodium (VOLTAREN) 1 % GEL Apply 2 g topically 4 (four) times daily. Patient taking differently: Apply 2 g topically 4 (four) times daily as needed (pain).  12/16/19   Mosetta Anis, MD  ferrous sulfate 325 (65 FE) MG tablet Take 1 tablet (325 mg total) by mouth daily. 03/23/20 03/23/21  [name], MD  fesoterodine (TOVIAZ) 4 MG TB24 tablet Take 4 mg by mouth daily.    [provider]  finasteride (PROSCAR) 5 MG tablet Take 1 tablet (5 mg total) by mouth every morning. 10/14/19   [name], MD  lisinopril (ZESTRIL) 40 MG tablet TOME UNA [place] LOS DIAS Patient taking differently: Take 40 mg by mouth daily. TOME UNA TABLETA TODOS LOS DIAS 12/16/19   Mosetta Anis, MD  naproxen (NAPROSYN) 250 MG tablet TOME UNA TABLETA DOS VECES AL DIA CUANDO SEA [place] PARA EL DOLOR Patient taking differently: Take 250 mg by mouth daily as needed (pain).  01/14/20   [name], MD  NEEDLE, DISP, 25 G (BD ECLIPSE) 25G X 1-1/2" MISC Administer b12 injection daily for 7 days, then weekly for 4 weeks, then monthly for 1 year 03/17/20   Narda Amber K, DO  omeprazole (PRILOSEC) 40 MG capsule TOME UNA CAPSULA TODOS LOS DIAS EN LA [place] Patient taking differently: Take 40 mg by mouth daily.  09/18/19   Narda Amber K, DO  oxybutynin (DITROPAN) 5 MG tablet Take 5 mg by mouth daily. 04/09/20   [provider]  polyvinyl alcohol (LIQUIFILM TEARS) 1.4 % ophthalmic solution Place 1 drop into both eyes as needed for dry eyes. 04/12/18   [place], Carley D, DO   predniSONE (DELTASONE) 20 MG tablet Take 1 tablet (20 mg total) by mouth 3 (three) times daily with meals. 02/20/20   Patel, Arvin Collard K, DO  pyridostigmine (MESTINON) 60 MG tablet Take 0.5 tablets (30 mg total) by mouth in the morning AND 0.5 tablets (30 mg total) daily with lunch AND 0.5 tablets (30 mg total) at bedtime. 03/23/20 05/09/20  [name], MD  Syringe/Needle, Disp, (SYRINGE 3CC/21GX1") 21G X 1" 3 ML MISC Administer b12 daily for 7 days, then weekly for 4 weeks, then monthly for 1 year. 03/17/20   Narda Amber K, DO    Allergies    Patient has no known allergies.  Review of Systems   Review of Systems  Constitutional: Negative for chills and fever.  HENT: Negative.   Respiratory: Negative for cough and shortness of breath.   Cardiovascular: Negative for chest pain.  Gastrointestinal: Positive for abdominal pain and constipation. Negative for blood in stool, diarrhea, nausea and vomiting.  Genitourinary: Negative for dysuria, flank pain, frequency and hematuria.  Musculoskeletal: Negative for arthralgias and myalgias.  Skin: Negative for color change and rash.  Neurological: Positive for weakness (Generalized).  All other systems reviewed and are negative.   Physical Exam Updated Vital Signs BP 102/61 (BP Location: Left Arm)   Pulse 97   Temp 99.2 F (37.3 C) (Oral)   Resp 18   Ht 5\' 1"  (1.549 m)   Wt 83 kg   SpO2 96%   BMI 34.57 kg/m   Physical Exam Vitals and nursing note reviewed.  Constitutional:      General: He is not in acute distress.    Appearance: He is well-developed and well-nourished. He is not diaphoretic.     Comments: Elderly gentleman, alert, appears uncomfortable but is in no acute distress.  HENT:     Head: Normocephalic and atraumatic.     Mouth/Throat:     Mouth: Mucous membranes are moist.     Pharynx: Oropharynx is clear.  Eyes:     General:        Right eye: No discharge.        Left eye: No discharge.  Cardiovascular:      Rate and Rhythm: Normal rate and regular rhythm.     Heart sounds: Normal heart sounds.  Pulmonary:     Effort: Pulmonary effort is normal. No respiratory distress.     Breath sounds: Normal breath sounds.     Comments: Respirations equal and unlabored, patient able to speak in full sentences, lungs clear to auscultation bilaterally  Abdominal:     General: Bowel sounds are decreased. There is distension.     Palpations: There is no mass.     Tenderness: There is generalized abdominal tenderness. There is guarding.     Hernia: No hernia is present.     Comments: Abdomen is  distended with significant generalized tenderness throughout the abdomen that does not localize to 1 area, bowel sounds present but a bit decreased, guarding with palpation throughout  Musculoskeletal:        General: No deformity.     Right lower leg: No edema.     Left lower leg: No edema.  Skin:    General: Skin is dry.  Neurological:     Mental Status: He is alert and oriented to person, place, and time.     Coordination: Coordination normal.     Comments: Speech is clear, able to follow commands Moves extremities without ataxia, coordination intact  Psychiatric:        Mood and Affect: Mood and affect and mood normal.        Behavior: Behavior normal.     ED Results / Procedures / Treatments   Labs (all labs ordered are listed, but only abnormal results are displayed) Labs Reviewed  COMPREHENSIVE METABOLIC PANEL - Abnormal; Notable for the following components:      Result Value   CO2 21 (*)    Calcium 8.6 (*)    Total Protein 5.4 (*)    Albumin 2.9 (*)    All other components within normal limits  CBC - Abnormal; Notable for the following components:   RBC 3.84 (*)    Hemoglobin 11.3 (*)    HCT 36.2 (*)    RDW 17.9 (*)    Platelets 122 (*)    nRBC 0.4 (*)    All  other components within normal limits  URINALYSIS, ROUTINE W REFLEX MICROSCOPIC - Abnormal; Notable for the following components:    APPearance CLOUDY (*)    Hgb urine dipstick TRACE (*)    Leukocytes,Ua SMALL (*)    All other components within normal limits  URINALYSIS, MICROSCOPIC (REFLEX) - Abnormal; Notable for the following components:   Bacteria, UA MANY (*)    All other components within normal limits  LACTIC ACID, PLASMA - Abnormal; Notable for the following components:   Lactic Acid, Venous 2.7 (*)    All other components within normal limits  RESP PANEL BY RT-PCR (FLU A&B, COVID) ARPGX2  URINE CULTURE  CULTURE, BLOOD (ROUTINE X 2)  CULTURE, BLOOD (ROUTINE X 2)  LIPASE, BLOOD  APTT  PROTIME-INR  LACTIC ACID, PLASMA    EKG None  Radiology CT ABDOMEN PELVIS W CONTRAST  Result Date: 05/01/2020 CLINICAL DATA:  Abdominal distension.  Bowel obstruction suspected. EXAM: CT ABDOMEN AND PELVIS WITH CONTRAST TECHNIQUE: Multidetector CT imaging of the abdomen and pelvis was performed using the standard protocol following bolus administration of intravenous contrast. CONTRAST:  162mL OMNIPAQUE IOHEXOL 300 MG/ML  SOLN COMPARISON:  Radiography same day FINDINGS: Lower chest: Mild pneumomediastinum tracking up from the retroperitoneum. Pendant atelectasis in both lower lobes. Hepatobiliary: Previous cholecystectomy. 1 cm cyst or hemangioma in the right lobe. Pancreas: Normal Spleen: Normal Adrenals/Urinary Tract: Adrenal glands are normal. Left kidney is normal. Right kidney contains a 3 cm cyst in the lower pole but is otherwise normal. Bladder is normal. Stomach/Bowel: I think that there is sigmoid diverticulitis with a perforated diverticulum resulting in a large amount of gas primarily in the sigmoid mesocolon and retroperitoneum with some extension into the main mesentery. No free intraperitoneal air. No intraperitoneal fluid. Vascular/Lymphatic: Aortic atherosclerosis. No aneurysm. IVC is normal. No adenopathy. Reproductive: Enlarged prostate.  Possible TURP defect. Other: None Musculoskeletal: Scoliotic curvature and  degenerative change of the spine. IMPRESSION: 1. Acute sigmoid diverticulitis with a perforated diverticulum resulting in a large amount of gas primarily in the sigmoid mesocolon and retroperitoneum with some extension into the main mesentery. No free intraperitoneal air. No intraperitoneal fluid. 2. Mild pneumomediastinum tracking up from the retroperitoneum. 3. Aortic atherosclerosis. 4. Enlarged prostate. Possible TURP defect. 5. Critical Value/emergent results were called by telephone at the time of interpretation on 05/01/2020 at 10:09 am to provider [facility] , who verbally acknowledged these results. Aortic Atherosclerosis (ICD10-I70.0). Electronically Signed   By: Nelson Chimes M.D.   On: 05/01/2020 10:07   DG Chest Port 1 View  Result Date: 05/01/2020 CLINICAL DATA:  Possible sepsis EXAM: PORTABLE CHEST 1 VIEW COMPARISON:  03/02/2020 FINDINGS: Low lung volumes. Minimal atelectasis/scarring at the lung bases. No significant pleural effusion. No pneumothorax. Stable cardiomediastinal contours with top-normal heart size. IMPRESSION: Minimal atelectasis/scarring at the lung bases. Electronically Signed   By: Macy Mis M.D.   On: 05/01/2020 09:42   DG Abd Portable 1 View  Result Date: 05/01/2020 CLINICAL DATA:  Questionable sepsis. EXAM: PORTABLE ABDOMEN - 1 VIEW COMPARISON:  04/06/2018 FINDINGS: There is no bowel dilation to suggest obstruction or significant adynamic ileus. Status post cholecystectomy. Abdominal and pelvic soft tissues are otherwise unremarkable. No acute skeletal abnormality. IMPRESSION: 1. No acute findings.  No evidence of bowel obstruction. Electronically Signed   By: Lajean Manes M.D.   On: 05/01/2020 09:42    Procedures .Critical Care Performed by: Jacqlyn Larsen, PA-C Authorized by: Jacqlyn Larsen, PA-C   Critical care provider statement:  Critical care time (minutes):  45   Critical care was necessary to treat or prevent imminent or life-threatening  deterioration of the following conditions:  Sepsis (Sepsis with perforated diverticulitis)   Critical care was time spent personally by me on the following activities:  Discussions with consultants, evaluation of patient's response to treatment, examination of patient, ordering and performing treatments and interventions, ordering and review of laboratory studies, ordering and review of radiographic studies, pulse oximetry, re-evaluation of patient's condition, obtaining history from patient or surrogate and review of old charts   (including critical care time)  Medications Ordered in ED Medications  fentaNYL (SUBLIMAZE) injection 50 mcg (0 mcg Intravenous Hold 05/01/20 0821)  lactated ringers infusion ( Intravenous New Bag/Given 05/01/20 0843)  cefTRIAXone (ROCEPHIN) 1 g in sodium chloride 0.9 % 100 mL IVPB (0 g Intravenous Stopped 05/01/20 0952)  metroNIDAZOLE (FLAGYL) IVPB 500 mg (500 mg Intravenous New Bag/Given 05/01/20 1100)  ondansetron (ZOFRAN) injection 4 mg (4 mg Intravenous Given 05/01/20 0821)  lactated ringers bolus 1,000 mL (0 mLs Intravenous Stopped 05/01/20 0952)    And  lactated ringers bolus 1,000 mL (0 mLs Intravenous Stopped 05/01/20 1058)    And  lactated ringers bolus 1,000 mL (1,000 mLs Intravenous New Bag/Given 05/01/20 1058)  iohexol (OMNIPAQUE) 300 MG/ML solution 100 mL (100 mLs Intravenous Contrast Given 05/01/20 0954)    ED Course  I have reviewed the triage vital signs and the nursing notes.  Pertinent labs & imaging results that were available during my care of the patient were reviewed by me and considered in my medical decision making (see chart for details).    MDM Rules/Calculators/A&P                         74 year old male arrives complaining of generalized abdominal pain and constipation.  While here in the ED patient's vitals were initially stable, but on recheck once patient was roomed he has become tachycardic with heart rates into the 120s and  hypotensive with blood pressure of 82/54, on rectal temp patient with low-grade fever of 100.1.  Labs obtained from triage show no leukocytosis, stable hemoglobin, CO2 of 21 but no other significant electrolyte derangements and normal renal and liver function, normal lipase but urinalysis with many bacteria and signs of infection.  This could potentially be source of infection but patient also has very tender abdomen with distention and guarding, with constipation concern for possible obstruction or other intra-abdominal pathology.  Code sepsis initiated and patient was given 30 cc/kg fluid bolus and IV Rocephin and Flagyl.  I have independently ordered, reviewed and interpreted all labs and imaging: CBC without leukocytosis, hemoglobin is stable CMP without significant electrolyte derangements, normal renal and liver function  Lactic acid collected after code sepsis was called, it is mildly elevated at 2.7, blood cultures pending as well  Patient's vital seem to be responding well to fluids he is now normotensive with blood pressure of 114/71, still with some tachycardia in the 110s.  Portable x-rays of the chest and abdomen with some atelectasis but no obvious obstructive pattern or explanation for patient's abdominal pain and distention.  10:07 AM called by Dr. Maree Erie with radiology regarding CT results which show acute sigmoid diverticulitis with perforation resulting in a large amount of gas primarily in the sigmoid mesocolon and retroperitoneum with some extension into the main mesentery.  No intraperitoneal free air or fluid, mild pneumomediastinum tracking up from the retroperitoneum.  Patient remains distended  with significant tenderness throughout the abdomen on exam concerning for peritonitis in the setting of CT results will consult general surgery.  I discussed CT results with patient using Spanish translator and also spoke with his stepdaughter who he asked me to explain results to over  the phone.  11:00AM surgery repage regarding consult  11:13 AM received call back from [facility] with general surgery in the OR regarding perforated diverticulitis, they will see and evaluate the patient, may require medicine admission but given peritonitis on exam they would like to see and evaluate the patient first.  Surgery team will admit the patient, he will be going directly to the OR, will likely need ICU care after surgery.  Final Clinical Impression(s) / ED Diagnoses Final diagnoses:  Perforation of sigmoid colon due to diverticulitis  Sepsis without acute organ dysfunction, due to unspecified organism [facility])    Rx / DC Orders ED Discharge Orders    None       Jacqlyn Larsen, PA-C 05/01/20 1142    Jacqlyn Larsen, PA-C 05/01/20 1201    Pattricia Boss, MD 05/01/20 1543    Pattricia Boss, MD 05/01/20 [phone]

## 2020-05-01 NOTE — Anesthesia Postprocedure Evaluation (Signed)
Anesthesia Post Note  Patient: Reginaldo Hazard  Procedure(s) Performed: EXPLORATORY LAPAROTOMY (N/A Abdomen) COLON RESECTION SIGMOID (N/A Abdomen) COLOSTOMY (N/A Abdomen)     Patient location during evaluation: ICU Anesthesia Type: General Level of consciousness: patient remains intubated per anesthesia plan Vital Signs Assessment: post-procedure vital signs reviewed and stable Respiratory status: patient remains intubated per anesthesia plan Cardiovascular status: blood pressure returned to baseline and stable Postop Assessment: no apparent nausea or vomiting Anesthetic complications: no   No complications documented.  Last Vitals:  Vitals:   05/01/20 1513 05/01/20 1545  BP:  133/74  Pulse:  (!) 107  Resp: 16 16  Temp:    SpO2: 92% 95%    Last Pain:  Vitals:   05/01/20 1238  TempSrc:   PainSc: 10-Worst pain ever                 Effie Berkshire

## 2020-05-01 NOTE — Anesthesia Preprocedure Evaluation (Addendum)
Anesthesia Evaluation  Patient identified by MRN, date of birth, ID band Patient awake    Reviewed: Allergy & Precautions, NPO status , Patient's Chart, lab work & pertinent test results  Airway Mallampati: III  TM Distance: >3 FB Neck ROM: Full    Dental  (+) Edentulous Lower, Edentulous Upper   Pulmonary neg pulmonary ROS,    Pulmonary exam normal breath sounds clear to auscultation       Cardiovascular hypertension, Pt. on medications  Rhythm:Regular Rate:Tachycardia  ECG: ST, rate 118   Neuro/Psych Myasthenia gravis   Neuromuscular disease negative psych ROS   GI/Hepatic Neg liver ROS, GERD  Medicated and Controlled,  Endo/Other  negative endocrine ROS  Renal/GU negative Renal ROS     Musculoskeletal  (+) Arthritis ,   Abdominal (+) + obese,   Peds  Hematology  (+) anemia , Thrombocytopenia    Anesthesia Other Findings perferated viscus  Reproductive/Obstetrics                            Anesthesia Physical Anesthesia Plan  ASA: III and emergent  Anesthesia Plan: General   Post-op Pain Management:    Induction: Intravenous  PONV Risk Score and Plan: 2 and Ondansetron, Dexamethasone and Treatment may vary due to age or medical condition  Airway Management Planned: Oral ETT  Additional Equipment:   Intra-op Plan:   Post-operative Plan: Extubation in OR and Possible Post-op intubation/ventilation  Informed Consent: I have reviewed the patients History and Physical, chart, labs and discussed the procedure including the risks, benefits and alternatives for the proposed anesthesia with the patient or authorized representative who has indicated his/her understanding and acceptance.     Interpreter used for [employer] Discussed with: CRNA  Anesthesia Plan Comments:        Anesthesia Quick Evaluation

## 2020-05-01 NOTE — Consult Note (Signed)
NAME:  Danny Danny [name], MRN:  [id number], DOB:  31-Aug-1945, LOS: 0 ADMISSION DATE:  05/01/2020, CONSULTATION DATE:  12/10  REFERRING MD:  Georgette Dover, CHIEF COMPLAINT:  Sepsis and critical care management    Brief History   89 yom w/ h/o MG s/p recent flare (currently working on pred taper). Admitted 12/10 w/ acute sigmoid diverticulitis w/ perforation and abd peritonitis. Went to OR for wash out, and hartmans. PCCM asked to assist w/ post op care.   History of present illness   74 year old Danny [name] w/ hx as per below. Presented to the ER 12/10 w/ cc: 2 d h/o abd pain and constipation. Work up in [place] included: CT abd: showing acute sigmoid diverticulitis w perforation and free air. No leukocytosis. Temp 100.1. Went to OR for exploratory lap/colectomy and resection w/a bd washout. PCCM asked to eval 12/10 while in ICU to assist w/ critical care management.   Past Medical History  Myasthenia gravis, Chronic prednisone 40mg /d (recent MG flare so currently on downward taper) HTN, DDD back, Fatty liver disease, Cholecystectomy   Significant Hospital Events   12/10 admitted. Went to OR.   Consults:  pccm consulted 12/10  Procedures:  oett 12/10 >  Radial art line 12/10 >   Significant Diagnostic Tests:  12/10 CT abd/pelvis: 1. Acute sigmoid diverticulitis with a perforated diverticulum resulting in a large amount of gas primarily in the sigmoid mesocolon and retroperitoneum with some extension into the main mesentery. No free intraperitoneal air. No intraperitoneal fluid. 2. Mild pneumomediastinum tracking up from the retroperitoneum.3. Aortic atherosclerosis. 4. Enlarged prostate. Possible TURP defect.  Micro Data:    Antimicrobials:  Zosyn 12/10>>>  Interim history/subjective:  Sedated on 50 propofol.  Objective   Blood pressure (Abnormal) 103/59, pulse (Abnormal) 126, temperature 100.1 F (37.8 C), temperature source Rectal, resp. rate (Abnormal) 32, height 5\' 1"  (1.549 m), weight 83  kg, SpO2 96 %.        Intake/Output Summary (Last 24 hours) at 05/01/2020 1439 Last data filed at 05/01/2020 1415 Gross per 24 hour  Intake 250 ml  Output 850 ml  Net -600 ml   Filed Weights   05/01/20 0327  Weight: 83 kg   Physical Exam: General: Danny Danny [name], resting in bed, in NAD. Neuro: Sedated, not responsive. HEENT: [place]/AT. Sclerae anicteric. ETT in place. Cardiovascular: RRR, no M/R/G.  Lungs: Respirations even and unlabored.  CTA bilaterally, No W/R/R. Abdomen: Mildline incision in place, dressings C/D/I.  Colostomy in place.  Abdomen soft, NT/ND.  Musculoskeletal: No gross deformities, no edema.  Skin: Intact, warm, no rashes.   Assessment & Plan:   Perforated sigmoid colon - s/p OR for ex lap with sigmoidectomy and end colostomy. - Post op care per CCS. - Continue empiric Zosyn. - Follow cultures.  Respiratory insufficiency - 2/2 above, s/p intubation. - Full vent support. - Wean sedation down then attempt SBT / PSV if able. - Might have prolonged weaning given hx of MG. - Bronchial hygiene. - Follow CXR.  Hx HTN. - Hold home Amlodipine, Lisinopril.  Hx myasthenia gravis (recently seen by neuro who increased pred to 40mg  QD x 1 month then taper to 30mg  daily and 30mg  mestinon TID). - Neurology consulted by CCS. - Change steroids to stress dose while acutely ill.  Hx liver mass. - Outpatient follow up.   Best practice (evaluated daily)  Diet: NPO Pain/Anxiety/Delirium protocol (if indicated): Propofol gtt / Fentanyl PRN.  VAP protocol (if indicated): In place DVT prophylaxis: PAS  GI prophylaxis: PPI Glucose control: NA Mobility: advance as tolerated last date of multidisciplinary goals of care discussion pending.  Family and staff present pending  Summary of discussion pending  Follow up goals of care discussion due 12/17 Code Status: full code  Disposition: to ICU after OR   Labs   CBC: Recent Labs  Lab 05/01/20 0336  WBC 7.9   HGB 11.3*  HCT 36.2*  MCV 94.3  PLT 122*    Basic Metabolic Panel: Recent Labs  Lab 05/01/20 0336  NA 139  K 3.7  CL 105  CO2 21*  GLUCOSE 99  BUN 19  CREATININE 1.06  CALCIUM 8.6*   GFR: Estimated Creatinine Clearance: 56.7 mL/min (by C-G formula based on SCr of 1.06 mg/dL). Recent Labs  Lab 05/01/20 0336 05/01/20 0900 05/01/20 1105  WBC 7.9  --   --   LATICACIDVEN  --  2.7* 3.0*    Liver Function Tests: Recent Labs  Lab 05/01/20 0336  AST 29  ALT 41  ALKPHOS 65  BILITOT 0.9  PROT 5.4*  ALBUMIN 2.9*   Recent Labs  Lab 05/01/20 0336  LIPASE 43   No results for input(s): AMMONIA in the last 168 hours.  ABG    Component Value Date/Time   TCO2 24 01/23/2018 1734     Coagulation Profile: Recent Labs  Lab 05/01/20 0900  INR 1.1    Cardiac Enzymes: No results for input(s): CKTOTAL, CKMB, CKMBINDEX, TROPONINI in the last 168 hours.  HbA1C: Hgb A1c MFr Bld  Date/Time Value Ref Range Status  06/25/2018 10:02 AM 6.5 4.6 - 6.5 % Final    Comment:    Glycemic Control Guidelines for People with Diabetes:Non Diabetic:  <6%Goal of Therapy: <7%Additional Action Suggested:  >8%     CBG: No results for input(s): GLUCAP in the last 168 hours.  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   Surgical History    Past Surgical History:  Procedure Laterality Date  . ERCP W/ SPHICTEROTOMY  09/17/2001  . INSERTION OF MESH N/A 02/05/2016   Procedure: INSERTION OF MESH, UMBILICAL  HERNIA;  Surgeon: Armandina Gemma, MD;  Location: [facility];  Service: General;  Laterality: N/A;  . LAPAROSCOPIC CHOLECYSTECTOMY  09/22/2001  . TRANSURETHRAL RESECTION OF PROSTATE N/A 03/05/2018   Procedure: TRANSURETHRAL RESECTION OF THE PROSTATE (TURP);  Surgeon: Cleon Gustin, MD;  Location: [facility];  Service: Urology;  Laterality: N/A;  . UMBILICAL HERNIA REPAIR N/A 02/05/2016   Procedure: UMBILICAL HERNIA REPAIR WITH MESH PATCH;  Surgeon: Armandina Gemma, MD;  Location: [facility];  Service: General;  Laterality: N/A;     Social History   reports that he has never smoked. He has never used smokeless tobacco. He reports that he does not drink alcohol and does not use drugs.   Family History   His family history includes Hypertension in his father and mother.   Allergies No Known Allergies   Home Medications  Prior to Admission medications   Medication Sig Start Date End Date Taking? Authorizing Provider  amLODipine (NORVASC) 5 MG tablet Take 1 tablet (5 mg total) by mouth daily. 04/22/20 04/22/21  Seawell, Jaimie A, DO  Calcium Carb-Cholecalciferol (OYSTER SHELL CALCIUM W/D) 500-200 MG-UNIT TABS Take 1 tablet by mouth 2 (two) times daily. 03/27/20   [provider]  calcium-vitamin D (OSCAL 500/200 D-3) 500-200 MG-UNIT tablet Take 1 tablet by mouth 2 (two) times daily. 01/06/20   Patel, Arvin Collard K, DO  cephALEXin (KEFLEX) 500 MG capsule Take 500 mg by mouth 2 (two) times daily. 04/12/20   [provider]  cyanocobalamin (,VITAMIN B-12,) 1000 MCG/ML injection INYECTE 1ML EN EL MUSCULO UNA VEZ DURANTE 7 DIAS, [place] 1 ANO 03/25/20   Patel, Arvin Collard K, DO  diclofenac Sodium (VOLTAREN) 1 % GEL Apply 2 g topically 4 (four) times daily. Patient taking differently: Apply 2 g topically 4 (four) times daily as needed (pain).  12/16/19   Mosetta Anis, MD  ferrous sulfate 325 (65  FE) MG tablet Take 1 tablet (325 mg total) by mouth daily. 03/23/20 03/23/21  [name], MD  fesoterodine (TOVIAZ) 4 MG TB24 tablet Take 4 mg by mouth daily.    [provider]  finasteride (PROSCAR) 5 MG tablet Take 1 tablet (5 mg total) by mouth every morning. 10/14/19   [name], MD  lisinopril (ZESTRIL) 40 MG tablet TOME UNA [place] LOS DIAS Patient taking differently: Take 40 mg by mouth daily. TOME UNA TABLETA TODOS LOS DIAS 12/16/19   Mosetta Anis, MD  naproxen (NAPROSYN) 250 MG tablet TOME UNA TABLETA DOS VECES AL DIA CUANDO SEA [place] PARA EL DOLOR Patient taking differently: Take 250 mg by mouth daily as needed (pain).  01/14/20   [name], MD  NEEDLE, DISP, 25 G (BD ECLIPSE) 25G X 1-1/2" MISC Administer b12 injection daily for 7 days, then weekly for 4 weeks, then monthly for 1 year 03/17/20   Narda Amber K, DO  omeprazole (PRILOSEC) 40 MG capsule TOME UNA CAPSULA TODOS LOS DIAS EN LA [place] Patient taking differently: Take 40 mg by mouth daily.  09/18/19   Narda Amber K, DO  oxybutynin (DITROPAN) 5 MG tablet Take 5 mg by mouth daily. 04/09/20   [provider]  polyvinyl alcohol (LIQUIFILM TEARS) 1.4 % ophthalmic solution Place 1 drop into both eyes as needed for dry eyes. 04/12/18   [place], Carley D, DO  predniSONE (DELTASONE) 20 MG tablet Take 1 tablet (20 mg total) by mouth 3 (three) times daily with meals. 02/20/20   Patel, Arvin Collard K, DO  pyridostigmine (MESTINON) 60 MG tablet Take 0.5 tablets (30 mg total) by mouth in the morning AND 0.5 tablets (30 mg total) daily with lunch AND 0.5 tablets (30 mg total) at bedtime. 03/23/20 05/09/20  [name], MD  Syringe/Needle, Disp, (SYRINGE 3CC/21GX1") 21G X 1" 3 ML MISC Administer b12 daily for 7 days, then weekly for 4 weeks, then monthly for 1 year. 03/17/20   Narda Amber K, DO     Critical care time: 35 min.    Montey Hora, [facility] 05/01/2020, 3:36 PM

## 2020-05-01 NOTE — Consult Note (Signed)
Neurology Consultation  Reason for Consult: Myasthenia Gravis on home medication, unable to tolerate PO following emergency surgery for bowel perforation. Referring Physician: Benedetto Goad, PA   CC: constipation, decreased PO intake, abdominal distention and diffuse tenderness    History is obtained from: chart review  HPI: Danny [name] is a 74 y.o. male with a history of BPH, myasthenia gravis on chronic Prednisone at 40mg  daily, and diverticulitis. CT abdomen was obtained and revealed a perforated sigmoid diverticulitis with abdominal free air. Patient was treated with IV antibiotics, IVF, and taken to emergency surgery. Neurology asked to follow to manage myasthenia gravis medications and monitor for complications/exacerbation.  ROS: A 14 point ROS is unable to be obtained due to altered mental status- patient sedated and intubated in the ICU following emergency surgery 12/10.   Past Medical History:  Diagnosis Date  . BPH (benign prostatic hyperplasia)   . DDD (degenerative disc disease), lumbosacral   . Fatty liver   . Foley catheter in place   . History of gallstones   . History of prostatitis   . Hypertension   . IDA (iron deficiency anemia)   . Liver mass 09/17/2001   4.7cm , noted on [place] abd  . Low back pain   . Myasthenia gravis ([name])   . Nocturia   . 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 Spondylosis Lumbar  . Umbilical hernia   . Urinary retention 02/08/2018  . Weakness of both legs 02/2020    Family History  Problem Relation Age of Onset  . Hypertension Mother   . Hypertension Father    Social History:   reports that he has never smoked. He has never used smokeless tobacco. He reports that he does not drink alcohol and does not use drugs.  Medications  Current Facility-Administered Medications:  .  0.9 %  sodium chloride infusion, , Intravenous, PRN, Rigoberto Noel, MD .  acetaminophen (OFIRMEV) IV 1,000 mg, 1,000 mg,  Intravenous, Q6H, Saverio Danker, PA-C .  Chlorhexidine Gluconate Cloth 2 % PADS 6 each, 6 each, Topical, Daily, Donnie Mesa, MD .  dextrose 5 % and 0.45 % NaCl with KCl 20 mEq/L infusion, , Intravenous, Continuous, Saverio Danker, PA-C, Last Rate: 125 mL/hr at 05/01/20 1532, New Bag at 05/01/20 1532 .  [START ON 05/02/2020] enoxaparin (LOVENOX) injection 40 mg, 40 mg, Subcutaneous, Q24H, Saverio Danker, PA-C .  fentaNYL (SUBLIMAZE) injection 25-100 mcg, 25-100 mcg, Intravenous, Q2H PRN, Rigoberto Noel, MD, 100 mcg at 05/01/20 1555 .  fentaNYL 2553mcg in NS 251mL (22mcg/ml) infusion-PREMIX, 0-400 mcg/hr, Intravenous, Continuous, Saverio Danker, PA-C .  hydrocortisone sodium succinate (SOLU-CORTEF) 100 MG injection 100 mg, 100 mg, Intravenous, Q8H, Alva, Rakesh V, MD .  morphine 2 MG/ML injection 1-4 mg, 1-4 mg, Intravenous, Q2H PRN, Saverio Danker, PA-C .  ondansetron [facility]) injection 4 mg, 4 mg, Intravenous, Q4H PRN, Saverio Danker, PA-C .  ondansetron (ZOFRAN-ODT) disintegrating tablet 4 mg, 4 mg, Oral, Q6H PRN **OR** ondansetron (ZOFRAN) injection 4 mg, 4 mg, Intravenous, Q6H PRN, Saverio Danker, PA-C .  pantoprazole (PROTONIX) injection 40 mg, 40 mg, Intravenous, QHS, Saverio Danker, PA-C .  piperacillin-tazobactam (ZOSYN) IVPB 3.375 g, 3.375 g, Intravenous, Q8H, Saverio Danker, PA-C, Last Rate: 12.5 mL/hr at 05/01/20 1549, 3.375 g at 05/01/20 1549 .  propofol (DIPRIVAN) 1000 MG/100ML infusion, 5-80 mcg/kg/min, Intravenous, Titrated, Saverio Danker, PA-C, Last Rate: 12.45 mL/hr at 05/01/20 1559, 25 mcg/kg/min at 05/01/20 1559 .  pyridostigmine (MESTINON)  60 MG/5ML solution 30 mg, 30 mg, Per Tube, TID, Saverio Danker, PA-C  Exam: Current vital signs: BP 133/74   Pulse (!) 107   Temp 100.1 F (37.8 C) (Rectal)   Resp 16   Ht 5\' 7"  (1.702 m)   Wt 83 kg   SpO2 95%   BMI 28.66 kg/m  Vital signs in last 24 hours: Temp:  [99.2 F (37.3 C)-100.1 F (37.8 C)] 100.1 F (37.8 C) (12/10  0902) Pulse Rate:  [97-129] 107 (12/10 1545) Resp:  [16-33] 16 (12/10 1545) BP: (79-133)/(52-91) 133/74 (12/10 1545) SpO2:  [90 %-98 %] 95 % (12/10 1545) Arterial Line BP: (168)/(76) 168/76 (12/10 1545) FiO2 (%):  [50 %] 50 % (12/10 1513) Weight:  [83 kg] 83 kg (12/10 0327)  GENERAL: intubated and sedated  HEENT: - atraumatic, normocephalic, bilateral conjunctival chemosis noted R>L, ETT in place LUNGS - assisted ventilation, symmetric chest rise with inspiration CV - RRR on tele, warm extremities without edema, 2+ pedal pulses bilaterally ABDOMEN - non-distended, colostomy in place.  Ext: warm, well perfused  NEURO:  Mental Status: intubated and sedated, opens eyes to voice.  Speech/Language: unable to assess- intubated and sedated following emergency abdominal surgery Cranial Nerves:  II: PERRL3 mm/brisk.  II/IV/VI: Difficulty looking in wither direction.  V: Withdrawals to painful stimuli UE and LE. Moves extremities spontaneously.      Tone is normal. Bulk is normal.  Sensation- withdrawals to painful stimuli. Remains sedated following emergency procedure.  DTRs: 2+ bilaterally in UE and LE Gait- deferred  Labs I have reviewed labs in epic and the results pertinent to this consultation are: Triglycerides 310, lactatic acid 3.0  CBC    Component Value Date/Time   WBC 8.1 05/01/2020 1518   RBC 3.55 (L) 05/01/2020 1518   HGB 10.5 (L) 05/01/2020 1556   HGB 12.3 (L) 03/10/2020 1125   HCT 31.0 (L) 05/01/2020 1556   HCT 38.3 03/10/2020 1125   PLT 128 (L) 05/01/2020 1518   PLT 183 03/10/2020 1125   MCV 92.1 05/01/2020 1518   MCV 87 03/10/2020 1125   MCH 30.4 05/01/2020 1518   MCHC 33.0 05/01/2020 1518   RDW 18.2 (H) 05/01/2020 1518   RDW 19.1 (H) 03/10/2020 1125   LYMPHSABS 0.6 (L) 03/10/2020 1125   MONOABS 0.4 01/24/2020 1534   EOSABS 0.0 03/10/2020 1125   BASOSABS 0.0 03/10/2020 1125    CMP     Component Value Date/Time   NA 139 05/01/2020 1556   NA 142  02/03/2020 1504   K 4.0 05/01/2020 1556   CL 107 05/01/2020 1518   CO2 20 (L) 05/01/2020 1518   GLUCOSE 131 (H) 05/01/2020 1518   BUN 17 05/01/2020 1518   BUN 31 (H) 02/03/2020 1504   CREATININE 0.96 05/01/2020 1518   CREATININE 0.77 01/01/2013 1045   CALCIUM 8.1 (L) 05/01/2020 1518   PROT 5.4 (L) 05/01/2020 0336   PROT 7.5 02/03/2020 1504   ALBUMIN 2.9 (L) 05/01/2020 0336   ALBUMIN 3.7 02/03/2020 1504   AST 29 05/01/2020 0336   ALT 41 05/01/2020 0336   ALKPHOS 65 05/01/2020 0336   BILITOT 0.9 05/01/2020 0336   BILITOT 0.2 02/03/2020 1504   GFRNONAA >60 05/01/2020 1518   GFRAA 84 02/03/2020 1504    Lipid Panel     Component Value Date/Time   TRIG 310 (H) 05/01/2020 1518    Assessment:  Danny [name] is a 74 year old male with a history significant for myasthenia gravis on chronic prednisone (40mg   daily), BPH, and diverticulitis who presented to [facility] ED 12/10 with complaints of constipation, abdominal pain and tenderness, and decreased PO intake. CT was obtained and revealed a sigmoid colon perforation with free abdominal air. Danny [name] was stated on IVF, IV abx, and taken to emergency surgery.   Impression: Due to his emergent surgery, patient unable to take PO prednisone and is at risk for complications of Myasthenia Gravis. Neurology was consulted in order to manage Danny [name]'s myasthenia medications to prevent complications.   Recommendations: - Mestinon 30mg  solution per OGT TID - Stress dose steroids hydrocortisone 100mg  S2L to prevent complications from abruptly stopping chronic Prednisone therapy.  - Discuss with surgery the transition back to prednisone when tolerating PO   Pt seen by NP/Neuro and later by MD. Note/plan to be edited by MD as needed.  Anibal Henderson, AGAC-NP [facility] Pager: [phone]  I have seen the patient reviewed the above note.  Also, his lower extremity weakness was being investigated by his outpatient neurologist to ensure  there was not some concurrent spinal pathology masquerading.  An MRI of his L-spine has been ordered, though with relatively preserved reflexes, I would favor getting a thoracic spine as well.  With his history of severe claustrophobia, I think doing this while he is intubated would make sense, and may avoid overtreatment if lower extremity weakness is the major complaint after extubation.  I would not, however, delay extubation solely for the MR imaging.  Roland Rack, MD [facility] [phone]  If 7pm- 7am, please page neurology on call as listed in [place].

## 2020-05-02 LAB — CBC
HCT: 27.8 % — ABNORMAL LOW (ref 39.0–52.0)
Hemoglobin: 9 g/dL — ABNORMAL LOW (ref 13.0–17.0)
MCH: 30.5 pg (ref 26.0–34.0)
MCHC: 32.4 g/dL (ref 30.0–36.0)
MCV: 94.2 fL (ref 80.0–100.0)
Platelets: UNDETERMINED 10*3/uL (ref 150–400)
RBC: 2.95 MIL/uL — ABNORMAL LOW (ref 4.22–5.81)
RDW: 18.3 % — ABNORMAL HIGH (ref 11.5–15.5)
WBC: 10.8 10*3/uL — ABNORMAL HIGH (ref 4.0–10.5)
nRBC: 0 % (ref 0.0–0.2)

## 2020-05-02 LAB — COMPREHENSIVE METABOLIC PANEL
ALT: 123 U/L — ABNORMAL HIGH (ref 0–44)
AST: 79 U/L — ABNORMAL HIGH (ref 15–41)
Albumin: 2 g/dL — ABNORMAL LOW (ref 3.5–5.0)
Alkaline Phosphatase: 60 U/L (ref 38–126)
Anion gap: 10 (ref 5–15)
BUN: 19 mg/dL (ref 8–23)
CO2: 20 mmol/L — ABNORMAL LOW (ref 22–32)
Calcium: 7.4 mg/dL — ABNORMAL LOW (ref 8.9–10.3)
Chloride: 106 mmol/L (ref 98–111)
Creatinine, Ser: 1.23 mg/dL (ref 0.61–1.24)
GFR, Estimated: >60 mL/min (ref >60)
Glucose, Bld: 270 mg/dL — ABNORMAL HIGH (ref 70–99)
Potassium: 4.7 mmol/L (ref 3.5–5.1)
Sodium: 136 mmol/L (ref 135–145)
Total Bilirubin: 0.7 mg/dL (ref 0.3–1.2)
Total Protein: 4.2 g/dL — ABNORMAL LOW (ref 6.5–8.1)

## 2020-05-02 LAB — HEMOGLOBIN A1C
Hgb A1c MFr Bld: 7.1 % — ABNORMAL HIGH (ref 4.8–5.6)
Mean Plasma Glucose: 157.07 mg/dL

## 2020-05-02 LAB — GLUCOSE, CAPILLARY
Glucose-Capillary: 181 mg/dL — ABNORMAL HIGH (ref 70–99)
Glucose-Capillary: 117 mg/dL — ABNORMAL HIGH (ref 70–99)
Glucose-Capillary: 90 mg/dL (ref 70–99)
Glucose-Capillary: 90 mg/dL (ref 70–99)

## 2020-05-02 LAB — MAGNESIUM: Magnesium: 1.6 mg/dL — ABNORMAL LOW (ref 1.7–2.4)

## 2020-05-02 LAB — PHOSPHORUS: Phosphorus: 3.2 mg/dL (ref 2.5–4.6)

## 2020-05-02 MED ORDER — PYRIDOSTIGMINE BROMIDE 10 MG/2ML IV SOLN
1.0000 mg | Freq: Three times a day (TID) | INTRAVENOUS | Status: DC
  Administered 2020-05-02 – 2020-05-05 (×10): 1 mg via INTRAVENOUS
  Filled 2020-05-02 (×14): qty 0.2

## 2020-05-02 MED ORDER — PANTOPRAZOLE SODIUM 40 MG PO TBEC: 40.0000 mg | DELAYED_RELEASE_TABLET | Freq: Every day | ORAL | Status: DC

## 2020-05-02 MED ORDER — INSULIN ASPART 100 UNIT/ML ~~LOC~~ SOLN
0.0000 [IU] | SUBCUTANEOUS | Status: DC
  Administered 2020-05-02: 4 [IU] via SUBCUTANEOUS
  Administered 2020-05-03 – 2020-05-05 (×6): 3 [IU] via SUBCUTANEOUS
  Administered 2020-05-06: 4 [IU] via SUBCUTANEOUS
  Administered 2020-05-06 – 2020-05-07 (×4): 3 [IU] via SUBCUTANEOUS
  Administered 2020-05-08: 4 [IU] via SUBCUTANEOUS

## 2020-05-02 MED ORDER — PANTOPRAZOLE SODIUM 40 MG IV SOLR
40.0000 mg | Freq: Every day | INTRAVENOUS | Status: DC
  Administered 2020-05-02 – 2020-05-03 (×2): 40 mg via INTRAVENOUS
  Filled 2020-05-02 (×2): qty 40

## 2020-05-02 MED ORDER — PYRIDOSTIGMINE BROMIDE 60 MG PO TABS
30.0000 mg | ORAL_TABLET | Freq: Three times a day (TID) | ORAL | Status: DC
Start: 2020-05-02 — End: 2020-05-02
  Filled 2020-05-02: qty 0.5

## 2020-05-02 MED ORDER — MAGNESIUM SULFATE 2 GM/50ML IV SOLN
2.0000 g | Freq: Once | INTRAVENOUS | Status: AC
  Administered 2020-05-02: 2 g via INTRAVENOUS
  Filled 2020-05-02 (×2): qty 50

## 2020-05-02 MED ORDER — HYDROCORTISONE NA SUCCINATE PF 100 MG IJ SOLR
100.0000 mg | Freq: Two times a day (BID) | INTRAMUSCULAR | Status: DC
  Administered 2020-05-02: 100 mg via INTRAVENOUS
  Filled 2020-05-02: qty 2

## 2020-05-02 MED ORDER — SODIUM CHLORIDE 0.45 % IV SOLN: INTRAVENOUS | Status: DC

## 2020-05-02 NOTE — Progress Notes (Signed)
Patient ID: Danny Walker, male   DOB: 05-07-1946, 74 y.o.   MRN: [id number] [facility] Surgery Progress Note:   1 Day Post-Op  Subjective: Mental status is clear through his daughter as interpreter.  Complaints none. Objective: Vital signs in last 24 hours: Temp:  [97.8 F (36.6 C)-99.9 F (37.7 C)] 97.8 F (36.6 C) (12/11 0800) Pulse Rate:  [66-107] 77 (12/11 1100) Resp:  [16-22] 18 (12/11 1100) BP: (76-133)/(54-74) 97/66 (12/11 1100) SpO2:  [92 %-100 %] 97 % (12/11 1100) Arterial Line BP: (87-168)/(51-76) 129/63 (12/11 1100) FiO2 (%):  [32 %-50 %] 32 % (12/11 0845)  Intake/Output from previous day: 12/10 0701 - 12/11 0700 In: 3845.7 [I.V.:3177.2; IV Piggyback:668.5] Out: 1950 [Urine:1750; Blood:150] Intake/Output this shift: Total I/O In: 569 [I.V.:488.3; IV Piggyback:80.8] Out: -   Physical Exam: Work of breathing is OK for just being extubated.  He reports no difficulty.  His ostomy is serous.  Lab Results:  Results for orders placed or performed during the hospital encounter of 05/01/20 (from the past 48 hour(s))  Urinalysis, Routine w reflex microscopic Urine, Clean Catch     Status: Abnormal   Collection Time: 05/01/20  3:21 AM  Result Value Ref Range   Color, Urine YELLOW YELLOW   APPearance CLOUDY (A) CLEAR   Specific Gravity, Urine 1.010 1.005 - 1.030   pH 6.5 5.0 - 8.0   Glucose, UA NEGATIVE NEGATIVE mg/dL   Hgb urine dipstick TRACE (A) NEGATIVE   Bilirubin Urine NEGATIVE NEGATIVE   Ketones, ur NEGATIVE NEGATIVE mg/dL   Protein, ur NEGATIVE NEGATIVE mg/dL   Nitrite NEGATIVE NEGATIVE   Leukocytes,Ua SMALL (A) NEGATIVE    Comment: Performed at [facility] [address], [place], [place] [address]  Urinalysis, Microscopic (reflex)     Status: Abnormal   Collection Time: 05/01/20  3:21 AM  Result Value Ref Range   RBC / HPF 0-5 0 - 5 RBC/hpf   WBC, UA 6-10 0 - 5 WBC/hpf   Bacteria, UA MANY (A) NONE SEEN   Squamous Epithelial / LPF 0-5 0 - 5     Comment: Performed at [facility], [place] [address], [place], [place] [address]  Urine culture     Status: Abnormal (Preliminary result)   Collection Time: 05/01/20  3:21 AM   Specimen: Urine, Random  Result Value Ref Range   Specimen Description URINE, RANDOM    Special Requests NONE    Culture (A)     >=100,000 COLONIES/mL GRAM NEGATIVE RODS SUSCEPTIBILITIES TO FOLLOW Performed at [facility], [place] [address] [place], [place] [address]    Report Status PENDING   Lipase, blood     Status: None   Collection Time: 05/01/20  3:36 AM  Result Value Ref Range   Lipase 43 11 - 51 U/L    Comment: Performed at [facility], [place] [address], [place], [place] [address]  Comprehensive metabolic panel     Status: Abnormal   Collection Time: 05/01/20  3:36 AM  Result Value Ref Range   Sodium 139 135 - 145 mmol/L   Potassium 3.7 3.5 - 5.1 mmol/L   Chloride 105 98 - 111 mmol/L   CO2 21 (L) 22 - 32 mmol/L   Glucose, Bld 99 70 - 99 mg/dL    Comment: Glucose reference range applies only to samples taken after fasting for at least 8 hours.   BUN 19 8 - 23 mg/dL   Creatinine, Ser 1.06  0.61 - 1.24 mg/dL   Calcium 8.6 (L) 8.9 - 10.3 mg/dL   Total Protein 5.4 (L) 6.5 - 8.1 g/dL   Albumin 2.9 (L) 3.5 - 5.0 g/dL   AST 29 15 - 41 U/L   ALT 41 0 - 44 U/L   Alkaline Phosphatase 65 38 - 126 U/L   Total Bilirubin 0.9 0.3 - 1.2 mg/dL   GFR, Estimated >60 >60 mL/min    Comment: (NOTE) Calculated using the CKD-EPI Creatinine Equation (2021)    Anion gap 13 5 - 15    Comment: Performed at [facility] [address], [place], [place] [address]  CBC     Status: Abnormal   Collection Time: 05/01/20  3:36 AM  Result Value Ref Range   WBC 7.9 4.0 - 10.5 K/uL   RBC 3.84 (L) 4.22 - 5.81 MIL/uL   Hemoglobin 11.3 (L) 13.0 - 17.0 g/dL   HCT 36.2 (L) 39.0 - 52.0 %   MCV 94.3 80.0 - 100.0 fL   MCH 29.4 26.0 - 34.0 pg   MCHC 31.2 30.0 - 36.0 g/dL   RDW 17.9 (H) 11.5 - 15.5 %    Platelets 122 (L) 150 - 400 K/uL   nRBC 0.4 (H) 0.0 - 0.2 %    Comment: Performed at [facility] [address], [place], [place] [address]  ABO/Rh     Status: None   Collection Time: 05/01/20  3:36 AM  Result Value Ref Range   ABO/RH(D)      B POS Performed at [facility] [address], [place], [place] [address]   Lactic acid, plasma     Status: Abnormal   Collection Time: 05/01/20  9:00 AM  Result Value Ref Range   Lactic Acid, Venous 2.7 (HH) 0.5 - 1.9 mmol/L    Comment: CRITICAL RESULT CALLED TO, READ BACK BY AND VERIFIED WITH: Eustaquio Boyden RN [phone] BY A BENNETT Performed at [facility], [place] [address], [place], [place] [address]   APTT     Status: None   Collection Time: 05/01/20  9:00 AM  Result Value Ref Range   aPTT 27 24 - 36 seconds    Comment: Performed at [facility] [address], [place], [place] [address]  Blood Culture (routine x 2)     Status: None (Preliminary result)   Collection Time: 05/01/20  9:00 AM   Specimen: BLOOD  Result Value Ref Range   Specimen Description BLOOD SITE NOT SPECIFIED    Special Requests      BOTTLES DRAWN AEROBIC AND ANAEROBIC Blood Culture results may not be optimal due to an inadequate volume of blood received in culture bottles   Culture      NO GROWTH 1 DAY Performed at [facility], [place] [address], [place], [place] [address]    Report Status PENDING   Protime-INR     Status: None   Collection Time: 05/01/20  9:00 AM  Result Value Ref Range   Prothrombin Time 14.2 11.4 - 15.2 seconds   INR 1.1 0.8 - 1.2    Comment: (NOTE) INR goal varies based on device and disease states. Performed at [facility], [place] [address] [place], [place] [address]   Resp Panel by RT-PCR (Flu A&B, Covid) Nasopharyngeal Swab     Status: None   Collection Time: 05/01/20  9:52 AM   Specimen: Nasopharyngeal Swab; Nasopharyngeal(NP) swabs in vial transport  medium  Result Value Ref Range   SARS  Coronavirus 2 by RT PCR NEGATIVE NEGATIVE    Comment: (NOTE) SARS-CoV-2 target nucleic acids are NOT DETECTED.  The SARS-CoV-2 RNA is generally detectable in upper respiratory specimens during the acute phase of infection. The lowest concentration of SARS-CoV-2 viral copies this assay can detect is 138 copies/mL. A negative result does not preclude SARS-Cov-2 infection and should not be used as the sole basis for treatment or other patient management decisions. A negative result may occur with  improper specimen collection/handling, submission of specimen other than nasopharyngeal swab, presence of viral mutation(s) within the areas targeted by this assay, and inadequate number of viral copies(<138 copies/mL). A negative result must be combined with clinical observations, patient history, and epidemiological information. The expected result is Negative.  Fact Sheet for Patients:  EntrepreneurPulse.com.au  Fact Sheet for Healthcare Providers:  IncredibleEmployment.be  This test is no t yet approved or cleared by the [place] FDA and  has been authorized for detection and/or diagnosis of SARS-CoV-2 by FDA under an Emergency Use Authorization (EUA). This EUA will remain  in effect (meaning this test can be used) for the duration of the COVID-19 declaration under Section 564(b)(1) of the Act, 21 U.S.C.section 360bbb-3(b)(1), unless the authorization is terminated  or revoked sooner.       Influenza A by PCR NEGATIVE NEGATIVE   Influenza B by PCR NEGATIVE NEGATIVE    Comment: (NOTE) The Xpert Xpress SARS-CoV-2/FLU/RSV plus assay is intended as an aid in the diagnosis of influenza from Nasopharyngeal swab specimens and should not be used as a sole basis for treatment. Nasal washings and aspirates are unacceptable for Xpert Xpress SARS-CoV-2/FLU/RSV testing.  Fact Sheet for Patients: EntrepreneurPulse.com.au  Fact Sheet  for Healthcare Providers: IncredibleEmployment.be  This test is not yet approved or cleared by the [place] FDA and has been authorized for detection and/or diagnosis of SARS-CoV-2 by FDA under an Emergency Use Authorization (EUA). This EUA will remain in effect (meaning this test can be used) for the duration of the COVID-19 declaration under Section 564(b)(1) of the Act, 21 U.S.C. section 360bbb-3(b)(1), unless the authorization is terminated or revoked.  Performed at [facility], [place] [address] [place], [place] [address]   Lactic acid, plasma     Status: Abnormal   Collection Time: 05/01/20 11:05 AM  Result Value Ref Range   Lactic Acid, Venous 3.0 (HH) 0.5 - 1.9 mmol/L    Comment: CRITICAL VALUE NOTED.  VALUE IS CONSISTENT WITH PREVIOUSLY REPORTED AND CALLED VALUE. Performed at [facility], [place] [address] [place], [place] [address]   Type and screen [place]     Status: None   Collection Time: 05/01/20  1:41 PM  Result Value Ref Range   ABO/RH(D) B POS    Antibody Screen NEG    Sample Expiration      05/04/2020,2359 Performed at [facility], [place] [address] [place], [place] [address]   I-STAT 7, (LYTES, BLD GAS, ICA, H+H)     Status: Abnormal   Collection Time: 05/01/20  1:45 PM  Result Value Ref Range   pH, Arterial 7.284 (L) 7.350 - 7.450   pCO2 arterial 55.1 (H) 32.0 - 48.0 mmHg   pO2, Arterial 102 83.0 - 108.0 mmHg   Bicarbonate 26.0 20.0 - 28.0 mmol/L   TCO2 28 22 - 32 mmol/L   O2 Saturation 97.0 %   Acid-base deficit 1.0 0.0 -  2.0 mmol/L   Sodium 141 135 - 145 mmol/L   Potassium 3.7 3.5 - 5.1 mmol/L   Calcium, Ion 1.26 1.15 - 1.40 mmol/L   HCT 30.0 (L) 39.0 - 52.0 %   Hemoglobin 10.2 (L) 13.0 - 17.0 g/dL   Patient temperature 37.5 C    Sample type ARTERIAL   I-STAT 7, (LYTES, BLD GAS, ICA, H+H)     Status: Abnormal   Collection Time: 05/01/20  2:21 PM  Result Value Ref Range   pH, Arterial  7.344 (L) 7.350 - 7.450   pCO2 arterial 42.9 32.0 - 48.0 mmHg   pO2, Arterial 82 (L) 83.0 - 108.0 mmHg   Bicarbonate 23.3 20.0 - 28.0 mmol/L   TCO2 25 22 - 32 mmol/L   O2 Saturation 95.0 %   Acid-base deficit 2.0 0.0 - 2.0 mmol/L   Sodium 140 135 - 145 mmol/L   Potassium 3.7 3.5 - 5.1 mmol/L   Calcium, Ion 1.24 1.15 - 1.40 mmol/L   HCT 29.0 (L) 39.0 - 52.0 %   Hemoglobin 9.9 (L) 13.0 - 17.0 g/dL   Patient temperature 37.3 C    Sample type ARTERIAL   MRSA PCR Screening     Status: None   Collection Time: 05/01/20  3:12 PM   Specimen: Nasal Mucosa; Nasopharyngeal  Result Value Ref Range   MRSA by PCR NEGATIVE NEGATIVE    Comment:        The GeneXpert MRSA Assay (FDA approved for NASAL specimens only), is one component of a comprehensive MRSA colonization surveillance program. It is not intended to diagnose MRSA infection nor to guide or monitor treatment for MRSA infections. Performed at [facility], [place] [address] [place], [place] [address]   CBC     Status: Abnormal   Collection Time: 05/01/20  3:18 PM  Result Value Ref Range   WBC 8.1 4.0 - 10.5 K/uL   RBC 3.55 (L) 4.22 - 5.81 MIL/uL   Hemoglobin 10.8 (L) 13.0 - 17.0 g/dL   HCT 32.7 (L) 39.0 - 52.0 %   MCV 92.1 80.0 - 100.0 fL   MCH 30.4 26.0 - 34.0 pg   MCHC 33.0 30.0 - 36.0 g/dL   RDW 18.2 (H) 11.5 - 15.5 %   Platelets 128 (L) 150 - 400 K/uL    Comment: Immature Platelet Fraction may be clinically indicated, consider ordering this additional test [id number]    nRBC 0.4 (H) 0.0 - 0.2 %    Comment: Performed at [facility], [place] [address] [place], [place] [address]  Basic metabolic panel     Status: Abnormal   Collection Time: 05/01/20  3:18 PM  Result Value Ref Range   Sodium 140 135 - 145 mmol/L   Potassium 3.8 3.5 - 5.1 mmol/L   Chloride 107 98 - 111 mmol/L   CO2 20 (L) 22 - 32 mmol/L   Glucose, Bld 131 (H) 70 - 99 mg/dL    Comment: Glucose reference range applies only to samples taken after  fasting for at least 8 hours.   BUN 17 8 - 23 mg/dL   Creatinine, Ser 0.96 0.61 - 1.24 mg/dL   Calcium 8.1 (L) 8.9 - 10.3 mg/dL   GFR, Estimated >60 >60 mL/min    Comment: (NOTE) Calculated using the CKD-EPI Creatinine Equation (2021)    Anion gap 13 5 - 15    Comment: Performed at [facility] [address], [place], [place] [address]  Triglycerides  Status: Abnormal   Collection Time: 05/01/20  3:18 PM  Result Value Ref Range   Triglycerides 310 (H) <150 mg/dL    Comment: Performed at [facility] [address], [place], [place] [address]  I-STAT 7, (LYTES, BLD GAS, ICA, H+H)     Status: Abnormal   Collection Time: 05/01/20  3:56 PM  Result Value Ref Range   pH, Arterial 7.373 7.350 - 7.450   pCO2 arterial 40.7 32.0 - 48.0 mmHg   pO2, Arterial 77 (L) 83.0 - 108.0 mmHg   Bicarbonate 23.7 20.0 - 28.0 mmol/L   TCO2 25 22 - 32 mmol/L   O2 Saturation 95.0 %   Acid-base deficit 1.0 0.0 - 2.0 mmol/L   Sodium 139 135 - 145 mmol/L   Potassium 4.0 3.5 - 5.1 mmol/L   Calcium, Ion 1.21 1.15 - 1.40 mmol/L   HCT 31.0 (L) 39.0 - 52.0 %   Hemoglobin 10.5 (L) 13.0 - 17.0 g/dL   Collection site art line    Drawn by RT    Sample type ARTERIAL   Comprehensive metabolic panel     Status: Abnormal   Collection Time: 05/02/20  4:20 AM  Result Value Ref Range   Sodium 136 135 - 145 mmol/L   Potassium 4.7 3.5 - 5.1 mmol/L   Chloride 106 98 - 111 mmol/L   CO2 20 (L) 22 - 32 mmol/L   Glucose, Bld 270 (H) 70 - 99 mg/dL    Comment: Glucose reference range applies only to samples taken after fasting for at least 8 hours.   BUN 19 8 - 23 mg/dL   Creatinine, Ser 1.23 0.61 - 1.24 mg/dL   Calcium 7.4 (L) 8.9 - 10.3 mg/dL   Total Protein 4.2 (L) 6.5 - 8.1 g/dL   Albumin 2.0 (L) 3.5 - 5.0 g/dL   AST 79 (H) 15 - 41 U/L   ALT 123 (H) 0 - 44 U/L   Alkaline Phosphatase 60 38 - 126 U/L   Total Bilirubin 0.7 0.3 - 1.2 mg/dL   GFR, Estimated >60 >60 mL/min    Comment: (NOTE) Calculated  using the CKD-EPI Creatinine Equation (2021)    Anion gap 10 5 - 15    Comment: Performed at [facility], [place] [address] [place], [place] [address]  CBC     Status: Abnormal   Collection Time: 05/02/20  4:20 AM  Result Value Ref Range   WBC 10.8 (H) 4.0 - 10.5 K/uL   RBC 2.95 (L) 4.22 - 5.81 MIL/uL   Hemoglobin 9.0 (L) 13.0 - 17.0 g/dL   HCT 27.8 (L) 39.0 - 52.0 %   MCV 94.2 80.0 - 100.0 fL   MCH 30.5 26.0 - 34.0 pg   MCHC 32.4 30.0 - 36.0 g/dL   RDW 18.3 (H) 11.5 - 15.5 %   Platelets PLATELET CLUMPS NOTED ON SMEAR, UNABLE TO ESTIMATE 150 - 400 K/uL    Comment: Immature Platelet Fraction may be clinically indicated, consider ordering this additional test [id number]    nRBC 0.0 0.0 - 0.2 %    Comment: Performed at [facility], [place] [address] [place], [place] [address]  Magnesium     Status: Abnormal   Collection Time: 05/02/20  4:20 AM  Result Value Ref Range   Magnesium 1.6 (L) 1.7 - 2.4 mg/dL    Comment: Performed at [facility] [address], [place], [place] [address]  Phosphorus     Status:  None   Collection Time: 05/02/20  4:20 AM  Result Value Ref Range   Phosphorus 3.2 2.5 - 4.6 mg/dL    Comment: Performed at [facility], [place] [address] [place], [place] [address]  Hemoglobin A1c     Status: Abnormal   Collection Time: 05/02/20 10:46 AM  Result Value Ref Range   Hgb A1c MFr Bld 7.1 (H) 4.8 - 5.6 %    Comment: (NOTE) Pre diabetes:          5.7%-6.4%  Diabetes:              >6.4%  Glycemic control for   <7.0% adults with diabetes    Mean Plasma Glucose 157.07 mg/dL    Comment: Performed at [facility] [address] [place], [place] [address]  Glucose, capillary     Status: Abnormal   Collection Time: 05/02/20 11:29 AM  Result Value Ref Range   Glucose-Capillary 181 (H) 70 - 99 mg/dL    Comment: Glucose reference range applies only to samples taken after fasting for at least 8 hours.    Radiology/Results: CT ABDOMEN  PELVIS W CONTRAST  Result Date: 05/01/2020 CLINICAL DATA:  Abdominal distension.  Bowel obstruction suspected. EXAM: CT ABDOMEN AND PELVIS WITH CONTRAST TECHNIQUE: Multidetector CT imaging of the abdomen and pelvis was performed using the standard protocol following bolus administration of intravenous contrast. CONTRAST:  154mL OMNIPAQUE IOHEXOL 300 MG/ML  SOLN COMPARISON:  Radiography same day FINDINGS: Lower chest: Mild pneumomediastinum tracking up from the retroperitoneum. Pendant atelectasis in both lower lobes. Hepatobiliary: Previous cholecystectomy. 1 cm cyst or hemangioma in the right lobe. Pancreas: Normal Spleen: Normal Adrenals/Urinary Tract: Adrenal glands are normal. Left kidney is normal. Right kidney contains a 3 cm cyst in the lower pole but is otherwise normal. Bladder is normal. Stomach/Bowel: I think that there is sigmoid diverticulitis with a perforated diverticulum resulting in a large amount of gas primarily in the sigmoid mesocolon and retroperitoneum with some extension into the main mesentery. No free intraperitoneal air. No intraperitoneal fluid. Vascular/Lymphatic: Aortic atherosclerosis. No aneurysm. IVC is normal. No adenopathy. Reproductive: Enlarged prostate.  Possible TURP defect. Other: None Musculoskeletal: Scoliotic curvature and degenerative change of the spine. IMPRESSION: 1. Acute sigmoid diverticulitis with a perforated diverticulum resulting in a large amount of gas primarily in the sigmoid mesocolon and retroperitoneum with some extension into the main mesentery. No free intraperitoneal air. No intraperitoneal fluid. 2. Mild pneumomediastinum tracking up from the retroperitoneum. 3. Aortic atherosclerosis. 4. Enlarged prostate. Possible TURP defect. 5. Critical Value/emergent results were called by telephone at the time of interpretation on 05/01/2020 at 10:09 am to provider [facility] , who verbally acknowledged these results. Aortic Atherosclerosis (ICD10-I70.0).  Electronically Signed   By: Nelson Chimes M.D.   On: 05/01/2020 10:07   DG Chest Port 1 View  Result Date: 05/01/2020 CLINICAL DATA:  Endotracheal tube placement. EXAM: PORTABLE CHEST 1 VIEW COMPARISON:  Same day. FINDINGS: Stable cardiomegaly. Endotracheal tube is directed into right mainstem bronchus; withdrawal by 3-4 cm is recommended. Nasogastric tube is seen entering stomach. No pneumothorax is noted. Mild bibasilar subsegmental atelectasis is noted with probable small pleural effusion. Bony thorax is unremarkable. IMPRESSION: Endotracheal tube is directed into right mainstem bronchus; withdrawal by 3-4 cm is recommended. These results will be called to the ordering clinician or representative by the Radiologist Assistant, and communication documented in the PACS or zVision Dashboard. Mild bibasilar subsegmental atelectasis is noted with probable small pleural effusion.  Electronically Signed   By: Marijo Conception M.D.   On: 05/01/2020 15:49   DG Chest Port 1 View  Result Date: 05/01/2020 CLINICAL DATA:  Possible sepsis EXAM: PORTABLE CHEST 1 VIEW COMPARISON:  03/02/2020 FINDINGS: Low lung volumes. Minimal atelectasis/scarring at the lung bases. No significant pleural effusion. No pneumothorax. Stable cardiomediastinal contours with top-normal heart size. IMPRESSION: Minimal atelectasis/scarring at the lung bases. Electronically Signed   By: Macy Mis M.D.   On: 05/01/2020 09:42   DG Abd Portable 1 View  Result Date: 05/01/2020 CLINICAL DATA:  Questionable sepsis. EXAM: PORTABLE ABDOMEN - 1 VIEW COMPARISON:  04/06/2018 FINDINGS: There is no bowel dilation to suggest obstruction or significant adynamic ileus. Status post cholecystectomy. Abdominal and pelvic soft tissues are otherwise unremarkable. No acute skeletal abnormality. IMPRESSION: 1. No acute findings.  No evidence of bowel obstruction. Electronically Signed   By: Lajean Manes M.D.   On: 05/01/2020 09:42     Anti-infectives: Anti-infectives (From admission, onward)   Start     Dose/Rate Route Frequency Ordered Stop   05/01/20 1600  piperacillin-tazobactam (ZOSYN) IVPB 3.375 g        3.375 g 12.5 mL/hr over 240 Minutes Intravenous Every 8 hours 05/01/20 1508     05/01/20 1015  metroNIDAZOLE (FLAGYL) IVPB 500 mg        500 mg 100 mL/hr over 60 Minutes Intravenous  Once 05/01/20 1008 05/01/20 1212   05/01/20 0845  cefTRIAXone (ROCEPHIN) 1 g in sodium chloride 0.9 % 100 mL IVPB  Status:  Discontinued        1 g 200 mL/hr over 30 Minutes Intravenous Every 24 hours 05/01/20 0834 05/01/20 1508      Assessment/Plan: Problem List: Patient Active Problem List   Diagnosis Date Noted  . Diverticulitis 05/01/2020  . Endotracheally intubated   . Perforation of sigmoid colon due to diverticulitis   . Encephalopathy acute   . Peritonitis ([place])   . Leg weakness 03/23/2020  . Visual disturbance 03/23/2020  . Normocytic anemia 03/10/2020  . Myasthenia gravis ([place]) 03/02/2020  . Myasthenia gravis with acute exacerbation ([place]) 01/24/2020  . Hip pain 09/30/2019  . Right shoulder pain 08/20/2019  . Health care maintenance 12/25/2018  . Gastroesophageal reflux disease 04/18/2018  . Essential hypertension 04/18/2018  . Protein-calorie malnutrition, severe 04/06/2018  . 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 Bulbar weakness ([place]) 04/03/2018  . Unintentional weight loss 04/03/2018  . BPH (benign prostatic hyperplasia) 03/05/2018  . Umbilical hernia 83/25/4982    Sigmoid resection with Hartman--myasthenia gravis.  Improved 1 Day Post-Op    LOS: 1 day   Matt B. Hassell Done, MD, [facility] [phone] to reach the surgeon on call.    05/02/2020 12:06 PM

## 2020-05-02 NOTE — Progress Notes (Signed)
NAME:  Danny Walker, MRN:  [id number], DOB:  08-21-1945, LOS: 1 ADMISSION DATE:  05/01/2020, CONSULTATION DATE:  12/10  REFERRING MD:  Danny Walker, CHIEF COMPLAINT:  Sepsis and critical care management    Brief History   51 yom w/ h/o MG s/p recent flare (currently working on pred taper). Admitted 12/10 w/ acute sigmoid diverticulitis w/ perforation and abd peritonitis. Went to OR for wash out, and hartmans. PCCM asked to assist w/ post op care.   History of present illness   74 year old male w/ hx as per below. Presented to the ER 12/10 w/ cc: 2 d h/o abd pain and constipation. Work up in [place] included: CT abd: showing acute sigmoid diverticulitis w perforation and free air. No leukocytosis. Temp 100.1. Went to OR for exploratory lap/colectomy and resection w/a bd washout. PCCM asked to eval 12/10 while in ICU to assist w/ critical care management.   Past Medical History  Myasthenia gravis, Chronic prednisone 40mg /d (recent MG flare so currently on downward taper) HTN, DDD back, Fatty liver disease, Cholecystectomy   Significant Hospital Events   12/10 admitted. Went to OR.   Consults:  pccm consulted 12/10  Procedures:  oett 12/10 >  Radial art line 12/10 >   Significant Diagnostic Tests:  12/10 CT abd/pelvis: 1. Acute sigmoid diverticulitis with a perforated diverticulum resulting in a large amount of gas primarily in the sigmoid mesocolon and retroperitoneum with some extension into the main mesentery. No free intraperitoneal air. No intraperitoneal fluid. 2. Mild pneumomediastinum tracking up from the retroperitoneum.3. Aortic atherosclerosis. 4. Enlarged prostate. Possible TURP defect.  Micro Data:   [facility] 12/10 >>  Antimicrobials:  Zosyn 12/10>>>  Interim history/subjective:   No events overnight Mild hypertension On low-dose propofol   Objective   Blood pressure 93/64, pulse 72, temperature 98.2 F (36.8 C), temperature source Oral, resp. rate 16, height 5\' 7"  (1.702  m), weight 83 kg, SpO2 99 %.    Vent Mode: PRVC FiO2 (%):  [40 %-50 %] 40 % Set Rate:  [16 bmp] 16 bmp Vt Set:  [520 mL] 520 mL PEEP:  [5 cmH20] 5 cmH20 Plateau Pressure:  [16 cmH20-17 cmH20] 16 cmH20   Intake/Output Summary (Last 24 hours) at 05/02/2020 0826 Last data filed at 05/02/2020 0800 Gross per 24 hour  Intake 3987.47 ml  Output 1950 ml  Net 2037.47 ml   Filed Weights   05/01/20 0327  Weight: 83 kg   Physical Exam: General: [name], resting in bed, in NAD. Neuro: Awake, alert, follows commands, nonfocal, strength 4+/5 all 4 extremities HEENT: [place]/AT. Sclerae anicteric. ETT in place. Cardiovascular: RRR, no M/R/G.  Lungs: Bilateral ventilated breath sounds, no accessory muscle use Abdomen: Mildline incision in place, dressings C/D/I.  Colostomy in place.  Abdomen soft, NT/ND.  Musculoskeletal: No gross deformities, no edema.  Skin: Intact, warm, no rashes.   Chest x-ray 12/10 independently reviewed, ET tube in right mainstem, mild subsegmental atelectasis.  Labs show normal electrolytes, hyperglycemia, mild hypomagnesemia  Assessment & Plan:   Perforated sigmoid colon - s/p OR for ex lap with sigmoidectomy and end colostomy. - Post op care per CCS. - Continue empiric Zosyn. - Follow cultures.  Postop respiratory failure- 2/2 above, s/p intubation. -He is tolerating spontaneous breathing trials, proceed with extubation, no evidence of muscular weakness  Hx HTN. - Hold home Amlodipine, Lisinopril -mild hypotension likely related to intubation and propofol  Hx myasthenia gravis (recently seen by neuro who increased pred to  40mg  QD x 1 month then taper to 30mg  daily and 30mg  mestinon TID). - Neurology consulted by CCS. - Change steroids to hydrocortisone 100 every 8 -Discussed with neurology, outpatient neurologist had planned on MRI of LS spine, this can be delayed until he is evaluated by PT to see if he still has lower extremity weakness  Hx  liver mass. 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, doubt liver abscess  Hypomagnesemia will be repleted   Best practice (evaluated daily)  Diet: NPO Pain/Anxiety/Delirium protocol (if indicated): DC sedation for extubation VAP protocol (if indicated): In place DVT prophylaxis: PAS GI prophylaxis: PPI Glucose control: NA Mobility: advance as tolerated Code Status: full code  Disposition: to ICU after OR   Labs   CBC: Recent Labs  Lab 05/01/20 0336 05/01/20 1345 05/01/20 1421 05/01/20 1518 05/01/20 1556 05/02/20 0420  WBC 7.9  --   --  8.1  --  10.8*  HGB 11.3* 10.2* 9.9* 10.8* 10.5* 9.0*  HCT 36.2* 30.0* 29.0* 32.7* 31.0* 27.8*  MCV 94.3  --   --  92.1  --  94.2  PLT 122*  --   --  128*  --  PLATELET CLUMPS NOTED ON SMEAR, UNABLE TO ESTIMATE    Basic Metabolic Panel: Recent Labs  Lab 05/01/20 0336 05/01/20 1345 05/01/20 1421 05/01/20 1518 05/01/20 1556 05/02/20 0420  NA 139 141 140 140 139 136  K 3.7 3.7 3.7 3.8 4.0 4.7  CL 105  --   --  107  --  106  CO2 21*  --   --  20*  --  20*  GLUCOSE 99  --   --  131*  --  270*  BUN 19  --   --  17  --  19  CREATININE 1.06  --   --  0.96  --  1.23  CALCIUM 8.6*  --   --  8.1*  --  7.4*  MG  --   --   --   --   --  1.6*  PHOS  --   --   --   --   --  3.2   GFR: Estimated Creatinine Clearance: 55.2 mL/min (by C-G formula based on SCr of 1.23 mg/dL). Recent Labs  Lab 05/01/20 0336 05/01/20 0900 05/01/20 1105 05/01/20 1518 05/02/20 0420  WBC 7.9  --   --  8.1 10.8*  LATICACIDVEN  --  2.7* 3.0*  --   --     Liver Function Tests: Recent Labs  Lab 05/01/20 0336 05/02/20 0420  AST 29 79*  ALT 41 123*  ALKPHOS 65 60  BILITOT 0.9 0.7  PROT 5.4* 4.2*  ALBUMIN 2.9* 2.0*   Recent Labs  Lab 05/01/20 0336  LIPASE 43   No results for input(s): AMMONIA in the last 168 hours.  ABG    Component Value Date/Time   PHART 7.373 05/01/2020 1556   PCO2ART 40.7 05/01/2020 1556   PO2ART 77 (L) 05/01/2020 1556   HCO3 23.7  05/01/2020 1556   TCO2 25 05/01/2020 1556   ACIDBASEDEF 1.0 05/01/2020 1556   O2SAT 95.0 05/01/2020 1556      Critical care time: 35 min.     Danny Mead MD. Danny Walker. [facility] See Amion for pager  If no response to pager , please call [phone]  After 7:00 pm call Elink  [phone]   05/02/2020, 8:26 AM

## 2020-05-02 NOTE — Procedures (Signed)
Extubation Procedure Note  Patient Details:   Name: Danny Walker DOB: [birth year] MRN: [id number]   Airway Documentation:    Vent end date: 05/02/20 Vent end time: 0843   Evaluation  O2 sats: stable throughout Complications: No apparent complications Patient did tolerate procedure well. Bilateral Breath Sounds: Clear   Yes   Patient extubated per order to 3L [place] with no apparent complications. CCM had placed patient on wean earlier this AM and tolerated well. Patient achieved NIF of -40 and VC of .7L with good effort. Positive cuff leak was noted prior to extubation. Patient is alert and is able to speak. Vitals are stable. RT will continue to monitor.   Bayleigh Loflin Clyda Greener 05/02/2020, 8:47 AM

## 2020-05-02 NOTE — Progress Notes (Signed)
Late entry, much more awake this AM, good strength throughout. Continues on stress dose steroids. NIF -40. No changes at current time, will continue to follow.   Roland Rack, MD [facility] [phone]  If 7pm- 7am, please page neurology on call as listed in [place].

## 2020-05-03 LAB — BASIC METABOLIC PANEL
Anion gap: 9 (ref 5–15)
BUN: 19 mg/dL (ref 8–23)
CO2: 21 mmol/L — ABNORMAL LOW (ref 22–32)
Calcium: 7.5 mg/dL — ABNORMAL LOW (ref 8.9–10.3)
Chloride: 109 mmol/L (ref 98–111)
Creatinine, Ser: 1.09 mg/dL (ref 0.61–1.24)
GFR, Estimated: >60 mL/min (ref >60)
Glucose, Bld: 92 mg/dL (ref 70–99)
Potassium: 3.8 mmol/L (ref 3.5–5.1)
Sodium: 139 mmol/L (ref 135–145)

## 2020-05-03 LAB — CBC
HCT: 26.5 % — ABNORMAL LOW (ref 39.0–52.0)
Hemoglobin: 8.7 g/dL — ABNORMAL LOW (ref 13.0–17.0)
MCH: 30.4 pg (ref 26.0–34.0)
MCHC: 32.8 g/dL (ref 30.0–36.0)
MCV: 92.7 fL (ref 80.0–100.0)
Platelets: 114 10*3/uL — ABNORMAL LOW (ref 150–400)
RBC: 2.86 MIL/uL — ABNORMAL LOW (ref 4.22–5.81)
RDW: 18.1 % — ABNORMAL HIGH (ref 11.5–15.5)
WBC: 11.8 10*3/uL — ABNORMAL HIGH (ref 4.0–10.5)
nRBC: 0 % (ref 0.0–0.2)

## 2020-05-03 LAB — MAGNESIUM: Magnesium: 2.4 mg/dL (ref 1.7–2.4)

## 2020-05-03 LAB — URINE CULTURE: Culture: >=100000 — AB

## 2020-05-03 LAB — GLUCOSE, CAPILLARY
Glucose-Capillary: 85 mg/dL (ref 70–99)
Glucose-Capillary: 79 mg/dL (ref 70–99)
Glucose-Capillary: 80 mg/dL (ref 70–99)
Glucose-Capillary: 122 mg/dL — ABNORMAL HIGH (ref 70–99)
Glucose-Capillary: 136 mg/dL — ABNORMAL HIGH (ref 70–99)
Glucose-Capillary: 118 mg/dL — ABNORMAL HIGH (ref 70–99)

## 2020-05-03 LAB — PHOSPHORUS: Phosphorus: 2.8 mg/dL (ref 2.5–4.6)

## 2020-05-03 MED ORDER — NAPHAZOLINE-GLYCERIN 0.012-0.2 % OP SOLN
1.0000 [drp] | Freq: Four times a day (QID) | OPHTHALMIC | Status: DC | PRN
Start: 2020-05-03 — End: 2020-05-08
  Filled 2020-05-03 (×2): qty 15

## 2020-05-03 MED ORDER — DEXTROSE-NACL 5-0.45 % IV SOLN: INTRAVENOUS | Status: DC

## 2020-05-03 MED ORDER — OXYCODONE HCL 5 MG PO TABS
5.0000 mg | ORAL_TABLET | ORAL | Status: DC | PRN
  Administered 2020-05-03: 10 mg via ORAL
  Administered 2020-05-04: 5 mg via ORAL
  Filled 2020-05-03: qty 1
  Filled 2020-05-03 (×2): qty 2

## 2020-05-03 MED ORDER — KETOROLAC TROMETHAMINE 15 MG/ML IJ SOLN
15.0000 mg | Freq: Three times a day (TID) | INTRAMUSCULAR | Status: AC
  Administered 2020-05-03 – 2020-05-05 (×6): 15 mg via INTRAVENOUS
  Filled 2020-05-03 (×6): qty 1

## 2020-05-03 MED ORDER — METHYLPREDNISOLONE SODIUM SUCC 40 MG IJ SOLR
32.0000 mg | Freq: Every day | INTRAMUSCULAR | Status: DC
  Administered 2020-05-03 – 2020-05-05 (×3): 32 mg via INTRAVENOUS
  Filled 2020-05-03 (×3): qty 1

## 2020-05-03 NOTE — Progress Notes (Signed)
2 Days Post-Op  Subjective: Extubated yesterday. No bowel function yet. Up to chair this morning. Complains of pain.  Objective: Vital signs in last 24 hours: Temp:  [97.5 F (36.4 C)-98.6 F (37 C)] 97.8 F (36.6 C) (12/12 0800) Pulse Rate:  [64-97] 90 (12/12 1300) Resp:  [11-23] 21 (12/12 1300) BP: (87-125)/(56-79) 124/79 (12/12 1300) SpO2:  [94 %-100 %] 100 % (12/12 1300) Arterial Line BP: (124-176)/(64-82) 176/82 (12/12 0800)    Intake/Output from previous day: 12/11 0701 - 12/12 0700 In: 3295.6 [I.V.:3010.6; IV Piggyback:285] Out: 2050 [Urine:2050] Intake/Output this shift: Total I/O In: 651.8 [I.V.:605.6; IV Piggyback:46.1] Out: -   PE: General: resting comfortably, NAD Neuro: alert and oriented, no focal deficits Resp: normal work of breathing CV: RRR Abdomen: distended, minimally tender to palpation. Midline incision c/d/i. Colostomy pink and protuberant with bowel sweat in bag, no stool or gas. Extremities: warm and well-perfused   Lab Results:  Recent Labs    05/02/20 0420 05/03/20 0112  WBC 10.8* 11.8*  HGB 9.0* 8.7*  HCT 27.8* 26.5*  PLT PLATELET CLUMPS NOTED ON SMEAR, UNABLE TO ESTIMATE 114*   BMET Recent Labs    05/02/20 0420 05/03/20 0112  NA 136 139  K 4.7 3.8  CL 106 109  CO2 20* 21*  GLUCOSE 270* 92  BUN 19 19  CREATININE 1.23 1.09  CALCIUM 7.4* 7.5*   PT/INR Recent Labs    05/01/20 0900  LABPROT 14.2  INR 1.1   CMP     Component Value Date/Time   NA 139 05/03/2020 0112   NA 142 02/03/2020 1504   K 3.8 05/03/2020 0112   CL 109 05/03/2020 0112   CO2 21 (L) 05/03/2020 0112   GLUCOSE 92 05/03/2020 0112   BUN 19 05/03/2020 0112   BUN 31 (H) 02/03/2020 1504   CREATININE 1.09 05/03/2020 0112   CREATININE 0.77 01/01/2013 1045   CALCIUM 7.5 (L) 05/03/2020 0112   PROT 4.2 (L) 05/02/2020 0420   PROT 7.5 02/03/2020 1504   ALBUMIN 2.0 (L) 05/02/2020 0420   ALBUMIN 3.7 02/03/2020 1504   AST 79 (H) 05/02/2020 0420   ALT  123 (H) 05/02/2020 0420   ALKPHOS 60 05/02/2020 0420   BILITOT 0.7 05/02/2020 0420   BILITOT 0.2 02/03/2020 1504   GFRNONAA >60 05/03/2020 0112   GFRAA 84 02/03/2020 1504   Lipase     Component Value Date/Time   LIPASE 43 05/01/2020 0336       Studies/Results: DG Chest Port 1 View  Result Date: 05/01/2020 CLINICAL DATA:  Endotracheal tube placement. EXAM: PORTABLE CHEST 1 VIEW COMPARISON:  Same day. FINDINGS: Stable cardiomegaly. Endotracheal tube is directed into right mainstem bronchus; withdrawal by 3-4 cm is recommended. Nasogastric tube is seen entering stomach. No pneumothorax is noted. Mild bibasilar subsegmental atelectasis is noted with probable small pleural effusion. Bony thorax is unremarkable. IMPRESSION: Endotracheal tube is directed into right mainstem bronchus; withdrawal by 3-4 cm is recommended. These results will be called to the ordering clinician or representative by the Radiologist Assistant, and communication documented in the PACS or zVision Dashboard. Mild bibasilar subsegmental atelectasis is noted with probable small pleural effusion. Electronically Signed   By: Marijo Conception M.D.   On: 05/01/2020 15:49    Anti-infectives: Anti-infectives (From admission, onward)   Start     Dose/Rate Route Frequency Ordered Stop   05/01/20 1600  piperacillin-tazobactam (ZOSYN) IVPB 3.375 g        3.375 g 12.5 mL/hr over  240 Minutes Intravenous Every 8 hours 05/01/20 1508     05/01/20 1015  metroNIDAZOLE (FLAGYL) IVPB 500 mg        500 mg 100 mL/hr over 60 Minutes Intravenous  Once 05/01/20 1008 05/01/20 1212   05/01/20 0845  cefTRIAXone (ROCEPHIN) 1 g in sodium chloride 0.9 % 100 mL IVPB  Status:  Discontinued        1 g 200 mL/hr over 30 Minutes Intravenous Every 24 hours 05/01/20 0834 05/01/20 1508       Assessment/Plan 74 yo male with sigmoid colon perforation, POD2 s/p ex lap, sigmoid colectomy and end colostomy. - NPO, maintenance IV fluids, awaiting return  of bowel function - Multimodal pain control - begin toradol today, prn oxycodone and morphine - Myasthenia gravis: Continue IV solumedrol for now, transition to oral prednisone when tolerating PO. Appreciate neurology input. - FEN: lovenox, SCDs - Dispo: transfer to med-surg floor   LOS: 2 days    Michaelle Birks, MD [facility] General, Hepatobiliary and Pancreatic Surgery 05/03/20 1:34 PM

## 2020-05-03 NOTE — Progress Notes (Signed)
PT Cancellation Note  Patient Details Name: Danny Walker MRN: [id number] DOB: 06/03/1945   Cancelled Treatment:    Reason Eval/Treat Not Completed: Active bedrest order Will await for increase in activity orders prior to PT evaluation. Will follow.   [name] 05/03/2020, 7:02 AM Marisa Severin, PT, DPT Acute Rehabilitation Services Pager [phone] Office [phone]

## 2020-05-03 NOTE — Progress Notes (Signed)
Subjective: Denies diplopia, dysphagia, shortness of breath  Exam: Vitals:   05/03/20 1800 05/03/20 1900  BP: 125/76 121/84  Pulse: 86 84  Resp: 15 13  Temp:    SpO2: 97% 97%   Gen: In bed, NAD Resp: non-labored breathing, no acute distress Abd: soft, nt  Neuro: MS: Awake, alert, interactive and appropriate CN: Pupils equal and reactive, no significant ptosis Motor: 5/5 throughout to confrontation Sensory: Intact light touch  Impression: 74 year old male with myasthenia gravis on 40 mg of prednisone prior to admission with bowel obstruction, now on stress dose steroids.    Recommendations: 1) continue Mestinon IV 2) continue steroids per CCM, would be hesitant to markedly diminish the dose unless absolutely necessary from wound healing/infection standpoint  Roland Rack, MD [facility] [phone]  If 7pm- 7am, please page neurology on call as listed in [place].

## 2020-05-03 NOTE — Evaluation (Signed)
Physical Therapy Evaluation Patient Details Name: Danny Walker MRN: [id number] DOB: Apr 08, 1946 Today's Date: 05/03/2020   History of Present Illness  Patient is a 74 y/o male who presents with abdominal pain; found to have septic shock secondary to perforated diverticulitis, now s/p washout and Hartman's procedure 62/83, complicated by post op respiratory failure extubated 12/11. 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  Clinical Impression  Patient presents with generalized weakness, pain, decreased activity tolerance, impaired cardiorespiratory status and impaired mobility s/p above. 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 Today, pt requires Mod A of 2 for bed mobility, standing and Min A of 2 for short distance ambulation with use of RW for support. Distracted by pain. Reports hx of panic attacks. Sp02 dropped to 84% on RA once sitting EOB so donned 2L/min 02 [place] for rest of session. Education on log roll technique for safety/to decrease abdominal pain. Pt would like to return home- likely will progress once pain improves. Will follow acutely to maximize independence and mobility prior to return home.    Follow Up Recommendations Home health PT;Supervision for mobility/OOB (pending progress)    Equipment Recommendations  Rolling walker with 5" wheels    Recommendations for Other Services       Precautions / Restrictions Precautions Precautions: Fall;Other (comment) Precaution Comments: watch 02 Restrictions Weight Bearing Restrictions: No      Mobility  Bed Mobility Overal bed mobility: Needs Assistance Bed Mobility: Rolling;Sidelying to Sit Rolling: Mod assist;+2 for physical assistance Sidelying to sit: Mod assist;+2 for physical assistance;HOB elevated       General bed mobility comments: Cues for log roll technique,  to bend Right knee and reach for rail, assist with rolling and elevating trunk. Mild dizziness.    Transfers Overall transfer level: Needs assistance Equipment used: Rolling walker (2 wheeled) Transfers: Sit to/from Stand Sit to Stand: Mod assist;+2 physical assistance         General transfer comment: Assist of 2 to power to standing with cues for hand placement/technique. Pain limiting. Transferred to chair post ambulation.  Ambulation/Gait Ambulation/Gait assistance: Min assist;+2 safety/equipment Gait Distance (Feet): 7 Feet Assistive device: Rolling walker (2 wheeled) Gait Pattern/deviations: Shuffle;Step-to pattern;Step-through pattern;Decreased step length - right;Decreased step length - left Gait velocity: decreased Gait velocity interpretation: <1.31 ft/sec, indicative of household ambulator General Gait Details: Able to take a few steps to get to chair with cues for RW proximity/management, guarded due to pain. Sp02 dropped to mid 80s on RA, donned 2L/min 02 [place] for rest of session.  Stairs            Wheelchair Mobility    Modified Rankin (Stroke Patients Only)       Balance Overall balance assessment: History of Falls;Needs assistance Sitting-balance support: Feet supported;Bilateral upper extremity supported Sitting balance-Leahy Scale: Fair Sitting balance - Comments: Requires Ue support at this time due to pain in abdomen. Postural control: Posterior lean Standing balance support: During functional activity Standing balance-Leahy Scale: Poor Standing balance comment: Requires UE support in standing and close min guard-Min A                             Pertinent Vitals/Pain Pain Assessment: 0-10 Pain Score: 8  Pain Location: stomach Pain Descriptors / Indicators: Sore;Operative site guarding;Guarding;Grimacing Pain Intervention(s): Monitored during  session;Repositioned;Limited activity within patient's tolerance    Home Living  Family/patient expects to be discharged to:: Private residence Living Arrangements: Alone Available Help at Discharge: Family;Available PRN/intermittently Type of Home: House Home Access: Ramped entrance     Home Layout: One level Home Equipment: Shower seat      Prior Function Level of Independence: Independent         Comments: Has been having difficulty with LE strength recently so not working at this time 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 Drives. Reports 1 fall recently in shower.     Hand Dominance   Dominant Hand: Right    Extremity/Trunk Assessment   Upper Extremity Assessment Upper Extremity Assessment: Defer to OT evaluation    Lower Extremity Assessment Lower Extremity Assessment: Generalized weakness (but functional, Hx of MG)    Cervical / Trunk Assessment Cervical / Trunk Assessment: Normal  Communication   Communication:  (utilized Spanish interpreter Roman [phone])  Cognition Arousal/Alertness: Awake/alert Behavior During Therapy: WFL for tasks assessed/performed;Anxious Overall Cognitive Status: No family/caregiver present to determine baseline cognitive functioning                                 General Comments: Question awareness of deficits/safety as pt asking to go home despite needing assist of 2 for mobility and in lots of pain. Follows commands well. Distracted by pain. Reports hx of panic attacks and anxiety.      General Comments General comments (skin integrity, edema, etc.): Sp02 dropped to 84% on RA sitting EOB, donned 2L/min 02 [place] after deep breathing did not improve oxygenation. BP stable.    Exercises     Assessment/Plan    PT Assessment Patient needs continued PT services  PT Problem List Decreased strength;Decreased mobility;Decreased safety awareness;Decreased knowledge of precautions;Decreased activity tolerance;Decreased skin integrity;Cardiopulmonary status  limiting activity;Pain;Decreased balance;Decreased knowledge of use of DME       PT Treatment Interventions Therapeutic activities;Gait training;Therapeutic exercise;Patient/family education;Balance training;Functional mobility training;DME instruction    PT Goals (Current goals can be found in the Care Plan section)  Acute Rehab PT Goals Patient Stated Goal: to go home PT Goal Formulation: With patient Time For Goal Achievement: 05/17/20 Potential to Achieve Goals: Good    Frequency Min 3X/week   Barriers to discharge Decreased caregiver support lives alone    Co-evaluation               AM-PAC PT "6 Clicks" Mobility  Outcome Measure Help needed turning from your back to your side while in a flat bed without using bedrails?: A Lot Help needed moving from lying on your back to sitting on the side of a flat bed without using bedrails?: A Lot Help needed moving to and from a bed to a chair (including a wheelchair)?: A Lot Help needed standing up from a chair using your arms (e.g., wheelchair or bedside chair)?: A Lot Help needed to walk in hospital room?: A Little Help needed climbing 3-5 steps with a railing? : A Lot 6 Click Score: 13    End of Session Equipment Utilized During Treatment: Oxygen Activity Tolerance: Patient limited by pain;Patient tolerated treatment well Patient left: in chair;with call bell/phone within reach;with chair alarm set Nurse Communication: Mobility status PT Visit Diagnosis: Pain;Difficulty in walking, not elsewhere classified (R26.2);Unsteadiness on feet (R26.81) Pain - part of body:  (abdomen)    Time: 0102-7253  PT Time Calculation (min) (ACUTE ONLY): 27 min   Charges:   PT Evaluation $PT Eval Moderate Complexity: 1 Mod PT Treatments $Therapeutic Activity: 8-22 mins        Marisa Severin, PT, DPT Acute Rehabilitation Services Pager [phone] Office [phone]      Marguarite Arbour A Sabra Heck 05/03/2020, 11:52 AM

## 2020-05-03 NOTE — Progress Notes (Signed)
NAME:  Danny Walker, MRN:  [id number], DOB:  04-09-1946, LOS: 2 ADMISSION DATE:  05/01/2020, CONSULTATION DATE:  12/10  REFERRING MD:  Georgette Dover, CHIEF COMPLAINT:  Sepsis and critical care management    Brief History   33 yom w/ h/o MG s/p recent flare (currently working on pred taper). Admitted 12/10 w/ acute sigmoid diverticulitis w/ perforation and abd peritonitis. Went to OR for wash out, and hartmans. PCCM asked to assist w/ post op care.   History of present illness   74 year old male w/ hx as per below. Presented to the ER 12/10 w/ cc: 2 d h/o abd pain and constipation. Work up in [place] included: CT abd: showing acute sigmoid diverticulitis w perforation and free air. No leukocytosis. Temp 100.1. Went to OR for exploratory lap/colectomy and resection w/a bd washout. PCCM asked to eval 12/10 while in ICU to assist w/ critical care management.   Past Medical History  Myasthenia gravis, Chronic prednisone 40mg /d (recent MG flare so currently on downward taper) HTN, DDD back, Fatty liver disease, Cholecystectomy   Significant Hospital Events   12/10 admitted. Went to OR.   Consults:  pccm consulted 12/10  Procedures:  oett 12/10 > 12/11 Radial art line 12/10 >   Significant Diagnostic Tests:  12/10 CT abd/pelvis: 1. Acute sigmoid diverticulitis with a perforated diverticulum resulting in a large amount of gas primarily in the sigmoid mesocolon and retroperitoneum with some extension into the main mesentery. No free intraperitoneal air. No intraperitoneal fluid. 2. Mild pneumomediastinum tracking up from the retroperitoneum.3. Aortic atherosclerosis. 4. Enlarged prostate. Possible TURP defect.  Micro Data:   [facility] 12/10 >>ng Urin 12/10 >> Klebsiella  Antimicrobials:  Zosyn 12/10>>>  Interim history/subjective:   Extubated yesterday and has done well Complains of abdominal pain Afebrile Good urine output  Objective   Blood pressure 114/70, pulse 65, temperature 97.8 F  (36.6 C), temperature source Oral, resp. rate 12, height 5\' 7"  (1.702 m), weight 83 kg, SpO2 96 %.    FiO2 (%):  [32 %] 32 %   Intake/Output Summary (Last 24 hours) at 05/03/2020 0831 Last data filed at 05/03/2020 0800 Gross per 24 hour  Intake 3291.4 ml  Output 2050 ml  Net 1241.4 ml   Filed Weights   05/01/20 0327  Weight: 83 kg   Physical Exam: General: [name], resting in bed, in NAD. Neuro: Awake, interactive, nonfocal, strength 4+/5 all 4 extremities HEENT: [place]/AT. Sclerae anicteric.  Cardiovascular: RRR, no M/R/G.  Lungs: Bilateral clear breath sounds, no accessory muscle use Abdomen: Mildline incision in place, dressings C/D/I.  Colostomy in place.  Abdomen soft, NT/ND.  Musculoskeletal: No gross deformities, no edema.  Skin: Intact, warm, no rashes.   Chest x-ray 12/10 independently reviewed, ET tube in right mainstem, mild subsegmental atelectasis.  Labs show Mild leukocytosis, stable anemia, normal electrolytes  Assessment & Plan:   Perforated sigmoid colon - s/p OR for ex lap with sigmoidectomy and end colostomy. Klebsiella UTI - Post op care per CCS. - Continue empiric Zosyn.   Postop respiratory failure-extubated Incentive spirometer  Hx HTN. -Resume home Amlodipine, Lisinopril -mild hypotension likely related to intubation and propofol -Discontinue arterial line  Hx myasthenia gravis (recently seen by neuro who increased pred to 40mg  QD x 1 month then taper to 30mg  daily and 30mg  mestinon TID). - Neurology consulted by CCS. - Change steroids to Solu-Medrol 32 milligrams daily: 240 of prednisone, can transition to prednisone when able to take orally and  taper per neurology -Discussed with neurology, outpatient neurologist had planned on MRI of LS spine, this can be delayed until he is evaluated by PT to see if he still has lower extremity weakness  Hx liver mass. 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, doubt liver abscess   PCCM will be available as  needed  Best practice (evaluated daily)  Diet: NPO Pain/Anxiety/Delirium protocol (if indicated): DC sedation for extubation VAP protocol (if indicated): In place DVT prophylaxis: PAS GI prophylaxis: PPI Glucose control: NA Mobility: advance as tolerated Code Status: full code  Disposition: Can transfer to floor  Labs   CBC: Recent Labs  Lab 05/01/20 0336 05/01/20 1345 05/01/20 1421 05/01/20 1518 05/01/20 1556 05/02/20 0420 05/03/20 0112  WBC 7.9  --   --  8.1  --  10.8* 11.8*  HGB 11.3*   < > 9.9* 10.8* 10.5* 9.0* 8.7*  HCT 36.2*   < > 29.0* 32.7* 31.0* 27.8* 26.5*  MCV 94.3  --   --  92.1  --  94.2 92.7  PLT 122*  --   --  128*  --  PLATELET CLUMPS NOTED ON SMEAR, UNABLE TO ESTIMATE 114*   < > = values in this interval not displayed.    Basic Metabolic Panel: Recent Labs  Lab 05/01/20 0336 05/01/20 1345 05/01/20 1421 05/01/20 1518 05/01/20 1556 05/02/20 0420 05/03/20 0112  NA 139   < > 140 140 139 136 139  K 3.7   < > 3.7 3.8 4.0 4.7 3.8  CL 105  --   --  107  --  106 109  CO2 21*  --   --  20*  --  20* 21*  GLUCOSE 99  --   --  131*  --  270* 92  BUN 19  --   --  17  --  19 19  CREATININE 1.06  --   --  0.96  --  1.23 1.09  CALCIUM 8.6*  --   --  8.1*  --  7.4* 7.5*  MG  --   --   --   --   --  1.6* 2.4  PHOS  --   --   --   --   --  3.2 2.8   < > = values in this interval not displayed.   GFR: Estimated Creatinine Clearance: 62.2 mL/min (by C-G formula based on SCr of 1.09 mg/dL). Recent Labs  Lab 05/01/20 0336 05/01/20 0900 05/01/20 1105 05/01/20 1518 05/02/20 0420 05/03/20 0112  WBC 7.9  --   --  8.1 10.8* 11.8*  LATICACIDVEN  --  2.7* 3.0*  --   --   --     Liver Function Tests: Recent Labs  Lab 05/01/20 0336 05/02/20 0420  AST 29 79*  ALT 41 123*  ALKPHOS 65 60  BILITOT 0.9 0.7  PROT 5.4* 4.2*  ALBUMIN 2.9* 2.0*   Recent Labs  Lab 05/01/20 0336  LIPASE 43   No results for input(s): AMMONIA in the last 168 hours.  ABG     Component Value Date/Time   PHART 7.373 05/01/2020 1556   PCO2ART 40.7 05/01/2020 1556   PO2ART 77 (L) 05/01/2020 1556   HCO3 23.7 05/01/2020 1556   TCO2 25 05/01/2020 1556   ACIDBASEDEF 1.0 05/01/2020 1556   O2SAT 95.0 05/01/2020 Harrah MD. FCCP. [facility] See Amion for pager  If no response to pager , please call [phone]  After 7:00 pm call Elink  [phone]   05/03/2020, 8:31 AM

## 2020-05-04 ENCOUNTER — Encounter: Payer: Medicare Other | Admitting: Internal Medicine

## 2020-05-04 ENCOUNTER — Encounter (HOSPITAL_COMMUNITY): Payer: Self-pay | Admitting: Surgery

## 2020-05-04 LAB — GLUCOSE, CAPILLARY
Glucose-Capillary: 102 mg/dL — ABNORMAL HIGH (ref 70–99)
Glucose-Capillary: 116 mg/dL — ABNORMAL HIGH (ref 70–99)
Glucose-Capillary: 117 mg/dL — ABNORMAL HIGH (ref 70–99)
Glucose-Capillary: 118 mg/dL — ABNORMAL HIGH (ref 70–99)
Glucose-Capillary: 121 mg/dL — ABNORMAL HIGH (ref 70–99)
Glucose-Capillary: 127 mg/dL — ABNORMAL HIGH (ref 70–99)

## 2020-05-04 LAB — SURGICAL PATHOLOGY

## 2020-05-04 MED ORDER — PANTOPRAZOLE SODIUM 40 MG PO TBEC
40.0000 mg | DELAYED_RELEASE_TABLET | Freq: Every day | ORAL | Status: DC
  Administered 2020-05-04 – 2020-05-08 (×5): 40 mg via ORAL
  Filled 2020-05-04 (×5): qty 1

## 2020-05-04 MED ORDER — POLYVINYL ALCOHOL 1.4 % OP SOLN
1.0000 [drp] | OPHTHALMIC | Status: DC | PRN
  Filled 2020-05-04: qty 15

## 2020-05-04 MED ORDER — OXYBUTYNIN CHLORIDE 5 MG PO TABS
5.0000 mg | ORAL_TABLET | Freq: Every day | ORAL | Status: DC
  Administered 2020-05-05 – 2020-05-08 (×4): 5 mg via ORAL
  Filled 2020-05-04 (×5): qty 1

## 2020-05-04 MED ORDER — ACETAMINOPHEN 325 MG PO TABS: 650.0000 mg | ORAL_TABLET | Freq: Four times a day (QID) | ORAL | Status: DC | PRN

## 2020-05-04 MED ORDER — LORAZEPAM 2 MG/ML IJ SOLN
1.0000 mg | Freq: Once | INTRAMUSCULAR | Status: AC | PRN
  Administered 2020-05-04: 1 mg via INTRAVENOUS
  Filled 2020-05-04: qty 1

## 2020-05-04 MED ORDER — AMLODIPINE BESYLATE 5 MG PO TABS
5.0000 mg | ORAL_TABLET | Freq: Every day | ORAL | Status: DC
  Administered 2020-05-04 – 2020-05-08 (×5): 5 mg via ORAL
  Filled 2020-05-04 (×5): qty 1

## 2020-05-04 MED ORDER — FINASTERIDE 5 MG PO TABS
5.0000 mg | ORAL_TABLET | Freq: Every day | ORAL | Status: DC
  Administered 2020-05-04 – 2020-05-08 (×5): 5 mg via ORAL
  Filled 2020-05-04 (×5): qty 1

## 2020-05-04 MED ORDER — LISINOPRIL 20 MG PO TABS
40.0000 mg | ORAL_TABLET | Freq: Every day | ORAL | Status: DC
  Administered 2020-05-04 – 2020-05-08 (×5): 40 mg via ORAL
  Filled 2020-05-04 (×5): qty 2

## 2020-05-04 NOTE — Progress Notes (Signed)
ID: 74 y.o. 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 Presenting symptoms (2019) difficulty swallowing, facial weakness, double vision and 30 lb weight loss. Current home regimen prednisone 50 mg/daily and mestinon 30 mg TID (04/20/20 last office visit, plan to taper to 40 mg daily x 1 month then 30 mg/daily). Presented 05/01/2020 w/ acute bowel peforation, neurology following to monitor for potential to develop MG exacerbation in this setting.    Subjective: Spanish interpretor used by phone service. Denies diplopia, dysphagia, shortness of breath. He says he has back pain and Dr. Posey Pronto was going to get a MRI but he is claustrophobic and needs sedation, but he does not have back pain today and it is difficult to illicit the details. He tells me he fell at home one day because his legs were weak and he put warm compresses on which helped. But when NP asked where he put the compresses he says on his eyes, so the story is very confusing.  He says he takes his meds for MG at home as required. He says he was not having any concerning sx of MG exacerbation when he presented at this visit, only the abdominal pain.   Exam: Vitals:   05/04/20 0100 05/04/20 0336  BP: (!) 133/91 112/76  Pulse: 93 60  Resp: 18 16  Temp: 98.8 F (37.1 C) 98 F (36.7 C)  SpO2: 98% 99%   Gen: Appears well, sitting in recliner. NAD HEENT: atraumatic.  Card: RRR.  Resp: non-labored breathing, no acute distress Abd: surgery examined.  Extremities: moves all extremities spontaneously. No edema.  Psych: Appropriate to situation.    NEURO:  Mental Status: AA&Ox3. Knows why he is in the hospital and able to relate the surgery he had.  Speech/Language: Naming, repetition, fluency, and comprehension intact. No dysarthria.   Cranial Nerves:   II: PERRL. Visual fields full. No cut. Acuity intact.  III, IV, VI: EOMI. Eyelids elevate symmetrically. Lid strength 5/5.  V: Sensation is intact to light touch and symmetrical to face. Able to move jaw back and forth. VII: Smile is symmetrical. Able to puff cheeks and raise eyebrows.  VIII: hearing intact to voice. IX, X: Palate elevates symmetrically. Phonation is normal.  XI: Shoulder shrug 5/5. XII: tongue is midline without fasciculations. Motor: 5/5 strength to all muscle groups tested.  Tone: is normal and bulk is normal Sensation- Intact to light touch bilaterally. Extinction intact to touch.  Coordination: FTN intact bilaterally. No drift.  DTRs: 2+ throughout except 1+ RLE.  Gait- deferred  SLURP test: Able to take in water with a straw, but NP does not think he understands to do it with one long slurp. No coughing/choking noted.   No ptosis with batting eyes x 20 secs. No double vision with upwards gaze. Right arm fatigue with exercising arms x 20 seconds, but strength remains same to RUE afterwards.    Impression: 74 year old male with myasthenia gravis on 40 mg of prednisone prior to admission with bowel obstruction, now on stress dose steroids.    Recommendations: 1) continue Mestinon IV. If tolerates po, can change to po home dose tomorrow, 30mg  po TID. 2) continue steroids per CCM, would be hesitant to markedly diminish the dose unless absolutely necessary from wound healing/infection standpoint. NP spoke with surgery PA. They are advancing to clear liquids po today. Will leave on  IV regimen of Solumedrol and Mestinon x 1 more day to ensure he can tolerate po. When switching back to po meds, he will need his home dose of steroids and Mestinon restarted and continued on discharge.  3) Will need outpt f/up with Dr Posey Pronto in 2 weeks after discharge.  4) Dr. Leonel Ramsay ordered spinal MRIs to be performed here and Ativan has been ordered for sedation due to pt's  claustrophobia. These were ordered for LE weakness to ensure no spinal pathology masquerading.   Pt seen by Clance Boll, NP and plan discussed and agreed upon by Dr. Curly Shores.  Pager [phone]

## 2020-05-04 NOTE — Progress Notes (Addendum)
12/12 2341 RN communicated with Pt through an interpreter agent Mickel Baas # [phone]. Pt stated he had no pain. Pt observed to be comfortable. Pt is stable. Pt stated edema of the eye is going down, eye drops helping. RN will continue to assess pain.  12/13 1388 RN communicated with Pt through an interpreter agent Shanon Brow #[phone]. Pt stated no pain. Pt is observed to be comfortable.RN will continue to assess pain.  12/12 0605 RN communicated with PT through interpreter agent [name]. Pt stated he doesn't feel pain yet. Pt observed to be comfortable.

## 2020-05-04 NOTE — Plan of Care (Signed)
  Problem: Activity: Goal: Risk for activity intolerance will decrease Outcome: Progressing   

## 2020-05-04 NOTE — Consult Note (Addendum)
[place] Nurse ostomy follow up Patient receiving care in [facility] 5N25.  Teleinterpreter operator [phone] used throughout my interaction with patient. Stoma type/location: LUQ colostomy Stomal assessment/size: deferred; supplies have to be ordered Peristomal assessment: deferred Treatment options for stomal/peristomal skin: barrier ring; possibly a convex pouch as well Output: formed green  Ostomy pouching: 2pc. Flat in place. One piece Kellie Simmering [phone] ordered along with barrier rings Kellie Simmering [phone]. Education provided: How to open, close, empty, clean tail.  Patient able to open/close; asked many appropriate questions.  [place] English and Landscape architect provided today; Spanish version for the patient, English version for his daughter. Enrolled patient in [place] Start Discharge program: No Val Riles, RN, MSN, [facility], CNS-BC, pager [phone]

## 2020-05-04 NOTE — Progress Notes (Signed)
Physical Therapy Treatment Patient Details Name: Danny Walker MRN: [id number] DOB: 18-Apr-1946 Today's Date: 05/04/2020    History of Present Illness Patient is a 74 y/o male who presents with abdominal pain; found to have septic shock secondary to perforated diverticulitis, now s/p washout and Hartman's procedure 61/60, complicated by post op respiratory failure extubated 12/11. 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    PT Comments    Pt complaining of significant abdominal pain this afternoon, especially with mobility. Pt requiring min-mod assist for all mobility tasks at this time. Pt is progressing with mobility tolerance, ambulating room distance limited by abdominal pain and dizziness. Pt will benefit from post-acute rehab given mobility deficits post-operatively, consult placed. PT to continue to follow acutely.   SpO2 90% and greater on RA during mobility, 2LO2 reapplied after session to maintain SPO2   Follow Up Recommendations  Supervision for mobility/OOB;CIR     Equipment Recommendations  Rolling walker with 5" wheels    Recommendations for Other Services       Precautions / Restrictions Precautions Precautions: Fall;Other (comment) Precaution Comments: watch 02 Restrictions Weight Bearing Restrictions: No    Mobility  Bed Mobility Overal bed mobility: Needs Assistance Bed Mobility: Rolling;Sidelying to Sit;Sit to Sidelying Rolling: Mod assist Sidelying to sit: Mod assist     Sit to sidelying: Mod assist General bed mobility comments: Mod assist for log roll technique in and out of bed for trunk and LE management, especially elevation of trunk off bed.  Transfers Overall transfer level: Needs assistance Equipment used: Rolling walker (2 wheeled) Transfers: Sit to/from Stand Sit to Stand: Min assist;From elevated surface         General transfer comment: Min assist for power up, steadying. slow to  rise and steady.  Ambulation/Gait Ambulation/Gait assistance: Min assist Gait Distance (Feet): 20 Feet Assistive device: Rolling walker (2 wheeled) Gait Pattern/deviations: Shuffle;Step-through pattern;Decreased stride length;Trunk flexed Gait velocity: decr   General Gait Details: min assist to steady, navigate RW in room. SpO2 90% and greater on RA during gait, pt reporting dizziness after 15 ft ambulation requiring return to supine.   Stairs             Wheelchair Mobility    Modified Rankin (Stroke Patients Only)       Balance Overall balance assessment: History of Falls;Needs assistance Sitting-balance support: Feet supported;Bilateral upper extremity supported Sitting balance-Leahy Scale: Fair Sitting balance - Comments: initially requiring min assist for posterior truncal assist, correcting to supervision for safety with prolonged sitting Postural control: Posterior lean Standing balance support: During functional activity;Bilateral upper extremity supported Standing balance-Leahy Scale: Poor Standing balance comment: reliant on UE support in standing                            Cognition Arousal/Alertness: Awake/alert Behavior During Therapy: WFL for tasks assessed/performed;Anxious Overall Cognitive Status: No family/caregiver present to determine baseline cognitive functioning                                 General Comments: Pt lacks insight into how present deficits will impact ability to mobilize and care for self at home. Pt also initially irritated when PT arrived to help pt mobilize, states "I have already been out of bed two times today!".      Exercises General Exercises - Lower  Extremity Heel Slides: AROM;Both;10 reps;Supine Hip ABduction/ADduction: AROM;Both;10 reps;Supine Straight Leg Raises: Both;10 reps;Supine;AAROM    General Comments        Pertinent Vitals/Pain Pain Assessment: Faces Faces Pain Scale: Hurts  little more Pain Location: stomach Pain Descriptors / Indicators: Sore;Operative site guarding;Guarding;Grimacing Pain Intervention(s): Limited activity within patient's tolerance;Monitored during session;Repositioned    Home Living                      Prior Function            PT Goals (current goals can now be found in the care plan section) Acute Rehab PT Goals Patient Stated Goal: to go home PT Goal Formulation: With patient Time For Goal Achievement: 05/17/20 Potential to Achieve Goals: Good Progress towards PT goals: Progressing toward goals    Frequency    Min 3X/week      PT Plan Discharge plan needs to be updated    Co-evaluation              AM-PAC PT "6 Clicks" Mobility   Outcome Measure  Help needed turning from your back to your side while in a flat bed without using bedrails?: A Lot Help needed moving from lying on your back to sitting on the side of a flat bed without using bedrails?: A Lot Help needed moving to and from a bed to a chair (including a wheelchair)?: A Lot Help needed standing up from a chair using your arms (e.g., wheelchair or bedside chair)?: A Little Help needed to walk in hospital room?: A Little Help needed climbing 3-5 steps with a railing? : A Lot 6 Click Score: 14    End of Session   Activity Tolerance: Patient limited by pain;Patient limited by fatigue Patient left: in chair;with call bell/phone within reach;in bed;with bed alarm set;with nursing/sitter in room Nurse Communication: Mobility status PT Visit Diagnosis: Pain;Difficulty in walking, not elsewhere classified (R26.2);Unsteadiness on feet (R26.81) Pain - part of body:  (abdomen)     Time: 4734-0370 PT Time Calculation (min) (ACUTE ONLY): 31 min  Charges:  $Therapeutic Exercise: 8-22 mins $Therapeutic Activity: 8-22 mins                     Stacie Glaze, PT Acute Rehabilitation Services Pager [phone]  Office [phone]    Louis Matte 05/04/2020, 4:47 PM

## 2020-05-04 NOTE — Progress Notes (Signed)
Patient performed a NIF of -40 and a VC of 1L.

## 2020-05-04 NOTE — Progress Notes (Signed)
Progress Note  3 Days Post-Op  Subjective: Spanish interpreter used.  Patient reports abdominal pain is minimal at rest but he does have some increased pain with movement. He has not been walking in the halls or anything yet and feels weak but very willing to start mobilizing more as able. He denies nausea. He was unsure if he has had any stoma output yet. He does not usually wear oxygen at home. He had some questions regarding MRIs of spine that appear to have been ordered by neuro.   Objective: Vital signs in last 24 hours: Temp:  [98 F (36.7 C)-98.8 F (37.1 C)] 98.4 F (36.9 C) (12/13 0814) Pulse Rate:  [60-103] 62 (12/13 0814) Resp:  [13-23] 17 (12/13 0814) BP: (112-153)/(66-91) 112/74 (12/13 0814) SpO2:  [89 %-100 %] 98 % (12/13 0814)    Intake/Output from previous day: 12/12 0701 - 12/13 0700 In: 1540 [I.V.:1401.8; IV Piggyback:138.2] Out: 1150 [Urine:1150] Intake/Output this shift: No intake/output data recorded.  PE: General: pleasant, WD, overweight male who is sitting up in the chair Heart: regular, rate, and rhythm.   Lungs:  Respiratory effort nonlabored Abd: soft, appropriately ttp, moderately distended, midline incision with honeycomb dressing present, stoma viable with SS fluid and several hard stool pellets in ostomy appliance    Lab Results:  Recent Labs    05/02/20 0420 05/03/20 0112  WBC 10.8* 11.8*  HGB 9.0* 8.7*  HCT 27.8* 26.5*  PLT PLATELET CLUMPS NOTED ON SMEAR, UNABLE TO ESTIMATE 114*   BMET Recent Labs    05/02/20 0420 05/03/20 0112  NA 136 139  K 4.7 3.8  CL 106 109  CO2 20* 21*  GLUCOSE 270* 92  BUN 19 19  CREATININE 1.23 1.09  CALCIUM 7.4* 7.5*   PT/INR No results for input(s): LABPROT, INR in the last 72 hours. CMP     Component Value Date/Time   NA 139 05/03/2020 0112   NA 142 02/03/2020 1504   K 3.8 05/03/2020 0112   CL 109 05/03/2020 0112   CO2 21 (L) 05/03/2020 0112   GLUCOSE 92 05/03/2020 0112   BUN 19  05/03/2020 0112   BUN 31 (H) 02/03/2020 1504   CREATININE 1.09 05/03/2020 0112   CREATININE 0.77 01/01/2013 1045   CALCIUM 7.5 (L) 05/03/2020 0112   PROT 4.2 (L) 05/02/2020 0420   PROT 7.5 02/03/2020 1504   ALBUMIN 2.0 (L) 05/02/2020 0420   ALBUMIN 3.7 02/03/2020 1504   AST 79 (H) 05/02/2020 0420   ALT 123 (H) 05/02/2020 0420   ALKPHOS 60 05/02/2020 0420   BILITOT 0.7 05/02/2020 0420   BILITOT 0.2 02/03/2020 1504   GFRNONAA >60 05/03/2020 0112   GFRAA 84 02/03/2020 1504   Lipase     Component Value Date/Time   LIPASE 43 05/01/2020 0336       Studies/Results: No results found.  Anti-infectives: Anti-infectives (From admission, onward)   Start     Dose/Rate Route Frequency Ordered Stop   05/01/20 1600  piperacillin-tazobactam (ZOSYN) IVPB 3.375 g        3.375 g 12.5 mL/hr over 240 Minutes Intravenous Every 8 hours 05/01/20 1508     05/01/20 1015  metroNIDAZOLE (FLAGYL) IVPB 500 mg        500 mg 100 mL/hr over 60 Minutes Intravenous  Once 05/01/20 1008 05/01/20 1212   05/01/20 0845  cefTRIAXone (ROCEPHIN) 1 g in sodium chloride 0.9 % 100 mL IVPB  Status:  Discontinued        1  g 200 mL/hr over 30 Minutes Intravenous Every 24 hours 05/01/20 0834 05/01/20 1508       Assessment/Plan Myasthenia gravis - on 40 mg of prednisone (20 breakfast and 20 at lunch) - currently on IV solumedrol 32 mg daily and IV pyridostigmine 1 mg TID, neuro following and planning to evaluate for bulbar symptoms with swallowing today HTN - restart home meds  Septic shock secondary to perforated diverticulitis S/P Ex-lap, 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 Georgette Dover - POD#3 - having some stool output from stoma, start CLD today - mobilize - surgical path pending - continue IV abx - WOC to see for teaching on colostomy care  FEN: CLD, decrease IVF to 38 cc/h VTE: lovenox ID: Rocephin/Flagyl 12/10; IV Zosyn 12/10>>  Dispo: Advance to CLD today. WOC to see. PT/OT. Neurology  following for myasthenia. Repeat labs tomorrow AM.   LOS: 3 days    Norm Parcel , [facility] 05/04/2020, 9:56 AM Please see Amion for pager number during day hours 7:00am-4:30pm

## 2020-05-04 NOTE — Plan of Care (Signed)

## 2020-05-04 NOTE — Progress Notes (Signed)
Pt brought to MRI for double study but refused. He said he is extremely claustrophobic and needs to be fully sedated. He stated that if he proceeds, instead of getting better in health he will get worse because he will panic during the exam. He is also wary about having the MRI because one of the doctors said it was not recommended for him to have an MRI so soon after post-surgery and would like to talk to them first. I asked the name of said doctor but he did not know since he has seen so many. Advised pt to talk to nurse and doctor about sedation and his other concerns.

## 2020-05-04 NOTE — Evaluation (Signed)
Occupational Therapy Evaluation Patient Details Name: Danny Walker MRN: [id number] DOB: [birth year] Today's Date: 05/04/2020    History of Present Illness Patient is a 74 y/o male who presents with abdominal pain; found to have septic shock secondary to perforated diverticulitis, now s/p washout and Hartman's procedure 32/20, complicated by post op respiratory failure extubated 12/11. 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   Clinical Impression   PTA, pt lives alone and reports Independence with ADLs, IADLs and mobility without use of AD. Pt endorses at least one recent fall at home. Pt presents now with deficits in cardiopulmonary tolerance, strength, standing balance and pain. Pt overall Mod A for bed mobility, Min A for sit to stand transfers and demo fatigue/desats to 85% on RA after taking steps to recliner using RW. Educated in energy conservation with reinforcement needed, > 3 min to recover to 90% with seated rest break and pursed lip breathing. Pt overall Min A for UB ADLs and Max A for LB ADLs. Recommend postacute rehab to maximize safety and decrease fall risk during ADLs/mobility at home.    Follow Up Recommendations  CIR;Other (comment);Supervision/Assistance - 24 hour (will need SNF if not qualified for CIR)    Equipment Recommendations  3 in 1 bedside commode;Other (comment) (RW)    Recommendations for Other Services Rehab consult     Precautions / Restrictions Precautions Precautions: Fall;Other (comment) Precaution Comments: watch 02 Restrictions Weight Bearing Restrictions: No      Mobility Bed Mobility Overal bed mobility: Needs Assistance Bed Mobility: Supine to Sit     Supine to sit: Mod assist;HOB elevated     General bed mobility comments: Mod A for trunk advancement to EOB. Pt able to advance B LE to bedside with light assist    Transfers Overall transfer level: Needs assistance Equipment  used: Rolling walker (2 wheeled) Transfers: Sit to/from Stand Sit to Stand: Min assist         General transfer comment: Min A for sit to stand with RW, cues for hand placement and safety with DME use as pt tends to pull on RW    Balance Overall balance assessment: History of Falls;Needs assistance Sitting-balance support: Feet supported;Bilateral upper extremity supported Sitting balance-Leahy Scale: Fair     Standing balance support: During functional activity;Bilateral upper extremity supported Standing balance-Leahy Scale: Poor Standing balance comment: reliant on UE support in standing                           ADL either performed or assessed with clinical judgement   ADL Overall ADL's : Needs assistance/impaired Eating/Feeding: NPO   Grooming: Set up;Sitting;Wash/dry hands Grooming Details (indicate cue type and reason): Setup to wash hands seated in recliner Upper Body Bathing: Minimal assistance;Sitting   Lower Body Bathing: Moderate assistance;Sit to/from stand   Upper Body Dressing : Set up;Sitting   Lower Body Dressing: Moderate assistance;Sitting/lateral leans;Sit to/from stand Lower Body Dressing Details (indicate cue type and reason): Unable to reach feet sitting EOB though pt reports this is how he typically gets dressed at home Toilet Transfer: Minimal assistance;Stand-pivot;BSC;RW Toilet Transfer Details (indicate cue type and reason): simulated with short steps to recliner Toileting- Clothing Manipulation and Hygiene: Moderate assistance;Sit to/from stand       Functional mobility during ADLs: Minimal assistance;Rolling walker;Cueing for sequencing;Cueing for safety General ADL Comments: Pt with deficits in cardiopulmonary tolerance, strength and standing balance impacting  ability to complete daily tasks     Vision Patient Visual Report: No change from baseline Vision Assessment?: No apparent visual deficits     Perception     Praxis       Pertinent Vitals/Pain Pain Assessment: Faces Faces Pain Scale: Hurts little more Pain Location: stomach Pain Descriptors / Indicators: Sore;Operative site guarding;Guarding;Grimacing Pain Intervention(s): Monitored during session;Repositioned     Hand Dominance Right   Extremity/Trunk Assessment Upper Extremity Assessment Upper Extremity Assessment: Generalized weakness   Lower Extremity Assessment Lower Extremity Assessment: Defer to PT evaluation   Cervical / Trunk Assessment Cervical / Trunk Assessment: Normal   Communication Communication Communication: Prefers language other than English   Cognition Arousal/Alertness: Awake/alert Behavior During Therapy: WFL for tasks assessed/performed;Anxious Overall Cognitive Status: No family/caregiver present to determine baseline cognitive functioning                                 General Comments: Pt with difficulty understanding medical conditions, procedures and safety at home when OT inquired about rehab. Requires increased time for explanations, frequent cueing for safety/sequencing   General Comments  Pt on 2.5 L O2 98-100%. Trialed on RA with desats to 85% during steps to chair. >3 min to increase to 89-90% on RA with seated rest break and cues for pursed lip breathing. Nataly interpreter utilized. Pt with frequent questions about MRI, anesthesia, etc - deferred questions to RN and MD though pt called step daughter during session asking OT to speak to her and explain things.    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 Drives. Reports 1 fall recently in shower.        OT Problem List: Decreased strength;Decreased activity tolerance;Impaired balance (sitting and/or standing);Decreased cognition;Decreased knowledge of use of DME or AE;Cardiopulmonary status limiting activity;Pain      OT Treatment/Interventions: Self-care/ADL training;Therapeutic exercise;Energy conservation;DME and/or AE instruction;Therapeutic activities;Patient/family education;Balance training    OT Goals(Current goals can be found in the care plan section) Acute Rehab OT Goals Patient Stated Goal: go home, have MRI OT Goal Formulation: With patient Time For Goal Achievement: 05/18/20 Potential to Achieve Goals: Good ADL Goals Pt Will Perform Grooming: with modified independence;standing Pt Will Perform Lower Body Bathing: with modified independence;sit to/from stand;sitting/lateral leans Pt Will Perform Lower Body Dressing: with modified independence;sit to/from stand;sitting/lateral leans Pt Will Transfer to Toilet: with supervision;ambulating Pt Will Perform Toileting - Clothing Manipulation and hygiene: with modified independence;sit to/from stand;sitting/lateral leans Pt/caregiver will Perform Home Exercise Program: Increased strength;Both right and left upper extremity;With theraband;Independently;With written HEP provided Additional ADL Goal #1: Pt to implement 2 energy conservation strategies during ADLs/mobility to maintain SpO2 >88%  OT Frequency: Min 2X/week   Barriers to D/C:  Co-evaluation              AM-PAC OT "6 Clicks" Daily Activity     Outcome Measure Help from another person eating meals?: Total (NPO) Help from another  person taking care of personal grooming?: A Little Help from another person toileting, which includes using toliet, bedpan, or urinal?: A Lot Help from another person bathing (including washing, rinsing, drying)?: A Lot Help from another person to put on and taking off regular upper body clothing?: A Little Help from another person to put on and taking off regular lower body clothing?: A Lot 6 Click Score: 13   End of Session Equipment Utilized During Treatment: Gait belt;Rolling walker;Oxygen Nurse Communication: Mobility status;Other (comment) (no phone, O2)  Activity Tolerance: Patient tolerated treatment well Patient left: in chair;with call bell/phone within reach;with chair alarm set  OT Visit Diagnosis: Unsteadiness on feet (R26.81);Other abnormalities of gait and mobility (R26.89);Muscle weakness (generalized) (M62.81);Other (comment);Pain (decreased cardiopulmonary tolerance)                Time: 4967-5916 OT Time Calculation (min): 56 min Charges:  OT General Charges $OT Visit: 1 Visit OT Evaluation $OT Eval Moderate Complexity: 1 Mod OT Treatments $Self Care/Home Management : 23-37 mins $Therapeutic Activity: 8-22 mins  Layla Maw, OTR/L  Layla Maw 05/04/2020, 9:32 AM

## 2020-05-04 NOTE — Progress Notes (Signed)
RT NOTE:  Patient performed  NIF -38 VC 1L

## 2020-05-05 ENCOUNTER — Inpatient Hospital Stay (HOSPITAL_COMMUNITY): Payer: Medicare Other

## 2020-05-05 LAB — GLUCOSE, CAPILLARY
Glucose-Capillary: 107 mg/dL — ABNORMAL HIGH (ref 70–99)
Glucose-Capillary: 124 mg/dL — ABNORMAL HIGH (ref 70–99)
Glucose-Capillary: 139 mg/dL — ABNORMAL HIGH (ref 70–99)
Glucose-Capillary: 84 mg/dL (ref 70–99)
Glucose-Capillary: 90 mg/dL (ref 70–99)
Glucose-Capillary: 97 mg/dL (ref 70–99)

## 2020-05-05 LAB — CBC
HCT: 24.4 % — ABNORMAL LOW (ref 39.0–52.0)
Hemoglobin: 7.6 g/dL — ABNORMAL LOW (ref 13.0–17.0)
MCH: 29.5 pg (ref 26.0–34.0)
MCHC: 31.1 g/dL (ref 30.0–36.0)
MCV: 94.6 fL (ref 80.0–100.0)
Platelets: 140 10*3/uL — ABNORMAL LOW (ref 150–400)
RBC: 2.58 MIL/uL — ABNORMAL LOW (ref 4.22–5.81)
RDW: 17.3 % — ABNORMAL HIGH (ref 11.5–15.5)
WBC: 6.8 10*3/uL (ref 4.0–10.5)
nRBC: 0.3 % — ABNORMAL HIGH (ref 0.0–0.2)

## 2020-05-05 LAB — BASIC METABOLIC PANEL
Anion gap: 8 (ref 5–15)
BUN: 24 mg/dL — ABNORMAL HIGH (ref 8–23)
CO2: 24 mmol/L (ref 22–32)
Calcium: 7.6 mg/dL — ABNORMAL LOW (ref 8.9–10.3)
Chloride: 108 mmol/L (ref 98–111)
Creatinine, Ser: 1.04 mg/dL (ref 0.61–1.24)
GFR, Estimated: >60 mL/min (ref >60)
Glucose, Bld: 105 mg/dL — ABNORMAL HIGH (ref 70–99)
Potassium: 3.5 mmol/L (ref 3.5–5.1)
Sodium: 140 mmol/L (ref 135–145)

## 2020-05-05 MED ORDER — LORAZEPAM 2 MG/ML IJ SOLN
1.0000 mg | Freq: Once | INTRAMUSCULAR | Status: AC
  Administered 2020-05-05: 1 mg via INTRAVENOUS
  Filled 2020-05-05: qty 1

## 2020-05-05 MED ORDER — PYRIDOSTIGMINE BROMIDE 60 MG PO TABS
30.0000 mg | ORAL_TABLET | Freq: Three times a day (TID) | ORAL | Status: DC
  Administered 2020-05-05 – 2020-05-08 (×9): 30 mg via ORAL
  Filled 2020-05-05 (×14): qty 0.5

## 2020-05-05 MED ORDER — PREDNISONE 20 MG PO TABS
20.0000 mg | ORAL_TABLET | Freq: Two times a day (BID) | ORAL | Status: DC
  Administered 2020-05-05 – 2020-05-08 (×7): 20 mg via ORAL
  Filled 2020-05-05 (×7): qty 1

## 2020-05-05 NOTE — Progress Notes (Signed)
NIF-40 VC-1.2L  With great effort

## 2020-05-05 NOTE — Progress Notes (Signed)
Inpatient Rehab Admissions Coordinator Note:   Per therapy recommendations, pt was screened for CIR candidacy by Shann Medal, PT, DPT.  At this time we are recommending a CIR consult.  If pt would like to be considered for CIR program, please place an IP Rehab MD consult order.    Please contact me with questions.   Shann Medal, PT, DPT [phone] 05/05/20 11:52 AM

## 2020-05-05 NOTE — Progress Notes (Signed)
Administered medication and educated patient via interpretor [phone]. Patient not reporting any pain at this time. Patient has no questions at this time.

## 2020-05-05 NOTE — Plan of Care (Signed)
  Problem: Health Behavior/Discharge Planning: Goal: Ability to manage health-related needs will improve Outcome: Progressing   Problem: Clinical Measurements: Goal: Diagnostic test results will improve Outcome: Progressing Goal: Respiratory complications will improve Outcome: Progressing   Problem: Activity: Goal: Risk for activity intolerance will decrease Outcome: Progressing

## 2020-05-05 NOTE — Progress Notes (Signed)
NIF -40  Good patient effort VC 1.1L Good effort with coaching.

## 2020-05-05 NOTE — Progress Notes (Signed)
Patient laying comfortably in bed. Administered medications and educated patient on how to empty colostomy. Patient reporting no pain. Used interpretor [phone]

## 2020-05-05 NOTE — Progress Notes (Signed)
Neurology Progress Note  S: Spanish interpretor on [employer] used during pt visit.    I am doing well. No double vision. No dysphagia with liquids overnight. No diplopia. Wants to know when he can go home.   Pt states that his back pain and leg weakness is chronic and he was receiving PT at home prior to admission.   O: Current vital signs: BP 138/78 (BP Location: Left Arm)   Pulse 70   Temp 98 F (36.7 C) (Oral)   Resp 15   Ht '5\' 7"'  (1.702 m)   Wt 83 kg   SpO2 98%   BMI 28.66 kg/m  Vital signs in last 24 hours: Temp:  [98 F (36.7 C)-98.3 F (36.8 C)] 98 F (36.7 C) (12/14 1335) Pulse Rate:  [59-75] 70 (12/14 1335) Resp:  [15-16] 15 (12/14 1335) BP: (122-138)/(73-94) 138/78 (12/14 1335) SpO2:  [97 %-100 %] 98 % (12/14 1335)  GENERAL: Awake, alert in NAD HEENT: Normocephalic and atraumatic, dry mm LUNGS: Normal respiratory effort.  CV: RRR on tele.  ABDOMEN: Soft, nontender Ext: warm  NEURO:  Mental Status: AA&Ox3  Speech/Language: speech is without dysarthria.  Naming, repetition, fluency, and comprehension intact.  Cranial Nerves:  II: PERRL. Visual fields full.  III, IV, VI: EOMI. Eyelids elevate symmetrically.  V: Sensation is intact to light touch to face.  VII: Smile is symmetrical. Able to puff cheeks and raise eyebrows.  VIII: hearing intact to voice. IX, X: Palate elevates symmetrically. Phonation is normal.  XI: Shoulder shrug 5/5. XII: tongue is midline without fasciculations. Motor: 5/5 strength to all muscle groups tested.  Tone: is normal and bulk is normal Sensation- Intact to light touch bilaterally. Extinction intact.    Coordination: FTN intact bilaterally. No UE drift.  DTRs: 2+ throughout Gait- deferred  Medications  Current Facility-Administered Medications:  .  0.9 %  sodium chloride infusion, , Intravenous, PRN, Rigoberto Noel, MD, Last Rate: 10 mL/hr at 05/03/20 1900, Infusion Verify at 05/03/20 1900 .  acetaminophen (TYLENOL)  tablet 650 mg, 650 mg, Oral, Q6H PRN, Norm Parcel, PA-C .  amLODipine (NORVASC) tablet 5 mg, 5 mg, Oral, Daily, Norm Parcel, PA-C, 5 mg at 05/05/20 0916 .  chlorhexidine gluconate (MEDLINE KIT) (PERIDEX) 0.12 % solution 15 mL, 15 mL, Mouth Rinse, BID, Rigoberto Noel, MD, 15 mL at 05/04/20 2030 .  dextrose 5 %-0.45 % sodium chloride infusion, , Intravenous, Continuous, Norm Parcel, PA-C, Last Rate: 50 mL/hr at 05/05/20 1257, New Bag at 05/05/20 1257 .  enoxaparin (LOVENOX) injection 40 mg, 40 mg, Subcutaneous, Q24H, Saverio Danker, PA-C, 40 mg at 05/05/20 1255 .  finasteride (PROSCAR) tablet 5 mg, 5 mg, Oral, Daily, Norm Parcel, PA-C, 5 mg at 05/05/20 0916 .  insulin aspart (novoLOG) injection 0-20 Units, 0-20 Units, Subcutaneous, Q4H, Rigoberto Noel, MD, 3 Units at 05/05/20 1255 .  lisinopril (ZESTRIL) tablet 40 mg, 40 mg, Oral, Daily, Norm Parcel, PA-C, 40 mg at 05/05/20 0915 .  morphine 2 MG/ML injection 1-4 mg, 1-4 mg, Intravenous, Q2H PRN, Saverio Danker, PA-C, 2 mg at 05/03/20 1052 .  naphazoline-glycerin (CLEAR EYES REDNESS) ophth solution 1-2 drop, 1-2 drop, Both Eyes, QID PRN, Greta Doom, MD .  ondansetron [facility]) injection 4 mg, 4 mg, Intravenous, Q4H PRN, Saverio Danker, PA-C .  ondansetron (ZOFRAN-ODT) disintegrating tablet 4 mg, 4 mg, Oral, Q6H PRN **OR** ondansetron (ZOFRAN) injection 4 mg, 4 mg, Intravenous, Q6H PRN, Saverio Danker, PA-C .  oxybutynin (DITROPAN) tablet 5 mg, 5 mg, Oral, Daily, Norm Parcel, PA-C, 5 mg at 05/05/20 0916 .  oxyCODONE (Oxy IR/ROXICODONE) immediate release tablet 5-10 mg, 5-10 mg, Oral, Q4H PRN, Dwan Bolt, MD, 5 mg at 05/04/20 1709 .  pantoprazole (PROTONIX) EC tablet 40 mg, 40 mg, Oral, Daily, Norm Parcel, PA-C, 40 mg at 05/05/20 0915 .  piperacillin-tazobactam (ZOSYN) IVPB 3.375 g, 3.375 g, Intravenous, Q8H, Saverio Danker, PA-C, Last Rate: 12.5 mL/hr at 05/05/20 1259, 3.375 g at 05/05/20 1259 .   polyvinyl alcohol (LIQUIFILM TEARS) 1.4 % ophthalmic solution 1 drop, 1 drop, Both Eyes, PRN, Norm Parcel, PA-C .  predniSONE (DELTASONE) tablet 20 mg, 20 mg, Oral, BID WC, Kirby-Graham, Karsten Fells, NP .  pyridostigmine (MESTINON) tablet 30 mg, 30 mg, Oral, Q8H, Gardiner Barefoot, NP Labs CBC    Component Value Date/Time   WBC 6.8 05/05/2020 0407   RBC 2.58 (L) 05/05/2020 0407   HGB 7.6 (L) 05/05/2020 0407   HGB 12.3 (L) 03/10/2020 1125   HCT 24.4 (L) 05/05/2020 0407   HCT 38.3 03/10/2020 1125   PLT 140 (L) 05/05/2020 0407   PLT 183 03/10/2020 1125   MCV 94.6 05/05/2020 0407   MCV 87 03/10/2020 1125   MCH 29.5 05/05/2020 0407   MCHC 31.1 05/05/2020 0407   RDW 17.3 (H) 05/05/2020 0407   RDW 19.1 (H) 03/10/2020 1125   LYMPHSABS 0.6 (L) 03/10/2020 1125   MONOABS 0.4 01/24/2020 1534   EOSABS 0.0 03/10/2020 1125   BASOSABS 0.0 03/10/2020 1125    CMP     Component Value Date/Time   NA 140 05/05/2020 0407   NA 142 02/03/2020 1504   K 3.5 05/05/2020 0407   CL 108 05/05/2020 0407   CO2 24 05/05/2020 0407   GLUCOSE 105 (H) 05/05/2020 0407   BUN 24 (H) 05/05/2020 0407   BUN 31 (H) 02/03/2020 1504   CREATININE 1.04 05/05/2020 0407   CREATININE 0.77 01/01/2013 1045   CALCIUM 7.6 (L) 05/05/2020 0407   PROT 4.2 (L) 05/02/2020 0420   PROT 7.5 02/03/2020 1504   ALBUMIN 2.0 (L) 05/02/2020 0420   ALBUMIN 3.7 02/03/2020 1504   AST 79 (H) 05/02/2020 0420   ALT 123 (H) 05/02/2020 0420   ALKPHOS 60 05/02/2020 0420   BILITOT 0.7 05/02/2020 0420   BILITOT 0.2 02/03/2020 1504   GFRNONAA >60 05/05/2020 0407   GFRAA 84 02/03/2020 1504    glycosylated hemoglobin  Lipid Panel     Component Value Date/Time   TRIG 310 (H) 05/01/2020 1518    Impression: 74 yo male with a hx of MG. Neuro was asked to consult to survey MG in case of exacerbation while in hospital. 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  There  have been no signs of MG exacerbation in 2 days.   Recommendations:  -Pt is able to tolerate liquids, diet advanced to FL, so change Prednisone to 37m po bid ac and Mestinon 390mpo q8hrs and continue these meds and doses on discharge.  -Have pt f/up with Dr. PaPosey Prontoneuro, after discharge--2-4 weeks.  -Spinal MRIs were ordered with sedation, but cancelled because pt refused. Stated he needed general anesthesia to have done. Pt just underwent general anesthesia 4 days ago. NP thinks the risk of r/p general anesthesia outweigh the benefits of obtaining the MRIs as in patient. F/up with Dr. PaPosey Prontoutpt. -Discharge date per General Surgery.   -PT/OT per  therapy recommendations.   Neuro will be available as needed. Call with questions.   Pt seen by Clance Boll, MSN, APN-BC/Nurse Practitioner/Neuro. Plan discussed with Dr. Curly Shores who agrees.   Pager: [phone]

## 2020-05-05 NOTE — Progress Notes (Signed)
Neurology Progress Note  S: Spanish interpretor on [employer] used during pt visit.    I am doing well. No double vision. No dysphagia with liquids overnight. No diplopia. Wants to know when he can go home.   O: Current vital signs: BP 138/78 (BP Location: Left Arm)   Pulse 70   Temp 98 F (36.7 C) (Oral)   Resp 15   Ht 5' 7" (1.702 m)   Wt 83 kg   SpO2 98%   BMI 28.66 kg/m  Vital signs in last 24 hours: Temp:  [98 F (36.7 C)-98.3 F (36.8 C)] 98 F (36.7 C) (12/14 1335) Pulse Rate:  [59-75] 70 (12/14 1335) Resp:  [15-16] 15 (12/14 1335) BP: (122-138)/(73-94) 138/78 (12/14 1335) SpO2:  [97 %-100 %] 98 % (12/14 1335)  GENERAL: Awake, alert in NAD HEENT: Normocephalic and atraumatic, dry mm LUNGS: Normal respiratory effort.  CV: RRR on tele.  ABDOMEN: Soft, nontender Ext: warm  NEURO:  Mental Status: AA&Ox3  Speech/Language: speech is without dysarthria.  Naming, repetition, fluency, and comprehension intact.  Cranial Nerves:  II: PERRL. Visual fields full.  III, IV, VI: EOMI. Eyelids elevate symmetrically.  V: Sensation is intact to light touch to face.  VII: Smile is symmetrical. Able to puff cheeks and raise eyebrows.  VIII: hearing intact to voice. IX, X: Palate elevates symmetrically. Phonation is normal.  XI: Shoulder shrug 5/5. XII: tongue is midline without fasciculations. Motor: 5/5 strength to all muscle groups tested.  Tone: is normal and bulk is normal Sensation- Intact to light touch bilaterally. Extinction intact.    Coordination: FTN intact bilaterally. No UE drift.  DTRs: 2+ throughout Gait- deferred  Medications  Current Facility-Administered Medications:  .  0.9 %  sodium chloride infusion, , Intravenous, PRN, Alva, Rakesh V, MD, Last Rate: 10 mL/hr at 05/03/20 1900, Infusion Verify at 05/03/20 1900 .  acetaminophen (TYLENOL) tablet 650 mg, 650 mg, Oral, Q6H PRN, Johnson, Kelly R, PA-C .  amLODipine (NORVASC) tablet 5 mg, 5 mg, Oral,  Daily, Johnson, Kelly R, PA-C, 5 mg at 05/05/20 0916 .  chlorhexidine gluconate (MEDLINE KIT) (PERIDEX) 0.12 % solution 15 mL, 15 mL, Mouth Rinse, BID, Alva, Rakesh V, MD, 15 mL at 05/04/20 2030 .  dextrose 5 %-0.45 % sodium chloride infusion, , Intravenous, Continuous, Johnson, Kelly R, PA-C, Last Rate: 50 mL/hr at 05/05/20 1257, New Bag at 05/05/20 1257 .  enoxaparin (LOVENOX) injection 40 mg, 40 mg, Subcutaneous, Q24H, Osborne, Kelly, PA-C, 40 mg at 05/05/20 1255 .  finasteride (PROSCAR) tablet 5 mg, 5 mg, Oral, Daily, Johnson, Kelly R, PA-C, 5 mg at 05/05/20 0916 .  insulin aspart (novoLOG) injection 0-20 Units, 0-20 Units, Subcutaneous, Q4H, Alva, Rakesh V, MD, 3 Units at 05/05/20 1255 .  lisinopril (ZESTRIL) tablet 40 mg, 40 mg, Oral, Daily, Johnson, Kelly R, PA-C, 40 mg at 05/05/20 0915 .  morphine 2 MG/ML injection 1-4 mg, 1-4 mg, Intravenous, Q2H PRN, Osborne, Kelly, PA-C, 2 mg at 05/03/20 1052 .  naphazoline-glycerin (CLEAR EYES REDNESS) ophth solution 1-2 drop, 1-2 drop, Both Eyes, QID PRN, Kirkpatrick, McNeill P, MD .  ondansetron (ZOFRAN) injection 4 mg, 4 mg, Intravenous, Q4H PRN, Osborne, Kelly, PA-C .  ondansetron (ZOFRAN-ODT) disintegrating tablet 4 mg, 4 mg, Oral, Q6H PRN **OR** ondansetron (ZOFRAN) injection 4 mg, 4 mg, Intravenous, Q6H PRN, Osborne, Kelly, PA-C .  oxybutynin (DITROPAN) tablet 5 mg, 5 mg, Oral, Daily, Johnson, Kelly R, PA-C, 5 mg at 05/05/20 0916 .  oxyCODONE (Oxy IR/ROXICODONE)   immediate release tablet 5-10 mg, 5-10 mg, Oral, Q4H PRN, Dwan Bolt, MD, 5 mg at 05/04/20 1709 .  pantoprazole (PROTONIX) EC tablet 40 mg, 40 mg, Oral, Daily, Norm Parcel, PA-C, 40 mg at 05/05/20 0915 .  piperacillin-tazobactam (ZOSYN) IVPB 3.375 g, 3.375 g, Intravenous, Q8H, Saverio Danker, PA-C, Last Rate: 12.5 mL/hr at 05/05/20 1259, 3.375 g at 05/05/20 1259 .  polyvinyl alcohol (LIQUIFILM TEARS) 1.4 % ophthalmic solution 1 drop, 1 drop, Both Eyes, PRN, Norm Parcel,  PA-C .  predniSONE (DELTASONE) tablet 20 mg, 20 mg, Oral, BID WC, Kirby-Graham, Karsten Fells, NP .  pyridostigmine (MESTINON) tablet 30 mg, 30 mg, Oral, Q8H, Gardiner Barefoot, NP Labs CBC    Component Value Date/Time   WBC 6.8 05/05/2020 0407   RBC 2.58 (L) 05/05/2020 0407   HGB 7.6 (L) 05/05/2020 0407   HGB 12.3 (L) 03/10/2020 1125   HCT 24.4 (L) 05/05/2020 0407   HCT 38.3 03/10/2020 1125   PLT 140 (L) 05/05/2020 0407   PLT 183 03/10/2020 1125   MCV 94.6 05/05/2020 0407   MCV 87 03/10/2020 1125   MCH 29.5 05/05/2020 0407   MCHC 31.1 05/05/2020 0407   RDW 17.3 (H) 05/05/2020 0407   RDW 19.1 (H) 03/10/2020 1125   LYMPHSABS 0.6 (L) 03/10/2020 1125   MONOABS 0.4 01/24/2020 1534   EOSABS 0.0 03/10/2020 1125   BASOSABS 0.0 03/10/2020 1125    CMP     Component Value Date/Time   NA 140 05/05/2020 0407   NA 142 02/03/2020 1504   K 3.5 05/05/2020 0407   CL 108 05/05/2020 0407   CO2 24 05/05/2020 0407   GLUCOSE 105 (H) 05/05/2020 0407   BUN 24 (H) 05/05/2020 0407   BUN 31 (H) 02/03/2020 1504   CREATININE 1.04 05/05/2020 0407   CREATININE 0.77 01/01/2013 1045   CALCIUM 7.6 (L) 05/05/2020 0407   PROT 4.2 (L) 05/02/2020 0420   PROT 7.5 02/03/2020 1504   ALBUMIN 2.0 (L) 05/02/2020 0420   ALBUMIN 3.7 02/03/2020 1504   AST 79 (H) 05/02/2020 0420   ALT 123 (H) 05/02/2020 0420   ALKPHOS 60 05/02/2020 0420   BILITOT 0.7 05/02/2020 0420   BILITOT 0.2 02/03/2020 1504   GFRNONAA >60 05/05/2020 0407   GFRAA 84 02/03/2020 1504    glycosylated hemoglobin  Lipid Panel     Component Value Date/Time   TRIG 310 (H) 05/01/2020 1518    Impression: 74 yo male with a hx of MG. Neuro was asked to consult to survey MG in case of exacerbation while in hospital. 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  There have been no signs of MG exacerbation in 2 days.   Recommendations: -change steroids to po if OK with general  surgery. Dose as below.  -Pt is able to tolerate liquids, diet advanced to FL, so change Prednisone to 73m po bid ac and Mestinon 322mpo q8hrs and continue these meds and doses on discharge.  -Have pt f/up with Dr. PaPosey Prontoneuro, after discharge--2-4 weeks.  -Spinal MRIs were ordered with sedation, but cancelled because pt refused. Stated he needed general anesthesia to have done. Pt just underwent general anesthesia 4 days ago. NP thinks the risk of r/p general anesthesia outweigh the benefits of obtaining the MRIs as in patient. F/up with Dr. PaPosey Prontoutpt. -Discharge date per General Surgery.    Neuro will be available as needed. Call with questions.   Pt  seen by Karen Kirby-Graham, MSN, APN-BC/Nurse Practitioner/Neuro. Plan discussed with Dr. Bhagat who agrees.   Pager: [phone] 

## 2020-05-05 NOTE — Progress Notes (Addendum)
12/14 Pt was communicated through agents Aloha Gell #[id number] and Mickel Baas # [id number]. MRI called pt refused the procedure. Pt was educated. Pt complained of no pain throughout thenight. Pt observed to be comfortable.

## 2020-05-05 NOTE — Progress Notes (Signed)
4 Days Post-Op   Subjective/Chief Complaint: Pt doing well this AM Tol clears well Amb with PT   Objective: Vital signs in last 24 hours: Temp:  [98.3 F (36.8 C)-98.4 F (36.9 C)] 98.3 F (36.8 C) (12/14 0500) Pulse Rate:  [59-75] 59 (12/14 0500) Resp:  [16-17] 16 (12/14 0500) BP: (112-129)/(74-94) 122/76 (12/14 0500) SpO2:  [97 %-100 %] 100 % (12/14 0500) Last BM Date: 05/04/20  Intake/Output from previous day: 12/13 0701 - 12/14 0700 In: 480 [P.O.:480] Out: 1200 [Urine:1050; Stool:150] Intake/Output this shift: No intake/output data recorded.  General appearance: alert GI: soft, non-tender; bowel sounds normal; no masses,  no organomegaly and ostomy patent, inc c/d/i  Lab Results:  Recent Labs    05/03/20 0112 05/05/20 0407  WBC 11.8* 6.8  HGB 8.7* 7.6*  HCT 26.5* 24.4*  PLT 114* 140*   BMET Recent Labs    05/03/20 0112 05/05/20 0407  NA 139 140  K 3.8 3.5  CL 109 108  CO2 21* 24  GLUCOSE 92 105*  BUN 19 24*  CREATININE 1.09 1.04  CALCIUM 7.5* 7.6*   PT/INR No results for input(s): LABPROT, INR in the last 72 hours. ABG No results for input(s): PHART, HCO3 in the last 72 hours.  Invalid input(s): PCO2, PO2  Studies/Results: No results found.  Anti-infectives: Anti-infectives (From admission, onward)   Start     Dose/Rate Route Frequency Ordered Stop   05/01/20 1600  piperacillin-tazobactam (ZOSYN) IVPB 3.375 g        3.375 g 12.5 mL/hr over 240 Minutes Intravenous Every 8 hours 05/01/20 1508     05/01/20 1015  metroNIDAZOLE (FLAGYL) IVPB 500 mg        500 mg 100 mL/hr over 60 Minutes Intravenous  Once 05/01/20 1008 05/01/20 1212   05/01/20 0845  cefTRIAXone (ROCEPHIN) 1 g in sodium chloride 0.9 % 100 mL IVPB  Status:  Discontinued        1 g 200 mL/hr over 30 Minutes Intravenous Every 24 hours 05/01/20 0834 05/01/20 1508      Assessment/Plan: Myasthenia gravis - on 40 mg of prednisone (20 breakfast and 20 at lunch) - currently on IV  solumedrol 32 mg daily and IV pyridostigmine 1 mg TID, neuro following and planning to evaluate for bulbar symptoms with swallowing today HTN - restart home meds  Septic shock secondary to perforated diverticulitis S/P Ex-lap, 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 Georgette Dover - POD#4 - having some stool output from stoma, adv diet to fulls - mobilize - surgical path pending - continue IV abx - WOC to see for teaching on colostomy care  FEN: CLD, decrease IVF to 50 cc/h VTE: lovenox ID: Rocephin/Flagyl 12/10; IV Zosyn 12/10>>  Dispo: Advance to [facility] today. WOC to see. PT/OT. Neurology following for myasthenia.   LOS: 4 days    Danny Walker 05/05/2020

## 2020-05-06 LAB — CULTURE, BLOOD (ROUTINE X 2)
Culture: NO GROWTH
Culture: NO GROWTH

## 2020-05-06 LAB — GLUCOSE, CAPILLARY
Glucose-Capillary: 106 mg/dL — ABNORMAL HIGH (ref 70–99)
Glucose-Capillary: 108 mg/dL — ABNORMAL HIGH (ref 70–99)
Glucose-Capillary: 120 mg/dL — ABNORMAL HIGH (ref 70–99)
Glucose-Capillary: 123 mg/dL — ABNORMAL HIGH (ref 70–99)
Glucose-Capillary: 161 mg/dL — ABNORMAL HIGH (ref 70–99)
Glucose-Capillary: 75 mg/dL (ref 70–99)

## 2020-05-06 MED ORDER — MORPHINE SULFATE (PF) 2 MG/ML IV SOLN: 2.0000 mg | INTRAVENOUS | Status: DC | PRN

## 2020-05-06 MED ORDER — OXYCODONE HCL 5 MG PO TABS
5.0000 mg | ORAL_TABLET | ORAL | Status: DC | PRN
  Administered 2020-05-06: 10 mg via ORAL
  Filled 2020-05-06: qty 2

## 2020-05-06 NOTE — Progress Notes (Signed)
Neuro f/up note re: MRI spine. Pt was finally able to undergo testing with just Ativan sedation.    MRI THORACIC SPINE FINDINGS  Alignment:  Physiologic.  Vertebrae: No fracture, evidence of discitis, or bone lesion.  Cord:  Normal signal and morphology.  Paraspinal and other soft tissues: Small pleural effusions  Disc levels:  T7-8: Small right subarticular disc protrusion without stenosis.  T9-10: Small central disc protrusion and annular fissure. No Stenosis.  MRI LUMBAR SPINE FINDINGS  Segmentation:  Standard.  Alignment:  Dextroscoliosis with apex at L3  Vertebrae:  No fracture, evidence of discitis, or bone lesion.  Conus medullaris and cauda equina: Conus extends to the L1 level. Conus and cauda equina appear normal.  Paraspinal and other soft tissues: Negative  Disc levels:  L1-L2: Left asymmetric disc bulge and left-greater-than-right facet hypertrophy. Left lateral recess narrowing without central spinal canal stenosis. No right, moderate left neural foraminal stenosis.  L2-L3: Small left asymmetric disc bulge. No spinal canal stenosis. No neural foraminal stenosis.  L3-L4: Right asymmetric disc bulge and mild facet hypertrophy. Right lateral recess narrowing without central spinal canal stenosis. No right, but mild left neural foraminal stenosis.  L4-L5: Severe facet hypertrophy with right asymmetric disc bulge. Right lateral recess narrowing without central spinal canal stenosis. Severe right and mild left neural foraminal stenosis.  L5-S1: Right asymmetric disc bulge. No spinal canal stenosis. Severe right and no left neural foraminal stenosis.  Visualized sacrum: Normal.  IMPRESSION: 1. No acute abnormality of the thoracic spine. No spinal canal or neural foraminal stenosis. 2. 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 3. 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 4. Multilevel lateral recess narrowing without  central spinal canal Stenosis.  No acute findings. No cord compression. He can f/up with Dr. Posey Pronto outpt and we will defer to her for referral to [facility] if needed.   Clance Boll, NP/Neuro

## 2020-05-06 NOTE — Progress Notes (Signed)
Physical Therapy Treatment Patient Details Name: Danny Walker MRN: [id number] DOB: 1945-11-09 Today's Date: 05/06/2020    History of Present Illness Patient is a 74 y/o male who presents with abdominal pain; found to have septic shock secondary to perforated diverticulitis, now s/p washout and Hartman's procedure 93/57, complicated by post op respiratory failure extubated 12/11. 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    PT Comments    Pt up in chair, agreeable to therapy session and with good participation and tolerance for mobility. Pt with improved activity tolerance this session, progressed gait distance to 64ft x2 with standing break (118ft total) using RW and min guard. Pt continues to need min to modA for sit<>stand transfers from chair height to RW and cues for safety. Pt performed standing BLE AROM therapeutic exercises as detailed below with good tolerance. Pt will continue to benefit from skilled rehab in a high intensity post-acute setting to maximize functional gains before returning home.   Follow Up Recommendations  Supervision for mobility/OOB;CIR     Equipment Recommendations  Rolling walker with 5" wheels    Recommendations for Other Services       Precautions / Restrictions Precautions Precautions: Fall;Other (comment) Precaution Comments: watch 02, colostomy Restrictions Weight Bearing Restrictions: No    Mobility  Bed Mobility                  Transfers Overall transfer level: Needs assistance Equipment used: Rolling walker (2 wheeled) Transfers: Sit to/from Stand Sit to Stand: Min assist;Mod assist         General transfer comment: minA to power up, cues for hand placement. ModA for safety with stand to sit due to BLE fatigue x3 total reps  Ambulation/Gait Ambulation/Gait assistance: Min guard Gait Distance (Feet): 100 Feet Assistive device: Rolling walker (2 wheeled) Gait  Pattern/deviations: Shuffle;Step-through pattern;Decreased stride length;Trunk flexed Gait velocity: decr   General Gait Details: SpO2 88-94% on 1L O2 [place] during mobility, improves after seated rest break; HR 72-94 bpm; pt needs min cues for body orientation with RW and activity pacing, c/o BLE weakness but orthostatics stable per dinamap   Stairs             Wheelchair Mobility    Modified Rankin (Stroke Patients Only)       Balance Overall balance assessment: History of Falls;Needs assistance Sitting-balance support: Feet supported;Bilateral upper extremity supported Sitting balance-Leahy Scale: Fair     Standing balance support: During functional activity;Bilateral upper extremity supported Standing balance-Leahy Scale: Poor Standing balance comment: reliant on UE support in standing                            Cognition Arousal/Alertness: Awake/alert Behavior During Therapy: WFL for tasks assessed/performed Overall Cognitive Status: No family/caregiver present to determine baseline cognitive functioning                                 General Comments: Pt pleasantly participatory but needs cues for activity pacing and safety with mobility; Pt very motivated to go home alone but decreased awareness of physical deficits      Exercises General Exercises - Lower Extremity Hip Flexion/Marching: AROM;Strengthening;10 reps;Standing Heel Raises: AROM;Strengthening;Both;10 reps;Standing (at RW) Other Exercises Other Exercises: Standing BLE AROM: hamstring curls 1x10 reps at [place] comments (skin integrity,  edema, etc.): live interpreter Graciela during session; see gait comments for VS during mobility; cues for pursed-lip breathing and activity pacing needed      Pertinent Vitals/Pain Pain Assessment: 0-10 Pain Score: 5  Pain Location: stomach Pain Descriptors / Indicators: Sore;Operative site guarding;Guarding Pain  Intervention(s): Monitored during session;Premedicated before session;Repositioned   Vitals:   05/06/20 1446  BP: 132/82  Pulse: 68  Resp: 16  Temp: 98.4 F (36.9 C)  SpO2: 91%    Home Living                      Prior Function            PT Goals (current goals can now be found in the care plan section) Acute Rehab PT Goals Patient Stated Goal: to go home PT Goal Formulation: With patient Time For Goal Achievement: 05/17/20 Potential to Achieve Goals: Good Progress towards PT goals: Progressing toward goals    Frequency    Min 3X/week      PT Plan Current plan remains appropriate    Co-evaluation              AM-PAC PT "6 Clicks" Mobility   Outcome Measure  Help needed turning from your back to your side while in a flat bed without using bedrails?: A Lot Help needed moving from lying on your back to sitting on the side of a flat bed without using bedrails?: A Lot Help needed moving to and from a bed to a chair (including a wheelchair)?: A Lot Help needed standing up from a chair using your arms (e.g., wheelchair or bedside chair)?: A Little Help needed to walk in hospital room?: A Little Help needed climbing 3-5 steps with a railing? : A Lot 6 Click Score: 14    End of Session Equipment Utilized During Treatment: Oxygen Activity Tolerance: Patient tolerated treatment well Patient left: in chair;with chair alarm set;with call bell/phone within reach Nurse Communication: Mobility status PT Visit Diagnosis: Pain;Difficulty in walking, not elsewhere classified (R26.2);Unsteadiness on feet (R26.81) Pain - part of body:  (abdomen)     Time: 3244-0102 PT Time Calculation (min) (ACUTE ONLY): 31 min  Charges:  $Gait Training: 8-22 mins $Therapeutic Exercise: 8-22 mins                     Carly P., PTA Acute Rehabilitation Services Pager: [phone] Office: [place] 05/06/2020, 3:16 PM

## 2020-05-06 NOTE — Progress Notes (Signed)
Occupational Therapy Treatment Patient Details Name: Quadre Bristol MRN: [id number] DOB: [birth year] Today's Date: 05/06/2020    History of present illness Patient is a 74 y/o male who presents with abdominal pain; found to have septic shock secondary to perforated diverticulitis, now s/p washout and Hartman's procedure 50/35, complicated by post op respiratory failure extubated 12/11. 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   OT comments  Pt progressing towards O2 goals but continues to be limited by O2 desats and weakness. Pt also noted with short term memory deficits reporting he has not been out of bed in 5 days (this therapist has seen him more recently than this). Pt overall Mod A for sit to stand transfers, able to demonstrate one instance of Min A at end of session with RW and min guard for short mobility in room. Pt unable to reach feet from recliner for LB dressing, may benefit from AE education to maximize independence at home. Also plan to instruct in UE HEP to maximize strength and provide spanish handout for energy conservation if pt discharges with supplemental O2. Continue to recommend CIR as pt will have difficulty completing ADLs and IADLs safely at home when previously independent in all tasks without use of AD.   Pt received on 1 L O2 with O2 at 93% at rest. Trialed pt on RA with desats to 82% with activity, 88% on 1 L O2. Pt left on 1 L O2 at end of session.     Follow Up Recommendations  CIR    Equipment Recommendations  3 in 1 bedside commode;Other (comment) (Rollator)    Recommendations for Other Services Rehab consult    Precautions / Restrictions Precautions Precautions: Fall;Other (comment) Precaution Comments: watch 02, colostomy Restrictions Weight Bearing Restrictions: No       Mobility Bed Mobility Overal bed mobility: Needs Assistance Bed Mobility: Supine to Sit     Supine to sit: Mod assist;HOB  elevated     General bed mobility comments: Heavy assist for trunk advancement to EOB, use of bed rails and handheld assist from therapist  Transfers Overall transfer level: Needs assistance Equipment used: Rolling walker (2 wheeled) Transfers: Sit to/from Stand Sit to Stand: Mod assist         General transfer comment: Mod A for sit to stand transfers and power up, cues for hand placement. Able to progress to Min A for sit to stand on last attempt during session.    Balance Overall balance assessment: History of Falls;Needs assistance Sitting-balance support: Feet supported;Bilateral upper extremity supported Sitting balance-Leahy Scale: Fair     Standing balance support: During functional activity;Bilateral upper extremity supported Standing balance-Leahy Scale: Poor Standing balance comment: reliant on UE support in standing                           ADL either performed or assessed with clinical judgement   ADL Overall ADL's : Needs assistance/impaired Eating/Feeding: Set up;Sitting Eating/Feeding Details (indicate cue type and reason): Setup for breakfast, able to open packaging                 Lower Body Dressing: Moderate assistance;Sitting/lateral leans;Sit to/from stand Lower Body Dressing Details (indicate cue type and reason): Unable to reach feet seated in recliner (reports he sits in office chair that is adjustable or on EOB for tasks though reports EOB not low and pt reports need for lower  surface to reach feet). May benefit from AE instruction.             Functional mobility during ADLs: Min guard;Rolling walker;Cueing for safety;Cueing for sequencing General ADL Comments: Pt with continued deficits in cardiopulmonary tolerance (improving O2 requirements but still desats), decreased strength     Vision   Vision Assessment?: No apparent visual deficits   Perception     Praxis      Cognition Arousal/Alertness: Awake/alert Behavior  During Therapy: WFL for tasks assessed/performed;Anxious Overall Cognitive Status: No family/caregiver present to determine baseline cognitive functioning                                 General Comments: Pt with decreased short term memory, reports he has not been out of bed in 5 days though therapy saw him 2 days ago. Pt lacks insight into deficits, increased fall risk and decreased ability to care for self at home without increased support.        Exercises     Shoulder Instructions       General Comments Interpreter, [place] utilized during session. Pt received on 1 L O2, spO2 at 93%. trialed on RA with desats to 82% with mobility. Trialed with 1 L O2 with SpO2 at 88% after mobility. Cued for pursed lip breathing and energy conservation    Pertinent Vitals/ Pain       Pain Assessment: No/denies pain  Home Living                                          Prior Functioning/Environment              Frequency  Min 2X/week        Progress Toward Goals  OT Goals(current goals can now be found in the care plan section)  Progress towards OT goals: Progressing toward goals  Acute Rehab OT Goals Patient Stated Goal: to go home OT Goal Formulation: With patient Time For Goal Achievement: 05/18/20 Potential to Achieve Goals: Good ADL Goals Pt Will Perform Grooming: with modified independence;standing Pt Will Perform Lower Body Bathing: with modified independence;sit to/from stand;sitting/lateral leans Pt Will Perform Lower Body Dressing: with modified independence;sit to/from stand;sitting/lateral leans Pt Will Transfer to Toilet: with supervision;ambulating Pt Will Perform Toileting - Clothing Manipulation and hygiene: with modified independence;sit to/from stand;sitting/lateral leans Pt/caregiver will Perform Home Exercise Program: Increased strength;Both right and left upper extremity;With theraband;Independently;With written HEP  provided Additional ADL Goal #1: Pt to implement 2 energy conservation strategies during ADLs/mobility to maintain SpO2 >88%  Plan Discharge plan remains appropriate    Co-evaluation                 AM-PAC OT "6 Clicks" Daily Activity     Outcome Measure   Help from another person eating meals?: A Little Help from another person taking care of personal grooming?: A Little Help from another person toileting, which includes using toliet, bedpan, or urinal?: A Lot Help from another person bathing (including washing, rinsing, drying)?: A Lot Help from another person to put on and taking off regular upper body clothing?: A Little Help from another person to put on and taking off regular lower body clothing?: A Lot 6 Click Score: 15    End of Session Equipment Utilized During Treatment: Gait belt;Rolling walker;Oxygen  OT Visit Diagnosis: Unsteadiness on feet (R26.81);Other abnormalities of gait and mobility (R26.89);Muscle weakness (generalized) (M62.81);Other (comment);Pain   Activity Tolerance Patient tolerated treatment well   Patient Left in chair;with call bell/phone within reach;with chair alarm set   Nurse Communication Mobility status;Other (comment) (O2)        Time: 0164-2903 OT Time Calculation (min): 43 min  Charges: OT General Charges $OT Visit: 1 Visit OT Treatments $Self Care/Home Management : 8-22 mins $Therapeutic Activity: 23-37 mins  Layla Maw, OTR/L   Layla Maw 05/06/2020, 9:45 AM

## 2020-05-06 NOTE — Progress Notes (Signed)
PT Cancellation Note  Patient Details Name: Danny Walker MRN: [id number] DOB: 1946-01-14   Cancelled Treatment:    Reason Eval/Treat Not Completed: (P) Other (comment) (pt working with wound ostomy RN) Per RN session will likely last ~45 mins. Will re-attempt later in day per PT POC as schedule permits.   [name] M Diar Berkel 05/06/2020, 11:28 AM

## 2020-05-06 NOTE — TOC Initial Note (Signed)
Transition of Care [facility]) - Initial/Assessment Note    Patient Details  Name: Danny Walker MRN: [id number] Date of Birth: 1945-07-05  Transition of Care [facility]) CM/SW Contact:    Curlene Labrum, RN Phone Number: 05/06/2020, 4:24 PM  Clinical Narrative:                 Case management continues to follow the patient for possible CIR admission R/T diverticulitis and S/P new colostomy after Hartman's procedure (patient with history of MG).  Will continue to follow the patient for discharge plans.  Expected Discharge Plan: IP Rehab Facility Barriers to Discharge: Continued Medical Work up   Patient Goals and CMS Choice Patient states their goals for this hospitalization and ongoing recovery are:: Plans for possible admission to CIR - lives at home alone.      Expected Discharge Plan and Services Expected Discharge Plan: [place]   Discharge Planning Services: CM Consult Post Acute Care Choice: IP Rehab Living arrangements for the past 2 months: Single Family Home                                      Prior Living Arrangements/Services Living arrangements for the past 2 months: Single Family Home Lives with:: Self Patient language and need for interpreter reviewed:: Yes Do you feel safe going back to the place where you live?: Yes      Need for Family Participation in Patient Care: Yes (Comment) Care giver support system in place?: Yes (comment)   Criminal Activity/Legal Involvement Pertinent to Current Situation/Hospitalization: No - Comment as needed  Activities of Daily Living Home Assistive Devices/Equipment: Cane (specify quad or straight) ADL Screening (condition at time of admission) Patient's cognitive ability adequate to safely complete daily activities?: Yes Is the patient deaf or have difficulty hearing?: No Does the patient have difficulty seeing, even when wearing glasses/contacts?: No Does the patient have difficulty  concentrating, remembering, or making decisions?: No Patient able to express need for assistance with ADLs?: Yes Does the patient have difficulty dressing or bathing?: No Independently performs ADLs?: No Communication: Independent Dressing (OT): Needs assistance Is this a change from baseline?: Pre-admission baseline Grooming: Needs assistance Is this a change from baseline?: Pre-admission baseline Feeding: Independent Bathing: Needs assistance Is this a change from baseline?: Pre-admission baseline Toileting: Needs assistance In/Out Bed: Needs assistance Does the patient have difficulty walking or climbing stairs?: No Weakness of Legs: Both Weakness of Arms/Hands: None  Permission Sought/Granted Permission sought to share information with : Case Manager Permission granted to share information with : Yes, Verbal Permission Granted     Permission granted to share info w AGENCY: CIR        Emotional Assessment       Orientation: : Oriented to Self,Oriented to Place,Oriented to  Time,Oriented to Situation   Psych Involvement: No (comment)  Admission diagnosis:  Diverticulitis [K57.92] Perforation of sigmoid colon due to diverticulitis [K57.20] Sepsis without acute organ dysfunction, due to unspecified organism [facility]) [A41.9] Patient Active Problem List   Diagnosis Date Noted  . Diverticulitis 05/01/2020  . Endotracheally intubated   . Perforation of sigmoid colon due to diverticulitis   . Encephalopathy acute   . Peritonitis ([place])   . Leg weakness 03/23/2020  . Visual disturbance 03/23/2020  . Normocytic anemia 03/10/2020  . Myasthenia gravis ([place]) 03/02/2020  . Myasthenia gravis with acute exacerbation ([place]) 01/24/2020  . Hip  pain 09/30/2019  . Right shoulder pain 08/20/2019  . Health care maintenance 12/25/2018  . Gastroesophageal reflux disease 04/18/2018  . Essential hypertension 04/18/2018  . Protein-calorie malnutrition, severe 04/06/2018  . Myasthenia gravis,  bulbar ([place]) 04/06/2018  . Bulbar weakness ([place]) 04/03/2018  . Unintentional weight loss 04/03/2018  . BPH (benign prostatic hyperplasia) 03/05/2018  . Umbilical hernia 83/66/2947   PCP:  [name], MD Pharmacy:   [facility], [place] [address] [place] [address] Phone: [phone] Fax: [phone]     Social Determinants of Health (SDOH) Interventions    Readmission Risk Interventions Readmission Risk Prevention Plan 05/06/2020  Transportation Screening Complete  PCP or Specialist Appt within 3-5 Days Complete  HRI or [place] Complete  Social Work Consult for [place] Planning/Counseling Complete  Palliative Care Screening Complete  Medication Review Press photographer) Complete  Some recent data might be hidden

## 2020-05-06 NOTE — Progress Notes (Signed)
NIF -40 VC 1L   Both with great effort.

## 2020-05-06 NOTE — Consult Note (Signed)
[place] Nurse ostomy follow up Patient receiving care in [facility] 5N25. Interpreter Junius Roads operator [phone] utilized throughout teaching session. Stoma type/location: LUQ colostomy Stomal assessment/size: 1 1/4 inches, round, budded, edematous Peristomal assessment: intact. Some slight bleeding at the stoma skin junction when old pouch removed and area cleansed. Treatment options for stomal/peristomal skin: barrier ring Output: thick green Ostomy pouching: 1pc. Flat Lawson #[phone] and barrier ring Kellie Simmering [phone]. Education provided:  Patient emptied existing pouch, then removed it. I assisted the patient to measure the stoma and started the cut on the new pouch. He completed the cut; I did need to enlarge it slightly. I demonstrated how to use and place the barrier ring. I assisted him with placing the new pouch. He removed the tape backing and applied the tape borders to the skin.  He asked many appropriate questions which I answered.   Enrolled patient in [facility] Discharge program: No--I plan to enroll him tomorrow. He has agreed to read the education materials I provided earlier in the week, so that I can answer any questions he has tomorrow. He tells me he is going directly to his house when he is discharged, not to a rehab center, and not to his daughter's home.  I requested 7 additional pouches and barrier rings. Val Riles, RN, MSN, CWOCN, CNS-BC, pager [phone]

## 2020-05-06 NOTE — Progress Notes (Signed)
Inpatient Rehabilitation Admissions Coordinator                          Inpatient rehab consult received., I will meet with patient tomorrow for assessment of candidacy for a possible [facility] admit.  Danne Baxter, RN, MSN Rehab Admissions Coordinator [phone] 05/06/2020 4:20 PM \

## 2020-05-06 NOTE — Progress Notes (Signed)
NIF=> - 40 on three attempts. Great Effort.

## 2020-05-06 NOTE — Progress Notes (Signed)
[facility] Progress Note  5 Days Post-Op  Subjective: CC-  Feeling well this morning. Denies abdominal pain, nausea, vomiting. Tolerating full liquids. Colostomy functioning.  Objective: Vital signs in last 24 hours: Temp:  [98 F (36.7 C)-98.8 F (37.1 C)] 98.3 F (36.8 C) (12/15 0734) Pulse Rate:  [58-70] 68 (12/15 0734) Resp:  [15-17] 17 (12/15 0734) BP: (125-142)/(73-86) 134/86 (12/15 0734) SpO2:  [96 %-100 %] 100 % (12/15 0734) Last BM Date: 05/05/20  Intake/Output from previous day: 12/14 0701 - 12/15 0700 In: 2209.6 [P.O.:1160; I.V.:732.1; IV Piggyback:317.5] Out: 1275 [Urine:1275] Intake/Output this shift: No intake/output data recorded.  PE: General: pleasant, NAD Heart: regular, rate, and rhythm.   Lungs:  Respiratory effort nonlabored on 1L Abd: soft, appropriately ttp, mild distension, +BS, midline incision cdi with staples intact and no erythema or drainage, stoma viable with loose brown stool in pouch  Lab Results:  Recent Labs    05/05/20 0407  WBC 6.8  HGB 7.6*  HCT 24.4*  PLT 140*   BMET Recent Labs    05/05/20 0407  NA 140  K 3.5  CL 108  CO2 24  GLUCOSE 105*  BUN 24*  CREATININE 1.04  CALCIUM 7.6*   PT/INR No results for input(s): LABPROT, INR in the last 72 hours. CMP     Component Value Date/Time   NA 140 05/05/2020 0407   NA 142 02/03/2020 1504   K 3.5 05/05/2020 0407   CL 108 05/05/2020 0407   CO2 24 05/05/2020 0407   GLUCOSE 105 (H) 05/05/2020 0407   BUN 24 (H) 05/05/2020 0407   BUN 31 (H) 02/03/2020 1504   CREATININE 1.04 05/05/2020 0407   CREATININE 0.77 01/01/2013 1045   CALCIUM 7.6 (L) 05/05/2020 0407   PROT 4.2 (L) 05/02/2020 0420   PROT 7.5 02/03/2020 1504   ALBUMIN 2.0 (L) 05/02/2020 0420   ALBUMIN 3.7 02/03/2020 1504   AST 79 (H) 05/02/2020 0420   ALT 123 (H) 05/02/2020 0420   ALKPHOS 60 05/02/2020 0420   BILITOT 0.7 05/02/2020 0420   BILITOT 0.2 02/03/2020 1504   GFRNONAA >60 05/05/2020 0407    GFRAA 84 02/03/2020 1504   Lipase     Component Value Date/Time   LIPASE 43 05/01/2020 0336       Studies/Results: MR THORACIC SPINE WO CONTRAST  Result Date: 05/05/2020 CLINICAL DATA:  Leg weakness and urinary retention. EXAM: MRI THORACIC AND LUMBAR SPINE WITHOUT CONTRAST TECHNIQUE: Multiplanar and multiecho pulse sequences of the thoracic and lumbar spine were obtained without intravenous contrast. COMPARISON:  None. FINDINGS: MRI THORACIC SPINE FINDINGS Alignment:  Physiologic. Vertebrae: No fracture, evidence of discitis, or bone lesion. Cord:  Normal signal and morphology. Paraspinal and other soft tissues: Small pleural effusions Disc levels: T7-8: Small right subarticular disc protrusion without stenosis. T9-10: Small central disc protrusion and annular fissure. No stenosis. MRI LUMBAR SPINE FINDINGS Segmentation:  Standard. Alignment:  Dextroscoliosis with apex at L3 Vertebrae:  No fracture, evidence of discitis, or bone lesion. Conus medullaris and cauda equina: Conus extends to the L1 level. Conus and cauda equina appear normal. Paraspinal and other soft tissues: Negative Disc levels: L1-L2: Left asymmetric disc bulge and left-greater-than-right facet hypertrophy. Left lateral recess narrowing without central spinal canal stenosis. No right, moderate left neural foraminal stenosis. L2-L3: Small left asymmetric disc bulge. No spinal canal stenosis. No neural foraminal stenosis. L3-L4: Right asymmetric disc bulge and mild facet hypertrophy. Right lateral recess narrowing without central spinal canal stenosis. No right, but  mild left neural foraminal stenosis. L4-L5: Severe facet hypertrophy with right asymmetric disc bulge. Right lateral recess narrowing without central spinal canal stenosis. Severe right and mild left neural foraminal stenosis. L5-S1: Right asymmetric disc bulge. No spinal canal stenosis. Severe right and no left neural foraminal stenosis. Visualized sacrum: Normal.  IMPRESSION: 1. No acute abnormality of the thoracic spine. No spinal canal or neural foraminal stenosis. 2. 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 3. 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 4. Multilevel lateral recess narrowing without central spinal canal stenosis. Electronically Signed   By: Ulyses Jarred M.D.   On: 05/05/2020 20:32   MR LUMBAR SPINE WO CONTRAST  Result Date: 05/05/2020 CLINICAL DATA:  Leg weakness and urinary retention. EXAM: MRI THORACIC AND LUMBAR SPINE WITHOUT CONTRAST TECHNIQUE: Multiplanar and multiecho pulse sequences of the thoracic and lumbar spine were obtained without intravenous contrast. COMPARISON:  None. FINDINGS: MRI THORACIC SPINE FINDINGS Alignment:  Physiologic. Vertebrae: No fracture, evidence of discitis, or bone lesion. Cord:  Normal signal and morphology. Paraspinal and other soft tissues: Small pleural effusions Disc levels: T7-8: Small right subarticular disc protrusion without stenosis. T9-10: Small central disc protrusion and annular fissure. No stenosis. MRI LUMBAR SPINE FINDINGS Segmentation:  Standard. Alignment:  Dextroscoliosis with apex at L3 Vertebrae:  No fracture, evidence of discitis, or bone lesion. Conus medullaris and cauda equina: Conus extends to the L1 level. Conus and cauda equina appear normal. Paraspinal and other soft tissues: Negative Disc levels: L1-L2: Left asymmetric disc bulge and left-greater-than-right facet hypertrophy. Left lateral recess narrowing without central spinal canal stenosis. No right, moderate left neural foraminal stenosis. L2-L3: Small left asymmetric disc bulge. No spinal canal stenosis. No neural foraminal stenosis. L3-L4: Right asymmetric disc bulge and mild facet hypertrophy. Right lateral recess narrowing without central spinal canal stenosis. No right, but mild left neural foraminal stenosis. L4-L5: Severe facet hypertrophy with right asymmetric disc bulge. Right lateral recess narrowing without  central spinal canal stenosis. Severe right and mild left neural foraminal stenosis. L5-S1: Right asymmetric disc bulge. No spinal canal stenosis. Severe right and no left neural foraminal stenosis. Visualized sacrum: Normal. IMPRESSION: 1. No acute abnormality of the thoracic spine. No spinal canal or neural foraminal stenosis. 2. 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 3. 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 4. Multilevel lateral recess narrowing without central spinal canal stenosis. Electronically Signed   By: Ulyses Jarred M.D.   On: 05/05/2020 20:32    Anti-infectives: Anti-infectives (From admission, onward)   Start     Dose/Rate Route Frequency Ordered Stop   05/01/20 1600  piperacillin-tazobactam (ZOSYN) IVPB 3.375 g        3.375 g 12.5 mL/hr over 240 Minutes Intravenous Every 8 hours 05/01/20 1508 05/06/20 2359   05/01/20 1015  metroNIDAZOLE (FLAGYL) IVPB 500 mg        500 mg 100 mL/hr over 60 Minutes Intravenous  Once 05/01/20 1008 05/01/20 1212   05/01/20 0845  cefTRIAXone (ROCEPHIN) 1 g in sodium chloride 0.9 % 100 mL IVPB  Status:  Discontinued        1 g 200 mL/hr over 30 Minutes Intravenous Every 24 hours 05/01/20 0834 05/01/20 1508       Assessment/Plan Myasthenia gravis - on prednisone 20mg  PO BID ac and Mestinon 30mg  po q8hrs >> continue these at discharge. Follow up Dr. Posey Pronto. Neurology has signed off HTN -restart home meds  Septic shock secondary to perforated diverticulitis S/PEx-lap, sigmoid colectomy with end colostomy  05/01/20 Dr. Georgette Dover - POD#5 - advance to soft diet - mobilize, continue therapies. Recommend CIR, will ask inpatient rehab to consult - surgical path: Diverticulitis with evidence of perforation, Two benign lymph nodes, No dysplasia or malignancy - today with complete 5 days of zosyn, d/c after today - WOC following  FEN:d/c IVF, soft diet XUX:YBFXOVA NV:[id number]; IV Zosyn 12/10>>12/15     LOS: 5 days     Wellington [place], [facility] 05/06/2020, 9:12 AM Please see Amion for pager number during day hours 7:00am-4:30pm

## 2020-05-07 ENCOUNTER — Inpatient Hospital Stay: Admission: RE | Admit: 2020-05-07 | Payer: Medicare Other | Source: Ambulatory Visit

## 2020-05-07 LAB — GLUCOSE, CAPILLARY
Glucose-Capillary: 102 mg/dL — ABNORMAL HIGH (ref 70–99)
Glucose-Capillary: 122 mg/dL — ABNORMAL HIGH (ref 70–99)
Glucose-Capillary: 134 mg/dL — ABNORMAL HIGH (ref 70–99)
Glucose-Capillary: 76 mg/dL (ref 70–99)
Glucose-Capillary: 84 mg/dL (ref 70–99)
Glucose-Capillary: 86 mg/dL (ref 70–99)

## 2020-05-07 LAB — CBC
HCT: 29.8 % — ABNORMAL LOW (ref 39.0–52.0)
Hemoglobin: 9.4 g/dL — ABNORMAL LOW (ref 13.0–17.0)
MCH: 29.7 pg (ref 26.0–34.0)
MCHC: 31.5 g/dL (ref 30.0–36.0)
MCV: 94 fL (ref 80.0–100.0)
Platelets: 207 10*3/uL (ref 150–400)
RBC: 3.17 MIL/uL — ABNORMAL LOW (ref 4.22–5.81)
RDW: 16.8 % — ABNORMAL HIGH (ref 11.5–15.5)
WBC: 6.6 10*3/uL (ref 4.0–10.5)
nRBC: 0.8 % — ABNORMAL HIGH (ref 0.0–0.2)

## 2020-05-07 MED ORDER — GLUCERNA SHAKE PO LIQD
237.0000 mL | Freq: Three times a day (TID) | ORAL | Status: DC
  Administered 2020-05-07 – 2020-05-08 (×5): 237 mL via ORAL

## 2020-05-07 NOTE — H&P (Addendum)
Physical Medicine and Rehabilitation Admission H&P    Chief Complaint  Patient presents with  . Abdominal Pain    Constipation   : HPI: Danny CHRISTMAS is a 74 year old right-handed non-English-speaking male with history of myasthenia gravis on chronic prednisone as well as hypertension, BPH.  History taken from chart review and patient. Per chart review patient lives alone 1 level home with ramped entrance.  He has a Psychiatrist in the area.  Reportedly independent prior to admission.  He presented on 05/01/2020 with extreme constipation as well as abdominal pain.  No nausea vomiting.  In the ED he was hypotensive as well as tachycardic. He did receive a bolus of IV fluids.  Noted low-grade fever 100.1.  Underwent CT scan that revealed diverticulitis with retroperitoneal free air some of the pelvis.  No abscess or phlegmon noted.  Patient underwent sigmoid resection with Jeanette Caprice pouch as well as biopsy of 2 lymph nodes that showed no dysplasia or malignancy on 05/01/2020 per Dr.Tsuei. WOC consulted for ostomy care and education.  Hospital course complicated by respiratory failure requiring intubation.  He was extubated on 05/02/2020.  Neurology did follow-up for management of patient's myasthenia gravis.  Acute blood loss anemia 9.4 and monitored.  Patient's diet has been advanced to mechanical soft.  He was cleared to begin Lovenox for DVT prophylaxis.  Therapy evaluations completed with recommendations of inpatient rehab services.  Patient admitted due to debility. Please see preadmission assessment from earlier today.   Review of Systems  Constitutional: Positive for malaise/fatigue.  HENT: Negative for hearing loss.   Eyes: Negative for blurred vision and double vision.  Respiratory: Negative for cough and shortness of breath.   Cardiovascular: Negative for chest pain, palpitations and leg swelling.  Gastrointestinal: Negative for heartburn and nausea.  Genitourinary: Positive for  urgency. Negative for dysuria, flank pain and hematuria.  Musculoskeletal: Positive for back pain.       Recent fall approximately 1 week prior to admission.  Skin: Negative for rash.  Neurological: Positive for weakness. Negative for sensory change.  All other systems reviewed and are negative.  Past Medical History:  Diagnosis Date  . BPH (benign prostatic hyperplasia)   . DDD (degenerative disc disease), lumbosacral   . Fatty liver   . Foley catheter in place   . History of gallstones   . History of prostatitis   . Hypertension   . IDA (iron deficiency anemia)   . Liver mass 09/17/2001   4.7cm , noted on [place] abd  . Low back pain   . Myasthenia gravis ([place])   . Nocturia   . 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 Spondylosis Lumbar  . Umbilical hernia   . Urinary retention 02/08/2018  . Weakness of both legs 02/2020   Past Surgical History:  Procedure Laterality Date  . COLON RESECTION SIGMOID N/A 05/01/2020   Procedure: COLON RESECTION SIGMOID;  Surgeon: Donnie Mesa, MD;  Location: [facility];  Service: General;  Laterality: N/A;  . COLOSTOMY N/A 05/01/2020   Procedure: COLOSTOMY;  Surgeon: Donnie Mesa, MD;  Location: [facility];  Service: General;  Laterality: N/A;  . ERCP W/ SPHICTEROTOMY  09/17/2001  . INSERTION OF MESH N/A 02/05/2016   Procedure: INSERTION OF MESH, UMBILICAL HERNIA;  Surgeon: Armandina Gemma, MD;  Location: [place] [facility];  Service: General;  Laterality: N/A;  . LAPAROSCOPIC CHOLECYSTECTOMY  09/22/2001  . LAPAROTOMY N/A 05/01/2020   Procedure: EXPLORATORY  LAPAROTOMY;  Surgeon: Donnie Mesa, MD;  Location: [facility];  Service: General;  Laterality: N/A;  . TRANSURETHRAL RESECTION OF PROSTATE N/A 03/05/2018   Procedure: TRANSURETHRAL RESECTION OF THE PROSTATE (TURP);  Surgeon: Cleon Gustin, MD;  Location: [facility];  Service: Urology;  Laterality: N/A;  . UMBILICAL HERNIA REPAIR N/A 02/05/2016    Procedure: UMBILICAL HERNIA REPAIR WITH MESH PATCH;  Surgeon: Armandina Gemma, MD;  Location: [place];  Service: General;  Laterality: N/A;   Family History  Problem Relation Age of Onset  . Hypertension Mother   . Hypertension Father    Social History:  reports that he has never smoked. He has never used smokeless tobacco. He reports that he does not drink alcohol and does not use drugs. Allergies: No Known Allergies Medications Prior to Admission  Medication Sig Dispense Refill  . amLODipine (NORVASC) 5 MG tablet Take 1 tablet (5 mg total) by mouth daily. 30 tablet 11  . calcium-vitamin D (OSCAL 500/200 D-3) 500-200 MG-UNIT tablet Take 1 tablet by mouth 2 (two) times daily. 60 tablet 11  . cyanocobalamin (,VITAMIN B-12,) 1000 MCG/ML injection INYECTE 1ML EN EL MUSCULO UNA VEZ DURANTE 7 DIAS, [place] 1 ANO (Patient taking differently: Inject 1,000 mcg into the muscle every 30 (thirty) days.) 10 mL 1  . diclofenac Sodium (VOLTAREN) 1 % GEL Apply 2 g topically 4 (four) times daily. (Patient taking differently: Apply 2 g topically 4 (four) times daily as needed (pain).) 100 g 3  . ferrous sulfate 325 (65 FE) MG tablet Take 1 tablet (325 mg total) by mouth daily. 30 tablet 3  . finasteride (PROSCAR) 5 MG tablet Take 1 tablet (5 mg total) by mouth every morning. 90 tablet 3  . lisinopril (ZESTRIL) 40 MG tablet TOME UNA TABLETA TODOS LOS DIAS (Patient taking differently: Take 40 mg by mouth daily. TOME UNA TABLETA TODOS LOS DIAS) 90 tablet 3  . naproxen (NAPROSYN) 250 MG tablet TOME UNA TABLETA DOS VECES AL DIA CUANDO SEA NECESARIO PARA EL DOLOR (Patient taking differently: Take 250 mg by mouth daily as needed (pain).) 28 tablet 0  . omeprazole (PRILOSEC) 40 MG capsule TOME UNA CAPSULA TODOS LOS DIAS EN LA MANANA (Patient taking differently: Take 40 mg by mouth daily.) 90 capsule 3  . oxybutynin (DITROPAN) 5 MG tablet Take 5 mg by  mouth daily.    . polyvinyl alcohol (LIQUIFILM TEARS) 1.4 % ophthalmic solution Place 1 drop into both eyes as needed for dry eyes. 15 mL 0  . predniSONE (DELTASONE) 20 MG tablet Take 1 tablet (20 mg total) by mouth 3 (three) times daily with meals. (Patient taking differently: Take 20 mg by mouth 2 (two) times daily with a meal.) 90 tablet 2  . pyridostigmine (MESTINON) 60 MG tablet Take 0.5 tablets (30 mg total) by mouth in the morning AND 0.5 tablets (30 mg total) daily with lunch AND 0.5 tablets (30 mg total) at bedtime. 45 tablet 0  . NEEDLE, DISP, 25 G (BD ECLIPSE) 25G X 1-1/2" MISC Administer b12 injection daily for 7 days, then weekly for 4 weeks, then monthly for 1 year 10 each 1  . Syringe/Needle, Disp, (SYRINGE 3CC/21GX1") 21G X 1" 3 ML MISC Administer b12 daily for 7 days, then weekly for 4 weeks, then monthly for 1 year. 10 each 1    Drug Regimen Review Drug regimen was reviewed and remains appropriate with no significant issues identified  Home: Home Living Family/patient expects to be discharged to:: Private residence Living Arrangements: Alone Available Help at Discharge: Family,Available PRN/intermittently Type of Home: House Home Access: [place]: One level Bathroom Shower/Tub: Chiropodist: [place]: Shower seat Additional Comments: Does not use shower seat   Functional History: Prior Function Level of Independence: Independent Comments: Has been having difficulty with LE strength recently so not working at this time 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 Drives. Reports 1 fall recently in shower.  Functional Status:  Mobility: Bed Mobility Overal bed mobility: Needs Assistance Bed Mobility: Supine to Sit Rolling: Mod assist Sidelying to sit: Mod assist Supine to sit: Mod assist,HOB elevated Sit to sidelying: Mod assist General bed mobility comments: Heavy assist  for trunk advancement to EOB, use of bed rails and handheld assist from therapist Transfers Overall transfer level: Needs assistance Equipment used: Rolling walker (2 wheeled) Transfers: Sit to/from Stand Sit to Stand: Min assist,Mod assist General transfer comment: minA to power up, cues for hand placement. ModA for safety with stand to sit due to BLE fatigue x3 total reps Ambulation/Gait Ambulation/Gait assistance: Min guard Gait Distance (Feet): 100 Feet Assistive device: Rolling walker (2 wheeled) Gait Pattern/deviations: Shuffle,Step-through pattern,Decreased stride length,Trunk flexed General Gait Details: SpO2 88-94% on 1L O2 [place] during mobility, improves after seated rest break; HR 72-94 bpm; pt needs min cues for body orientation with RW and activity pacing, c/o BLE weakness but orthostatics stable per dinamap Gait velocity: decr Gait velocity interpretation: <1.31 ft/sec, indicative of household ambulator    ADL: ADL Overall ADL's : Needs assistance/impaired Eating/Feeding: Set up,Sitting Eating/Feeding Details (indicate cue type and reason): Setup for breakfast, able to open packaging Grooming: Set up,Sitting,Wash/dry hands Grooming Details (indicate cue type and reason): Setup to wash hands seated in recliner Upper Body Bathing: Minimal assistance,Sitting Lower Body Bathing: Moderate assistance,Sit to/from stand Upper Body Dressing : Set up,Sitting Lower Body Dressing: Moderate assistance,Sitting/lateral leans,Sit to/from stand Lower Body Dressing Details (indicate cue type and reason): Unable to reach feet seated in recliner (reports he sits in office chair that is adjustable or on EOB for tasks though reports EOB not low and pt reports need for lower surface to reach feet). May benefit from AE instruction. Toilet Transfer: Minimal Wellsite geologist Details (indicate cue type and reason): simulated with short steps to recliner Toileting- Clothing  Manipulation and Hygiene: Moderate assistance,Sit to/from stand Functional mobility during ADLs: Min guard,Rolling walker,Cueing for safety,Cueing for sequencing General ADL Comments: Pt with continued deficits in cardiopulmonary tolerance (improving O2 requirements but still desats), decreased strength  Cognition: Cognition Overall Cognitive Status: No family/caregiver present to determine baseline cognitive functioning Orientation Level: Oriented X4 Cognition Arousal/Alertness: Awake/alert Behavior During Therapy: WFL for tasks assessed/performed Overall Cognitive Status: No family/caregiver present to determine baseline cognitive functioning General Comments: Pt pleasantly participatory but needs cues for activity pacing and safety with mobility; Pt very motivated to go home alone but decreased awareness of physical deficits Difficult to assess due to: Non-English speaking  Physical Exam: Blood pressure 133/77, pulse 68, temperature 98.7 F (37.1 C), temperature source Axillary, resp. rate 18, height 5\' 7"  (1.702 m), weight 83 kg, SpO2 94 %. Physical Exam Vitals reviewed.  Constitutional:      Appearance: Normal appearance. He is obese.  HENT:     Head: Normocephalic and atraumatic.     Right Ear: External ear normal.     Left Ear: External  ear normal.     Nose: Nose normal.  Eyes:     General:        Right eye: No discharge.        Left eye: No discharge.     Extraocular Movements: Extraocular movements intact.  Cardiovascular:     Rate and Rhythm: Normal rate and regular rhythm.  Pulmonary:     Effort: Pulmonary effort is normal. No respiratory distress.     Breath sounds: No stridor.  Abdominal:     General: Abdomen is flat. Bowel sounds are normal. There is no distension.     Comments: Abdominal wound dressed and colostomy in place  Musculoskeletal:     Cervical back: Normal range of motion and neck supple.     Comments: No edema or tenderness in extremities  Skin:     General: Skin is warm and dry.  Neurological:     Mental Status: He is alert.     Comments: Alert Motor: 4+/5 throughout  Psychiatric:        Mood and Affect: Mood normal.        Behavior: Behavior normal.     Results for orders placed or performed during the hospital encounter of 05/01/20 (from the past 48 hour(s))  Glucose, capillary     Status: None   Collection Time: 05/06/20  8:27 AM  Result Value Ref Range   Glucose-Capillary 75 70 - 99 mg/dL    Comment: Glucose reference range applies only to samples taken after fasting for at least 8 hours.  Glucose, capillary     Status: Abnormal   Collection Time: 05/06/20 11:10 AM  Result Value Ref Range   Glucose-Capillary 123 (H) 70 - 99 mg/dL    Comment: Glucose reference range applies only to samples taken after fasting for at least 8 hours.  Glucose, capillary     Status: Abnormal   Collection Time: 05/06/20  5:00 PM  Result Value Ref Range   Glucose-Capillary 108 (H) 70 - 99 mg/dL    Comment: Glucose reference range applies only to samples taken after fasting for at least 8 hours.  Glucose, capillary     Status: Abnormal   Collection Time: 05/06/20  7:55 PM  Result Value Ref Range   Glucose-Capillary 161 (H) 70 - 99 mg/dL    Comment: Glucose reference range applies only to samples taken after fasting for at least 8 hours.  Glucose, capillary     Status: Abnormal   Collection Time: 05/06/20 11:50 PM  Result Value Ref Range   Glucose-Capillary 106 (H) 70 - 99 mg/dL    Comment: Glucose reference range applies only to samples taken after fasting for at least 8 hours.  CBC     Status: Abnormal   Collection Time: 05/07/20  3:40 AM  Result Value Ref Range   WBC 6.6 4.0 - 10.5 K/uL   RBC 3.17 (L) 4.22 - 5.81 MIL/uL   Hemoglobin 9.4 (L) 13.0 - 17.0 g/dL   HCT 29.8 (L) 39.0 - 52.0 %   MCV 94.0 80.0 - 100.0 fL   MCH 29.7 26.0 - 34.0 pg   MCHC 31.5 30.0 - 36.0 g/dL   RDW 16.8 (H) 11.5 - 15.5 %   Platelets 207 150 - 400 K/uL    nRBC 0.8 (H) 0.0 - 0.2 %    Comment: Performed at [facility] [address], [place], [place] [address]  Glucose, capillary     Status: None   Collection Time:  05/07/20  3:55 AM  Result Value Ref Range   Glucose-Capillary 84 70 - 99 mg/dL    Comment: Glucose reference range applies only to samples taken after fasting for at least 8 hours.  Glucose, capillary     Status: None   Collection Time: 05/07/20  8:08 AM  Result Value Ref Range   Glucose-Capillary 76 70 - 99 mg/dL    Comment: Glucose reference range applies only to samples taken after fasting for at least 8 hours.  Glucose, capillary     Status: None   Collection Time: 05/07/20 11:37 AM  Result Value Ref Range   Glucose-Capillary 86 70 - 99 mg/dL    Comment: Glucose reference range applies only to samples taken after fasting for at least 8 hours.  Glucose, capillary     Status: Abnormal   Collection Time: 05/07/20  4:01 PM  Result Value Ref Range   Glucose-Capillary 134 (H) 70 - 99 mg/dL    Comment: Glucose reference range applies only to samples taken after fasting for at least 8 hours.  Glucose, capillary     Status: Abnormal   Collection Time: 05/07/20  8:20 PM  Result Value Ref Range   Glucose-Capillary 102 (H) 70 - 99 mg/dL    Comment: Glucose reference range applies only to samples taken after fasting for at least 8 hours.  Glucose, capillary     Status: Abnormal   Collection Time: 05/07/20 11:45 PM  Result Value Ref Range   Glucose-Capillary 122 (H) 70 - 99 mg/dL    Comment: Glucose reference range applies only to samples taken after fasting for at least 8 hours.  Glucose, capillary     Status: None   Collection Time: 05/08/20  4:10 AM  Result Value Ref Range   Glucose-Capillary 98 70 - 99 mg/dL    Comment: Glucose reference range applies only to samples taken after fasting for at least 8 hours.   No results found.   Medical Problem List and Plan: 1.  Debility secondary to perforated diverticulitis  complicated by septic shock.  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  Wound care nurse follow-up for colostomy care and education  -patient may not shower  -ELOS/Goals: 4-6 days/mod I  Admit to CIR 2.  Antithrombotics: -DVT/anticoagulation: Lovenox  -antiplatelet therapy: N/A 3. Pain Management: Oxycodone as needed 4. Mood: Provide emotional support  -antipsychotic agents: N/A 5. Neuropsych: This patient is capable of making decisions on his own behalf. 6. Skin/Wound Care: Routine skin checks 7. Fluids/Electrolytes/Nutrition: Routine in and outs  CMP ordered. 8. Acute blood anemia  Hemoglobin 9.4 on 12/16, CBC ordered for tomorrow 9.  Hypertension.  Lisinopril 40 mg daily, Norvasc 5 mg daily.    Monitor increase mobility 10.  Myasthenia gravis.  Prednisone 20 mg twice daily and Mestinon 30 mg every 8 hours.  Follow neurology services for management 11.  BPH.  Ditropan 5 mg daily, Proscar 5 mg daily  Monitor for retention 12.  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  SSI  Monitor with increased mobility  Cathlyn Parsons, PA-C 05/08/2020  I have personally performed a face to face diagnostic evaluation, including, but not limited to relevant history and physical exam findings, of this patient and developed relevant assessment and plan.  Additionally, I have reviewed and concur with the physician assistant's documentation above.  Delice Lesch, MD, ABPMR

## 2020-05-07 NOTE — Consult Note (Signed)
[place] Nurse ostomy follow up Patient receiving care in [facility] 5N25. Pouch placed yesterday intact. Using remote Interpreter service agent Golden Pop, Operator [phone], I explained the Secure Start enrollment for for [facility] and the patient signed it. I also provided a marked [employer] catalog and showed him the 800 number and how to get a Spanish speaking attendant on the line.  Supplies ordered yesterday had been delivered.  The patient had not looked over the printed Spanish language education materials as he had agreed to yesterday.  I placed those within his reach and requested he look over the materials.  I also explained that it may be next Tuesday before a WOC can see him again.  The patient did not have any questions for me today. Patient enrolled in Secure Start.  Val Riles, RN, MSN, CWOCN, CNS-BC, pager [phone]

## 2020-05-07 NOTE — Progress Notes (Addendum)
Inpatient Rehabilitation Admissions Coordinator   I met with patient ar bedside utilizing interpretor Ivonne # [phone]. We discussed goals and expectations of a possible Cir admit. He is in agreement to admit if I can have him home by Christmas. He ask that I also speak to his step daughter, [name]., I contacted her by phone and she will notify me upon her arrival today so that we can further discuss plans. I do not have bed available today.  Danne Baxter, RN, MSN Rehab Admissions Coordinator [phone] 05/07/2020 12:21 PM   I met with patient and his stepdaughter at bedside and reviewed goals and expectations of a possible CIR admit. Both are in agreement pending bed availability.  Danne Baxter, RN, MSN Rehab Admissions Coordinator [phone] 05/07/2020 3:16 PM

## 2020-05-07 NOTE — Care Management Important Message (Signed)
Important Message  Patient Details  Name: Gor Vestal MRN: [id number] Date of Birth: 1945/12/09   Medicare Important Message Given:  Yes     Kesi Perrow P [place] 05/07/2020, 11:04 AM

## 2020-05-07 NOTE — Progress Notes (Signed)
NIF: > -40    VC: 1.3L with good pt effort

## 2020-05-07 NOTE — Progress Notes (Signed)
[facility] Progress Note  6 Days Post-Op  Subjective: CC-  No complaints this morning. Tolerating soft diet. Denies n/v. Colostomy functioning.  Requiring 1L O2 to keep sats up with mobility. CIR to meet with patient/family today. Patient is still not sure if he would want to go to CIR vs home. Daughter prefers that he go to CIR.  Objective: Vital signs in last 24 hours: Temp:  [97.9 F (36.6 C)-98.8 F (37.1 C)] 98.8 F (37.1 C) (12/16 0733) Pulse Rate:  [57-82] 59 (12/16 0733) Resp:  [16-18] 16 (12/16 0733) BP: (132-141)/(77-87) 135/77 (12/16 0733) SpO2:  [91 %-100 %] 100 % (12/16 0733) Last BM Date: 05/06/20  Intake/Output from previous day: 12/15 0701 - 12/16 0700 In: -  Out: 600 [Urine:600] Intake/Output this shift: No intake/output data recorded.  PE:  General: pleasant, NAD Heart: regular, rate, and rhythm.  Lungs: Respiratory effort nonlabored on 0.5L Abd: soft,appropriately ttp,mild distension, +BS, midline incision cdi with staples intact and no erythema or drainage, stoma viable with loose brown stool in pouch   Lab Results:  Recent Labs    05/05/20 0407 05/07/20 0340  WBC 6.8 6.6  HGB 7.6* 9.4*  HCT 24.4* 29.8*  PLT 140* 207   BMET Recent Labs    05/05/20 0407  NA 140  K 3.5  CL 108  CO2 24  GLUCOSE 105*  BUN 24*  CREATININE 1.04  CALCIUM 7.6*   PT/INR No results for input(s): LABPROT, INR in the last 72 hours. CMP     Component Value Date/Time   NA 140 05/05/2020 0407   NA 142 02/03/2020 1504   K 3.5 05/05/2020 0407   CL 108 05/05/2020 0407   CO2 24 05/05/2020 0407   GLUCOSE 105 (H) 05/05/2020 0407   BUN 24 (H) 05/05/2020 0407   BUN 31 (H) 02/03/2020 1504   CREATININE 1.04 05/05/2020 0407   CREATININE 0.77 01/01/2013 1045   CALCIUM 7.6 (L) 05/05/2020 0407   PROT 4.2 (L) 05/02/2020 0420   PROT 7.5 02/03/2020 1504   ALBUMIN 2.0 (L) 05/02/2020 0420   ALBUMIN 3.7 02/03/2020 1504   AST 79 (H) 05/02/2020 0420    ALT 123 (H) 05/02/2020 0420   ALKPHOS 60 05/02/2020 0420   BILITOT 0.7 05/02/2020 0420   BILITOT 0.2 02/03/2020 1504   GFRNONAA >60 05/05/2020 0407   GFRAA 84 02/03/2020 1504   Lipase     Component Value Date/Time   LIPASE 43 05/01/2020 0336       Studies/Results: MR THORACIC SPINE WO CONTRAST  Result Date: 05/05/2020 CLINICAL DATA:  Leg weakness and urinary retention. EXAM: MRI THORACIC AND LUMBAR SPINE WITHOUT CONTRAST TECHNIQUE: Multiplanar and multiecho pulse sequences of the thoracic and lumbar spine were obtained without intravenous contrast. COMPARISON:  None. FINDINGS: MRI THORACIC SPINE FINDINGS Alignment:  Physiologic. Vertebrae: No fracture, evidence of discitis, or bone lesion. Cord:  Normal signal and morphology. Paraspinal and other soft tissues: Small pleural effusions Disc levels: T7-8: Small right subarticular disc protrusion without stenosis. T9-10: Small central disc protrusion and annular fissure. No stenosis. MRI LUMBAR SPINE FINDINGS Segmentation:  Standard. Alignment:  Dextroscoliosis with apex at L3 Vertebrae:  No fracture, evidence of discitis, or bone lesion. Conus medullaris and cauda equina: Conus extends to the L1 level. Conus and cauda equina appear normal. Paraspinal and other soft tissues: Negative Disc levels: L1-L2: Left asymmetric disc bulge and left-greater-than-right facet hypertrophy. Left lateral recess narrowing without central spinal canal stenosis. No right, moderate left neural foraminal  stenosis. L2-L3: Small left asymmetric disc bulge. No spinal canal stenosis. No neural foraminal stenosis. L3-L4: Right asymmetric disc bulge and mild facet hypertrophy. Right lateral recess narrowing without central spinal canal stenosis. No right, but mild left neural foraminal stenosis. L4-L5: Severe facet hypertrophy with right asymmetric disc bulge. Right lateral recess narrowing without central spinal canal stenosis. Severe right and mild left neural foraminal  stenosis. L5-S1: Right asymmetric disc bulge. No spinal canal stenosis. Severe right and no left neural foraminal stenosis. Visualized sacrum: Normal. IMPRESSION: 1. No acute abnormality of the thoracic spine. No spinal canal or neural foraminal stenosis. 2. 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 3. 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 4. Multilevel lateral recess narrowing without central spinal canal stenosis. Electronically Signed   By: Ulyses Jarred M.D.   On: 05/05/2020 20:32   MR LUMBAR SPINE WO CONTRAST  Result Date: 05/05/2020 CLINICAL DATA:  Leg weakness and urinary retention. EXAM: MRI THORACIC AND LUMBAR SPINE WITHOUT CONTRAST TECHNIQUE: Multiplanar and multiecho pulse sequences of the thoracic and lumbar spine were obtained without intravenous contrast. COMPARISON:  None. FINDINGS: MRI THORACIC SPINE FINDINGS Alignment:  Physiologic. Vertebrae: No fracture, evidence of discitis, or bone lesion. Cord:  Normal signal and morphology. Paraspinal and other soft tissues: Small pleural effusions Disc levels: T7-8: Small right subarticular disc protrusion without stenosis. T9-10: Small central disc protrusion and annular fissure. No stenosis. MRI LUMBAR SPINE FINDINGS Segmentation:  Standard. Alignment:  Dextroscoliosis with apex at L3 Vertebrae:  No fracture, evidence of discitis, or bone lesion. Conus medullaris and cauda equina: Conus extends to the L1 level. Conus and cauda equina appear normal. Paraspinal and other soft tissues: Negative Disc levels: L1-L2: Left asymmetric disc bulge and left-greater-than-right facet hypertrophy. Left lateral recess narrowing without central spinal canal stenosis. No right, moderate left neural foraminal stenosis. L2-L3: Small left asymmetric disc bulge. No spinal canal stenosis. No neural foraminal stenosis. L3-L4: Right asymmetric disc bulge and mild facet hypertrophy. Right lateral recess narrowing without central spinal canal stenosis. No right,  but mild left neural foraminal stenosis. L4-L5: Severe facet hypertrophy with right asymmetric disc bulge. Right lateral recess narrowing without central spinal canal stenosis. Severe right and mild left neural foraminal stenosis. L5-S1: Right asymmetric disc bulge. No spinal canal stenosis. Severe right and no left neural foraminal stenosis. Visualized sacrum: Normal. IMPRESSION: 1. No acute abnormality of the thoracic spine. No spinal canal or neural foraminal stenosis. 2. 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 3. 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 4. Multilevel lateral recess narrowing without central spinal canal stenosis. Electronically Signed   By: Ulyses Jarred M.D.   On: 05/05/2020 20:32    Anti-infectives: Anti-infectives (From admission, onward)   Start     Dose/Rate Route Frequency Ordered Stop   05/01/20 1600  piperacillin-tazobactam (ZOSYN) IVPB 3.375 g        3.375 g 12.5 mL/hr over 240 Minutes Intravenous Every 8 hours 05/01/20 1508 05/06/20 2359   05/01/20 1015  metroNIDAZOLE (FLAGYL) IVPB 500 mg        500 mg 100 mL/hr over 60 Minutes Intravenous  Once 05/01/20 1008 05/01/20 1212   05/01/20 0845  cefTRIAXone (ROCEPHIN) 1 g in sodium chloride 0.9 % 100 mL IVPB  Status:  Discontinued        1 g 200 mL/hr over 30 Minutes Intravenous Every 24 hours 05/01/20 0834 05/01/20 1508       Assessment/Plan Myasthenia gravis - on prednisone 20mg  PO BID  ac and Mestinon 30mg  po q8hrs >> continue these at discharge. Follow up Dr. Posey Pronto. Neurology has signed off. MRI completed 12/14, I will let neurology know HTN - home meds  Septic shock secondary to perforated diverticulitis S/PEx-lap, 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 Georgette Dover - POD#6 - surgical path: Diverticulitis with evidence of perforation, Two benign lymph nodes, No dysplasia or malignancy - completed 5 days of zosyn - tolerating soft diet and colostomy functioning - mobilize, continue  therapies. CIR to meet with patient/family today. He is medically stable for discharge once dispo decided - WOC following  FEN: soft diet QAE:SLPNPYY FR:[id number]/30; IV Zosyn 12/10>>12/15    LOS: 6 days    Wellington Hampshire, [facility] Surgery 05/07/2020, 8:22 AM Please see Amion for pager number during day hours 7:00am-4:30pm

## 2020-05-08 ENCOUNTER — Encounter (HOSPITAL_COMMUNITY): Payer: Self-pay | Admitting: Physical Medicine & Rehabilitation

## 2020-05-08 ENCOUNTER — Inpatient Hospital Stay (HOSPITAL_COMMUNITY)
Admission: RE | Admit: 2020-05-08 | Discharge: 2020-05-19 | DRG: 945 | Disposition: A | Discharge status: Home Health | Payer: Medicare Other | Source: Intra-hospital | Attending: Physical Medicine & Rehabilitation | Admitting: Physical Medicine & Rehabilitation

## 2020-05-08 ENCOUNTER — Other Ambulatory Visit: Payer: Self-pay

## 2020-05-08 DIAGNOSIS — Z79899 Other long term (current) drug therapy: Secondary | ICD-10-CM | POA: Diagnosis not present

## 2020-05-08 DIAGNOSIS — M5137 Other intervertebral disc degeneration, lumbosacral region: Secondary | ICD-10-CM | POA: Diagnosis present

## 2020-05-08 DIAGNOSIS — D62 Acute posthemorrhagic anemia: Secondary | ICD-10-CM

## 2020-05-08 DIAGNOSIS — K219 Gastro-esophageal reflux disease without esophagitis: Secondary | ICD-10-CM

## 2020-05-08 DIAGNOSIS — Z8249 Family history of ischemic heart disease and other diseases of the circulatory system: Secondary | ICD-10-CM | POA: Diagnosis not present

## 2020-05-08 DIAGNOSIS — N400 Enlarged prostate without lower urinary tract symptoms: Secondary | ICD-10-CM | POA: Diagnosis present

## 2020-05-08 DIAGNOSIS — K5732 Diverticulitis of large intestine without perforation or abscess without bleeding: Secondary | ICD-10-CM | POA: Diagnosis present

## 2020-05-08 DIAGNOSIS — D509 Iron deficiency anemia, unspecified: Secondary | ICD-10-CM | POA: Diagnosis present

## 2020-05-08 DIAGNOSIS — T380X5A Adverse effect of glucocorticoids and synthetic analogues, initial encounter: Secondary | ICD-10-CM | POA: Diagnosis present

## 2020-05-08 DIAGNOSIS — I10 Essential (primary) hypertension: Secondary | ICD-10-CM | POA: Diagnosis present

## 2020-05-08 DIAGNOSIS — R739 Hyperglycemia, unspecified: Secondary | ICD-10-CM | POA: Diagnosis present

## 2020-05-08 DIAGNOSIS — R109 Unspecified abdominal pain: Secondary | ICD-10-CM

## 2020-05-08 DIAGNOSIS — Z933 Colostomy status: Secondary | ICD-10-CM

## 2020-05-08 DIAGNOSIS — G7001 Myasthenia gravis with (acute) exacerbation: Secondary | ICD-10-CM

## 2020-05-08 DIAGNOSIS — R338 Other retention of urine: Secondary | ICD-10-CM | POA: Diagnosis present

## 2020-05-08 DIAGNOSIS — R5381 Other malaise: Secondary | ICD-10-CM | POA: Diagnosis present

## 2020-05-08 DIAGNOSIS — G7000 Myasthenia gravis without (acute) exacerbation: Secondary | ICD-10-CM | POA: Diagnosis present

## 2020-05-08 DIAGNOSIS — Z7952 Long term (current) use of systemic steroids: Secondary | ICD-10-CM | POA: Diagnosis not present

## 2020-05-08 DIAGNOSIS — N401 Enlarged prostate with lower urinary tract symptoms: Secondary | ICD-10-CM | POA: Diagnosis present

## 2020-05-08 DIAGNOSIS — K760 Fatty (change of) liver, not elsewhere classified: Secondary | ICD-10-CM | POA: Diagnosis present

## 2020-05-08 LAB — GLUCOSE, CAPILLARY
Glucose-Capillary: 98 mg/dL (ref 70–99)
Glucose-Capillary: 84 mg/dL (ref 70–99)
Glucose-Capillary: 100 mg/dL — ABNORMAL HIGH (ref 70–99)
Glucose-Capillary: 172 mg/dL — ABNORMAL HIGH (ref 70–99)
Glucose-Capillary: 102 mg/dL — ABNORMAL HIGH (ref 70–99)

## 2020-05-08 MED ORDER — INSULIN ASPART 100 UNIT/ML ~~LOC~~ SOLN
0.0000 [IU] | SUBCUTANEOUS | Status: DC
  Administered 2020-05-09: 3 [IU] via SUBCUTANEOUS
  Administered 2020-05-09 – 2020-05-10 (×3): 4 [IU] via SUBCUTANEOUS
  Administered 2020-05-10 (×2): 3 [IU] via SUBCUTANEOUS
  Administered 2020-05-11: 4 [IU] via SUBCUTANEOUS
  Administered 2020-05-11 (×3): 3 [IU] via SUBCUTANEOUS
  Administered 2020-05-12: 7 [IU] via SUBCUTANEOUS
  Administered 2020-05-12 – 2020-05-13 (×2): 4 [IU] via SUBCUTANEOUS
  Administered 2020-05-13 – 2020-05-14 (×2): 3 [IU] via SUBCUTANEOUS
  Administered 2020-05-14: 4 [IU] via SUBCUTANEOUS
  Administered 2020-05-14: 3 [IU] via SUBCUTANEOUS
  Administered 2020-05-15: 4 [IU] via SUBCUTANEOUS
  Administered 2020-05-15: 3 [IU] via SUBCUTANEOUS
  Administered 2020-05-15: 11 [IU] via SUBCUTANEOUS
  Administered 2020-05-15 – 2020-05-16 (×2): 3 [IU] via SUBCUTANEOUS
  Administered 2020-05-16: 7 [IU] via SUBCUTANEOUS
  Administered 2020-05-16: 3 [IU] via SUBCUTANEOUS
  Administered 2020-05-16: 4 [IU] via SUBCUTANEOUS
  Administered 2020-05-17: 3 [IU] via SUBCUTANEOUS
  Administered 2020-05-17: 4 [IU] via SUBCUTANEOUS
  Administered 2020-05-17: 3 [IU] via SUBCUTANEOUS
  Administered 2020-05-18: 4 [IU] via SUBCUTANEOUS
  Administered 2020-05-18 (×3): 3 [IU] via SUBCUTANEOUS
  Administered 2020-05-19: 7 [IU] via SUBCUTANEOUS
  Administered 2020-05-19: 3 [IU] via SUBCUTANEOUS

## 2020-05-08 MED ORDER — GLUCERNA SHAKE PO LIQD
237.0000 mL | Freq: Three times a day (TID) | ORAL | Status: DC
  Administered 2020-05-08 – 2020-05-19 (×29): 237 mL via ORAL

## 2020-05-08 MED ORDER — OXYBUTYNIN CHLORIDE 5 MG PO TABS
5.0000 mg | ORAL_TABLET | Freq: Every day | ORAL | Status: DC
  Administered 2020-05-09 – 2020-05-19 (×11): 5 mg via ORAL
  Filled 2020-05-08 (×11): qty 1

## 2020-05-08 MED ORDER — NAPHAZOLINE-GLYCERIN 0.012-0.2 % OP SOLN
1.0000 [drp] | Freq: Four times a day (QID) | OPHTHALMIC | Status: DC | PRN
  Filled 2020-05-08: qty 15

## 2020-05-08 MED ORDER — ONDANSETRON 4 MG PO TBDP
4.0000 mg | ORAL_TABLET | Freq: Four times a day (QID) | ORAL | Status: DC | PRN
Start: 2020-05-08 — End: 2020-05-19

## 2020-05-08 MED ORDER — PANTOPRAZOLE SODIUM 40 MG PO TBEC
40.0000 mg | DELAYED_RELEASE_TABLET | Freq: Every day | ORAL | Status: DC
  Administered 2020-05-09 – 2020-05-10 (×2): 40 mg via ORAL
  Filled 2020-05-08 (×3): qty 1

## 2020-05-08 MED ORDER — LISINOPRIL 20 MG PO TABS
40.0000 mg | ORAL_TABLET | Freq: Every day | ORAL | Status: DC
  Administered 2020-05-09 – 2020-05-19 (×11): 40 mg via ORAL
  Filled 2020-05-08 (×11): qty 2

## 2020-05-08 MED ORDER — POLYVINYL ALCOHOL 1.4 % OP SOLN
1.0000 [drp] | OPHTHALMIC | Status: DC | PRN
  Filled 2020-05-08: qty 15

## 2020-05-08 MED ORDER — ACETAMINOPHEN 325 MG PO TABS: 650.0000 mg | ORAL_TABLET | Freq: Four times a day (QID) | ORAL | Status: DC | PRN

## 2020-05-08 MED ORDER — ENOXAPARIN SODIUM 40 MG/0.4ML ~~LOC~~ SOLN
40.0000 mg | SUBCUTANEOUS | Status: DC
  Administered 2020-05-09 – 2020-05-19 (×11): 40 mg via SUBCUTANEOUS
  Filled 2020-05-08 (×11): qty 0.4

## 2020-05-08 MED ORDER — PREDNISONE 20 MG PO TABS
20.0000 mg | ORAL_TABLET | Freq: Two times a day (BID) | ORAL | Status: DC
  Administered 2020-05-09 – 2020-05-19 (×21): 20 mg via ORAL
  Filled 2020-05-08 (×22): qty 1

## 2020-05-08 MED ORDER — ENOXAPARIN SODIUM 40 MG/0.4ML ~~LOC~~ SOLN: 40.0000 mg | SUBCUTANEOUS | Status: DC

## 2020-05-08 MED ORDER — OXYCODONE HCL 5 MG PO TABS: 5.0000 mg | ORAL_TABLET | ORAL | Status: DC | PRN

## 2020-05-08 MED ORDER — PYRIDOSTIGMINE BROMIDE 60 MG PO TABS
30.0000 mg | ORAL_TABLET | Freq: Three times a day (TID) | ORAL | Status: DC
  Administered 2020-05-08 – 2020-05-19 (×32): 30 mg via ORAL
  Filled 2020-05-08 (×33): qty 0.5

## 2020-05-08 MED ORDER — ONDANSETRON HCL 4 MG/2ML IJ SOLN: 4.0000 mg | Freq: Four times a day (QID) | INTRAMUSCULAR | Status: DC | PRN

## 2020-05-08 MED ORDER — AMLODIPINE BESYLATE 5 MG PO TABS
5.0000 mg | ORAL_TABLET | Freq: Every day | ORAL | Status: DC
  Administered 2020-05-09 – 2020-05-19 (×11): 5 mg via ORAL
  Filled 2020-05-08 (×11): qty 1

## 2020-05-08 MED ORDER — FINASTERIDE 5 MG PO TABS
5.0000 mg | ORAL_TABLET | Freq: Every day | ORAL | Status: DC
  Administered 2020-05-09 – 2020-05-19 (×11): 5 mg via ORAL
  Filled 2020-05-08 (×11): qty 1

## 2020-05-08 NOTE — Progress Notes (Signed)
[facility] Progress Note  7 Days Post-Op  Subjective: CC-  Up in chair. About to eat breakfast. No abdominal complaints today. Tolerating diet and colostomy functioning.  Planning to go to CIR.  Objective: Vital signs in last 24 hours: Temp:  [98.4 F (36.9 C)-98.7 F (37.1 C)] 98.5 F (36.9 C) (12/17 0808) Pulse Rate:  [64-73] 64 (12/17 0808) Resp:  [16-18] 16 (12/17 0808) BP: (133-156)/(74-89) 146/89 (12/17 0808) SpO2:  [91 %-97 %] 97 % (12/17 0808) Last BM Date: 05/06/20  Intake/Output from previous day: 12/16 0701 - 12/17 0700 In: 480 [P.O.:480] Out: 1950 [Urine:1700; Stool:250] Intake/Output this shift: No intake/output data recorded.  PE: General: pleasant, NAD Heart: regular, rate, and rhythm.  Lungs: Respiratory effort nonlaboredon room air Abd: soft,appropriately ttp,mild distension, +BS, midline incisioncdi with staples intact and no erythema or drainage, stoma viable withloose brown stool in pouch    Lab Results:  Recent Labs    05/07/20 0340  WBC 6.6  HGB 9.4*  HCT 29.8*  PLT 207   BMET No results for input(s): NA, K, CL, CO2, GLUCOSE, BUN, CREATININE, CALCIUM in the last 72 hours. PT/INR No results for input(s): LABPROT, INR in the last 72 hours. CMP     Component Value Date/Time   NA 140 05/05/2020 0407   NA 142 02/03/2020 1504   K 3.5 05/05/2020 0407   CL 108 05/05/2020 0407   CO2 24 05/05/2020 0407   GLUCOSE 105 (H) 05/05/2020 0407   BUN 24 (H) 05/05/2020 0407   BUN 31 (H) 02/03/2020 1504   CREATININE 1.04 05/05/2020 0407   CREATININE 0.77 01/01/2013 1045   CALCIUM 7.6 (L) 05/05/2020 0407   PROT 4.2 (L) 05/02/2020 0420   PROT 7.5 02/03/2020 1504   ALBUMIN 2.0 (L) 05/02/2020 0420   ALBUMIN 3.7 02/03/2020 1504   AST 79 (H) 05/02/2020 0420   ALT 123 (H) 05/02/2020 0420   ALKPHOS 60 05/02/2020 0420   BILITOT 0.7 05/02/2020 0420   BILITOT 0.2 02/03/2020 1504   GFRNONAA >60 05/05/2020 0407   GFRAA 84 02/03/2020 1504    Lipase     Component Value Date/Time   LIPASE 43 05/01/2020 0336       Studies/Results: No results found.  Anti-infectives: Anti-infectives (From admission, onward)   Start     Dose/Rate Route Frequency Ordered Stop   05/01/20 1600  piperacillin-tazobactam (ZOSYN) IVPB 3.375 g        3.375 g 12.5 mL/hr over 240 Minutes Intravenous Every 8 hours 05/01/20 1508 05/06/20 2359   05/01/20 1015  metroNIDAZOLE (FLAGYL) IVPB 500 mg        500 mg 100 mL/hr over 60 Minutes Intravenous  Once 05/01/20 1008 05/01/20 1212   05/01/20 0845  cefTRIAXone (ROCEPHIN) 1 g in sodium chloride 0.9 % 100 mL IVPB  Status:  Discontinued        1 g 200 mL/hr over 30 Minutes Intravenous Every 24 hours 05/01/20 0834 05/01/20 1508       Assessment/Plan Myasthenia gravis -on prednisone 20mg  PO BID ac and Mestinon 30mg  po q8hrs >> continue these at discharge. Follow up Dr. Posey Pronto. Neurology has signed off. MRI completed 12/14, neurology team is aware HTN - home meds  Septic shock secondary to perforated diverticulitis S/PEx-lap, 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 Georgette Dover - POD#7 - surgical path:Diverticulitis with evidence of perforation,Two benign lymph nodes,No dysplasia or malignancy - completed 5 days of zosyn - tolerating soft diet and colostomy functioning - WOC following  FEN: soft diet ZNB:VAPOLID CV:[id number]/[id number]/20; IV Zosyn 12/10>>12/15    Plan: Stable for discharge to CIR when bed available. He needs his abdominal staples removed around POD#12-14, he likely will still be in CIR at that time. We will arrange follow up in our office after discharge from CIR.   LOS: 7 days    Wellington Hampshire, [facility] Surgery 05/08/2020, 8:33 AM Please see Amion for pager number during day hours 7:00am-4:30pm

## 2020-05-08 NOTE — Discharge Summary (Signed)
[facility] Discharge Summary   Patient ID: Danny Walker MRN: [id number] DOB/Walker: June 16, 1945 74 y.o.  Admit date: 05/01/2020 Discharge date: 05/08/2020  Admitting Diagnosis: Septic shock secondary to perforated diverticulitis  Discharge Diagnosis Patient Active Problem List   Diagnosis Date Noted  . Diverticulitis 05/01/2020  . Endotracheally intubated   . Perforation of sigmoid colon due to diverticulitis   . Encephalopathy acute   . Peritonitis ([place])   . Leg weakness 03/23/2020  . Visual disturbance 03/23/2020  . Normocytic anemia 03/10/2020  . Myasthenia gravis ([place]) 03/02/2020  . Myasthenia gravis with acute exacerbation ([place]) 01/24/2020  . Hip pain 09/30/2019  . Right shoulder pain 08/20/2019  . Health care maintenance 12/25/2018  . Gastroesophageal reflux disease 04/18/2018  . Essential hypertension 04/18/2018  . Protein-calorie malnutrition, severe 04/06/2018  . 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 Bulbar weakness ([place]) 04/03/2018  . Unintentional weight loss 04/03/2018  . BPH (benign prostatic hyperplasia) 03/05/2018  . Umbilical hernia 46/28/6381    Consultants Neurology  Imaging: No results found.  Procedures Dr. Georgette Dover (05/01/2020) - Exploratory laparotomy, sigmoidectomy, end colostomy  Hospital Course:  Danny Walker is a 74yo male PMH myasthenia gravis on chronic prednisone who presented to [facility] 12/10 with abdominal pain.  Workup showed Septic shock secondary to perforated diverticulitis.  Patient was admitted and taken to the operating room 12/10 for Exploratory laparotomy, sigmoidectomy, end colostomy.  Tolerated procedure well and was transferred to the floor.  He completed 5 days of IV zosyn. Once bowel function returned his diet was advanced as tolerated.  Neurology was consulted for assistance with management of his myasthenia gravis. Patient worked with therapies during this admission who recommended inpatient rehab  when medically stable for discharge. On POD7, the patient was voiding well, tolerating diet, having bowel function, working well with therapies, pain well controlled, vital signs stable, incisions c/d/i and felt stable for discharge to inpatient rehab.  He needs to have his staples removed around POD#12-14, likely while still in inpatient rehab. Patient will follow up as below and knows to call with questions or concerns.        Follow-up Information    Donnie Mesa, MD. Go on 06/05/2020.   Specialty: General Surgery Why: Your appointment is 06/05/20 @ 11:20am Please arrive 30 minutes prior to your appointment to check in and fill out paperwork. Bring photo ID and insurance information. Contact information: [address] [place] [address] [phone]               Signed: Wellington Hampshire, [facility] Surgery 05/08/2020, 10:36 AM Please see Amion for pager number during day hours 7:00am-4:30pm

## 2020-05-08 NOTE — PMR Pre-admission (Signed)
PMR Admission Coordinator Pre-Admission Assessment  Patient: Danny Walker is an 74 y.o., male MRN: [id number] DOB: 30-Jul-1945 Height: '5\' 7"'  (170.2 cm) Weight: 83 kg  Insurance Information HMO:     PPO:      PCP:      IPA:      80/20:      OTHER:  PRIMARY: Medicare a and b      Policy#: [id number]      Subscriber: pt Benefits:  Phone #: online     Name: 12/16 Eff. Date: 02/20/2017     Deduct: $1484      Out of Pocket Max: none      Life Max: none CIR: 100%      SNF: 20 full days Outpatient: 80%     Co-Pay: 20% Home Health: 100%      Co-Pay: none DME: 80%     Co-Pay: 20% Providers: pt choice  SECONDARY: Medicaid of [place]      Policy#: [id number] r 09/32 [place]  Financial Counselor:       Phone#:   The "Data Collection Information Summary" for patients in Inpatient Rehabilitation Facilities with attached "Privacy Act [place] Records" was provided and verbally reviewed with: Patient  Emergency Contact Information Contact Information    Name Relation Home Work Mobile   [place] Daughter   [phone]      Current Medical History  Patient Admitting Diagnosis: Debility   History of Present Illness: 74 year old right-handed non-English-speaking male with history of myasthenia gravis on chronic prednisone as well as hypertension, BPH.    He has a Psychiatrist in the area.  Reportedly independent prior to admission.  Presented 05/01/2020 with extreme constipation as well as abdominal pain.  No nausea vomiting.  In the ED he was hypotensive as well as tachycardic.  He did receive a bolus of IV fluids.  Noted low-grade fever 100.1.  Underwent CT scan that revealed diverticulitis with retroperitoneal free air some of the pelvis.  No abscess or phlegmon noted.  Patient underwent sigmoid resection with Jeanette Caprice pouch as well as biopsy of 2 lymph nodes that showed no dysplasia or malignancy 05/01/2020 per Dr.Tsuei. WOC consulted for ostomy care and education.  Hospital course  complicated by respiratory failure requiring intubation.  He was extubated 05/02/2020.  Neurology did follow-up for management of patient's myasthenia gravis.  Acute blood loss anemia monitored.  Patient's diet has been advanced to mechanical soft.  He was cleared to begin Lovenox for DVT prophylaxis.     Patient's medical record from [facility]  has been reviewed by the rehabilitation admission coordinator and physician.  Past Medical History  Past Medical History:  Diagnosis Date  . BPH (benign prostatic hyperplasia)   . DDD (degenerative disc disease), lumbosacral   . Fatty liver   . Foley catheter in place   . History of gallstones   . History of prostatitis   . Hypertension   . IDA (iron deficiency anemia)   . Liver mass 09/17/2001   4.7cm , noted on [place] abd  . Low back pain   . Myasthenia gravis ([place])   . Nocturia   . 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 Spondylosis Lumbar  . Umbilical hernia   . Urinary retention 02/08/2018  . Weakness of both legs 02/2020    Family History   family history includes Hypertension in his father and mother.  Prior Rehab/Hospitalizations Has the patient had prior rehab or hospitalizations  prior to admission? Yes  Has the patient had major surgery during 100 days prior to admission? Yes   Current Medications  Current Facility-Administered Medications:  .  0.9 %  sodium chloride infusion, , Intravenous, PRN, Rigoberto Noel, MD, Stopped at 05/04/20 1311 .  acetaminophen (TYLENOL) tablet 650 mg, 650 mg, Oral, Q6H PRN, Norm Parcel, PA-C .  amLODipine (NORVASC) tablet 5 mg, 5 mg, Oral, Daily, Norm Parcel, PA-C, 5 mg at 05/08/20 0856 .  chlorhexidine gluconate (MEDLINE KIT) (PERIDEX) 0.12 % solution 15 mL, 15 mL, Mouth Rinse, BID, Rigoberto Noel, MD, 15 mL at 05/07/20 2119 .  enoxaparin (LOVENOX) injection 40 mg, 40 mg, Subcutaneous, Q24H, Saverio Danker, PA-C, 40 mg at 05/07/20 1302 .  feeding  supplement (GLUCERNA SHAKE) (GLUCERNA SHAKE) liquid 237 mL, 237 mL, Oral, TID BM, Meuth, Brooke A, PA-C, 237 mL at 05/08/20 0858 .  finasteride (PROSCAR) tablet 5 mg, 5 mg, Oral, Daily, Norm Parcel, PA-C, 5 mg at 05/08/20 0856 .  insulin aspart (novoLOG) injection 0-20 Units, 0-20 Units, Subcutaneous, Q4H, Rigoberto Noel, MD, 3 Units at 05/07/20 2348 .  lisinopril (ZESTRIL) tablet 40 mg, 40 mg, Oral, Daily, Norm Parcel, PA-C, 40 mg at 05/08/20 0856 .  morphine 2 MG/ML injection 2 mg, 2 mg, Intravenous, Q4H PRN, Meuth, Brooke A, PA-C .  naphazoline-glycerin (CLEAR EYES REDNESS) ophth solution 1-2 drop, 1-2 drop, Both Eyes, QID PRN, Greta Doom, MD .  ondansetron [facility]) injection 4 mg, 4 mg, Intravenous, Q4H PRN, Saverio Danker, PA-C .  ondansetron (ZOFRAN-ODT) disintegrating tablet 4 mg, 4 mg, Oral, Q6H PRN **OR** ondansetron (ZOFRAN) injection 4 mg, 4 mg, Intravenous, Q6H PRN, Saverio Danker, PA-C .  oxybutynin (DITROPAN) tablet 5 mg, 5 mg, Oral, Daily, Norm Parcel, PA-C, 5 mg at 05/08/20 0856 .  oxyCODONE (Oxy IR/ROXICODONE) immediate release tablet 5-10 mg, 5-10 mg, Oral, Q4H PRN, Meuth, Brooke A, PA-C, 10 mg at 05/06/20 1208 .  pantoprazole (PROTONIX) EC tablet 40 mg, 40 mg, Oral, Daily, Norm Parcel, PA-C, 40 mg at 05/08/20 0856 .  polyvinyl alcohol (LIQUIFILM TEARS) 1.4 % ophthalmic solution 1 drop, 1 drop, Both Eyes, PRN, Johnson, [employer], PA-C .  predniSONE (DELTASONE) tablet 20 mg, 20 mg, Oral, BID WC, Kirby-Graham, Karsten Fells, NP, 20 mg at 05/08/20 0856 .  pyridostigmine (MESTINON) tablet 30 mg, 30 mg, Oral, Q8H, Kirby-Graham, Karsten Fells, NP, 30 mg at 05/08/20 0548  Patients Current Diet:  Diet Order            DIET SOFT Room service appropriate? Yes; Fluid consistency: Thin  Diet effective now                 Precautions / Restrictions Precautions Precautions: Fall,Other (comment) Precaution Comments: watch 02, colostomy Restrictions Weight Bearing  Restrictions: No   Has the patient had 2 or more falls or a fall with injury in the past year? No  Prior Activity Level Limited Community (1-2x/wk): working at [employer] and driving until August when havin gissues with his MG; decline in function since then  Prior Functional Level Self Care: Did the patient need help bathing, dressing, using the toilet or eating? Independent  Indoor Mobility: Did the patient need assistance with walking from room to room (with or without device)? Independent  Stairs: Did the patient need assistance with internal or external stairs (with or without device)? Independent  Functional Cognition: Did the patient need help planning regular tasks such as  shopping or remembering to take medications? [place] / [place] Devices/Equipment: Cane (specify quad or straight) Home Equipment: Shower seat  Prior Device Use: Indicate devices/aids used by the patient prior to current illness, exacerbation or injury? None of the above  Current Functional Level Cognition  Overall Cognitive Status: No family/caregiver present to determine baseline cognitive functioning Difficult to assess due to: Non-English speaking Orientation Level: Oriented X4 General Comments: Pt pleasantly participatory but needs cues for activity pacing and safety with mobility; Pt very motivated to go home alone but decreased awareness of physical deficits    Extremity Assessment (includes Sensation/Coordination)  Upper Extremity Assessment: Generalized weakness  Lower Extremity Assessment: Defer to PT evaluation    ADLs  Overall ADL's : Needs assistance/impaired Eating/Feeding: Set up,Sitting Eating/Feeding Details (indicate cue type and reason): Setup for breakfast, able to open packaging Grooming: Set up,Sitting,Wash/dry hands Grooming Details (indicate cue type and reason): Setup to wash hands seated in recliner Upper Body Bathing: Minimal  assistance,Sitting Lower Body Bathing: Moderate assistance,Sit to/from stand Upper Body Dressing : Set up,Sitting Lower Body Dressing: Moderate assistance,Sitting/lateral leans,Sit to/from stand Lower Body Dressing Details (indicate cue type and reason): Unable to reach feet seated in recliner (reports he sits in office chair that is adjustable or on EOB for tasks though reports EOB not low and pt reports need for lower surface to reach feet). May benefit from AE instruction. Toilet Transfer: Minimal Wellsite geologist Details (indicate cue type and reason): simulated with short steps to recliner Toileting- Clothing Manipulation and Hygiene: Moderate assistance,Sit to/from stand Functional mobility during ADLs: Min guard,Rolling walker,Cueing for safety,Cueing for sequencing General ADL Comments: Pt with continued deficits in cardiopulmonary tolerance (improving O2 requirements but still desats), decreased strength    Mobility  Overal bed mobility: Needs Assistance Bed Mobility: Supine to Sit Rolling: Mod assist Sidelying to sit: Mod assist Supine to sit: Mod assist,HOB elevated Sit to sidelying: Mod assist General bed mobility comments: Heavy assist for trunk advancement to EOB, use of bed rails and handheld assist from therapist    Transfers  Overall transfer level: Needs assistance Equipment used: Rolling walker (2 wheeled) Transfers: Sit to/from Stand Sit to Stand: Min assist,Mod assist General transfer comment: minA to power up, cues for hand placement. ModA for safety with stand to sit due to BLE fatigue x3 total reps    Ambulation / Gait / Stairs / Wheelchair Mobility  Ambulation/Gait Ambulation/Gait assistance: Counsellor (Feet): 100 Feet Assistive device: Rolling walker (2 wheeled) Gait Pattern/deviations: Shuffle,Step-through pattern,Decreased stride length,Trunk flexed General Gait Details: SpO2 88-94% on 1L O2 [place] during mobility,  improves after seated rest break; HR 72-94 bpm; pt needs min cues for body orientation with RW and activity pacing, c/o BLE weakness but orthostatics stable per dinamap Gait velocity: decr Gait velocity interpretation: <1.31 ft/sec, indicative of household ambulator    Posture / Balance Dynamic Sitting Balance Sitting balance - Comments: initially requiring min assist for posterior truncal assist, correcting to supervision for safety with prolonged sitting Balance Overall balance assessment: History of Falls,Needs assistance Sitting-balance support: Feet supported,Bilateral upper extremity supported Sitting balance-Leahy Scale: Fair Sitting balance - Comments: initially requiring min assist for posterior truncal assist, correcting to supervision for safety with prolonged sitting Postural control: Posterior lean Standing balance support: During functional activity,Bilateral upper extremity supported Standing balance-Leahy Scale: Poor Standing balance comment: reliant on UE support in standing    Special needs/care consideration Spanish Interpreting needed 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  with [place] RN working with him   Previous Environmental health practitioner  Living Arrangements: Alone  Lives With: Alone Available Help at Discharge: Family,Available PRN/intermittently Type of Home: House Home Layout: One level Home Access: Ramped entrance Bathroom Shower/Tub: Chiropodist: [place] Accessibility: Yes How Accessible: Accessible via walker [place]: No Additional Comments: Does not use shower seat  Discharge Living Setting Plans for Discharge Living Setting: Patient's home,Alone Type of Home at Discharge: House Discharge Home Layout: One level Discharge Home Access: [place] entrance Discharge Bathroom Shower/Tub: [place] unit Discharge Bathroom Toilet: Standard Discharge [place] Accessibility: Yes How Accessible: Accessible via walker Does the patient have any  problems obtaining your medications?: No  Social/Family/Support Systems Contact Information: stepdaughter, [name] Anticipated Caregiver: [name] intermitent only Anticipated Caregiver's Contact Information: see above Ability/Limitations of Caregiver: intermittent only; [name] is Personnel officer Availability: Intermittent Discharge Plan Discussed with Primary Caregiver: Yes Is Caregiver In Agreement with Plan?: Yes Does Caregiver/Family have Issues with Lodging/Transportation while Pt is in Rehab?: No  Goals Patient/Family Goal for Rehab: Mod I with PT and OT Expected length of stay: ELOS 5 to 7 days Cultural Considerations: Spanish speaker Additional Information: He wants to be home by Christmas Eve Pt/Family Agrees to Admission and willing to participate: Yes Program Orientation Provided & Reviewed with Pt/Caregiver Including Roles  & Responsibilities: Yes  Decrease burden of Care through IP rehab admission: n/a  Possible need for SNF placement upon discharge: not anticipated  Patient Condition: I have reviewed medical records from [facility] , spoken with CM, and patient and daughter. I met with patient at the bedside for inpatient rehabilitation assessment.  Patient will benefit from ongoing PT and OT, can actively participate in 3 hours of therapy a day 5 days of the week, and can make measurable gains during the admission.  Patient will also benefit from the coordinated team approach during an Inpatient Acute Rehabilitation admission.  The patient will receive intensive therapy as well as Rehabilitation physician, nursing, social worker, and care management interventions.  Due to bladder management, bowel management, safety, skin/wound care, disease management, medication administration, pain management and patient education the patient requires 24 hour a day rehabilitation nursing.  The patient is currently min to mod assist overall with mobility and basic ADLs.  Discharge  setting and therapy post discharge at home with home health is anticipated.  Patient has agreed to participate in the Acute Inpatient Rehabilitation Program and will admit today.  Preadmission Screen Completed By:  Cleatrice Burke, 05/08/2020 11:01 AM ______________________________________________________________________   Discussed status with Dr. Posey Pronto  on  05/08/2020  at 1108 and received approval for admission today.  Admission Coordinator:  Cleatrice Burke, RN, time  7793 Date 05/08/2020   Assessment/Plan: Diagnosis: Debility   1. Does the need for close, 24 hr/day Medical supervision in concert with the patient's rehab needs make it unreasonable for this patient to be served in a less intensive setting? Potentially  2. Co-Morbidities requiring supervision/potential complications: myasthenia gravis on chronic prednisone as well as hypertension, BPH, Acute blood loss anemia  3. Due to bladder management, safety, skin/wound care, disease management, pain management and patient education, does the patient require 24 hr/day rehab nursing? Potentially 4. Does the patient require coordinated care of a physician, rehab nurse, PT, OT to address physical and functional deficits in the context of the above medical diagnosis(es)? Potentially Addressing deficits in the following areas: balance, endurance, locomotion, strength, transferring, bathing, dressing, toileting and psychosocial support  5. Can the patient actively participate in an intensive therapy program of at least 3 hrs of therapy 5 days a week? Yes 6. The potential for patient to make measurable gains while on inpatient rehab is good 7. Anticipated functional outcomes upon discharge from inpatient rehab: modified independent PT, modified independent OT, n/a SLP 8. Estimated rehab length of stay to reach the above functional goals is: 3-6 days. 9. Anticipated discharge destination: Home 10. Overall Rehab/Functional  Prognosis: excellent   MD Signature: Delice Lesch, MD, ABPMR

## 2020-05-08 NOTE — Progress Notes (Signed)
NIF: >-40 VC: 1.3L

## 2020-05-08 NOTE — Progress Notes (Signed)
Jamse Arn, MD  Physician  Physical Medicine and Rehabilitation  PMR Pre-admission     Signed  Date of Service:  05/08/2020 11:01 AM      Related encounter: ED to Hosp-Admission (Current) from 05/01/2020 in [place]          Show:Clear all '[x]' Manual'[x]' Template'[x]' Copied  Added by: '[x]' Cristina Gong, RN'[x]' Jamse Arn, MD   '[]' Hover for details  PMR Admission Coordinator Pre-Admission Assessment   Patient: Danny Walker is an 74 y.o., male MRN: [id number] DOB: 1946/05/02 Height: '5\' 7"'  (170.2 cm) Weight: 83 kg   Insurance Information HMO:     PPO:      PCP:      IPA:      80/20:      OTHER:  PRIMARY: Medicare a and b      Policy#: [id number]      Subscriber: pt Benefits:  Phone #: online     Name: 12/16 Eff. Date: 02/20/2017     Deduct: $1484      Out of Pocket Max: none      Life Max: none CIR: 100%      SNF: 20 full days Outpatient: 80%     Co-Pay: 20% Home Health: 100%      Co-Pay: none DME: 80%     Co-Pay: 20% Providers: pt choice  SECONDARY: Medicaid of [place]      Policy#: [id number] r 96/22 [place]   Financial Counselor:       Phone#:    The "Data Collection Information Summary" for patients in Inpatient Rehabilitation Facilities with attached "Privacy Act [place] Records" was provided and verbally reviewed with: Patient   Emergency Contact Information         Contact Information     Name Relation Home Work Mobile    Pevely Daughter     [phone]         Current Medical History  Patient Admitting Diagnosis: Debility    History of Present Illness: 74 year old right-handed non-English-speaking male with history of myasthenia gravis on chronic prednisone as well as hypertension, BPH.    He has a Psychiatrist in the area.  Reportedly independent prior to admission.  Presented 05/01/2020 with extreme constipation as well as abdominal pain.  No nausea vomiting.  In the  ED he was hypotensive as well as tachycardic.  He did receive a bolus of IV fluids.  Noted low-grade fever 100.1.  Underwent CT scan that revealed diverticulitis with retroperitoneal free air some of the pelvis.  No abscess or phlegmon noted.  Patient underwent sigmoid resection with Jeanette Caprice pouch as well as biopsy of 2 lymph nodes that showed no dysplasia or malignancy 05/01/2020 per Dr.Tsuei. WOC consulted for ostomy care and education.  Hospital course complicated by respiratory failure requiring intubation.  He was extubated 05/02/2020.  Neurology did follow-up for management of patient's myasthenia gravis.  Acute blood loss anemia monitored.  Patient's diet has been advanced to mechanical soft.  He was cleared to begin Lovenox for DVT prophylaxis.    Patient's medical record from [facility]  has been reviewed by the rehabilitation admission coordinator and physician.   Past Medical History      Past Medical History:  Diagnosis Date  . BPH (benign prostatic hyperplasia)    . DDD (degenerative disc disease), lumbosacral    . Fatty liver    . Foley catheter in place    . History  of gallstones    . History of prostatitis    . Hypertension    . IDA (iron deficiency anemia)    . Liver mass 09/17/2001    4.7cm , noted on [place] abd  . Low back pain    . Myasthenia gravis ([place])    . Nocturia    . 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 Spondylosis Lumbar  . Umbilical hernia    . Urinary retention 02/08/2018  . Weakness of both legs 02/2020      Family History   family history includes Hypertension in his father and mother.   Prior Rehab/Hospitalizations Has the patient had prior rehab or hospitalizations prior to admission? Yes   Has the patient had major surgery during 100 days prior to admission? Yes              Current Medications   Current Facility-Administered Medications:  .  0.9 %  sodium chloride infusion, , Intravenous, PRN, Rigoberto Noel, MD, Stopped at 05/04/20 1311 .  acetaminophen (TYLENOL) tablet 650 mg, 650 mg, Oral, Q6H PRN, Norm Parcel, PA-C .  amLODipine (NORVASC) tablet 5 mg, 5 mg, Oral, Daily, Norm Parcel, PA-C, 5 mg at 05/08/20 0856 .  chlorhexidine gluconate (MEDLINE KIT) (PERIDEX) 0.12 % solution 15 mL, 15 mL, Mouth Rinse, BID, Rigoberto Noel, MD, 15 mL at 05/07/20 2119 .  enoxaparin (LOVENOX) injection 40 mg, 40 mg, Subcutaneous, Q24H, Saverio Danker, PA-C, 40 mg at 05/07/20 1302 .  feeding supplement (GLUCERNA SHAKE) (GLUCERNA SHAKE) liquid 237 mL, 237 mL, Oral, TID BM, Meuth, Brooke A, PA-C, 237 mL at 05/08/20 0858 .  finasteride (PROSCAR) tablet 5 mg, 5 mg, Oral, Daily, Norm Parcel, PA-C, 5 mg at 05/08/20 0856 .  insulin aspart (novoLOG) injection 0-20 Units, 0-20 Units, Subcutaneous, Q4H, Rigoberto Noel, MD, 3 Units at 05/07/20 2348 .  lisinopril (ZESTRIL) tablet 40 mg, 40 mg, Oral, Daily, Norm Parcel, PA-C, 40 mg at 05/08/20 0856 .  morphine 2 MG/ML injection 2 mg, 2 mg, Intravenous, Q4H PRN, Meuth, Brooke A, PA-C .  naphazoline-glycerin (CLEAR EYES REDNESS) ophth solution 1-2 drop, 1-2 drop, Both Eyes, QID PRN, Greta Doom, MD .  ondansetron [facility]) injection 4 mg, 4 mg, Intravenous, Q4H PRN, Saverio Danker, PA-C .  ondansetron (ZOFRAN-ODT) disintegrating tablet 4 mg, 4 mg, Oral, Q6H PRN **OR** ondansetron (ZOFRAN) injection 4 mg, 4 mg, Intravenous, Q6H PRN, Saverio Danker, PA-C .  oxybutynin (DITROPAN) tablet 5 mg, 5 mg, Oral, Daily, Norm Parcel, PA-C, 5 mg at 05/08/20 0856 .  oxyCODONE (Oxy IR/ROXICODONE) immediate release tablet 5-10 mg, 5-10 mg, Oral, Q4H PRN, Meuth, Brooke A, PA-C, 10 mg at 05/06/20 1208 .  pantoprazole (PROTONIX) EC tablet 40 mg, 40 mg, Oral, Daily, Norm Parcel, PA-C, 40 mg at 05/08/20 0856 .  polyvinyl alcohol (LIQUIFILM TEARS) 1.4 % ophthalmic solution 1 drop, 1 drop, Both Eyes, PRN, Johnson, [employer], PA-C .  predniSONE (DELTASONE) tablet 20 mg,  20 mg, Oral, BID WC, Kirby-Graham, Karsten Fells, NP, 20 mg at 05/08/20 0856 .  pyridostigmine (MESTINON) tablet 30 mg, 30 mg, Oral, Q8H, Kirby-Graham, Karsten Fells, NP, 30 mg at 05/08/20 0548   Patients Current Diet:     Diet Order                      DIET SOFT Room service appropriate? Yes; Fluid consistency: Thin  Diet effective now                      Precautions / Restrictions Precautions Precautions: Fall,Other (comment) Precaution Comments: watch 02, colostomy Restrictions Weight Bearing Restrictions: No    Has the patient had 2 or more falls or a fall with injury in the past year? No   Prior Activity Level Limited Community (1-2x/wk): working at [employer] and driving until August when havin gissues with his MG; decline in function since then   Prior Functional Level Self Care: Did the patient need help bathing, dressing, using the toilet or eating? Independent   Indoor Mobility: Did the patient need assistance with walking from room to room (with or without device)? Independent   Stairs: Did the patient need assistance with internal or external stairs (with or without device)? Independent   Functional Cognition: Did the patient need help planning regular tasks such as shopping or remembering to take medications? [place] / [place] Devices/Equipment: Cane (specify quad or straight) Home Equipment: Shower seat   Prior Device Use: Indicate devices/aids used by the patient prior to current illness, exacerbation or injury? None of the above   Current Functional Level Cognition   Overall Cognitive Status: No family/caregiver present to determine baseline cognitive functioning Difficult to assess due to: Non-English speaking Orientation Level: Oriented X4 General Comments: Pt pleasantly participatory but needs cues for activity pacing and safety with mobility; Pt very motivated to go home alone but decreased awareness of physical  deficits    Extremity Assessment (includes Sensation/Coordination)   Upper Extremity Assessment: Generalized weakness  Lower Extremity Assessment: Defer to PT evaluation     ADLs   Overall ADL's : Needs assistance/impaired Eating/Feeding: Set up,Sitting Eating/Feeding Details (indicate cue type and reason): Setup for breakfast, able to open packaging Grooming: Set up,Sitting,Wash/dry hands Grooming Details (indicate cue type and reason): Setup to wash hands seated in recliner Upper Body Bathing: Minimal assistance,Sitting Lower Body Bathing: Moderate assistance,Sit to/from stand Upper Body Dressing : Set up,Sitting Lower Body Dressing: Moderate assistance,Sitting/lateral leans,Sit to/from stand Lower Body Dressing Details (indicate cue type and reason): Unable to reach feet seated in recliner (reports he sits in office chair that is adjustable or on EOB for tasks though reports EOB not low and pt reports need for lower surface to reach feet). May benefit from AE instruction. Toilet Transfer: Minimal Wellsite geologist Details (indicate cue type and reason): simulated with short steps to recliner Toileting- Clothing Manipulation and Hygiene: Moderate assistance,Sit to/from stand Functional mobility during ADLs: Min guard,Rolling walker,Cueing for safety,Cueing for sequencing General ADL Comments: Pt with continued deficits in cardiopulmonary tolerance (improving O2 requirements but still desats), decreased strength     Mobility   Overal bed mobility: Needs Assistance Bed Mobility: Supine to Sit Rolling: Mod assist Sidelying to sit: Mod assist Supine to sit: Mod assist,HOB elevated Sit to sidelying: Mod assist General bed mobility comments: Heavy assist for trunk advancement to EOB, use of bed rails and handheld assist from therapist     Transfers   Overall transfer level: Needs assistance Equipment used: Rolling walker (2 wheeled) Transfers: Sit to/from  Stand Sit to Stand: Min assist,Mod assist General transfer comment: minA to power up, cues for hand placement. ModA for safety with stand to sit due to BLE fatigue x3 total reps     Ambulation / Gait / Stairs / Wheelchair Mobility   Ambulation/Gait Ambulation/Gait assistance: Min guard Gait  Distance (Feet): 100 Feet Assistive device: Rolling walker (2 wheeled) Gait Pattern/deviations: Shuffle,Step-through pattern,Decreased stride length,Trunk flexed General Gait Details: SpO2 88-94% on 1L O2 [place] during mobility, improves after seated rest break; HR 72-94 bpm; pt needs min cues for body orientation with RW and activity pacing, c/o BLE weakness but orthostatics stable per dinamap Gait velocity: decr Gait velocity interpretation: <1.31 ft/sec, indicative of household ambulator     Posture / Balance Dynamic Sitting Balance Sitting balance - Comments: initially requiring min assist for posterior truncal assist, correcting to supervision for safety with prolonged sitting Balance Overall balance assessment: History of Falls,Needs assistance Sitting-balance support: Feet supported,Bilateral upper extremity supported Sitting balance-Leahy Scale: Fair Sitting balance - Comments: initially requiring min assist for posterior truncal assist, correcting to supervision for safety with prolonged sitting Postural control: Posterior lean Standing balance support: During functional activity,Bilateral upper extremity supported Standing balance-Leahy Scale: Poor Standing balance comment: reliant on UE support in standing     Special needs/care consideration Spanish Interpreting needed 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 with [place] RN working with him    Previous Home Environment  Living Arrangements: Alone  Lives With: Alone Available Help at Discharge: Family,Available PRN/intermittently Type of Home: House Home Layout: One level Home Access: Ramped entrance Bathroom Shower/Tub: Chiropodist:  Standard Bathroom Accessibility: Yes How Accessible: Accessible via walker Home Care Services: No Additional Comments: Does not use shower seat   Discharge Living Setting Plans for Discharge Living Setting: Patient's home,Alone Type of Home at Discharge: House Discharge Home Layout: One level Discharge Home Access: Ramped entrance Discharge Bathroom Shower/Tub: Tub/shower unit Discharge Bathroom Toilet: Standard Discharge Bathroom Accessibility: Yes How Accessible: Accessible via walker Does the patient have any problems obtaining your medications?: No   Social/Family/Support Systems Contact Information: stepdaughter, [name] Anticipated Caregiver: [name] intermitent only Anticipated Caregiver's Contact Information: see above Ability/Limitations of Caregiver: intermittent only; [name] is Personnel officer Availability: Intermittent Discharge Plan Discussed with Primary Caregiver: Yes Is Caregiver In Agreement with Plan?: Yes Does Caregiver/Family have Issues with Lodging/Transportation while Pt is in Rehab?: No   Goals Patient/Family Goal for Rehab: Mod I with PT and OT Expected length of stay: ELOS 5 to 7 days Cultural Considerations: Spanish speaker Additional Information: He wants to be home by Christmas Eve Pt/Family Agrees to Admission and willing to participate: Yes Program Orientation Provided & Reviewed with Pt/Caregiver Including Roles  & Responsibilities: Yes   Decrease burden of Care through IP rehab admission: n/a   Possible need for SNF placement upon discharge: not anticipated   Patient Condition: I have reviewed medical records from [facility] , spoken with CM, and patient and daughter. I met with patient at the bedside for inpatient rehabilitation assessment.  Patient will benefit from ongoing PT and OT, can actively participate in 3 hours of therapy a day 5 days of the week, and can make measurable gains during the admission.  Patient will also benefit  from the coordinated team approach during an Inpatient Acute Rehabilitation admission.  The patient will receive intensive therapy as well as Rehabilitation physician, nursing, social worker, and care management interventions.  Due to bladder management, bowel management, safety, skin/wound care, disease management, medication administration, pain management and patient education the patient requires 24 hour a day rehabilitation nursing.  The patient is currently min to mod assist overall with mobility and basic ADLs.  Discharge setting and therapy post discharge at home with home health is anticipated.  Patient has agreed to participate in the  Acute Inpatient Rehabilitation Program and will admit today.   Preadmission Screen Completed By:  Cleatrice Burke, 05/08/2020 11:01 AM ______________________________________________________________________   Discussed status with Dr. Posey Pronto  on  05/08/2020  at 1108 and received approval for admission today.   Admission Coordinator:  Cleatrice Burke, RN, time  4585 Date 05/08/2020    Assessment/Plan: Diagnosis: Debility    1. Does the need for close, 24 hr/day Medical supervision in concert with the patient's rehab needs make it unreasonable for this patient to be served in a less intensive setting? Potentially  2. Co-Morbidities requiring supervision/potential complications: myasthenia gravis on chronic prednisone as well as hypertension, BPH, Acute blood loss anemia  3. Due to bladder management, safety, skin/wound care, disease management, pain management and patient education, does the patient require 24 hr/day rehab nursing? Potentially 4. Does the patient require coordinated care of a physician, rehab nurse, PT, OT to address physical and functional deficits in the context of the above medical diagnosis(es)? Potentially Addressing deficits in the following areas: balance, endurance, locomotion, strength, transferring, bathing, dressing,  toileting and psychosocial support 5. Can the patient actively participate in an intensive therapy program of at least 3 hrs of therapy 5 days a week? Yes 6. The potential for patient to make measurable gains while on inpatient rehab is good 7. Anticipated functional outcomes upon discharge from inpatient rehab: modified independent PT, modified independent OT, n/a SLP 8. Estimated rehab length of stay to reach the above functional goals is: 3-6 days. 9. Anticipated discharge destination: Home 10. Overall Rehab/Functional Prognosis: excellent     MD Signature: Delice Lesch, MD, ABPMR          Revision History                        Note Details  Author Jamse Arn, MD File Time 05/08/2020 11:18 AM  Author Type Physician Status Signed  Last Editor Jamse Arn, MD Service Physical Medicine and Rehabilitation

## 2020-05-08 NOTE — Progress Notes (Signed)
Patient transferred to 4W form 5N via nurse and nurse tech via transport chair. Patient adjusting well to floor.

## 2020-05-08 NOTE — H&P (Signed)
Physical Medicine and Rehabilitation Admission H&P    Chief Complaint  Patient presents with  . Abdominal Pain    Constipation   : HPI: Danny Walker is a 74 year old right-handed non-English-speaking male with history of myasthenia gravis on chronic prednisone as well as hypertension, BPH.  History taken from chart review and patient. Per chart review patient lives alone 1 level home with ramped entrance.  He has a Psychiatrist in the area.  Reportedly independent prior to admission.  He presented on 05/01/2020 with extreme constipation as well as abdominal pain.  No nausea vomiting.  In the ED he was hypotensive as well as tachycardic. He did receive a bolus of IV fluids.  Noted low-grade fever 100.1.  Underwent CT scan that revealed diverticulitis with retroperitoneal free air some of the pelvis.  No abscess or phlegmon noted.  Patient underwent sigmoid resection with Jeanette Caprice pouch as well as biopsy of 2 lymph nodes that showed no dysplasia or malignancy on 05/01/2020 per Dr.Tsuei. WOC consulted for ostomy care and education.  Hospital course complicated by respiratory failure requiring intubation.  He was extubated on 05/02/2020.  Neurology did follow-up for management of patient's myasthenia gravis.  Acute blood loss anemia 9.4 and monitored.  Patient's diet has been advanced to mechanical soft.  He was cleared to begin Lovenox for DVT prophylaxis.  Therapy evaluations completed with recommendations of inpatient rehab services.  Patient admitted due to debility. Please see preadmission assessment from earlier today.   Review of Systems  Constitutional: Positive for malaise/fatigue.  HENT: Negative for hearing loss.   Eyes: Negative for blurred vision and double vision.  Respiratory: Negative for cough and shortness of breath.   Cardiovascular: Negative for chest pain, palpitations and leg swelling.  Gastrointestinal: Negative for heartburn and nausea.  Genitourinary: Positive for  urgency. Negative for dysuria, flank pain and hematuria.  Musculoskeletal: Positive for back pain.       Recent fall approximately 1 week prior to admission.  Skin: Negative for rash.  Neurological: Positive for weakness. Negative for sensory change.  All other systems reviewed and are negative.  Past Medical History:  Diagnosis Date  . BPH (benign prostatic hyperplasia)   . DDD (degenerative disc disease), lumbosacral   . Fatty liver   . Foley catheter in place   . History of gallstones   . History of prostatitis   . Hypertension   . IDA (iron deficiency anemia)   . Liver mass 09/17/2001   4.7cm , noted on [place] abd  . Low back pain   . Myasthenia gravis ([place])   . Nocturia   . 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 Spondylosis Lumbar  . Umbilical hernia   . Urinary retention 02/08/2018  . Weakness of both legs 02/2020   Past Surgical History:  Procedure Laterality Date  . COLON RESECTION SIGMOID N/A 05/01/2020   Procedure: COLON RESECTION SIGMOID;  Surgeon: Donnie Mesa, MD;  Location: [place];  Service: General;  Laterality: N/A;  . COLOSTOMY N/A 05/01/2020   Procedure: COLOSTOMY;  Surgeon: Donnie Mesa, MD;  Location: [place];  Service: General;  Laterality: N/A;  . ERCP W/ SPHICTEROTOMY  09/17/2001  . INSERTION OF MESH N/A 02/05/2016   Procedure: INSERTION OF MESH, UMBILICAL HERNIA;  Surgeon: Armandina Gemma, MD;  Location: [place];  Service: General;  Laterality: N/A;  . LAPAROSCOPIC CHOLECYSTECTOMY  09/22/2001  . LAPAROTOMY N/A 05/01/2020   Procedure: EXPLORATORY  LAPAROTOMY;  Surgeon: Donnie Mesa, MD;  Location: [place];  Service: General;  Laterality: N/A;  . TRANSURETHRAL RESECTION OF PROSTATE N/A 03/05/2018   Procedure: TRANSURETHRAL RESECTION OF THE PROSTATE (TURP);  Surgeon: Cleon Gustin, MD;  Location: [facility];  Service: Urology;  Laterality: N/A;  . UMBILICAL HERNIA REPAIR N/A 02/05/2016    Procedure: UMBILICAL HERNIA REPAIR WITH MESH PATCH;  Surgeon: Armandina Gemma, MD;  Location: [place];  Service: General;  Laterality: N/A;   Family History  Problem Relation Age of Onset  . Hypertension Mother   . Hypertension Father    Social History:  reports that he has never smoked. He has never used smokeless tobacco. He reports that he does not drink alcohol and does not use drugs. Allergies: No Known Allergies Medications Prior to Admission  Medication Sig Dispense Refill  . amLODipine (NORVASC) 5 MG tablet Take 1 tablet (5 mg total) by mouth daily. 30 tablet 11  . calcium-vitamin D (OSCAL 500/200 D-3) 500-200 MG-UNIT tablet Take 1 tablet by mouth 2 (two) times daily. 60 tablet 11  . cyanocobalamin (,VITAMIN B-12,) 1000 MCG/ML injection INYECTE 1ML EN EL MUSCULO UNA VEZ DURANTE 7 DIAS, Sil[place] 1 ANO (Patient taking differently: Inject 1,000 mcg into the muscle every 30 (thirty) days.) 10 mL 1  . diclofenac Sodium (VOLTAREN) 1 % GEL Apply 2 g topically 4 (four) times daily. (Patient taking differently: Apply 2 g topically 4 (four) times daily as needed (pain).) 100 g 3  . ferrous sulfate 325 (65 FE) MG tablet Take 1 tablet (325 mg total) by mouth daily. 30 tablet 3  . finasteride (PROSCAR) 5 MG tablet Take 1 tablet (5 mg total) by mouth every morning. 90 tablet 3  . lisinopril (ZESTRIL) 40 MG tablet TOME UNA TABLETA TODOS LOS DIAS (Patient taking differently: Take 40 mg by mouth daily. TOME UNA TABLETA TODOS LOS DIAS) 90 tablet 3  . naproxen (NAPROSYN) 250 MG tablet TOME UNA TABLETA DOS VECES AL DIA CUANDO SEA NECESARIO PARA EL DOLOR (Patient taking differently: Take 250 mg by mouth daily as needed (pain).) 28 tablet 0  . omeprazole (PRILOSEC) 40 MG capsule TOME UNA CAPSULA TODOS LOS DIAS EN LA MANANA (Patient taking differently: Take 40 mg by mouth daily.) 90 capsule 3  . oxybutynin (DITROPAN) 5 MG tablet Take 5 mg by  mouth daily.    . polyvinyl alcohol (LIQUIFILM TEARS) 1.4 % ophthalmic solution Place 1 drop into both eyes as needed for dry eyes. 15 mL 0  . predniSONE (DELTASONE) 20 MG tablet Take 1 tablet (20 mg total) by mouth 3 (three) times daily with meals. (Patient taking differently: Take 20 mg by mouth 2 (two) times daily with a meal.) 90 tablet 2  . pyridostigmine (MESTINON) 60 MG tablet Take 0.5 tablets (30 mg total) by mouth in the morning AND 0.5 tablets (30 mg total) daily with lunch AND 0.5 tablets (30 mg total) at bedtime. 45 tablet 0  . NEEDLE, DISP, 25 G (BD ECLIPSE) 25G X 1-1/2" MISC Administer b12 injection daily for 7 days, then weekly for 4 weeks, then monthly for 1 year 10 each 1  . Syringe/Needle, Disp, (SYRINGE 3CC/21GX1") 21G X 1" 3 ML MISC Administer b12 daily for 7 days, then weekly for 4 weeks, then monthly for 1 year. 10 each 1    Drug Regimen Review Drug regimen was reviewed and remains appropriate with no significant issues identified  Home: Home Living Family/patient expects to be discharged to:: Private residence Living Arrangements: Alone Available Help at Discharge: Family,Available PRN/intermittently Type of Home: House Home Access: [place]: One level Bathroom Shower/Tub: Chiropodist: [place]: Shower seat Additional Comments: Does not use shower seat   Functional History: Prior Function Level of Independence: Independent Comments: Has been having difficulty with LE strength recently so not working at this time 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 Drives. Reports 1 fall recently in shower.  Functional Status:  Mobility: Bed Mobility Overal bed mobility: Needs Assistance Bed Mobility: Supine to Sit Rolling: Mod assist Sidelying to sit: Mod assist Supine to sit: Mod assist,HOB elevated Sit to sidelying: Mod assist General bed mobility comments: Heavy assist  for trunk advancement to EOB, use of bed rails and handheld assist from therapist Transfers Overall transfer level: Needs assistance Equipment used: Rolling walker (2 wheeled) Transfers: Sit to/from Stand Sit to Stand: Min assist,Mod assist General transfer comment: minA to power up, cues for hand placement. ModA for safety with stand to sit due to BLE fatigue x3 total reps Ambulation/Gait Ambulation/Gait assistance: Min guard Gait Distance (Feet): 100 Feet Assistive device: Rolling walker (2 wheeled) Gait Pattern/deviations: Shuffle,Step-through pattern,Decreased stride length,Trunk flexed General Gait Details: SpO2 88-94% on 1L O2 [place] during mobility, improves after seated rest break; HR 72-94 bpm; pt needs min cues for body orientation with RW and activity pacing, c/o BLE weakness but orthostatics stable per dinamap Gait velocity: decr Gait velocity interpretation: <1.31 ft/sec, indicative of household ambulator    ADL: ADL Overall ADL's : Needs assistance/impaired Eating/Feeding: Set up,Sitting Eating/Feeding Details (indicate cue type and reason): Setup for breakfast, able to open packaging Grooming: Set up,Sitting,Wash/dry hands Grooming Details (indicate cue type and reason): Setup to wash hands seated in recliner Upper Body Bathing: Minimal assistance,Sitting Lower Body Bathing: Moderate assistance,Sit to/from stand Upper Body Dressing : Set up,Sitting Lower Body Dressing: Moderate assistance,Sitting/lateral leans,Sit to/from stand Lower Body Dressing Details (indicate cue type and reason): Unable to reach feet seated in recliner (reports he sits in office chair that is adjustable or on EOB for tasks though reports EOB not low and pt reports need for lower surface to reach feet). May benefit from AE instruction. Toilet Transfer: Minimal Wellsite geologist Details (indicate cue type and reason): simulated with short steps to recliner Toileting- Clothing  Manipulation and Hygiene: Moderate assistance,Sit to/from stand Functional mobility during ADLs: Min guard,Rolling walker,Cueing for safety,Cueing for sequencing General ADL Comments: Pt with continued deficits in cardiopulmonary tolerance (improving O2 requirements but still desats), decreased strength  Cognition: Cognition Overall Cognitive Status: No family/caregiver present to determine baseline cognitive functioning Orientation Level: Oriented X4 Cognition Arousal/Alertness: Awake/alert Behavior During Therapy: WFL for tasks assessed/performed Overall Cognitive Status: No family/caregiver present to determine baseline cognitive functioning General Comments: Pt pleasantly participatory but needs cues for activity pacing and safety with mobility; Pt very motivated to go home alone but decreased awareness of physical deficits Difficult to assess due to: Non-English speaking  Physical Exam: Blood pressure 133/77, pulse 68, temperature 98.7 F (37.1 C), temperature source Axillary, resp. rate 18, height 5\' 7"  (1.702 m), weight 83 kg, SpO2 94 %. Physical Exam Vitals reviewed.  Constitutional:      Appearance: Normal appearance. He is obese.  HENT:     Head: Normocephalic and atraumatic.     Right Ear: External ear normal.     Left Ear: External  ear normal.     Nose: Nose normal.  Eyes:     General:        Right eye: No discharge.        Left eye: No discharge.     Extraocular Movements: Extraocular movements intact.  Cardiovascular:     Rate and Rhythm: Normal rate and regular rhythm.  Pulmonary:     Effort: Pulmonary effort is normal. No respiratory distress.     Breath sounds: No stridor.  Abdominal:     General: Abdomen is flat. Bowel sounds are normal. There is no distension.     Comments: Abdominal wound dressed and colostomy in place  Musculoskeletal:     Cervical back: Normal range of motion and neck supple.     Comments: No edema or tenderness in extremities  Skin:     General: Skin is warm and dry.  Neurological:     Mental Status: He is alert.     Comments: Alert Motor: 4+/5 throughout  Psychiatric:        Mood and Affect: Mood normal.        Behavior: Behavior normal.     Results for orders placed or performed during the hospital encounter of 05/01/20 (from the past 48 hour(s))  Glucose, capillary     Status: None   Collection Time: 05/06/20  8:27 AM  Result Value Ref Range   Glucose-Capillary 75 70 - 99 mg/dL    Comment: Glucose reference range applies only to samples taken after fasting for at least 8 hours.  Glucose, capillary     Status: Abnormal   Collection Time: 05/06/20 11:10 AM  Result Value Ref Range   Glucose-Capillary 123 (H) 70 - 99 mg/dL    Comment: Glucose reference range applies only to samples taken after fasting for at least 8 hours.  Glucose, capillary     Status: Abnormal   Collection Time: 05/06/20  5:00 PM  Result Value Ref Range   Glucose-Capillary 108 (H) 70 - 99 mg/dL    Comment: Glucose reference range applies only to samples taken after fasting for at least 8 hours.  Glucose, capillary     Status: Abnormal   Collection Time: 05/06/20  7:55 PM  Result Value Ref Range   Glucose-Capillary 161 (H) 70 - 99 mg/dL    Comment: Glucose reference range applies only to samples taken after fasting for at least 8 hours.  Glucose, capillary     Status: Abnormal   Collection Time: 05/06/20 11:50 PM  Result Value Ref Range   Glucose-Capillary 106 (H) 70 - 99 mg/dL    Comment: Glucose reference range applies only to samples taken after fasting for at least 8 hours.  CBC     Status: Abnormal   Collection Time: 05/07/20  3:40 AM  Result Value Ref Range   WBC 6.6 4.0 - 10.5 K/uL   RBC 3.17 (L) 4.22 - 5.81 MIL/uL   Hemoglobin 9.4 (L) 13.0 - 17.0 g/dL   HCT 29.8 (L) 39.0 - 52.0 %   MCV 94.0 80.0 - 100.0 fL   MCH 29.7 26.0 - 34.0 pg   MCHC 31.5 30.0 - 36.0 g/dL   RDW 16.8 (H) 11.5 - 15.5 %   Platelets 207 150 - 400 K/uL    nRBC 0.8 (H) 0.0 - 0.2 %    Comment: Performed at [facility] [address], [place], [place] [address]  Glucose, capillary     Status: None   Collection Time:  05/07/20  3:55 AM  Result Value Ref Range   Glucose-Capillary 84 70 - 99 mg/dL    Comment: Glucose reference range applies only to samples taken after fasting for at least 8 hours.  Glucose, capillary     Status: None   Collection Time: 05/07/20  8:08 AM  Result Value Ref Range   Glucose-Capillary 76 70 - 99 mg/dL    Comment: Glucose reference range applies only to samples taken after fasting for at least 8 hours.  Glucose, capillary     Status: None   Collection Time: 05/07/20 11:37 AM  Result Value Ref Range   Glucose-Capillary 86 70 - 99 mg/dL    Comment: Glucose reference range applies only to samples taken after fasting for at least 8 hours.  Glucose, capillary     Status: Abnormal   Collection Time: 05/07/20  4:01 PM  Result Value Ref Range   Glucose-Capillary 134 (H) 70 - 99 mg/dL    Comment: Glucose reference range applies only to samples taken after fasting for at least 8 hours.  Glucose, capillary     Status: Abnormal   Collection Time: 05/07/20  8:20 PM  Result Value Ref Range   Glucose-Capillary 102 (H) 70 - 99 mg/dL    Comment: Glucose reference range applies only to samples taken after fasting for at least 8 hours.  Glucose, capillary     Status: Abnormal   Collection Time: 05/07/20 11:45 PM  Result Value Ref Range   Glucose-Capillary 122 (H) 70 - 99 mg/dL    Comment: Glucose reference range applies only to samples taken after fasting for at least 8 hours.  Glucose, capillary     Status: None   Collection Time: 05/08/20  4:10 AM  Result Value Ref Range   Glucose-Capillary 98 70 - 99 mg/dL    Comment: Glucose reference range applies only to samples taken after fasting for at least 8 hours.   No results found.   Medical Problem List and Plan: 1.  Debility secondary to perforated diverticulitis  complicated by septic shock.  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  Wound care nurse follow-up for colostomy care and education  -patient may not shower  -ELOS/Goals: 4-6 days/mod I  Admit to CIR 2.  Antithrombotics: -DVT/anticoagulation: Lovenox  -antiplatelet therapy: N/A 3. Pain Management: Oxycodone as needed 4. Mood: Provide emotional support  -antipsychotic agents: N/A 5. Neuropsych: This patient is capable of making decisions on his own behalf. 6. Skin/Wound Care: Routine skin checks 7. Fluids/Electrolytes/Nutrition: Routine in and outs  CMP ordered. 8. Acute blood anemia  Hemoglobin 9.4 on 12/16, CBC ordered for tomorrow 9.  Hypertension.  Lisinopril 40 mg daily, Norvasc 5 mg daily.    Monitor increase mobility 10.  Myasthenia gravis.  Prednisone 20 mg twice daily and Mestinon 30 mg every 8 hours.  Follow neurology services for management 11.  BPH.  Ditropan 5 mg daily, Proscar 5 mg daily  Monitor for retention 12.  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  SSI  Monitor with increased mobility  Cathlyn Parsons, PA-C 05/08/2020  I have personally performed a face to face diagnostic evaluation, including, but not limited to relevant history and physical exam findings, of this patient and developed relevant assessment and plan.  Additionally, I have reviewed and concur with the physician assistant's documentation above.  Delice Lesch, MD, ABPMR  The patient's status has not changed. Any changes from the pre-admission screening or documentation from the acute chart  are noted above.   Delice Lesch, MD, ABPMR

## 2020-05-08 NOTE — Progress Notes (Signed)
  I have Cir bed to admit patient to today. I met with patient at bedside with use of stratus interpreter/Irvin. He is in agreement to admit. I have notified his stepdaughter, [name], acute team and TOC. I will make the arrangements to admit today.  Danne Baxter, RN, MSN Rehab Admissions Coordinator [phone] 05/08/2020 10:50 AM

## 2020-05-08 NOTE — Progress Notes (Signed)
Inpatient Rehabilitation Medication Review by a Pharmacist  A complete drug regimen review was completed for this patient to identify any potential clinically significant medication issues.  Clinically significant medication issues were identified:  no  Check AMION for pharmacist assigned to patient if future medication questions/issues arise during this admission.  Pharmacist comments:   Inpatient meds continued on transfer to Rehab.  Discharge summary indicates plan to resume Calcium + Vitamin D BID, Ferrous sulfate daily, B-12 injection q30 days (last 04/22/20), and prn Voltaren gel and Naproxen daily prn. Now on Tylenol for mild pain and Oxycodone for moderate to severe pain.  Reasonable to resume Calcium + D, FeSO4 and B-12 after Rehab admit.  ELOS 4-6 days.   Time spent performing this drug regimen review (minutes):  [place], [place], [place] 05/08/2020 7:01 PM

## 2020-05-08 NOTE — Discharge Instructions (Signed)
Colostomy Home Guide, Adult  Colostomy surgery is done to create an opening in the front of the abdomen for stool (feces) to leave the body through an ostomy (stoma). Part of the large intestine is attached to the stoma. A bag, also called a pouch, is fitted over the stoma. Stool and gas will collect in the bag. After surgery, you will need to empty and change your colostomy bag as needed. You will also need to care for your stoma. How to care for the stoma Your stoma should look pink, red, and moist, like the inside of your cheek. Soon after surgery, the stoma may be swollen, but this swelling will go away within 6 weeks. To care for the stoma:  Keep the skin around the stoma clean and dry.  Use a clean, soft washcloth to gently wash the stoma and the skin around it. Clean using a circular motion, and wipe away from the stoma opening, not toward it. ? Use warm water and only use cleansers recommended by your health care provider. ? Rinse the stoma area with plain water. ? Dry the area around the stoma well.  Use stoma powder or ointment on your skin only as told by your health care provider. Do not use any other powders, gels, wipes, or creams on the skin around the stoma.  Check the stoma area every day for signs of infection. Check for: ? New or worsening redness, swelling, or pain. ? New or increased fluid or blood. ? Pus or warmth.  Measure the stoma opening regularly and record the size. Watch for changes. (It is normal for the stoma to get smaller as swelling goes away.) Share this information with your health care provider. How to empty the colostomy bag  Empty your bag at bedtime and whenever it is one-third to one-half full. Do not let the bag get more than half-full with stool or gas. The bag could leak if it gets too full. Some colostomy bags have a built-in gas release valve that releases gas often throughout the day. Follow these basic steps: 1. Wash your hands with soap and  water. 2. Sit far back on the toilet seat. 3. Put several pieces of toilet paper into the toilet water. This will prevent splashing as you empty stool into the toilet. 4. Remove the clip or the hook-and-loop fastener from the tail end of the bag. 5. Unroll the tail, then empty the stool into the toilet. 6. Clean the tail with toilet paper or a moist towelette. 7. Reroll the tail, and close it with the clip or the hook-and-loop fastener. 8. Wash your hands again. How to change the colostomy bag Change your bag every 3-4 days or as often as told by your health care provider. Also change the bag if it is leaking or separating from the skin, or if your skin around the stoma looks or feels irritated. Irritated skin may be a sign that the bag is leaking. Always have colostomy supplies with you, and follow these basic steps: 1. Wash your hands with soap and water. Have paper towels or tissues nearby to clean any discharge. 2. Remove the old bag and skin barrier. Use your fingers or a warm cloth to gently push the skin away from the barrier. 3. Clean the stoma area with water or with mild soap and water, as directed. Use water to rinse away any soap. 4. Dry the skin. You may use the cool setting on a hair dryer to do this.  5. Use a tracing pattern (template) to cut the skin barrier to the size needed. 6. If you are using a two-piece bag, attach the bag and the skin barrier to each other. Add the barrier ring, if you use one. 7. If directed, apply stoma powder or skin barrier gel to the skin. 8. Warm the skin barrier with your hands, or blow with a hair dryer for 5-10 seconds. 9. Remove the paper from the adhesive strip of the skin barrier. 10. Press the adhesive strip onto the skin around the stoma. 11. Gently rub the skin barrier onto the skin. This creates heat that helps the barrier to stick. 12. Apply stoma tape to the edges of the skin barrier, if desired. 13. Wash your hands again. General  recommendations  Avoid wearing tight clothes or having anything press directly on your stoma or bag. Change your clothing whenever it is soiled or damp.  You may shower or bathe with the bag on or off. Do not use harsh or oily soaps or lotions. Dry the skin and bag after bathing.  Store all supplies in a cool, dry place. Do not leave supplies in extreme heat because some parts can melt or not stick as well.  Whenever you leave home, take extra clothing and an extra skin barrier and bag with you.  If your bag gets wet, you can dry it with a hair dryer on the cool setting.  To prevent odor, you may put drops of ostomy deodorizer in the bag.  If recommended by your health care provider, put ostomy lubricant inside the bag. This helps stool to slide out of the bag more easily and completely. Contact a health care provider if:  You have new or worsening redness, swelling, or pain around your stoma.  You have new or increased fluid or blood coming from your stoma.  Your stoma feels warm to the touch.  You have pus coming from your stoma.  Your stoma extends in or out farther than normal.  You need to change your bag every day.  You have a fever. Get help right away if:  Your stool is bloody.  You have nausea or you vomit.  You have trouble breathing. Summary  Measure your stoma opening regularly and record the size. Watch for changes.  Empty your bag at bedtime and whenever it is one-third to one-half full. Do not let the bag get more than half-full with stool or gas.  Change your bag every 3-4 days or as often as told by your health care provider.  Whenever you leave home, take extra clothing and an extra skin barrier and bag with you. This information is not intended to replace advice given to you by your health care provider. Make sure you discuss any questions you have with your health care provider. Document Revised: 08/29/2018 Document Reviewed: 11/02/2016 Elsevier  Patient Education  2020 [facility], [place] [phone]  OPEN ABDOMINAL SURGERY: POST OP INSTRUCTIONS  Always review your discharge instruction sheet given to you by the facility where your surgery was performed.  IF YOU HAVE DISABILITY OR FAMILY LEAVE FORMS, YOU MUST BRING THEM TO THE OFFICE FOR PROCESSING.  PLEASE DO NOT GIVE THEM TO YOUR DOCTOR.  1. A prescription for pain medication may be given to you upon discharge.  Take your pain medication as prescribed, if needed.  If narcotic pain medicine is not needed, then you may take acetaminophen (  Tylenol) or ibuprofen (Advil) as needed. 2. Take your usually prescribed medications unless otherwise directed. 3. If you need a refill on your pain medication, please contact your pharmacy. They will contact our office to request authorization.  Prescriptions will not be filled after 5pm or on week-ends. 4. You should follow a light diet the first few days after arrival home, such as soup and crackers, pudding, etc.unless your doctor has advised otherwise. A high-fiber, low fat diet can be resumed as tolerated.   Be sure to include lots of fluids daily. Most patients will experience some swelling and bruising on the chest and neck area.  Ice packs will help.  Swelling and bruising can take several days to resolve 5. Most patients will experience some swelling and bruising in the area of the incision. Ice pack will help. Swelling and bruising can take several days to resolve..  6. It is common to experience some constipation if taking pain medication after surgery.  Increasing fluid intake and taking a stool softener will usually help or prevent this problem from occurring.  A mild laxative (Milk of Magnesia or Miralax) should be taken according to package directions if there are no bowel movements after 48 hours. 7.  Any sutures or staples will be removed at the office during your follow-up visit. You may find that a  light gauze bandage over your incision may keep your staples from being rubbed or pulled. 8. ACTIVITIES:  You may resume regular (light) daily activities beginning the next day--such as daily self-care, walking, climbing stairs--gradually increasing activities as tolerated.  You may have sexual intercourse when it is comfortable.  Refrain from any heavy lifting or straining until approved by your doctor. a. You may drive when you no longer are taking prescription pain medication, you can comfortably wear a seatbelt, and you can safely maneuver your car and apply brakes 9. You should see your doctor in the office for a follow-up appointment approximately two weeks after your surgery.  Make sure that you call for this appointment within a day or two after you arrive home to insure a convenient appointment time. OTHER INSTRUCTIONS:  _____________________________________________________________ _____________________________________________________________  WHEN TO CALL YOUR DOCTOR: 1. Fever over 101.0 2. Inability to urinate 3. Nausea and/or vomiting 4. Extreme swelling or bruising 5. Continued bleeding from incision. 6. Increased pain, redness, or drainage from the incision. 7. Difficulty swallowing or breathing 8. Muscle cramping or spasms. 9. Numbness or tingling in hands or feet or around lips.  The clinic staff is available to answer your questions during regular business hours.  Please don't hesitate to call and ask to speak to one of the nurses if you have concerns.  For further questions, please visit [email or web address]

## 2020-05-08 NOTE — Progress Notes (Signed)
Physical Therapy Treatment Patient Details Name: Danny Walker MRN: [id number] DOB: Nov 03, 1945 Today's Date: 05/08/2020    History of Present Illness Patient is a 74 y/o male who presents with abdominal pain; found to have septic shock secondary to perforated diverticulitis, now s/p washout and Hartman's procedure 60/63, complicated by post op respiratory failure extubated 12/11. 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    PT Comments    Pt up in chair on arrival, agreeable to therapy session, with good participation. Pt making progress with mobility, able to perform gait trial on RA with SpO2 88-95%, but when desat pt improves to >90% with standing break and cues for pursed-lip breathing. Pt progressed gait distance to 18ft using RW and min guard, but continues to need increased assist for transfers due to BLE deconditioning. Pt performed seated BLE AROM therapeutic exercises with good tolerance. Pt continues to benefit from PT services to progress toward functional mobility goals. D/C recs below remain appropriate.  Follow Up Recommendations  Supervision for mobility/OOB;CIR     Equipment Recommendations  Rolling walker with 5" wheels    Recommendations for Other Services       Precautions / Restrictions Precautions Precautions: Fall;Other (comment) Precaution Comments: watch 02, colostomy Restrictions Weight Bearing Restrictions: No    Mobility  Bed Mobility               General bed mobility comments: pt up in chair pre/post  Transfers Overall transfer level: Needs assistance Equipment used: Rolling walker (2 wheeled) Transfers: Sit to/from Stand Sit to Stand: Mod assist;Min assist         General transfer comment: minA to power up, cues for hand placement. ModA for safety with stand to sit due to BLE fatigue  Ambulation/Gait Ambulation/Gait assistance: Min guard Gait Distance (Feet): 96 Feet Assistive  device: Rolling walker (2 wheeled) Gait Pattern/deviations: Shuffle;Step-through pattern;Decreased stride length;Trunk flexed Gait velocity: decr   General Gait Details: SpO2 88-95% on RA during mobility, improves after seated rest break and standing break with cues for pursed-lip breathing (mostly 90-93% during gait trial); HR 64 bpm resting and 80's with mobility; pt needs min cues for body orientation with RW and activity pacing   Stairs             Wheelchair Mobility    Modified Rankin (Stroke Patients Only)       Balance Overall balance assessment: History of Falls;Needs assistance Sitting-balance support: Feet supported;Bilateral upper extremity supported Sitting balance-Leahy Scale: Fair Sitting balance - Comments: static sitting and weight shifting no LOB   Standing balance support: During functional activity;Bilateral upper extremity supported Standing balance-Leahy Scale: Poor Standing balance comment: reliant on UE support in standing                            Cognition Arousal/Alertness: Awake/alert Behavior During Therapy: WFL for tasks assessed/performed Overall Cognitive Status: No family/caregiver present to determine baseline cognitive functioning                                 General Comments: Pt pleasantly participatory; Pt very motivated to go home and improving awareness of physical deficits.      Exercises General Exercises - Lower Extremity Ankle Circles/Pumps: AROM;Strengthening;Both;20 reps;Seated Long Arc Quad: AROM;Strengthening;Both;10 reps;Seated Hip Flexion/Marching: AROM;Strengthening;Both;20 reps;Seated    General Comments General comments (skin  integrity, edema, etc.): interpretation via live interpreter      Pertinent Vitals/Pain Pain Assessment: 0-10 Pain Score: 4  Pain Location: abdomen Pain Descriptors / Indicators: Sore;Operative site guarding;Guarding Pain Intervention(s): Monitored during  session;Repositioned    Home Living                      Prior Function            PT Goals (current goals can now be found in the care plan section) Acute Rehab PT Goals Patient Stated Goal: to go home PT Goal Formulation: With patient Time For Goal Achievement: 05/17/20 Potential to Achieve Goals: Good Progress towards PT goals: Progressing toward goals    Frequency    Min 3X/week      PT Plan Current plan remains appropriate    Co-evaluation              AM-PAC PT "6 Clicks" Mobility   Outcome Measure  Help needed turning from your back to your side while in a flat bed without using bedrails?: A Little Help needed moving from lying on your back to sitting on the side of a flat bed without using bedrails?: A Lot Help needed moving to and from a bed to a chair (including a wheelchair)?: A Lot Help needed standing up from a chair using your arms (e.g., wheelchair or bedside chair)?: A Little Help needed to walk in hospital room?: A Little Help needed climbing 3-5 steps with a railing? : A Lot 6 Click Score: 15    End of Session Equipment Utilized During Treatment: Gait belt Activity Tolerance: Patient tolerated treatment well Patient left: in chair;with chair alarm set;with call bell/phone within reach Nurse Communication: Mobility status PT Visit Diagnosis: Pain;Difficulty in walking, not elsewhere classified (R26.2);Unsteadiness on feet (R26.81) Pain - part of body:  (abdomen)     Time: 1324-4010 PT Time Calculation (min) (ACUTE ONLY): 24 min  Charges:  $Gait Training: 8-22 mins $Therapeutic Exercise: 8-22 mins                     Leanord Thibeau P., PTA Acute Rehabilitation Services Pager: [phone] Office: [place] 05/08/2020, 12:36 PM

## 2020-05-09 ENCOUNTER — Inpatient Hospital Stay (HOSPITAL_COMMUNITY): Payer: Medicare Other | Admitting: Occupational Therapy

## 2020-05-09 ENCOUNTER — Inpatient Hospital Stay (HOSPITAL_COMMUNITY): Payer: Medicare Other

## 2020-05-09 DIAGNOSIS — R5381 Other malaise: Principal | ICD-10-CM

## 2020-05-09 LAB — CBC
HCT: 28.9 % — ABNORMAL LOW (ref 39.0–52.0)
Hemoglobin: 9.5 g/dL — ABNORMAL LOW (ref 13.0–17.0)
MCH: 30.2 pg (ref 26.0–34.0)
MCHC: 32.9 g/dL (ref 30.0–36.0)
MCV: 91.7 fL (ref 80.0–100.0)
Platelets: 287 10*3/uL (ref 150–400)
RBC: 3.15 MIL/uL — ABNORMAL LOW (ref 4.22–5.81)
RDW: 17 % — ABNORMAL HIGH (ref 11.5–15.5)
WBC: 9 10*3/uL (ref 4.0–10.5)
nRBC: 0.9 % — ABNORMAL HIGH (ref 0.0–0.2)

## 2020-05-09 LAB — GLUCOSE, CAPILLARY
Glucose-Capillary: 125 mg/dL — ABNORMAL HIGH (ref 70–99)
Glucose-Capillary: 99 mg/dL (ref 70–99)
Glucose-Capillary: 87 mg/dL (ref 70–99)
Glucose-Capillary: 120 mg/dL — ABNORMAL HIGH (ref 70–99)
Glucose-Capillary: 159 mg/dL — ABNORMAL HIGH (ref 70–99)
Glucose-Capillary: 105 mg/dL — ABNORMAL HIGH (ref 70–99)

## 2020-05-09 LAB — CREATININE, SERUM
Creatinine, Ser: 0.85 mg/dL (ref 0.61–1.24)
GFR, Estimated: >60 mL/min (ref >60)

## 2020-05-09 NOTE — Evaluation (Signed)
Physical Therapy Assessment and Plan  Patient Details  Name: Danny Walker MRN: [id number] Date of Birth: October 23, 1945  PT Diagnosis: Abnormal posture, Difficulty walking, Dizziness and giddiness, Edema, Impaired sensation, Muscle weakness and Pain in abdomen Rehab Potential: Good ELOS: 10-12   Today's Date: 05/09/2020 PT Individual Time: 6010-9323 PT Individual Time Calculation (min): 85 min    Hospital Problem: Principal Problem:   Debility   Past Medical History:  Past Medical History:  Diagnosis Date  . BPH (benign prostatic hyperplasia)   . DDD (degenerative disc disease), lumbosacral   . Fatty liver   . Foley catheter in place   . History of gallstones   . History of prostatitis   . Hypertension   . IDA (iron deficiency anemia)   . Liver mass 09/17/2001   4.7cm , noted on [place] abd  . Low back pain   . Myasthenia gravis ([place])   . Nocturia   . 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 Spondylosis Lumbar  . Umbilical hernia   . Urinary retention 02/08/2018  . Weakness of both legs 02/2020   Past Surgical History:  Past Surgical History:  Procedure Laterality Date  . COLON RESECTION SIGMOID N/A 05/01/2020   Procedure: COLON RESECTION SIGMOID;  Surgeon: Donnie Mesa, MD;  Location: [place];  Service: General;  Laterality: N/A;  . COLOSTOMY N/A 05/01/2020   Procedure: COLOSTOMY;  Surgeon: Donnie Mesa, MD;  Location: [place];  Service: General;  Laterality: N/A;  . ERCP W/ SPHICTEROTOMY  09/17/2001  . INSERTION OF MESH N/A 02/05/2016   Procedure: INSERTION OF MESH, UMBILICAL HERNIA;  Surgeon: Armandina Gemma, MD;  Location: [place];  Service: General;  Laterality: N/A;  . LAPAROSCOPIC CHOLECYSTECTOMY  09/22/2001  . LAPAROTOMY N/A 05/01/2020   Procedure: EXPLORATORY LAPAROTOMY;  Surgeon: Donnie Mesa, MD;  Location: [place];  Service: General;  Laterality: N/A;  . TRANSURETHRAL RESECTION OF PROSTATE N/A 03/05/2018   Procedure:  TRANSURETHRAL RESECTION OF THE PROSTATE (TURP);  Surgeon: Cleon Gustin, MD;  Location[facility];  Service: Urology;  Laterality: N/A;  . UMBILICAL HERNIA REPAIR N/A 02/05/2016   Procedure: UMBILICAL HERNIA REPAIR WITH MESH PATCH;  Surgeon: Armandina Gemma, MD;  Location: [place];  Service: General;  Laterality: N/A;    [place] is a 74 year old right-handed non-English-speaking male with history of myasthenia gravis on chronic prednisone as well as hypertension, BPH.  History taken from chart review and patient. Per chart review patient lives alone 1 level home with ramped entrance.  He has a Psychiatrist in the area.  Reportedly independent prior to admission.  He presented on 05/01/2020 with extreme constipation as well as abdominal pain.  No nausea vomiting.  In the ED he was hypotensive as well as tachycardic. He did receive a bolus of IV fluids.  Noted low-grade fever 100.1.  Underwent CT scan that revealed diverticulitis with retroperitoneal free air some of the pelvis.  No abscess or phlegmon noted.  Patient underwent sigmoid resection with Jeanette Caprice pouch as well as biopsy of 2 lymph nodes that showed no dysplasia or malignancy on 05/01/2020 per Dr.Tsuei. WOC consulted for ostomy care and education.  Hospital course complicated by respiratory failure requiring intubation.  He was extubated on 05/02/2020.  Neurology did follow-up for management of patient's myasthenia gravis.  Acute blood loss anemia 9.4 and monitored.  Patient's diet has been advanced to mechanical soft.  He was cleared to begin Lovenox for DVT prophylaxis.   Patient transferred to CIR on 05/08/2020 .   Patient currently requires mod with mobility secondary to muscle weakness and muscle joint tightness, decreased cardiorespiratoy endurance and decreased standing balance, decreased postural control and decreased balance strategies.  Prior to  hospitalization, patient was modified independent  with mobility and lived Alone in a House home.  Home access is  Ramped entrance.  Pt avoided using stairs unless there were 2 railings.  Patient will benefit from skilled PT intervention to maximize safe functional mobility, minimize fall risk and decrease caregiver burden for planned discharge home with intermittent assist.  Anticipate patient will benefit from follow up [facility] at discharge.  PT - End of Session Activity Tolerance: Tolerates < 10 min activity with changes in vital signs Endurance Deficit: Yes Endurance Deficit Description: dyspneic after up/down 12 steps or gait x 100' PT Assessment Rehab Potential (ACUTE/IP ONLY): Good PT Patient demonstrates impairments in the following area(s): Balance;Pain;Edema;Endurance;Motor;Sensory PT Transfers Functional Problem(s): Bed Mobility;Bed to Chair;Car;Furniture PT Locomotion Functional Problem(s): Ambulation;Stairs PT Plan PT Intensity: Minimum of 1-2 x/day ,45 to 90 minutes PT Frequency: 5 out of 7 days PT Duration Estimated Length of Stay: 10-12 PT Treatment/Interventions: Ambulation/gait training;Discharge planning;DME/adaptive equipment instruction;Functional mobility training;Pain management;Psychosocial support;Therapeutic Activities;UE/LE Strength taining/ROM;Balance/vestibular training;Community reintegration;Neuromuscular re-education;Patient/family education;Stair training;Therapeutic Exercise;UE/LE Coordination activities;Wheelchair propulsion/positioning PT Transfers Anticipated Outcome(s): modified independent basic and supervision car PT Locomotion Anticipated Outcome(s): with LRAD for all: modified independent household ambulation x 50'; Supervision community ambulation x 150'; supervision up/down ramp for home entry PT Recommendation Follow Up Recommendations: Home health PT Patient destination: Home Equipment Recommended: Rolling walker with 5" wheels Equipment Details: wife  owned a walker and cane; type unknown   PT Evaluation Spanish interpreter Nicole Kindred present throughout session.  Pt reported that he does not visit his stepdaughter's house, because it is big, has many stairs and the rooms are big "with nothing to hold onto".  Pt slightly dizzy upon sitting up, which resolved somewhat during session. Pt easily fatigued and needed multiple seated rest breaks throughout session.   Precautions/Restrictions Precautions Precautions: Fall;Other (comment) Precaution Comments: colostomy Restrictions Weight Bearing Restrictions: No General   Vital SignsTherapy Vitals Temp: 99.2 F (37.3 C) Temp Source: Oral Pulse Rate: 88 Resp: 16 BP: 105/69 Patient Position (if appropriate): Sitting Oxygen Therapy SpO2: 98 % O2 Device: Room Air Pain Pain Assessment Pain Scale: Faces Pain Score: 0-No pain (at rest; but grimaces due to abdominal tightness, with movement) Home Living/Prior Functioning Home Living Available Help at Discharge: Family (step daughter checks on him, unsure if she can provide 24 hr initially if needed) Type of Home: House Home Access: Ramped entrance- installed for his wife, now deceased Home Layout: One level Bathroom Shower/Tub: Chiropodist: Standard Bathroom Accessibility: Yes  Lives With: Alone Prior Function Level of Independence: Independent with basic ADLs;Independent with gait  Able to Take Stairs?: No (difficulty recently with knees buckling) Driving: Yes Comments: Has been having difficulty with LE strength recently so not working at this time 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 Drives. Reports 1 fall recently in shower, and 1 fall when leaning over to pick up an envelope on the ground. Vision/Perception - no changes per pt Perception Perception: Within Functional Limits Praxis Praxis: Intact  Cognition Overall Cognitive Status: Within Functional Limits for  tasks assessed Arousal/Alertness: Awake/alert Orientation Level: Oriented X4 Memory: Appears intact Safety/Judgment: Appears intact Sensation  Sensation Light Touch: Appears Intact Proprioception: Appears Intact Additional Comments: pt reports tingling RLE, from knee distally,  starting 1 month PTA Coordination Gross Motor Movements are Fluid and Coordinated: No Coordination and Movement Description: pain in abdomen limits fluid movements Heel Shin Test: decreased excursion bil Motor  Motor Motor: Other (comment) Motor - Skilled Clinical Observations: generalized weakness; limited musclular endurance RLE   Trunk/Postural Assessment  Cervical Assessment Cervical Assessment: Exceptions to [facility] (forward head) Thoracic Assessment Thoracic Assessment: Exceptions to [facility] (kyphotic, R posterior rib hump) Lumbar Assessment Lumbar Assessment: Exceptions to [facility] (asymmetrical; L trunk shortened; scoliotic spine) Postural Control Postural Control: Deficits on evaluation (protective responses inadequate and unsafe; hx falls PTA)  Balance Balance Balance Assessed: Yes Static Sitting Balance Static Sitting - Balance Support: No upper extremity supported Static Sitting - Level of Assistance: 5: Stand by assistance Dynamic Sitting Balance Dynamic Sitting - Balance Support: No upper extremity supported Dynamic Sitting - Level of Assistance: 5: Stand by assistance Reach (Patient is able to reach ___ inches to right, left, forward, back): 6 Dynamic Sitting - Balance Activities: Forward lean/weight shifting Static Standing Balance Static Standing - Balance Support: No upper extremity supported Static Standing - Level of Assistance: 4: Min assist Dynamic Standing Balance Dynamic Standing - Balance Support: Right upper extremity supported;Left upper extremity supported Dynamic Standing - Level of Assistance: 4: Min assist Extremity Assessment  RUE Assessment RUE Assessment: Exceptions to  [facility] Passive Range of Motion (PROM) Comments: WFLS for all joints Active Range of Motion (AROM) Comments: WFLs for all joints General Strength Comments: strength 4/5 shoulders 3+/5 all other joints LUE Assessment LUE Assessment: Exceptions to [facility] Passive Range of Motion (PROM) Comments: WFls for all joints Active Range of Motion (AROM) Comments: WFLs for all joints General Strength Comments: strength 4/5 shoulders 3+/5 all other joints RLE Assessment RLE Assessment: Exceptions to WFL (moderate pitting edema foot and ankle) Passive Range of Motion (PROM) Comments: tight hamstrings General Strength Comments: grossly in sitting: 4/5 hip flexion/hip abduction/adduction, ankle DF, 4+/5 knee extension LLE Assessment LLE Assessment: Exceptions to WFL (moderate pitting edema foot and ankle) Passive Range of Motion (PROM) Comments: tight hamstrings General Strength Comments: grossly in sitting: 4+/5 hip flexion/abduction/adduction, knee extension, 4/5 ankle DF  Care Tool Care Tool Bed Mobility Roll left and right activity   Roll left and right assist level: Supervision/Verbal cueing    Sit to lying activity   Sit to lying assist level: Moderate Assistance - Patient 50 - 74%    Lying to sitting edge of bed activity   Lying to sitting edge of bed assist level: Minimal Assistance - Patient > 75%     Care Tool Transfers Sit to stand transfer   Sit to stand assist level: Minimal Assistance - Patient > 75%    Chair/bed transfer   Chair/bed transfer assist level: Minimal Assistance - Patient > 75%     Physiological scientist transfer assist level: Minimal Assistance - Patient > 75%      Care Tool Locomotion Ambulation   Assist level: Moderate Assistance - Patient 50 - 74% Assistive device: Hand held assist Max distance: 100  Walk 10 feet activity   Assist level: Moderate Assistance - Patient - 50 - 74% Assistive device: Hand held assist   Walk 50 feet with 2  turns activity   Assist level: Moderate Assistance - Patient - 50 - 74% Assistive device: Hand held assist  Walk  150 feet activity Walk 150 feet activity did not occur: Safety/medical concerns (RLE trembling/fatigued)      Walk 10 feet on uneven surfaces activity Walk 10 feet on uneven surfaces activity did not occur: Safety/medical concerns (RLE trembling/fatigue)      Stairs   Assist level: Minimal Assistance - Patient > 75% Stairs assistive device: 2 hand rails Max number of stairs: 12 (R and L knee buckling slightly when descending)  Walk up/down 1 step activity   Walk up/down 1 step (curb) assist level: Minimal Assistance - Patient > 75% Walk up/down 1 step or curb assistive device: 2 hand rails    Walk up/down 4 steps activity Walk up/down 4 steps assist level: Minimal Assistance - Patient > 75% Walk up/down 4 steps assistive device: 2 hand rails  Walk up/down 12 steps activity   Walk up/down 12 steps assist level: Minimal Assistance - Patient > 75% Walk up/down 12 steps assistive device: 2 hand rails  Pick up small objects from floor Pick up small object from the floor (from standing position) activity did not occur: Safety/medical concerns (R LE trembling/fatigue)      Wheelchair Will patient use wheelchair at discharge?: No     Wheelchair assist level: Supervision/Verbal cueing   Wheel 50 feet with 2 turns activity Wheelchair 50 feet with 2 turns activity did not occur: Safety/medical concerns (fatigue)    Wheel 150 feet activity Wheelchair 150 feet activity did not occur: Safety/medical concerns (fatigue)      Refer to Care Plan for Long Term Goals  SHORT TERM GOAL WEEK 1 PT Short Term Goal 1 (Week 1): = LTGs due to LOS  Recommendations for other services: None   Skilled Therapeutic Intervention Therapeutic exercises performed with LE to increase strength for functional mobility: seated 20 x 1 bil ankle pumps iwht 2 second hold, R/L long arc quad knee extensions.   Pt stated that he wants to go home ASAP, at least by 12/24.  PT discussed pt's risk of abdominal complications from a fall.  Pt stated that he needed to use toilet.  Pt stood up from w/c > stand in front of toilet for continent voiding of bladder, with CGA. W/c> recliner stand pivot transfer with mid assist.  Pt very fatigued.  Pt reluctant to have LEs elevated in recliner, but complied; PT educated pt about elevation, use of ankle pumps, etc.   At end of session, pt resting in recliner with seat pad alarm set, needs at hand, and interpreter present. Mobility Bed Mobility Bed Mobility: Rolling Right;Rolling Left;Right Sidelying to Sit;Sit to Supine Rolling Right: Supervision/verbal cueing Rolling Left: Supervision/Verbal cueing Right Sidelying to Sit: Minimal Assistance - Patient > 75% Supine to Sit: Minimal Assistance - Patient > 75% Sit to Supine: Moderate Assistance - Patient 50-74% Transfers Transfers: Stand Pivot Transfers Sit to Stand: Minimal Assistance - Patient > 75% Stand to Sit: Minimal Assistance - Patient > 75% Stand Pivot Transfers: Minimal Assistance - Patient > 75% Stand Pivot Transfer Details: Verbal cues for technique;Verbal cues for precautions/safety Stand Pivot Transfer Details (indicate cue type and reason): safe approach to seat, use of bil hands Transfer (Assistive device): None Locomotion  Gait Ambulation: Yes Gait Assistance: Moderate Assistance - Patient 50-74% Gait Distance (Feet): 100 Feet Assistive device: None Gait Assistance Details: Verbal cues for technique Gait Assistance Details: with fatigue, pt required mod assist for balance Gait Gait: Yes Gait Pattern: Impaired Gait Pattern: Decreased hip/knee flexion - right;Decreased weight shift to right;Decreased trunk rotation;Step-through pattern  Stairs / Additional Locomotion Stairs: Yes Stairs Assistance: Minimal Assistance - Patient > 75% Stair Management Technique: Two rails Number of Stairs:  12 Height of Stairs: 6 (and 4) Product manager Mobility: Yes Wheelchair Assistance: Chartered loss adjuster: Both upper extremities Wheelchair Parts Management: Supervision/cueing Distance: 50   Discharge Criteria: Patient will be discharged from PT if patient refuses treatment 3 consecutive times without medical reason, if treatment goals not met, if there is a change in medical status, if patient makes no progress towards goals or if patient is discharged from hospital.  The above assessment, treatment plan, treatment alternatives and goals were discussed and mutually agreed upon: by patient  Deniese Oberry 05/09/2020, 4:54 PM

## 2020-05-09 NOTE — Plan of Care (Signed)
  Problem: Consults Goal: RH GENERAL PATIENT EDUCATION Description: See Patient Education module for education specifics. Outcome: Progressing Goal: Skin Care Protocol Initiated - if Braden Score 18 or less Description: If consults are not indicated, leave blank or document N/A Outcome: Progressing   Problem: RH BOWEL ELIMINATION Goal: RH STG MANAGE BOWEL WITH ASSISTANCE Description: STG Manage Bowel with mod I Assistance. Outcome: Progressing   Problem: RH SKIN INTEGRITY Goal: RH STG SKIN FREE OF INFECTION/BREAKDOWN Description: Skin to remain free from additional breakdown with mod I assist while on rehab. Outcome: Progressing Goal: RH STG MAINTAIN SKIN INTEGRITY WITH ASSISTANCE Description: STG Maintain Skin Integrity With mod I Assistance. Outcome: Progressing Goal: RH STG ABLE TO PERFORM INCISION/WOUND CARE W/ASSISTANCE Description: STG Able To Perform Incision/Wound Care With mod I Assistance. Outcome: Progressing   Problem: RH SAFETY Goal: RH STG ADHERE TO SAFETY PRECAUTIONS W/ASSISTANCE/DEVICE Description: STG Adhere to Safety Precautions With mod I Assistance/Device. Outcome: Progressing   Problem: RH PAIN MANAGEMENT Goal: RH STG PAIN MANAGED AT OR BELOW PT'S PAIN GOAL Description: <4 on a 0-10 pain scale Outcome: Progressing   Problem: RH KNOWLEDGE DEFICIT GENERAL Goal: RH STG INCREASE KNOWLEDGE OF SELF CARE AFTER HOSPITALIZATION Description: Patient will demonstrate knowledge of medication management skin/wound care management, ostomy care management with educational materials and handouts with cues and reminders from staff. Outcome: Progressing

## 2020-05-09 NOTE — Progress Notes (Signed)
Danny Walker PHYSICAL MEDICINE & REHABILITATION PROGRESS NOTE   Subjective/Complaints:  Pt reports feels weakness and tingling on RLE form knee down.  B/L feet swelling.  Not using IV and wants it out.  Needs colostomy emptied.  No pain, no nausea.   ROS:  Pt denies SOB, abd pain, CP, N/V/C/D, and vision changes   Objective:   No results found. Recent Labs    05/07/20 0340 05/09/20 0438  WBC 6.6 9.0  HGB 9.4* 9.5*  HCT 29.8* 28.9*  PLT 207 287   Recent Labs    05/09/20 0438  CREATININE 0.85    Intake/Output Summary (Last 24 hours) at 05/09/2020 1721 Last data filed at 05/09/2020 1602 Gross per 24 hour  Intake 388 ml  Output 1700 ml  Net -1312 ml        Physical Exam: Vital Signs Blood pressure 105/69, pulse 88, temperature 99.2 F (37.3 C), temperature source Oral, resp. rate 16, height 5\' 7"  (1.702 m), SpO2 98 %.  Physical Exam Vitals reviewed.  Constitutional:  pt in bed, supine- interpretor at bedside, appropriate, NAD HENT: conjugate gaze Cardiovascular: RRR Pulmonary: CTA B/L- no W/R/R- good air movement Abdominal: Soft, NT, ND, (+)BS     Comments: Abdominal wound vertical staples intact- no redness or drainage-  and colostomy in place - 3/4 full Musculoskeletal:     Cervical back: Normal range of motion and neck supple.     Comments: No edema or tenderness in extremities  Skin:    General: Skin is warm and dry. abd incision looks great  Neurological:     Mental Status: He is alert.     Comments: Alert Motor: 4+/5 throughout  Psychiatric:   appropriate, Spanish is first language   Assessment/Plan: 1. Functional deficits which require 3+ hours per day of interdisciplinary therapy in a comprehensive inpatient rehab setting.  Physiatrist is providing close team supervision and 24 hour management of active medical problems listed below.  Physiatrist and rehab team continue to assess barriers to discharge/monitor patient progress toward  functional and medical goals  Care Tool:  Bathing    Body parts bathed by patient: Right arm,Left arm,Chest,Abdomen,Front perineal area,Buttocks,Right upper leg,Left upper leg,Face   Body parts bathed by helper: Left lower leg,Right lower leg     Bathing assist Assist Level: Minimal Assistance - Patient > 75%     Upper Body Dressing/Undressing Upper body dressing   What is the patient wearing?: Pull over shirt    Upper body assist Assist Level: Set up assist    Lower Body Dressing/Undressing Lower body dressing      What is the patient wearing?: Pants     Lower body assist Assist for lower body dressing: Minimal Assistance - Patient > 75%     Toileting Toileting    Toileting assist Assist for toileting: Minimal Assistance - Patient > 75% (standing to urinate with no device)     Transfers Chair/bed transfer  Transfers assist     Chair/bed transfer assist level: Minimal Assistance - Patient > 75%     Locomotion Ambulation   Ambulation assist      Assist level: Moderate Assistance - Patient 50 - 74% Assistive device: No Device Max distance: 10'   Walk 10 feet activity   Assist     Assist level: Minimal Assistance - Patient > 75% Assistive device: Hand held assist   Walk 50 feet activity   Assist    Assist level: Moderate Assistance - Patient - 50 - 74%  Assistive device: Hand held assist    Walk 150 feet activity   Assist Walk 150 feet activity did not occur: Safety/medical concerns (RLE trembling/fatigued)         Walk 10 feet on uneven surface  activity   Assist Walk 10 feet on uneven surfaces activity did not occur: Safety/medical concerns (RLE trembling/fatigue)         Wheelchair     Assist Will patient use wheelchair at discharge?: No      Wheelchair assist level: Supervision/Verbal cueing Max wheelchair distance: 50    Wheelchair 50 feet with 2 turns activity    Assist    Wheelchair 50 feet with 2  turns activity did not occur: Safety/medical concerns (fatigue)       Wheelchair 150 feet activity     Assist  Wheelchair 150 feet activity did not occur: Safety/medical concerns (fatigue)       Blood pressure 105/69, pulse 88, temperature 99.2 F (37.3 C), temperature source Oral, resp. rate 16, height 5\' 7"  (1.702 m), SpO2 98 %.  Medical Problem List and Plan: 1.  Debility secondary to perforated diverticulitis complicated by septic shock.  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  Wound care nurse follow-up for colostomy care and education             -patient may not shower             -ELOS/Goals: 4-6 days/mod I  12/18- will remove IV since not using             Admit to CIR 2.  Antithrombotics: -DVT/anticoagulation: Lovenox             -antiplatelet therapy: N/A 3. Pain Management: Oxycodone as needed  12/18- pt denies pain currently- con't regimen as required 4. Mood: Provide emotional support             -antipsychotic agents: N/A 5. Neuropsych: This patient is capable of making decisions on his own behalf. 6. Skin/Wound Care: Routine skin checks  12/18- will need staples out soon- will check when due to come out.  7. Fluids/Electrolytes/Nutrition: Routine in and outs             CMP ordered. 8. Acute blood anemia             Hemoglobin 9.4 on 12/16, CBC ordered for tomorrow 9.  Hypertension.  Lisinopril 40 mg daily, Norvasc 5 mg daily.    12/18- BP controlled- con't regimen             Monitor increase mobility 10.  Myasthenia gravis.  Prednisone 20 mg twice daily and Mestinon 30 mg every 8 hours.  Follow neurology services for management 11.  BPH.  Ditropan 5 mg daily, Proscar 5 mg daily             Monitor for retention 12.  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  SSI             Monitor with increased mobility  13. 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

## 2020-05-09 NOTE — Evaluation (Signed)
Occupational Therapy Assessment and Plan  Patient Details  Name: Danny Walker MRN: [id number] Date of Birth: 1946-01-19  OT Diagnosis: abnormal posture, acute pain and muscle weakness (generalized) Rehab Potential: Rehab Potential (ACUTE ONLY): Excellent ELOS: 10-12 days   Today's Date: 05/09/2020 OT Individual Time: 5638-9373 OT Individual Time Calculation (min): 75 min     Hospital Problem: Principal Problem:   Debility   Past Medical History:  Past Medical History:  Diagnosis Date  . BPH (benign prostatic hyperplasia)   . DDD (degenerative disc disease), lumbosacral   . Fatty liver   . Foley catheter in place   . History of gallstones   . History of prostatitis   . Hypertension   . IDA (iron deficiency anemia)   . Liver mass 09/17/2001   4.7cm , noted on [place] abd  . Low back pain   . Myasthenia gravis ([place])   . Nocturia   . 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 Spondylosis Lumbar  . Umbilical hernia   . Urinary retention 02/08/2018  . Weakness of both legs 02/2020   Past Surgical History:  Past Surgical History:  Procedure Laterality Date  . COLON RESECTION SIGMOID N/A 05/01/2020   Procedure: COLON RESECTION SIGMOID;  Surgeon: Donnie Mesa, MD;  Location: [facility];  Service: General;  Laterality: N/A;  . COLOSTOMY N/A 05/01/2020   Procedure: COLOSTOMY;  Surgeon: Donnie Mesa, MD;  Location: [place];  Service: General;  Laterality: N/A;  . ERCP W/ SPHICTEROTOMY  09/17/2001  . INSERTION OF MESH N/A 02/05/2016   Procedure: INSERTION OF MESH, UMBILICAL HERNIA;  Surgeon: Armandina Gemma, MD;  Location: [facility];  Service: General;  Laterality: N/A;  . LAPAROSCOPIC CHOLECYSTECTOMY  09/22/2001  . LAPAROTOMY N/A 05/01/2020   Procedure: EXPLORATORY LAPAROTOMY;  Surgeon: Donnie Mesa, MD;  Location: [facility];  Service: General;  Laterality: N/A;  . TRANSURETHRAL RESECTION OF PROSTATE N/A 03/05/2018   Procedure: TRANSURETHRAL  RESECTION OF THE PROSTATE (TURP);  Surgeon: Cleon Gustin, MD;  Location: [facility];  Service: Urology;  Laterality: N/A;  . UMBILICAL HERNIA REPAIR N/A 02/05/2016   Procedure: UMBILICAL HERNIA REPAIR WITH MESH PATCH;  Surgeon: Armandina Gemma, MD;  Location: [place];  Service: General;  Laterality: N/A;    Assessment & Plan Clinical Impression: Patient is a 74 y.o. year old male with recent admission to the hospital on 05/01/2020 with extreme constipation as well as abdominal pain.  No nausea vomiting.  In the ED he was hypotensive as well as tachycardic. He did receive a bolus of IV fluids.  Noted low-grade fever 100.1.  Underwent CT scan that revealed diverticulitis with retroperitoneal free air some of the pelvis.  No abscess or phlegmon noted..  Patient transferred to CIR on 05/08/2020 .    Patient currently requires min with basic self-care skills secondary to muscle weakness, decreased cardiorespiratoy endurance and decreased standing balance and decreased balance strategies.  Prior to hospitalization, patient could complete ADLs with independent .  Patient will benefit from skilled intervention to decrease level of assist with basic self-care skills and increase independence with basic self-care skills prior to discharge home independently.  Anticipate patient will require intermittent supervision and follow up home health.  OT - End of Session Activity Tolerance: Tolerates 10 - 20 min activity with multiple rests Endurance Deficit: Yes Endurance Deficit Description: dyspneic after up/down 12 steps or gait x 100' OT Assessment Rehab Potential (  ACUTE ONLY): Excellent OT Patient demonstrates impairments in the following area(s): Balance;Endurance;Edema;Sensory;Pain OT Basic ADL's Functional Problem(s): Grooming;Bathing;Dressing;Toileting OT Advanced ADL's Functional Problem(s): Simple Meal Preparation;Laundry OT Transfers Functional Problem(s):  Toilet;Tub/Shower OT Additional Impairment(s): None OT Plan OT Intensity: Minimum of 1-2 x/day, 45 to 90 minutes OT Frequency: 5 out of 7 days OT Duration/Estimated Length of Stay: 10-12 days OT Treatment/Interventions: Balance/vestibular training;Discharge planning;Pain management;Self Care/advanced ADL retraining;Therapeutic Activities;UE/LE Coordination activities;Functional mobility training;Patient/family education;Therapeutic Exercise;Community reintegration;DME/adaptive equipment instruction;Neuromuscular re-education;Psychosocial support;UE/LE Strength taining/ROM;Skin care/wound managment OT Self Feeding Anticipated Outcome(s): independent OT Basic Self-Care Anticipated Outcome(s): modified independent OT Toileting Anticipated Outcome(s): modified independent OT Bathroom Transfers Anticipated Outcome(s): supervision to modified independent OT Recommendation Patient destination: Home Follow Up Recommendations: Home health OT Equipment Recommended: To be determined   OT Evaluation Precautions/Restrictions  Precautions Precautions: Fall;Other (comment) Precaution Comments: colostomy Restrictions Weight Bearing Restrictions: No   Vital Signs Therapy Vitals Temp: 99.2 F (37.3 C) Temp Source: Oral Pulse Rate: 88 Resp: 16 BP: 105/69 Patient Position (if appropriate): Sitting Oxygen Therapy SpO2: 98 % O2 Device: Room Air Pain Pain Assessment Pain Scale: Faces Pain Score: 0-No pain (at rest; grimaces with movement) Home Living/Prior [place] expects to be discharged to:: Private residence Living Arrangements: Alone Available Help at Discharge: Family (daughter checks on him, unsure if she can provide 24 hr initially if needed) Type of Home: House Home Access: Ramped entrance Home Layout: One level Bathroom Shower/Tub: Optometrist: Yes  Lives With: Alone IADL History Homemaking  Responsibilities: (P) Yes Meal Prep Responsibility: (P) Primary Laundry Responsibility: (P) Primary Shopping Responsibility: (P) Primary Current License: (P) Yes Prior Function Level of Independence: Independent with basic ADLs,Independent with gait  Able to Take Stairs?: No (difficulty recently with knees buckling) Driving: Yes Comments: Has been having difficulty with LE strength recently so not working at this time 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 Drives. Reports 1 fall recently in shower. Vision Baseline Vision/History: Wears glasses Wears Glasses: At all times Patient Visual Report: No change from baseline Vision Assessment?: No apparent visual deficits Perception  Perception: Within Functional Limits Praxis Praxis: Intact Cognition Overall Cognitive Status: Within Functional Limits for tasks assessed Arousal/Alertness: Awake/alert Orientation Level: Person;Place;Situation Person: Oriented Place: Oriented Situation: Oriented Year: 2021 Month: December Day of Week: Correct Memory: Appears intact Immediate Memory Recall: Sock;Blue;Bed Memory Recall Sock: Without Cue Memory Recall Blue: With Cue Memory Recall Bed: Without Cue Attention: Sustained;Selective Sustained Attention: Appears intact Selective Attention: Appears intact Awareness: Appears intact Problem Solving: Appears intact Safety/Judgment: Appears intact Sensation Sensation Light Touch: Appears Intact Hot/Cold: Appears Intact Proprioception: Appears Intact Stereognosis: Appears Intact Additional Comments: Sensation intact in BUEs, but pt reports some tingling in his LEs and feet. Coordination Gross Motor Movements are Fluid and Coordinated: Yes Fine Motor Movements are Fluid and Coordinated: Yes Coordination and Movement Description: BUE coordination WFLs for selfcare tasks. Heel Shin Test: decreased excursion bil Motor  Motor Motor: Other  (comment) Motor - Skilled Clinical Observations: generalized weakness; limited musclular endurance RLE  Trunk/Postural Assessment  Cervical Assessment Cervical Assessment: Exceptions to [facility] (forward head) Thoracic Assessment Thoracic Assessment: Exceptions to [facility] (thoracic rounding) Lumbar Assessment Lumbar Assessment: Exceptions to [facility] (asymmetrical; L trunk shortened; scoliotic spine) Postural Control Postural Control: Deficits on evaluation (protective responses inadequate and unsafe; hx falls PTA)  Balance Balance Balance Assessed: Yes Static Sitting Balance Static Sitting - Balance Support: No upper extremity supported Static Sitting -  Level of Assistance: 5: Stand by assistance Dynamic Sitting Balance Dynamic Sitting - Balance Support: During functional activity Dynamic Sitting - Level of Assistance: 5: Stand by assistance Reach (Patient is able to reach ___ inches to right, left, forward, back): 6 Dynamic Sitting - Balance Activities: Forward lean/weight shifting Static Standing Balance Static Standing - Balance Support: During functional activity Static Standing - Level of Assistance: 4: Min assist Dynamic Standing Balance Dynamic Standing - Balance Support: No upper extremity supported;During functional activity Dynamic Standing - Level of Assistance: 4: Min assist Extremity/Trunk Assessment RUE Assessment RUE Assessment: Exceptions to [facility] Passive Range of Motion (PROM) Comments: WFLS for all joints Active Range of Motion (AROM) Comments: WFLs for all joints General Strength Comments: strength 4/5 shoulders 3+/5 all other joints LUE Assessment LUE Assessment: Exceptions to [facility] Passive Range of Motion (PROM) Comments: WFls for all joints Active Range of Motion (AROM) Comments: WFLs for all joints General Strength Comments: strength 4/5 shoulders 3+/5 all other joints  Care Tool Care Tool Self Care Eating   Eating Assist Level: Independent    Oral Care    Oral  Care Assist Level: Set up assist    Bathing   Body parts bathed by patient: Right arm;Left arm;Chest;Abdomen;Front perineal area;Buttocks;Right upper leg;Left upper leg;Face Body parts bathed by helper: Left lower leg;Right lower leg   Assist Level: Minimal Assistance - Patient > 75%    Upper Body Dressing(including orthotics)   What is the patient wearing?: Pull over shirt   Assist Level: Set up assist    Lower Body Dressing (excluding footwear)   What is the patient wearing?: Pants Assist for lower body dressing: Minimal Assistance - Patient > 75%    Putting on/Taking off footwear   What is the patient wearing?: Non-skid slipper socks;Ted hose Assist for footwear: Total Assistance - Patient < 25%       Care Tool Toileting Toileting activity   Assist for toileting: Minimal Assistance - Patient > 75% (standing to urinate with no device)     Care Tool Bed Mobility Roll left and right activity   Roll left and right assist level: Supervision/Verbal cueing    Sit to lying activity   Sit to lying assist level: Minimal Assistance - Patient > 75%    Lying to sitting edge of bed activity   Lying to sitting edge of bed assist level: Minimal Assistance - Patient > 75%     Care Tool Transfers Sit to stand transfer   Sit to stand assist level: Minimal Assistance - Patient > 75%    Chair/bed transfer   Chair/bed transfer assist level: Minimal Assistance - Patient > 75%     Toilet transfer   Assist Level: Minimal Assistance - Patient > 75% (ambulate without device)     Care Tool Cognition Expression of Ideas and Wants Expression of Ideas and Wants: Without difficulty (complex and basic) - expresses complex messages without difficulty and with speech that is clear and easy to understand   Understanding Verbal and Non-Verbal Content Understanding Verbal and Non-Verbal Content: Understands (complex and basic) - clear comprehension without cues or repetitions   Memory/Recall  Ability *first 3 days only Memory/Recall Ability *first 3 days only: Current season;Location of own room;Staff names and faces;That he or she is in a hospital/hospital unit    Refer to Care Plan for [place] 1 OT Short Term Goal 1 (Week 1): Pt will complete LB bathing and dressing with use of  AE and close supervision for two consecutive sessions. OT Short Term Goal 2 (Week 1): Pt will complete toilet transfers with close supervision to elevated toilet or 3:1. OT Short Term Goal 3 (Week 1): Pt will complete 3 grooming tasks in standing with supervision.  Recommendations for other services: None    Skilled Therapeutic Intervention ADL ADL Eating: Independent Where Assessed-Eating: Bed level Grooming: Setup Where Assessed-Grooming: Edge of bed Upper Body Bathing: Supervision/safety Where Assessed-Upper Body Bathing: Edge of bed Lower Body Bathing: Minimal assistance Where Assessed-Lower Body Bathing: Edge of bed Upper Body Dressing: Supervision/safety Where Assessed-Upper Body Dressing: Edge of bed Lower Body Dressing: Moderate assistance Where Assessed-Lower Body Dressing: Edge of bed Toileting: Minimal assistance Where Assessed-Toileting: Glass blower/designer: Psychiatric nurse Method: Arts development officer: Raised toilet seat Tub/Shower Transfer: Not assessed Social research officer, government: Not assessed Mobility  Bed Mobility Bed Mobility: Supine to Sit;Sit to Supine Supine to Sit: Minimal Assistance - Patient > 75% Sit to Supine: Minimal Assistance - Patient > 75% Transfers Sit to Stand: Minimal Assistance - Patient > 75% Stand to Sit: Minimal Assistance - Patient > 75%   Session Note 1: ([id number])  Pt needed min assist for transfer from supine to sit EOB.  Video interpreter was used for most of the session, as in person interpreter was not available initially.  He was able to complete all UB selfcare with setup but  exhibited decreased ability to reach either foot for bathing or dressing secondary to increased abdominal pain with bending.  Min assist was needed for sit to stand from the EOB and with washing buttocks and pulling items over his hips.  Pt with reports of increased RLE weakness below the knee as well as tingling with standing and completion of toilet transfers without an assistive device.  Finished session with pt in the bed and with the call button and phone in reach.  Safety belt in place as well.    Session Note 2; [phone])  Pt in recliner to start session, with NT present just finishing using the urinal.  Had him stand with min assist and ambulate to the sink in order to wash his hands.  He was then transported down to the therapy gym, where he transferred to the therapy mat at the same level.  Had him work on standing balance with feet together and apart as well as with eyes closed.  Used a 4" step as well to work on alternating to taps.  Also had him work on functional reaching in standing to various heights to pick up and place clothes pins with BUEs.  Overall min assist for balance with these tasks.  Had him complete functional mobility around the gym and also back to the room totaling over 150' with min assist and no device.  Finished session with pt sitting in the recliner with the call button and phone in reach and safety alarm pad in place.     Discharge Criteria: Patient will be discharged from OT if patient refuses treatment 3 consecutive times without medical reason, if treatment goals not met, if there is a change in medical status, if patient makes no progress towards goals or if patient is discharged from hospital.  The above assessment, treatment plan, treatment alternatives and goals were discussed and mutually agreed upon: by patient  Ho Parisi OTR/L 05/09/2020, 4:10 PM

## 2020-05-10 LAB — GLUCOSE, CAPILLARY
Glucose-Capillary: 121 mg/dL — ABNORMAL HIGH (ref 70–99)
Glucose-Capillary: 141 mg/dL — ABNORMAL HIGH (ref 70–99)
Glucose-Capillary: 179 mg/dL — ABNORMAL HIGH (ref 70–99)
Glucose-Capillary: 181 mg/dL — ABNORMAL HIGH (ref 70–99)
Glucose-Capillary: 92 mg/dL (ref 70–99)
Glucose-Capillary: 96 mg/dL (ref 70–99)

## 2020-05-10 NOTE — Progress Notes (Signed)
Inpatient Rehabilitation  Patient information reviewed and entered into eRehab system by Randell Teare M. Vinayak Bobier, M.A., CCC/SLP, PPS Coordinator.  Information including medical coding, functional ability and quality indicators will be reviewed and updated through discharge.    

## 2020-05-10 NOTE — Plan of Care (Signed)
  Problem: Consults Goal: RH GENERAL PATIENT EDUCATION Description: See Patient Education module for education specifics. 05/10/2020 0751 by Sanda Linger, LPN Outcome: Progressing 05/09/2020 1907 by Sanda Linger, LPN Outcome: Progressing Goal: Skin Care Protocol Initiated - if Braden Score 18 or less Description: If consults are not indicated, leave blank or document N/A 05/10/2020 0751 by Renda Rolls L, LPN Outcome: Progressing 05/09/2020 1907 by Sanda Linger, LPN Outcome: Progressing   Problem: RH BOWEL ELIMINATION Goal: RH STG MANAGE BOWEL WITH ASSISTANCE Description: STG Manage Bowel with mod I Assistance. 05/10/2020 0751 by Sanda Linger, LPN Outcome: Progressing 05/09/2020 1907 by Sanda Linger, LPN Outcome: Progressing   Problem: RH SKIN INTEGRITY Goal: RH STG SKIN FREE OF INFECTION/BREAKDOWN Description: Skin to remain free from additional breakdown with mod I assist while on rehab. 05/10/2020 0751 by Sanda Linger, LPN Outcome: Progressing 05/09/2020 1907 by Sanda Linger, LPN Outcome: Progressing Goal: RH STG MAINTAIN SKIN INTEGRITY WITH ASSISTANCE Description: STG Maintain Skin Integrity With mod I Assistance. 05/10/2020 0751 by Sanda Linger, LPN Outcome: Progressing 05/09/2020 1907 by Sanda Linger, LPN Outcome: Progressing Goal: RH STG ABLE TO PERFORM INCISION/WOUND CARE W/ASSISTANCE Description: STG Able To Perform Incision/Wound Care With mod I Assistance. 05/10/2020 0751 by Sanda Linger, LPN Outcome: Progressing 05/09/2020 1907 by Sanda Linger, LPN Outcome: Progressing   Problem: RH SAFETY Goal: RH STG ADHERE TO SAFETY PRECAUTIONS W/ASSISTANCE/DEVICE Description: STG Adhere to Safety Precautions With mod I Assistance/Device. 05/10/2020 0751 by Sanda Linger, LPN Outcome: Progressing 05/09/2020 1907 by Sanda Linger, LPN Outcome: Progressing   Problem: RH PAIN MANAGEMENT Goal: RH STG PAIN MANAGED AT OR BELOW PT'S PAIN  GOAL Description: <4 on a 0-10 pain scale 05/10/2020 0751 by Renda Rolls L, LPN Outcome: Progressing 05/09/2020 1907 by Sanda Linger, LPN Outcome: Progressing   Problem: RH KNOWLEDGE DEFICIT GENERAL Goal: RH STG INCREASE KNOWLEDGE OF SELF CARE AFTER HOSPITALIZATION Description: Patient will demonstrate knowledge of medication management skin/wound care management, ostomy care management with educational materials and handouts with cues and reminders from staff. 05/10/2020 0751 by Sanda Linger, LPN Outcome: Progressing 05/09/2020 1907 by Sanda Linger, LPN Outcome: Progressing

## 2020-05-10 NOTE — Consult Note (Signed)
[place] [facility] Nurse Consult Note: Reason for Consult: Patient consult received from PM&R provider.  Patient has been seen while in acute care by my partner, S. Doty.  Next planned visit is for Monday or Tuesday or this week.  [place] nursing team will follow for continued education and support and will remain available to this patient, the nursing and medical teams.  Thanks, Maudie Flakes, MSN, RN, [place], Arther Abbott  Pager# [phone]

## 2020-05-10 NOTE — Progress Notes (Signed)
[place] PHYSICAL MEDICINE & REHABILITATION PROGRESS NOTE   Subjective/Complaints:  Pt reports needs colostomy changed- had for awhile- also said hadn't ordered breakfast/lunch, but breakfast tray in room.   No pain- even from IV that was removed.     ROS:  Pt denies SOB, abd pain, CP, N/V/C/D, and vision changes   Objective:   No results found. Recent Labs    05/09/20 0438  WBC 9.0  HGB 9.5*  HCT 28.9*  PLT 287   Recent Labs    05/09/20 0438  CREATININE 0.85    Intake/Output Summary (Last 24 hours) at 05/10/2020 1424 Last data filed at 05/10/2020 1258 Gross per 24 hour  Intake 832 ml  Output 1375 ml  Net -543 ml        Physical Exam: Vital Signs Blood pressure 139/78, pulse 65, temperature 98 F (36.7 C), temperature source Oral, resp. rate 20, height 5\' 7"  (1.702 m), weight 74.3 kg, SpO2 96 %.  Physical Exam Vitals reviewed.  Constitutional:  spoke to pt by tele-intepretor- supine in bed, NAD HENT: conjugate gaze Cardiovascular: RRR Pulmonary: CTA B/L- no W/R/R- good air movement Abdominal: Soft, NT, ND, (+)BS     Comments: Abdominal wound vertical staples intact- no redness or drainage-  and colostomy in place - 1/4 full Musculoskeletal:     Cervical back: Normal range of motion and neck supple.     Comments: No edema or tenderness in extremities  Skin:    General: Skin is warm and dry. abd incision looks great  Neurological:     Mental Status: He is alert.     Comments: Alert Motor: 4+/5 throughout  Psychiatric:   appropriate, Spanish is first language   Assessment/Plan: 1. Functional deficits which require 3+ hours per day of interdisciplinary therapy in a comprehensive inpatient rehab setting.  Physiatrist is providing close team supervision and 24 hour management of active medical problems listed below.  Physiatrist and rehab team continue to assess barriers to discharge/monitor patient progress toward functional and medical  goals  Care Tool:  Bathing    Body parts bathed by patient: Right arm,Left arm,Chest,Abdomen,Front perineal area,Buttocks,Right upper leg,Left upper leg,Face   Body parts bathed by helper: Left lower leg,Right lower leg     Bathing assist Assist Level: Minimal Assistance - Patient > 75%     Upper Body Dressing/Undressing Upper body dressing   What is the patient wearing?: Pull over shirt    Upper body assist Assist Level: Set up assist    Lower Body Dressing/Undressing Lower body dressing      What is the patient wearing?: Pants     Lower body assist Assist for lower body dressing: Minimal Assistance - Patient > 75%     Toileting Toileting    Toileting assist Assist for toileting: Independent with assistive device (urinal)     Transfers Chair/bed transfer  Transfers assist     Chair/bed transfer assist level: Minimal Assistance - Patient > 75%     Locomotion Ambulation   Ambulation assist      Assist level: Moderate Assistance - Patient 50 - 74% Assistive device: No Device Max distance: 10'   Walk 10 feet activity   Assist     Assist level: Minimal Assistance - Patient > 75% Assistive device: Hand held assist   Walk 50 feet activity   Assist    Assist level: Moderate Assistance - Patient - 50 - 74% Assistive device: Hand held assist    Walk 150 feet activity  Assist Walk 150 feet activity did not occur: Safety/medical concerns (RLE trembling/fatigued)         Walk 10 feet on uneven surface  activity   Assist Walk 10 feet on uneven surfaces activity did not occur: Safety/medical concerns (RLE trembling/fatigue)         Wheelchair     Assist Will patient use wheelchair at discharge?: No      Wheelchair assist level: Supervision/Verbal cueing Max wheelchair distance: 50    Wheelchair 50 feet with 2 turns activity    Assist    Wheelchair 50 feet with 2 turns activity did not occur: Safety/medical concerns  (fatigue)       Wheelchair 150 feet activity     Assist  Wheelchair 150 feet activity did not occur: Safety/medical concerns (fatigue)       Blood pressure 139/78, pulse 65, temperature 98 F (36.7 C), temperature source Oral, resp. rate 20, height 5\' 7"  (1.702 m), weight 74.3 kg, SpO2 96 %.  Medical Problem List and Plan: 1.  Debility secondary to perforated diverticulitis complicated by septic shock.  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  Wound care nurse follow-up for colostomy care and education             -patient may not shower             -ELOS/Goals: 4-6 days/mod I  12/18- will remove IV since not using             Admit to CIR 2.  Antithrombotics: -DVT/anticoagulation: Lovenox             -antiplatelet therapy: N/A 3. Pain Management: Oxycodone as needed  12/19- pain controlled- con't regimen 4. Mood: Provide emotional support             -antipsychotic agents: N/A 5. Neuropsych: This patient is capable of making decisions on his own behalf. 6. Skin/Wound Care: Routine skin checks  12/18- will need staples out soon- will check when due to come out.  7. Fluids/Electrolytes/Nutrition: Routine in and outs             CMP ordered. 8. Acute blood anemia             Hemoglobin 9.4 on 12/16, CBC ordered for tomorrow 9.  Hypertension.  Lisinopril 40 mg daily, Norvasc 5 mg daily.    112/19- BP controlled- con't regimen             Monitor increase mobility 10.  Myasthenia gravis.  Prednisone 20 mg twice daily and Mestinon 30 mg every 8 hours.  Follow neurology services for management 11.  BPH.  Ditropan 5 mg daily, Proscar 5 mg daily             Monitor for retention 12.  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

## 2020-05-11 ENCOUNTER — Inpatient Hospital Stay (HOSPITAL_COMMUNITY): Payer: Medicare Other

## 2020-05-11 DIAGNOSIS — D62 Acute posthemorrhagic anemia: Secondary | ICD-10-CM

## 2020-05-11 DIAGNOSIS — I10 Essential (primary) hypertension: Secondary | ICD-10-CM

## 2020-05-11 DIAGNOSIS — G7000 Myasthenia gravis without (acute) exacerbation: Secondary | ICD-10-CM

## 2020-05-11 LAB — COMPREHENSIVE METABOLIC PANEL
ALT: 30 U/L (ref 0–44)
AST: 14 U/L — ABNORMAL LOW (ref 15–41)
Albumin: 2.4 g/dL — ABNORMAL LOW (ref 3.5–5.0)
Alkaline Phosphatase: 95 U/L (ref 38–126)
Anion gap: 10 (ref 5–15)
BUN: 19 mg/dL (ref 8–23)
CO2: 23 mmol/L (ref 22–32)
Calcium: 9 mg/dL (ref 8.9–10.3)
Chloride: 107 mmol/L (ref 98–111)
Creatinine, Ser: 0.88 mg/dL (ref 0.61–1.24)
GFR, Estimated: >60 mL/min (ref >60)
Glucose, Bld: 100 mg/dL — ABNORMAL HIGH (ref 70–99)
Potassium: 4 mmol/L (ref 3.5–5.1)
Sodium: 140 mmol/L (ref 135–145)
Total Bilirubin: 0.6 mg/dL (ref 0.3–1.2)
Total Protein: 5.1 g/dL — ABNORMAL LOW (ref 6.5–8.1)

## 2020-05-11 LAB — CBC WITH DIFFERENTIAL/PLATELET
Abs Immature Granulocytes: 0.33 10*3/uL — ABNORMAL HIGH (ref 0.00–0.07)
Basophils Absolute: 0 10*3/uL (ref 0.0–0.1)
Basophils Relative: 0 %
Eosinophils Absolute: 0 10*3/uL (ref 0.0–0.5)
Eosinophils Relative: 0 %
HCT: 31.9 % — ABNORMAL LOW (ref 39.0–52.0)
Hemoglobin: 9.9 g/dL — ABNORMAL LOW (ref 13.0–17.0)
Immature Granulocytes: 3 %
Lymphocytes Relative: 10 %
Lymphs Abs: 1.1 10*3/uL (ref 0.7–4.0)
MCH: 29.2 pg (ref 26.0–34.0)
MCHC: 31 g/dL (ref 30.0–36.0)
MCV: 94.1 fL (ref 80.0–100.0)
Monocytes Absolute: 0.5 10*3/uL (ref 0.1–1.0)
Monocytes Relative: 4 %
Neutro Abs: 9.5 10*3/uL — ABNORMAL HIGH (ref 1.7–7.7)
Neutrophils Relative %: 83 %
Platelets: 330 10*3/uL (ref 150–400)
RBC: 3.39 MIL/uL — ABNORMAL LOW (ref 4.22–5.81)
RDW: 17.2 % — ABNORMAL HIGH (ref 11.5–15.5)
WBC: 11.4 10*3/uL — ABNORMAL HIGH (ref 4.0–10.5)
nRBC: 0.4 % — ABNORMAL HIGH (ref 0.0–0.2)

## 2020-05-11 LAB — GLUCOSE, CAPILLARY
Glucose-Capillary: 144 mg/dL — ABNORMAL HIGH (ref 70–99)
Glucose-Capillary: 95 mg/dL (ref 70–99)
Glucose-Capillary: 151 mg/dL — ABNORMAL HIGH (ref 70–99)
Glucose-Capillary: 72 mg/dL (ref 70–99)
Glucose-Capillary: 150 mg/dL — ABNORMAL HIGH (ref 70–99)
Glucose-Capillary: 136 mg/dL — ABNORMAL HIGH (ref 70–99)
Glucose-Capillary: 155 mg/dL — ABNORMAL HIGH (ref 70–99)

## 2020-05-11 MED ORDER — PANTOPRAZOLE SODIUM 40 MG PO PACK
40.0000 mg | PACK | Freq: Every day | ORAL | Status: DC
  Administered 2020-05-11 – 2020-05-18 (×8): 40 mg via ORAL
  Filled 2020-05-11 (×3): qty 20

## 2020-05-11 NOTE — Progress Notes (Signed)
Occupational Therapy Session Note  Patient Details  Name: Danny Walker MRN: [id number] Date of Birth: 04/06/1946  Today's Date: 05/11/2020 OT Individual Time: 1100-1200 OT Individual Time Calculation (min): 60 min    Short Term Goals: Week 1:  OT Short Term Goal 1 (Week 1): Pt will complete LB bathing and dressing with use of AE and close supervision for two consecutive sessions. OT Short Term Goal 2 (Week 1): Pt will complete toilet transfers with close supervision to elevated toilet or 3:1. OT Short Term Goal 3 (Week 1): Pt will complete 3 grooming tasks in standing with supervision.  Skilled Therapeutic Interventions/Progress Updates:    Pt received sitting in recliner with no c/o pain. Interpretor present for session. Discussed ostomy care and "burping" of bag. Pt requesting to release air from and small amount of stool from ostomy. Pt used RW to complete ambulatory transfer into the bathroom with CGA. He sat on BSC with was able to manage ostomy care with min A. Min cueing provided for hygiene and technique. Pt washed hands at the sink with set up assist. Good RW management. Pt completed 150 ft of functional mobility with RW to the therapy gym with CGA overall. Pt completed standing level reciprocal stepping activity to challenge standing balance and functional activity tolerance. All VSS throughout session. Pt then stood and completed BUE strengthening circuit with a 3 lb dowel with intended carryover to IADLs and for improvement of functional activity tolerance. Pt then completed 100 ft of functional mobility to the ADL apt for discussion/demo re use of a TTB at home. Pt returned demo with CGA. Pt took rest break and then returned to the room, 150 ft, of functional mobility with the RW with CGA overall. Pt completed toileting in standing with CGA before returning to the recliner. He was left sitting in the recliner with all needs met, chair alarm set.   Therapy  Documentation Precautions:  Precautions Precautions: Fall,Other (comment) Precaution Comments: colostomy Restrictions Weight Bearing Restrictions: No   Therapy/Group: Individual Therapy  Sandra H Davis 05/11/2020, 7:22 AM  

## 2020-05-11 NOTE — Progress Notes (Signed)
Occupational Therapy Session Note  Patient Details  Name: Danny Walker MRN: [id number] Date of Birth: March 15, 1946  Today's Date: 05/11/2020 OT Individual Time: 1300-1400 OT Individual Time Calculation (min): 60 min    Short Term Goals: Week 1:  OT Short Term Goal 1 (Week 1): Pt will complete LB bathing and dressing with use of AE and close supervision for two consecutive sessions. OT Short Term Goal 2 (Week 1): Pt will complete toilet transfers with close supervision to elevated toilet or 3:1. OT Short Term Goal 3 (Week 1): Pt will complete 3 grooming tasks in standing with supervision.  Skilled Therapeutic Interventions/Progress Updates:    1;1. Pt received in reclienr with interpreter present. Pt completes all ambulation with MIN-CGA overall using RW. Pt completes toileting in standing with CGA fading to S for static stance after CM with VC for directing RW over toilet for urine void. Pt taken to ADL apartment to practice functional transfers to couch. Pt requires min-mod A to boost up from low couch. Pt provided with walker bag for energy conservation and transportation of items. Pt verbilized understanding of use and appreciative. Pt requesing to message MD/CSW about needing short term disability paperwork for work. Forwarded to MD and CSW to follow up. Exited session with pt seated in bed, exit alarm on and call light in reach   Therapy Documentation Precautions:  Precautions Precautions: Fall,Other (comment) Precaution Comments: colostomy Restrictions Weight Bearing Restrictions: No General:   Vital Signs: Therapy Vitals Temp: 98.1 F (36.7 C) Pulse Rate: 68 Resp: 20 BP: 108/62 Patient Position (if appropriate): Lying Oxygen Therapy SpO2: 95 % O2 Device: Room Air Pain:   ADL: ADL Eating: Independent Where Assessed-Eating: Bed level Grooming: Setup Where Assessed-Grooming: Edge of bed Upper Body Bathing: Supervision/safety Where Assessed-Upper Body Bathing:  Edge of bed Lower Body Bathing: Minimal assistance Where Assessed-Lower Body Bathing: Edge of bed Upper Body Dressing: Supervision/safety Where Assessed-Upper Body Dressing: Edge of bed Lower Body Dressing: Moderate assistance Where Assessed-Lower Body Dressing: Edge of bed Toileting: Minimal assistance Where Assessed-Toileting: Glass blower/designer: Psychiatric nurse Method: Arts development officer: Raised toilet seat Tub/Shower Transfer: Not assessed Social research officer, government: Not assessed Vision   Perception    Praxis   Exercises:   Other Treatments:     Therapy/Group: Individual Therapy  Tonny Branch 05/11/2020, 7:55 AM

## 2020-05-11 NOTE — Progress Notes (Signed)
[place] PHYSICAL MEDICINE & REHABILITATION PROGRESS NOTE   Subjective/Complaints:  ovreall seems to be doing fairly well. Asked if his toe nails could be cut while he's here. Pain levels are reasonable. He's been able to sleep  ROS: Patient denies fever, rash, sore throat, blurred vision, nausea, vomiting, diarrhea, cough, shortness of breath or chest pain,  headache, or mood change.   Objective:   No results found. Recent Labs    05/09/20 0438 05/11/20 0708  WBC 9.0 11.4*  HGB 9.5* 9.9*  HCT 28.9* 31.9*  PLT 287 330   Recent Labs    05/09/20 0438 05/11/20 0708  NA  --  140  K  --  4.0  CL  --  107  CO2  --  23  GLUCOSE  --  100*  BUN  --  19  CREATININE 0.85 0.88  CALCIUM  --  9.0    Intake/Output Summary (Last 24 hours) at 05/11/2020 1201 Last data filed at 05/11/2020 0730 Gross per 24 hour  Intake 683 ml  Output 1750 ml  Net -1067 ml        Physical Exam: Vital Signs Blood pressure 112/66, pulse 68, temperature 98.1 F (36.7 C), resp. rate 20, height 5\' 7"  (1.702 m), weight 74.3 kg, SpO2 95 %.  Physical Exam Constitutional: No distress . Vital signs reviewed. HEENT: EOMI, oral membranes moist Neck: supple Cardiovascular: RRR without murmur. No JVD    Respiratory/Chest: CTA Bilaterally without wheezes or rales. Normal effort    GI/Abdomen: BS +, abd incision CDI. Ostomy intact with formed stool inside Ext: no clubbing, cyanosis, or edema Psych: pleasant and cooperative  Musculoskeletal:     normal cervical and lumbar rom.    Comments: No edema or tenderness in extremities  Skin:    General: Skin is warm and dry. Toe nails are long  Neurological: cognitively appears appropriate    Mental Status: He is alert.     Comments: Alert Motor: 4+/5 UE and LE, perhaps a little weaker proximally in LE's   Assessment/Plan: 1. Functional deficits which require 3+ hours per day of interdisciplinary therapy in a comprehensive inpatient rehab  setting.  Physiatrist is providing close team supervision and 24 hour management of active medical problems listed below.  Physiatrist and rehab team continue to assess barriers to discharge/monitor patient progress toward functional and medical goals  Care Tool:  Bathing    Body parts bathed by patient: Right arm,Left arm,Chest,Abdomen,Front perineal area,Buttocks,Right upper leg,Left upper leg,Face   Body parts bathed by helper: Left lower leg,Right lower leg     Bathing assist Assist Level: Minimal Assistance - Patient > 75%     Upper Body Dressing/Undressing Upper body dressing   What is the patient wearing?: Pull over shirt    Upper body assist Assist Level: Set up assist    Lower Body Dressing/Undressing Lower body dressing      What is the patient wearing?: Pants     Lower body assist Assist for lower body dressing: Minimal Assistance - Patient > 75%     Toileting Toileting    Toileting assist Assist for toileting: Contact Guard/Touching assist     Transfers Chair/bed transfer  Transfers assist     Chair/bed transfer assist level: Minimal Assistance - Patient > 75%     Locomotion Ambulation   Ambulation assist      Assist level: Moderate Assistance - Patient 50 - 74% Assistive device: No Device Max distance: 10'   Walk 10 feet  activity   Assist     Assist level: Minimal Assistance - Patient > 75% Assistive device: Hand held assist   Walk 50 feet activity   Assist    Assist level: Moderate Assistance - Patient - 50 - 74% Assistive device: Hand held assist    Walk 150 feet activity   Assist Walk 150 feet activity did not occur: Safety/medical concerns (RLE trembling/fatigued)         Walk 10 feet on uneven surface  activity   Assist Walk 10 feet on uneven surfaces activity did not occur: Safety/medical concerns (RLE trembling/fatigue)         Wheelchair     Assist Will patient use wheelchair at discharge?:  No      Wheelchair assist level: Supervision/Verbal cueing Max wheelchair distance: 50    Wheelchair 50 feet with 2 turns activity    Assist    Wheelchair 50 feet with 2 turns activity did not occur:  (fatigue)       Wheelchair 150 feet activity     Assist  Wheelchair 150 feet activity did not occur:  (fatigue)       Blood pressure 112/66, pulse 68, temperature 98.1 F (36.7 C), resp. rate 20, height 5\' 7"  (1.702 m), weight 74.3 kg, SpO2 95 %.  Medical Problem List and Plan: 1.  Debility secondary to perforated diverticulitis complicated by septic shock.  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  Wound care nurse follow-up for colostomy care and education             -patient may not shower             -ELOS/Goals: 4-6 days/mod I  -continue therapies. Pt appears motivated 2.  Antithrombotics: -DVT/anticoagulation: Lovenox             -antiplatelet therapy: N/A 3. Pain Management: Oxycodone as needed  12/20- pain controlled- con't regimen 4. Mood: Provide emotional support             -antipsychotic agents: N/A 5. Neuropsych: This patient is capable of making decisions on his own behalf. 6. Skin/Wound Care: Routine skin checks  12/20 incision looks great---staples out soon  -no podiatry here at hospital 7. Fluids/Electrolytes/Nutrition: encourage PO             CMP reviewed 12/20 and other than albumin/protein looks ok 8. Acute blood anemia             Hemoglobin 9.4 on 12/16---9.9 12/20 9.  Hypertension.  Lisinopril 40 mg daily, Norvasc 5 mg daily.    12/20- BP controlled- con't regimen             Monitor increase mobility 10.  Myasthenia gravis.  Prednisone 20 mg twice daily and Mestinon 30 mg every 8 hours.  Follow neurology services for management  -wbc's up d/t steroids 11.  BPH.  Ditropan 5 mg daily, Proscar 5 mg daily             Monitor for retention 12.  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  SSI              Monitor with increased mobility  13. 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

## 2020-05-11 NOTE — IPOC Note (Signed)
Overall Plan of Care [facility]) Patient Details Name: Javanni Maring MRN: [id number] DOB: May 12, 1946  Admitting Diagnosis: Debility  Hospital Problems: Principal Problem:   Debility     Functional Problem List: Nursing Bowel,Edema,Endurance,Medication Management  PT Balance,Pain,Edema,Endurance,Motor,Sensory  OT Balance,Endurance,Edema,Sensory,Pain  SLP    TR         Basic ADL's: OT Grooming,Bathing,Dressing,Toileting     Advanced  ADL's: OT Simple Meal Preparation,Laundry     Transfers: PT Bed Mobility,Bed to Chair,Car,Furniture  OT Toilet,Tub/Shower     Locomotion: PT Ambulation,Stairs     Additional Impairments: OT None  SLP        TR      Anticipated Outcomes Item Anticipated Outcome  Self Feeding independent  Swallowing      Basic self-care  modified independent  Toileting  modified independent   Bathroom Transfers supervision to modified independent  Bowel/Bladder  mod I  Transfers  modified independent basic and supervision car  Locomotion  LRAD for all: modified independent household ambulation x 50'; Supervision community ambulation x 150'; supervision up/down ramp for home entry  Communication     Cognition     Pain  <3  Safety/Judgment  Mod I   Therapy Plan: PT Intensity: Minimum of 1-2 x/day ,45 to 90 minutes PT Frequency: 5 out of 7 days PT Duration Estimated Length of Stay: 10-12 OT Intensity: Minimum of 1-2 x/day, 45 to 90 minutes OT Frequency: 5 out of 7 days OT Duration/Estimated Length of Stay: 10-12 days     Due to the current state of emergency, patients may not be receiving their 3-hours of Medicare-mandated therapy.   Team Interventions: Nursing Interventions Patient/Family Education,Bowel Management,Disease Management/Prevention,Pain Management,Medication Management,Skin Care/Wound Management,Discharge Planning,Psychosocial Support  PT interventions Ambulation/gait training,Discharge planning,DME/adaptive equipment  instruction,Functional mobility training,Pain management,Psychosocial support,Therapeutic Activities,UE/LE Strength taining/ROM,Balance/vestibular training,Community reintegration,Neuromuscular re-education,Patient/family education,Stair training,Therapeutic Exercise,UE/LE Museum/gallery conservator propulsion/positioning  OT Interventions Balance/vestibular training,Discharge planning,Pain management,Self Care/advanced ADL retraining,Therapeutic Activities,UE/LE Coordination activities,Functional mobility training,Patient/family education,Therapeutic Exercise,Community reintegration,DME/adaptive equipment instruction,Neuromuscular re-education,Psychosocial support,UE/LE Strength taining/ROM,Skin care/wound managment  SLP Interventions    TR Interventions    SW/CM Interventions Discharge Planning,Psychosocial Support,Patient/Family Education   Barriers to Discharge MD  Medical stability  Nursing Decreased caregiver support,Home environment access/layout,Wound Care,Lack of/limited family support,Nutrition means    PT      OT      SLP      SW       Team Discharge Planning: Destination: PT-Home ,OT- Home , SLP-  Projected Follow-up: PT-Home health PT, OT-  Home health OT, SLP-  Projected Equipment Needs: PT-Rolling walker with 5" wheels, OT- To be determined, SLP-  Equipment Details: PT-wife owned a walker and cane; type unknown, OT-  Patient/family involved in discharge planning: PT- Patient (pt stated that he would like to be home by Christmas Eve),  OT-Patient, SLP-   MD ELOS: 10-12 days Medical Rehab Prognosis:  Excellent Assessment: The patient has been admitted for CIR therapies with the diagnosis of debility after sigmoid colectomy/colostomy, sepsis, multiple med-surg complications. The team will be addressing functional mobility, strength, stamina, balance, safety, adaptive techniques and equipment, self-care, bowel and bladder mgt, patient and caregiver education, pain mgt,  ostomy care, community reentry. Goals have been set at mod I for mobility and self-care tasks, ostomy care.   Due to the current state of emergency, patients may not be receiving their 3 hours per day of Medicare-mandated therapy.    Meredith Staggers, MD, [facility]      See Team Conference Notes for weekly updates  to the plan of care

## 2020-05-11 NOTE — Care Management (Signed)
[place] Individual Statement of Services  Patient Name:  Meko Masterson  Date:  05/11/2020  Welcome to the [facility].  Our goal is to provide you with an individualized program based on your diagnosis and situation, designed to meet your specific needs.  With this comprehensive rehabilitation program, you will be expected to participate in at least 3 hours of rehabilitation therapies Monday-Friday, with modified therapy programming on the weekends.  Your rehabilitation program will include the following services:  Physical Therapy (PT), Occupational Therapy (OT), 24 hour per day rehabilitation nursing, Therapeutic Recreaction (TR), Psychology, Neuropsychology, Care Coordinator, Rehabilitation Medicine, Nutrition Services, Pharmacy Services and Other  Weekly team conferences will be held on Tuesdays to discuss your progress.  Your Inpatient Rehabilitation Care Coordinator will talk with you frequently to get your input and to update you on team discussions.  Team conferences with you and your family in attendance may also be held.  Expected length of stay: 10-12 days  Overall anticipated outcome: Supervision  Depending on your progress and recovery, your program may change. Your Inpatient Rehabilitation Care Coordinator will coordinate services and will keep you informed of any changes. Your Inpatient Rehabilitation Care Coordinator's name and contact numbers are listed  below.  The following services may also be recommended but are not provided by the [place] will be made to provide these services after discharge if needed.  Arrangements include referral to agencies that provide these services.  Your insurance has been verified to be:  Medicare A/B  Your primary doctor is:   [name]  Pertinent information will be shared with your doctor and your insurance company.  Inpatient Rehabilitation Care Coordinator:  Cathleen Corti [phone] or ([phone]  Information discussed with and copy given to patient by: Rana Snare, 05/11/2020, 12:38 PM

## 2020-05-11 NOTE — Progress Notes (Addendum)
Physical Therapy Session Note  Patient Details  Name: Danny Walker MRN: [id number] Date of Birth: [birth year]  Today's Date: 05/11/2020 PT Individual Time: 0900-1000 PT Individual Time Calculation (min): 60 min   Short Term Goals: Week 1:  PT Short Term Goal 1 (Week 1): = LTGs due to LOS  Skilled Therapeutic Interventions/Progress Updates:     Patient in recliner in the room with NT exiting upon PT arrival. Patient alert and agreeable to PT session. Patient denied pain during session, reported feeling swollen, but otherwise no complaints. Initiated session with [employer] communication system, then in-person interpreter arrived and remained throughout remainder of session.   Donned B TED hose and shoes with total A for lower extremity edema control at beginning of session. RN arrived to provide patient with morning medications. Discussed and confirmed patient's PLOF, reviewed home set-up, equipment (patient has RW, SPC), and plans to d/c home with intermittent assist. Reports 2 falls due to lower extremity weakness, states that both occurred outside the home, one on a step. Discussed MG diagnosis, patient with poor awareness of this diagnosis will have medical team provide education. Reports he was receiving HHPT PTA for lower extremity weakness.   Therapeutic Activity: Transfers: Patient performed sit to/from stand x8 with CGA progressing to supervision with use of B upper extremities. Provided verbal cues for forward weight shift and foot placement to shift COM over midline for improved balance with standing.  Gait Training:  Patient ambulated >100 feet x2 without an AD with CGA. Ambulated with decreased gait speed, decreased step length and height, increased B hip and knee flexion in stance, forward trunk lean, and downward head gaze.Marland Kitchen Provided verbal cues for erect posture, increased step height and length, and increased arm swing for balance without AD.  Neuromuscular Re-ed: Patient  performed the following standardized balance assessment: Patient demonstrates increased fall risk as noted by score of 20/56 on Berg Balance Scale.  (<36= high risk for falls, close to 100%; 37-45 significant >80%; 46-51 moderate >50%; 52-55 lower >25%)  Berg Balance Test Sit to Stand: Able to stand using hands after several tries Standing Unsupported: Able to stand 2 minutes with supervision (increased lower extremity shaking and sway at 1 min) Sitting with Back Unsupported but Feet Supported on Floor or Stool: Able to sit safely and securely 2 minutes Stand to Sit: Controls descent by using hands Transfers: Needs one person to assist Standing Unsupported with Eyes Closed: Able to stand 3 seconds Standing Ubsupported with Feet Together: Needs help to attain position but able to stand for 30 seconds with feet together From Standing, Reach Forward with Outstretched Arm: Reaches forward but needs supervision From Standing Position, Pick up Object from Floor: Unable to try/needs assist to keep balance From Standing Position, Turn to Look Behind Over each Shoulder: Needs supervision when turning Turn 360 Degrees: Needs close supervision or verbal cueing Standing Unsupported, Alternately Place Feet on Step/Stool: Needs assistance to keep from falling or unable to try Standing Unsupported, One Foot in Front: Needs help to step but can hold 15 seconds Standing on One Leg: Unable to try or needs assist to prevent fall Total Score: 20/56  Patient required frequent sitting rest breaks due to decreased activity tolerance. Notable lower extremity shaking and mild SOB noted with minimal activity. Provided education on present deficits, increased fall risk, interpretation of results on Berg Balance Test, and strategies for energy conservation during rest breaks.  Patient in recliner in the room at end of session with breaks  locked, chair alarm set, and all needs within reach.    Therapy  Documentation Precautions:  Precautions Precautions: Fall,Other (comment) Precaution Comments: colostomy Restrictions Weight Bearing Restrictions: No   Therapy/Group: Individual Therapy  [name] L Dayton Kenley PT, DPT  05/11/2020, 3:48 PM

## 2020-05-12 ENCOUNTER — Inpatient Hospital Stay (HOSPITAL_COMMUNITY): Payer: Medicare Other

## 2020-05-12 LAB — GLUCOSE, CAPILLARY
Glucose-Capillary: 101 mg/dL — ABNORMAL HIGH (ref 70–99)
Glucose-Capillary: 112 mg/dL — ABNORMAL HIGH (ref 70–99)
Glucose-Capillary: 114 mg/dL — ABNORMAL HIGH (ref 70–99)
Glucose-Capillary: 217 mg/dL — ABNORMAL HIGH (ref 70–99)
Glucose-Capillary: 89 mg/dL (ref 70–99)

## 2020-05-12 NOTE — Progress Notes (Signed)
Patient ID: Danny Walker, male   DOB: 1945/11/21, 74 y.o.   MRN: [id number]  SW spoke with pt step-daughter [name] to provide updates from team conference, and pt d/c date 12/28. Reports she is only able to provide a limited amount of assistance for pt and could not commit to any specific time. Pt will need to be at Mod I at time of discharge. States she is able to come in for family education on Thursday 10:30am-11:30am.   SW met with pt while in therapy to provide letter from attending for pt to provide to provider. SW also informed pt on above.   Loralee Pacas, MSW, [place] Office: [phone] Cell: [phone] Fax: [phone]

## 2020-05-12 NOTE — Progress Notes (Signed)
Occupational Therapy Session Note  Patient Details  Name: Danny Walker MRN: [id number] Date of Birth: 1945-10-10  Today's Date: 05/12/2020 OT Individual Time: 1106-1200 OT Individual Time Calculation (min): 54 min    Short Term Goals: Week 1:  OT Short Term Goal 1 (Week 1): Pt will complete LB bathing and dressing with use of AE and close supervision for two consecutive sessions. OT Short Term Goal 2 (Week 1): Pt will complete toilet transfers with close supervision to elevated toilet or 3:1. OT Short Term Goal 3 (Week 1): Pt will complete 3 grooming tasks in standing with supervision.  Skilled Therapeutic Interventions/Progress Updates:    Pt received sitting in recliner with no c/o pain. Interpretor present. Lengthy discussion re home tub measurements and use of AE. Pt at first stating he does not have a TTB but then after further discussion re stated he did. Pt completed sit > stand from recliner with supervision. He ambulated into the bathroom with close supervision. Toileting completed in standing with CGA and increased time required for void. Pt sat at the sink and required OT assistance to use electric razor (first time using one). Edu re safety. Pt then completed 150 ft of functional mobility from room to the therapy gym with CGA using the RW. Pt then took seated rest break. Pt completed 2 trials of standing level reciprocal stepping, activity graded with requiring more hip flexion to simulate tub transfer at home. Pt required CGA to min A overall. Pt returned to his room and was left sitting up in the recliner with all needs met, chair alarm set.   Therapy Documentation Precautions:  Precautions Precautions: Fall,Other (comment) Precaution Comments: colostomy Restrictions Weight Bearing Restrictions: No   Therapy/Group: Individual Therapy  Curtis Sites 05/12/2020, 7:31 AM

## 2020-05-12 NOTE — Patient Care Conference (Signed)
Inpatient RehabilitationTeam Conference and Plan of Care Update Date: 05/12/2020   Time: 10:27 AM    Patient Name: Danny Walker      Medical Record Number: [id number]  Date of Birth: 01/21/1946 Sex: Male         Room/Bed: 4W24C/4W24C-01 Payor Info: Payor: MEDICARE / Plan: MEDICARE PART A AND B / Product Type: *No Product type* /    Admit Date/Time:  05/08/2020  6:20 PM  Primary Diagnosis:  [facility] Problems: Principal Problem:   Debility    Expected Discharge Date: Expected Discharge Date: 05/19/20  Team Members Present: Physician leading conference: Dr. Alger Simons Care Coodinator Present: Loralee Pacas, LCSWA;Darothy Courtright Creig Hines, RN, BSN, [place] Nurse Present: Dwaine Gale, RN PT Present: Apolinar Junes, PT OT Present: Laverle Hobby, OT PPS Coordinator present : Gunnar Fusi, SLP     Current Status/Progress Goal Weekly Team Focus  Bowel/Bladder   pt continent of bladder and has an ostomy bag  Pt will remain continent of bladder and be able to take care of ostomy  bowel regimen, educate pt on care of ostomy   Swallow/Nutrition/ Hydration             ADL's   CGA ADL transfers, CGA UB ADLs, min A LB ADLs, decreased activity tolerance, min A ostomy care  mod I overall  ADL transfers, ADL retraining, ostomy care, activity tolerance   Mobility   Min-mod A bed mobility, supervision transfers, CGA gait, min A stairs, presents with LE shaking and decreased activity tolerance with all mobility  Mod I household mobility, supervision community mobility  Activity tolerance, LE strengthenig/NMR, balance, functional mobility, gait and stair training, patient/caregiver education   Communication             Safety/Cognition/ Behavioral Observations            Pain   pt does not complain of pain  Pt will remain free from pain  assess pain per shift give prn meds if needed   Skin   pt has incision site on abdomen, ostomy bag, and red area on bottom  Pt will  continue to wound healing and learning about care for ostomy.  monitor incision site for signs of infection, educate pt on ostomy bag and change as needed, educate pt on skin integrity (q2 self turns, barrier cream, foam pads)     Discharge Planning:  Pt lives alone. Pt will need to be an independent level of care as he states he will have intermittent support from his step daughter Vicente Males. SW to confirm with her.   Team Discussion: Continent Bladder, has ostomy, continue with teaching. Abdominal incision, no complaints of pain. OT reports patient is contact guard for tranfers, Upper body ADL's, min assist for lower body ADL's, Mod I goals. PT reports working on BLE strength, recommending RW. Patient has a ramped entrance. Patient on target to meet rehab goals: yes, patient is making good progress.  *See Care Plan and progress notes for long and short-term goals.   Revisions to Treatment Plan:  Ostomy teaching Abdominal incision dressing changes  Teaching Needs: Family education Ostomy training Abdominal wound care and dressing changes.  Current Barriers to Discharge: Decreased caregiver support, Home enviroment access/layout, Wound care, Behavior and Nutritional means  Possible Resolutions to Barriers: Continue current medications, education on ostomy care, education on wound care, offer nutritional supplementation, provide emotional support to patient and family.     Medical Summary Current Status: debility after ex-lap, sigmoid colectomy and colostomy.  has hx of MG. skin intact, ostomy sealed and functioning  Barriers to Discharge: Medical stability   Possible Resolutions to [employer] Focus: local skin care, ostomy mgt and education, regular review of patient data and VS, I's and O's   Continued Need for Acute Rehabilitation Level of Care: The patient requires daily medical management by a physician with specialized training in physical medicine and rehabilitation for the  following reasons: Direction of a multidisciplinary physical rehabilitation program to maximize functional independence : Yes Medical management of patient stability for increased activity during participation in an intensive rehabilitation regime.: Yes Analysis of laboratory values and/or radiology reports with any subsequent need for medication adjustment and/or medical intervention. : Yes   I attest that I was present, lead the team conference, and concur with the assessment and plan of the team.   Tennis Must 05/12/2020, 4:44 PM

## 2020-05-12 NOTE — Consult Note (Signed)
[place] Nurse ostomy follow up Patient receiving care in [facility] 4W24. Portable interpreter teleservice used, Operator Atzin [phone], throughout encounter Stoma type/location: LUQ colostomy Stomal assessment/size: moist, productive Peristomal assessment: deferred, pouch changed yesterday Treatment options for stomal/peristomal skin: barrier ring if needed Output: formed brown Ostomy pouching: 1pc.flat, Pattricia Boss. Education provided: Patient emptied existing pouch. He tells me he helps the nurse remove and place a new pouch when needed. Enrolled patient in [place] Discharge program: Yes, previously. Val Riles, RN, MSN, CWOCN, CNS-BC, pager [phone]

## 2020-05-12 NOTE — Progress Notes (Addendum)
Danny Walker PHYSICAL MEDICINE & REHABILITATION PROGRESS NOTE   Subjective/Complaints:  Made it through therapy fairly well yesterday. A little tired afterward. Slept well last night. Asked him if he head worked with nurse on ostomy and he said yest  ROS: Patient denies fever, rash, sore throat, blurred vision, nausea, vomiting, diarrhea, cough, shortness of breath or chest pain, joint or back pain, headache, or mood change.    Objective:   No results found. Recent Labs    05/11/20 0708  WBC 11.4*  HGB 9.9*  HCT 31.9*  PLT 330   Recent Labs    05/11/20 0708  NA 140  K 4.0  CL 107  CO2 23  GLUCOSE 100*  BUN 19  CREATININE 0.88  CALCIUM 9.0    Intake/Output Summary (Last 24 hours) at 05/12/2020 1225 Last data filed at 05/12/2020 0900 Gross per 24 hour  Intake 870 ml  Output 2525 ml  Net -1655 ml        Physical Exam: Vital Signs Blood pressure 124/68, pulse 64, temperature 98.4 F (36.9 C), temperature source Oral, resp. rate 18, height 5\' 7"  (1.702 m), weight 74.3 kg, SpO2 96 %.  Constitutional: No distress . Vital signs reviewed. HEENT: EOMI, oral membranes moist Neck: supple Cardiovascular: RRR without murmur. No JVD    Respiratory/Chest: CTA Bilaterally without wheezes or rales. Normal effort    GI/Abdomen: BS +, sl tender, abd incision CDI with staples, Ostomy intact Ext: no clubbing, cyanosis, or edema Psych: pleasant and cooperative Musculoskeletal:     normal cervical and lumbar rom.    Comments: No edema or tenderness in extremities  Skin:    General: Skin is warm and dry. Toe nails are long  Neurological:   Motor: 4+/5 UE and LE--> 3+/5 prox to 4/5 distally. Sensory exam intact. Alert and oriented x 3. Normal insight and awareness. Intact Memory. Normal language and speech. Cranial nerve exam unremarkable    Assessment/Plan: 1. Functional deficits which require 3+ hours per day of interdisciplinary therapy in a comprehensive inpatient rehab  setting.  Physiatrist is providing close team supervision and 24 hour management of active medical problems listed below.  Physiatrist and rehab team continue to assess barriers to discharge/monitor patient progress toward functional and medical goals  Care Tool:  Bathing    Body parts bathed by patient: Right arm,Left arm,Chest,Abdomen,Front perineal area,Buttocks,Right upper leg,Left upper leg,Face   Body parts bathed by helper: Left lower leg,Right lower leg     Bathing assist Assist Level: Minimal Assistance - Patient > 75%     Upper Body Dressing/Undressing Upper body dressing   What is the patient wearing?: Pull over shirt    Upper body assist Assist Level: Set up assist    Lower Body Dressing/Undressing Lower body dressing      What is the patient wearing?: Pants     Lower body assist Assist for lower body dressing: Minimal Assistance - Patient > 75%     Toileting Toileting    Toileting assist Assist for toileting: Independent Assistive Device Comment: urinal   Transfers Chair/bed transfer  Transfers assist     Chair/bed transfer assist level: Minimal Assistance - Patient > 75%     Locomotion Ambulation   Ambulation assist      Assist level: Moderate Assistance - Patient 50 - 74% Assistive device: No Device Max distance: 10'   Walk 10 feet activity   Assist     Assist level: Minimal Assistance - Patient > 75% Assistive device:  Hand held assist   Walk 50 feet activity   Assist    Assist level: Moderate Assistance - Patient - 50 - 74% Assistive device: Hand held assist    Walk 150 feet activity   Assist Walk 150 feet activity did not occur: Safety/medical concerns (RLE trembling/fatigued)         Walk 10 feet on uneven surface  activity   Assist Walk 10 feet on uneven surfaces activity did not occur: Safety/medical concerns (RLE trembling/fatigue)         Wheelchair     Assist Will patient use wheelchair at  discharge?: No      Wheelchair assist level: Supervision/Verbal cueing Max wheelchair distance: 50    Wheelchair 50 feet with 2 turns activity    Assist    Wheelchair 50 feet with 2 turns activity did not occur:  (fatigue)       Wheelchair 150 feet activity     Assist  Wheelchair 150 feet activity did not occur:  (fatigue)       Blood pressure 124/68, pulse 64, temperature 98.4 F (36.9 C), temperature source Oral, resp. rate 18, height 5\' 7"  (1.702 m), weight 74.3 kg, SpO2 96 %.  Medical Problem List and Plan: 1.  Debility secondary to perforated diverticulitis complicated by septic shock.  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  Wound care nurse follow-up for colostomy care and education             -patient may not shower             -ELOS/Goals: 05/19/20  mod I  -continue therapies.   2.  Antithrombotics: -DVT/anticoagulation: Lovenox             -antiplatelet therapy: N/A 3. Pain Management: Oxycodone as needed  12/21- pain controlled- con't regimen 4. Mood: Provide emotional support             -antipsychotic agents: N/A 5. Neuropsych: This patient is capable of making decisions on his own behalf. 6. Skin/Wound Care: Routine skin checks  12/21 incision looks great---staples out soon (12/10)  -no podiatry here at hospital 7. Fluids/Electrolytes/Nutrition: encourage PO             CMP reviewed 12/20 and other than albumin/protein looks ok  -protein supps 8. Acute blood anemia             Hemoglobin 9.4 on 12/16---9.9 12/20 9.  Hypertension.  Lisinopril 40 mg daily, Norvasc 5 mg daily.    12/21- BP controlled- con't regimen             Monitor increase mobility 10.  Myasthenia gravis.  Prednisone 20 mg twice daily and Mestinon 30 mg every 8 hours.  Follow neurology services for management  -wbc's up d/t steroids 11.  BPH.  Ditropan 5 mg daily, Proscar 5 mg daily             Monitor for retention 12.   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  SSI             Monitor with increased mobility  13. 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 Appreciate her help   LOS: 4 days A FACE TO FACE EVALUATION WAS PERFORMED  Meredith Staggers 05/12/2020, 12:25 PM

## 2020-05-12 NOTE — Progress Notes (Signed)
Physical Therapy Session Note  Patient Details  Name: Danny Walker MRN: [id number] Date of Birth: Jun 09, 1945  Today's Date: 05/12/2020 PT Individual Time: 0905-1000 and 1317-1415 PT Individual Time Calculation (min): 55 min and 58 min   Short Term Goals: Week 1:  PT Short Term Goal 1 (Week 1): = LTGs due to LOS  Skilled Therapeutic Interventions/Progress Updates:     Session 1: Patient in recliner in the room with RN providing medications upon PT arrival. In-person interpreter present throughout session. Patient alert and agreeable to PT session. Patient denied pain during session. PT donned B TEDs and shoes with total A as patient took his medications. Removed gel inserts due to increased edema in B feet and ankles. RN emptied colostomy bag. PT provided intermittent education on colostomy care throughout.   Therapeutic Activity: Transfers: Patient performed sit to/from stand x5 with min A x1 due to increased lower extremity stiffness from sitting for a prolonged period. All other transfers were CGA-close supervision using RW. Provided verbal cues for hand placement on RW and increased forward weight shift.  Gait Training:  Patient ambulated >100 feet x2 using RW with CGA-close supervision. Ambulated with decreased gait speed, decreased step length and height, increased B hip and knee flexion in stance, forward trunk lean, and downward head gaze. Provided verbal cues for increased step height for safety, erect posture, and looking ahead. 6 Min Walk Test:  Instructed patient to ambulate as quickling and as safely as possible for 6 minutes using LRAD. Patient was allowed to take standing rest breaks without stopping the test, but if he required a sitting rest break the clock would be stopped and the test would be over.  Results: 720 feet using a RW, RPE 5/10 after, HR 116 bpm, recovered to 94 bpm in <1 min  Patient in recliner in the room at end of session with breaks locked, chair alarm  set, and all needs within reach.   Session 2: Patient in recliner upon PT arrival. In-person interpreter present throughout session. Patient alert and agreeable to PT session. Patient denied pain during session.  Patient requested to empty his colostomy bag at beginning of session. Patient emptied his colostomy bag with set-up assist and min A to hold container. Provided cues for efficient technique. Patient ambulated to the sink with CGA-close supervision to perform hand hygiene after.   Therapeutic Activity: Transfers: Patient performed sit to/from stand x10 with CGA progressing to supervision. Provided verbal cues for scooting forward and forward weight shift to stand and controlled descent for safety.  Gait Training:  Patient ambulated >150 feet x2 using RW with close supervision on first trial and without AD with CGA for safety/balance on second trial. Ambulated with decreased gait speed, decreased step length and height, increased B hip and knee flexion in stance, forward trunk lean, and downward head gaze. Noted improved posture and natural arm swing without AD. Provided verbal cues for erect posture, looking ahead, increased step length, and increased arm swing to improve balance and gait speed without AD. Performed backwards walking and side stepping in // bars progressing from B UE support to no UE support 8 ft x4 for each activity. Focused on increased gluteal and hamstring activation with backwards walking with cues for increased step length and height, and focused on increased lateral hip activation during side-stepping with cues for reduced toe out with leading limb and erect posture for activation in stance limb  Therapeutic Exercise: Patient performed the following exercises with verbal and  tactile cues for proper technique. -mini squats without UE support x10 -B 4 way hip with B UE support x10 -heel/toe raises with B UE support x10 -side stepping with orange band around thighs  without UE support R/L 2x 8 ft  Patient required frequent sitting rest breaks throughout session. Able to recover with reduced time this session 30-90 sec. Discussed HEP with HHPT PTA and modifications to increased challenge to exercises for lower extremity strengthening during rest breaks.  Patient in recliner in the room at end of session with breaks locked, chair alarm set, and all needs within reach.   Therapy Documentation Precautions:  Precautions Precautions: Fall,Other (comment) Precaution Comments: colostomy Restrictions Weight Bearing Restrictions: No   Therapy/Group: Individual Therapy  Starsha Morning L Briza Bark PT, DPT  05/12/2020, 1:13 PM

## 2020-05-13 ENCOUNTER — Inpatient Hospital Stay (HOSPITAL_COMMUNITY): Payer: Medicare Other

## 2020-05-13 LAB — GLUCOSE, CAPILLARY
Glucose-Capillary: 100 mg/dL — ABNORMAL HIGH (ref 70–99)
Glucose-Capillary: 118 mg/dL — ABNORMAL HIGH (ref 70–99)
Glucose-Capillary: 147 mg/dL — ABNORMAL HIGH (ref 70–99)
Glucose-Capillary: 148 mg/dL — ABNORMAL HIGH (ref 70–99)
Glucose-Capillary: 182 mg/dL — ABNORMAL HIGH (ref 70–99)
Glucose-Capillary: 79 mg/dL (ref 70–99)

## 2020-05-13 NOTE — Progress Notes (Signed)
Occupational Therapy Session Note  Patient Details  Name: Wataru Mccowen MRN: [id number] Date of Birth: 09/18/1945  Today's Date: 05/13/2020 OT Individual Time: 1103-1200 OT Individual Time Calculation (min): 57 min    Short Term Goals: Week 1:  OT Short Term Goal 1 (Week 1): Pt will complete LB bathing and dressing with use of AE and close supervision for two consecutive sessions. OT Short Term Goal 2 (Week 1): Pt will complete toilet transfers with close supervision to elevated toilet or 3:1. OT Short Term Goal 3 (Week 1): Pt will complete 3 grooming tasks in standing with supervision.  Skilled Therapeutic Interventions/Progress Updates:    1;1. Pt received in bed agreeable to OT. Pt completes toileting with S overall and VC for managing/emptying ostomy. Pt completes bathing and dressing at sit to stand at sink with VC for limiting forward flexion d/t abdominal incision with LB dressing. Edu re LHSS, reacher, sock and installed elastic laces. Pt able to dress with supervision using AE and increased time. Pt completes mobility with RW around unit for general conditioning with VC for standing rest breaks as needed. Exited session with pt seated in recliner, exit alarm on and call light in reach   Therapy Documentation Precautions:  Precautions Precautions: Fall,Other (comment) Precaution Comments: colostomy Restrictions Weight Bearing Restrictions: No General:   Vital Signs: Therapy Vitals Temp: 98.4 F (36.9 C) Pulse Rate: 66 Resp: 18 BP: 126/71 Patient Position (if appropriate): Lying Oxygen Therapy SpO2: 96 % O2 Device: Room Air Pain:   ADL: ADL Eating: Independent Where Assessed-Eating: Bed level Grooming: Setup Where Assessed-Grooming: Edge of bed Upper Body Bathing: Supervision/safety Where Assessed-Upper Body Bathing: Edge of bed Lower Body Bathing: Minimal assistance Where Assessed-Lower Body Bathing: Edge of bed Upper Body Dressing:  Supervision/safety Where Assessed-Upper Body Dressing: Edge of bed Lower Body Dressing: Moderate assistance Where Assessed-Lower Body Dressing: Edge of bed Toileting: Minimal assistance Where Assessed-Toileting: Glass blower/designer: Psychiatric nurse Method: Arts development officer: Raised toilet seat Tub/Shower Transfer: Not assessed Social research officer, government: Not assessed Vision   Perception    Praxis   Exercises:   Other Treatments:     Therapy/Group: Individual Therapy  Tonny Branch 05/13/2020, 6:57 AM

## 2020-05-13 NOTE — Progress Notes (Signed)
Danny Walker PHYSICAL MEDICINE & REHABILITATION PROGRESS NOTE   Subjective/Complaints: Complains of Right lower quadrant abdominal pain that starts after eating and lasts for about 1 hour. Does not have pain currently. Advised eating smaller meals and ordered KUB. He is also concerned about being told he can't drive for several months.   ROS: Patient denies fever, rash, sore throat, blurred vision, nausea, vomiting, diarrhea, cough, shortness of breath or chest pain, joint or back pain, headache, or mood change. +right sided abdominal pain   Objective:   No results found. Recent Labs    05/11/20 0708  WBC 11.4*  HGB 9.9*  HCT 31.9*  PLT 330   Recent Labs    05/11/20 0708  NA 140  K 4.0  CL 107  CO2 23  GLUCOSE 100*  BUN 19  CREATININE 0.88  CALCIUM 9.0    Intake/Output Summary (Last 24 hours) at 05/13/2020 1453 Last data filed at 05/13/2020 1259 Gross per 24 hour  Intake 780 ml  Output 1726 ml  Net -946 ml        Physical Exam: Vital Signs Blood pressure 115/64, pulse 96, temperature 98.2 F (36.8 C), resp. rate 20, height 5\' 7"  (1.702 m), weight 74.3 kg, SpO2 97 %.  Gen: no distress, normal appearing HEENT: oral mucosa pink and moist, NCAT Cardio: Reg rate Chest: normal effort, normal rate of breathing GI/Abdomen: BS +, sl tender, abd incision CDI with staples, Ostomy intact Ext: no clubbing, cyanosis, or edema Psych: pleasant and cooperative Musculoskeletal:     normal cervical and lumbar rom.    Comments: No edema or tenderness in extremities  Skin:    General: Skin is warm and dry. Toe nails are long  Neurological:   Motor: 4+/5 UE and LE--> 3+/5 prox to 4/5 distally. Sensory exam intact. Alert and oriented x 3. Normal insight and awareness. Intact Memory. Normal language and speech. Cranial nerve exam unremarkable    Assessment/Plan: 1. Functional deficits which require 3+ hours per day of interdisciplinary therapy in a comprehensive inpatient  rehab setting.  Physiatrist is providing close team supervision and 24 hour management of active medical problems listed below.  Physiatrist and rehab team continue to assess barriers to discharge/monitor patient progress toward functional and medical goals  Care Tool:  Bathing    Body parts bathed by patient: Right arm,Left arm,Chest,Abdomen,Front perineal area,Buttocks,Right upper leg,Left upper leg,Face   Body parts bathed by helper: Left lower leg,Right lower leg     Bathing assist Assist Level: Minimal Assistance - Patient > 75%     Upper Body Dressing/Undressing Upper body dressing   What is the patient wearing?: Pull over shirt    Upper body assist Assist Level: Set up assist    Lower Body Dressing/Undressing Lower body dressing      What is the patient wearing?: Pants     Lower body assist Assist for lower body dressing: Minimal Assistance - Patient > 75%     Toileting Toileting    Toileting assist Assist for toileting: Independent Assistive Device Comment: urinal   Transfers Chair/bed transfer  Transfers assist     Chair/bed transfer assist level: Minimal Assistance - Patient > 75%     Locomotion Ambulation   Ambulation assist      Assist level: Moderate Assistance - Patient 50 - 74% Assistive device: No Device Max distance: 10'   Walk 10 feet activity   Assist     Assist level: Minimal Assistance - Patient > 75% Assistive device:  Hand held assist   Walk 50 feet activity   Assist    Assist level: Moderate Assistance - Patient - 50 - 74% Assistive device: Hand held assist    Walk 150 feet activity   Assist Walk 150 feet activity did not occur: Safety/medical concerns (RLE trembling/fatigued)         Walk 10 feet on uneven surface  activity   Assist Walk 10 feet on uneven surfaces activity did not occur: Safety/medical concerns (RLE trembling/fatigue)         Wheelchair     Assist Will patient use wheelchair  at discharge?: No      Wheelchair assist level: Supervision/Verbal cueing Max wheelchair distance: 50    Wheelchair 50 feet with 2 turns activity    Assist    Wheelchair 50 feet with 2 turns activity did not occur:  (fatigue)       Wheelchair 150 feet activity     Assist  Wheelchair 150 feet activity did not occur:  (fatigue)       Blood pressure 115/64, pulse 96, temperature 98.2 F (36.8 C), resp. rate 20, height 5\' 7"  (1.702 m), weight 74.3 kg, SpO2 97 %.  Medical Problem List and Plan: 1.  Debility secondary to perforated diverticulitis complicated by septic shock.  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  Wound care nurse follow-up for colostomy care and education             -patient may not shower             -ELOS/Goals: 05/19/20  mod I  -Continue therapies.   2.  Antithrombotics: -DVT/anticoagulation: Lovenox             -antiplatelet therapy: N/A 3. Pain Management:   12/22- pain controlled- d/c oxycodone 4. Mood: Provide emotional support             -antipsychotic agents: N/A 5. Neuropsych: This patient is capable of making decisions on his own behalf. 6. Skin/Wound Care: Routine skin checks  12/22 incision looks great---staples out soon (12/10), wound care consulted regarding slight opening at the base- appreciate their recs.   -no podiatry here at hospital 7. Fluids/Electrolytes/Nutrition: encourage PO             CMP reviewed 12/20 and other than albumin/protein looks ok  -protein supps 8. Acute blood anemia             Hemoglobin 9.4 on 12/16---9.9 12/20 9.  Hypertension.  Lisinopril 40 mg daily, Norvasc 5 mg daily.    12/21- BP controlled- con't regimen             Monitor increase mobility 10.  Myasthenia gravis.  Prednisone 20 mg twice daily and Mestinon 30 mg every 8 hours.  Follow neurology services for management  -wbc's up d/t steroids 11.  BPH.  Ditropan 5 mg daily, Proscar 5 mg daily              Monitor for retention 12.  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  SSI            CBGs ranging from 79-147: continue to monitor  13. 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 Appreciate her help 14. Abdominal pain: advised smaller meals, ordered KUB   LOS: 5 days A FACE TO FACE EVALUATION WAS PERFORMED  Clide Deutscher Danny Walker 05/13/2020, 2:53 PM

## 2020-05-13 NOTE — Progress Notes (Signed)
Physical Therapy Session Note  Patient Details  Name: Danny Walker MRN: [id number] Date of Birth: 02-Jul-1945  Today's Date: 05/13/2020 PT Individual Time:0900-1000 and 4270-6237 PT Individual Time Calculation (min): 60 min and 72 min   Short Term Goals: Week 1:  PT Short Term Goal 1 (Week 1): = LTGs due to LOS  Skilled Therapeutic Interventions/Progress Updates:     Session 1: Patient in recliner in the room upon PT arrival. Patient alert and agreeable to PT session. Patient denied pain during session, however, reported intermittent tingling in his R lower extremity from below his knee to his ankle.   In-person interpreter present throughout session.   Therapeutic Activity: Transfers: Patient performed sit to/from stand x4 with CGA-close supervision without AD. Provided verbal cues for scooting forward and forward weight shift. Patient with increased use of upper extremities to complete transfers.  Gait Training:  Patient ambulated >250 feet x2 without an AD with CGA-close supervision. Ambulated with decreased gait speed, decreased step length and height, increased B hip and mild knee flexion in stance, forward trunk lean, and improved visual scanning. Provided verbal cues for increased arm swing for improved balance and gait speed, erect posture, visually scanning environment, and increased step height, especially with fatigue.  Neuromuscular Re-ed: Patient performed the following balance and lower extremity motor control activities: -sit to/from stand from slightly elevated mat table 1x3 and 1x4 with hands on thighs, focused on pre-stand positioning and forward weight shift with increased use of lower extremities to push up to stand; progressed from min A to CGA -sit to/from stand from mat table on lowest setting 2x4 with hands on thighs, focused on pre-stand positioning and forward weight shift with increased use of lower extremities to push up to stand; progressed from min A to  CGA then unable to fully stand on last trial due to lower extremity fatigue -standing feet together without upper extremity support 1x30 sec and 1x90 sec, noted increased use of ankle strategies and lower extremity fatigue with activity, improved on second trial, provided cues for activation of gluteal muscle to improve stability and erect posture with fixed gaze to improve balance  Patient in recliner in the room at end of session with breaks locked, chair alarm set, and all needs within reach.   Session 2: Patient in recliner in the room upon PT arrival. Patient alert and agreeable to PT session. Patient denied pain during session, however reported lower R quadrant abdominal pain after eating lunch that resolved <1 hour. MD made aware.  In-person interpreter present throughout session.   Patient asking to empty colostomy bag at beginning of session. Patient performed task with set-up assist and mod cues for effective technique to empty the bag to completion.   Focused remainder of session on simulating household mobility and education on home modifications and energy conservation techniques.   Therapeutic Activity: Bed Mobility: Patient performed supine to/from sit with supervision on ADL bed. Provided verbal cues for reduced reaching for head board and hand placement to push up to sitting. Transfers: Patient performed ambulatory transfer to the bathroom using RW, stood to void, and stood to wash his hands with supervision for safety. Patient performed sit to/from stand from standard height surfaces (recliner in the room, bed, standard chair) with supervision using RW and from low surfaces (ADL couch x2) with min-mod A. Patient reports that his couch is lower than the ADL couch, educated on sitting on different surfaces or elevating the seat height with a cushion or pillows,  patient in agreement. Provided verbal cues for scooting forward, forward weight shift, and activation on hip extensors to  boost up.  Patient ambulated to the kitchen using RW and demonstrated 3 exercises from HEP provided by HHPT to demonstrate daily routine. Performed 4 way hip, mini squats, and heel/toe raises with min cues for technique.   Discussed home set up, patient reports his home is small with plenty of places to rest. He stated that he spends a lot of time cooking in the kitchen. Educated on energy conservation strategies when cooking.   Patient with questions regarding family education with his step-daughter tomorrow. Reports she does not like hospitals and will not want to stay for a long time. Discussed with CSW, patient's step-daughter will be present tomorrow from 10:30-11:30 am, patient made aware.   Gait Training:  Patient ambulated >200 feet x2 using RW with supervision. Ambulated with decreased gait speed, decreased step length and height, increased B hip and knee flexion in stance, forward trunk lean, and downward head gaze. Provided verbal cues for erect posture and increased step height, especially with fatigue.  Patient in recliner in the room at end of session with breaks locked, chair alarm set, and all needs within reach.    Therapy Documentation Precautions:  Precautions Precautions: Fall,Other (comment) Precaution Comments: colostomy Restrictions Weight Bearing Restrictions: No   Therapy/Group: Individual Therapy  Danny Walker L Jakwan Sally PT, DPT  05/13/2020, 5:39 PM

## 2020-05-14 ENCOUNTER — Inpatient Hospital Stay (HOSPITAL_COMMUNITY): Payer: Medicare Other

## 2020-05-14 LAB — GLUCOSE, CAPILLARY
Glucose-Capillary: 106 mg/dL — ABNORMAL HIGH (ref 70–99)
Glucose-Capillary: 128 mg/dL — ABNORMAL HIGH (ref 70–99)
Glucose-Capillary: 132 mg/dL — ABNORMAL HIGH (ref 70–99)
Glucose-Capillary: 152 mg/dL — ABNORMAL HIGH (ref 70–99)
Glucose-Capillary: 177 mg/dL — ABNORMAL HIGH (ref 70–99)
Glucose-Capillary: 78 mg/dL (ref 70–99)

## 2020-05-14 NOTE — Progress Notes (Signed)
Physical Therapy Session Note  Patient Details  Name: Danny Walker MRN: [id number] Date of Birth: 25-Feb-1946  Today's Date: 05/14/2020 PT Individual Time: 1115-1200 and 1305-1400 PT Individual Time Calculation (min): 45 min and 55 min   Short Term Goals: Week 1:  PT Short Term Goal 1 (Week 1): = LTGs due to LOS  Skilled Therapeutic Interventions/Progress Updates:     Session 1: Patient in recliner in the room with his step-daughter present for family education upon PT arrival. PT late to session due to patient care needs during previous session, patient's step daughter accepted therapist's apologies and stated understanding. Patient alert and agreeable to PT session. Patient denied pain during session.  In-person interpreter present throughout session.   Educated patient's daughter on patient's CLOF, progress with therapy, therapy goals set for mod I household and supervision community mobility. Patient's daughter agreed that she could assist patient with community mobility and transportation, as well as grocery shopping at d/c. She also agreed to check on patient daily in person or by phone. As patient will be supervision and patient's daughter familiar with assisting at this level for her mother. PT and his step-daughter agreed that she did not need to remain longer than the scheduled time to perform mobility with patient. Educated her and the patient on use of RW with all mobility at d/c initially to patient's balance deficits indicative of increased fall risk, both stated understanding. Patient and his daughter inquiring about driving at d/c, will discuss with MD, and asking to speak with a doctor about return to work letter provided to patient earlier in the week. His step-daughter did not indicate what question she has about this, MD informed via secure chat.   Therapeutic Exercise: Patient performed the following exercises with verbal and tactile cues for proper technique. Patient  ambulated >500 feet using RW with close supervision for safety. Ambulated with decreased gait speed, decreased step length and height, increased B hip and knee flexion in stance, mild forward trunk lean, and improved visual scanning. Provided verbal cues for paced breathing and increased gait speed as tolerated.  Patient in recliner at end of session with breaks locked, chair alarm set, and all needs within reach. Reviewed all education and discussion with patient's step daughter with the patient following her departure to ensure patient's understanding. Patient stated understanding and in agreement.   Session 2: Patient in recliner in the room upon PT arrival. Patient alert and agreeable to PT session. Patient denied pain during session.  In-person interpreter present throughout session.   Therapeutic Activity: Transfers: Patient performed sit to/from stand with supervision with and without RW throughout session.   Gait Training:  Patient ambulated >250 feet x2 using RW with supervision. Ambulated as above. Provided verbal cues for increased step height, erect posture, and educated on improved safety with RW for energy conservation and balance to improve patient buy-in to using RW at home. Performed weaving between 6 cones 1.5 ft apart x4 with close supervision without and AD with slow gait speed to practice turning technique and improve dynamic balance with gait.  Neuromuscular Re-ed: Patient performed the following activities in standing with supervision for dynamic standing balance: -BITS Driving Assessment level 1 (visual scanning and reaction time) x7, patient motivated to pass the level, passing score 50 in 60 sec, patient scored 28 in 60 sec with 2.12 sec reaction time on first trial and progressed to 44 in 60 sec with 1.35 reaction time; trials in between demonstrated gradual progression in  performance; educated patient on purpose of test and relation of test to driving reaction time and  safety, patient stated understanding and determined to perform better  Patient required sitting rest breaks between 1-2 trials due to decreased activity tolerance and demonstrated increased lower extremity fatigue after completing the last trial. Discussed d/c planning and home safety during rest breaks. Patient reports that his home is set up with furniture nearby for [place] assist and no throw rugs or obstacles in the floor. He stated that there is enough room to use a RW throughout the house.   Patient doffed his shoes in sitting at end of session with min cues and required total A for doffing TED hose due to lower extremity edema. Assisted patient with writing down meal order in [place] to provide to nutritional services later today.  Patient in recliner in the room at end of session with breaks locked, chair alarm set, and all needs within reach.    Therapy Documentation Precautions:  Precautions Precautions: Fall,Other (comment) Precaution Comments: colostomy Restrictions Weight Bearing Restrictions: No General: PT Amount of Missed Time (min): 15 Minutes PT Missed Treatment Reason: Other (Comment) (PT held over with another patient)   Therapy/Group: Individual Therapy  Yuval Nolet L Jaidan Stachnik PT, DPT  05/14/2020, 3:59 PM

## 2020-05-14 NOTE — Progress Notes (Signed)
Occupational Therapy Session Note  Patient Details  Name: Danny Walker MRN: [id number] Date of Birth: January 24, 1946  Today's Date: 05/14/2020 OT Individual Time: 0900-1013 OT Individual Time Calculation (min): 73 min    Short Term Goals: Week 1:  OT Short Term Goal 1 (Week 1): Pt will complete LB bathing and dressing with use of AE and close supervision for two consecutive sessions. OT Short Term Goal 2 (Week 1): Pt will complete toilet transfers with close supervision to elevated toilet or 3:1. OT Short Term Goal 3 (Week 1): Pt will complete 3 grooming tasks in standing with supervision.  Skilled Therapeutic Interventions/Progress Updates:    1:1. Pt received in recliner agreeable to OT. Pt completes toileting with RW standing with set up for mobility and no cuing needed to put RW over toilet. Pt sits on BSC and empties colostomy into vessel for measuring (200 output). Pt requires inreased time for emptying seated with setup of gloves and towels. Pt completes washing face and applying shaving cream seated and standing position with S while OT shaves face for safety. Edu re use of electric razor for closer shave than trimmer. Pt completes ambulation with S overall to dayroom and plays Wii bowling in standing with no rest breaks for all 10 frames for functional endurance with S for unilateral increasing to NO support on RW. Increased ant/post sway in stance with no support. No LOB. Returned to room after seated rest break. Exited session with pt seated in recliner, exit alarm on and call light in reach   Therapy Documentation Precautions:  Precautions Precautions: Fall,Other (comment) Precaution Comments: colostomy Restrictions Weight Bearing Restrictions: No General:   Vital Signs: Therapy Vitals Temp: 98.2 F (36.8 C) Temp Source: Oral Pulse Rate: 62 Resp: 18 BP: 113/69 Patient Position (if appropriate): Lying Oxygen Therapy SpO2: 98 % O2 Device: Room Air Pain:    ADL: ADL Eating: Independent Where Assessed-Eating: Bed level Grooming: Setup Where Assessed-Grooming: Edge of bed Upper Body Bathing: Supervision/safety Where Assessed-Upper Body Bathing: Edge of bed Lower Body Bathing: Minimal assistance Where Assessed-Lower Body Bathing: Edge of bed Upper Body Dressing: Supervision/safety Where Assessed-Upper Body Dressing: Edge of bed Lower Body Dressing: Moderate assistance Where Assessed-Lower Body Dressing: Edge of bed Toileting: Minimal assistance Where Assessed-Toileting: Glass blower/designer: Psychiatric nurse Method: Arts development officer: Raised toilet seat Tub/Shower Transfer: Not assessed Social research officer, government: Not assessed Vision   Perception    Praxis   Exercises:   Other Treatments:     Therapy/Group: Individual Therapy  Tonny Branch 05/14/2020, 6:47 AM

## 2020-05-14 NOTE — Progress Notes (Signed)
Inpatient Rehabilitation Care Coordinator Assessment and Plan Patient Details  Name: Danny Walker MRN: [id number] Date of Birth: April 29, 1946  Today's Date: 05/14/2020  Hospital Problems: Principal Problem:   Debility  Past Medical History:  Past Medical History:  Diagnosis Date  . BPH (benign prostatic hyperplasia)   . DDD (degenerative disc disease), lumbosacral   . Fatty liver   . Foley catheter in place   . History of gallstones   . History of prostatitis   . Hypertension   . IDA (iron deficiency anemia)   . Liver mass 09/17/2001   4.7cm , noted on [place] abd  . Low back pain   . Myasthenia gravis ([place])   . Nocturia   . 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 Spondylosis Lumbar  . Umbilical hernia   . Urinary retention 02/08/2018  . Weakness of both legs 02/2020   Past Surgical History:  Past Surgical History:  Procedure Laterality Date  . COLON RESECTION SIGMOID N/A 05/01/2020   Procedure: COLON RESECTION SIGMOID;  Surgeon: Donnie Mesa, MD;  Location: [place];  Service: General;  Laterality: N/A;  . COLOSTOMY N/A 05/01/2020   Procedure: COLOSTOMY;  Surgeon: Donnie Mesa, MD;  Location: [place];  Service: General;  Laterality: N/A;  . ERCP W/ SPHICTEROTOMY  09/17/2001  . INSERTION OF MESH N/A 02/05/2016   Procedure: INSERTION OF MESH, UMBILICAL HERNIA;  Surgeon: Armandina Gemma, MD;  Location: [facility];  Service: General;  Laterality: N/A;  . LAPAROSCOPIC CHOLECYSTECTOMY  09/22/2001  . LAPAROTOMY N/A 05/01/2020   Procedure: EXPLORATORY LAPAROTOMY;  Surgeon: Donnie Mesa, MD;  Location: [facility];  Service: General;  Laterality: N/A;  . TRANSURETHRAL RESECTION OF PROSTATE N/A 03/05/2018   Procedure: TRANSURETHRAL RESECTION OF THE PROSTATE (TURP);  Surgeon: Cleon Gustin, MD;  Location: [facility];  Service: Urology;  Laterality: N/A;  . UMBILICAL HERNIA REPAIR N/A 02/05/2016   Procedure: UMBILICAL HERNIA  REPAIR WITH MESH PATCH;  Surgeon: Armandina Gemma, MD;  Location: [place];  Service: General;  Laterality: N/A;   Social History:  reports that he has never smoked. He has never used smokeless tobacco. He reports that he does not drink alcohol and does not use drugs.  Family / Support Systems Marital Status: Widow/Widower How Long?: 4 years Patient Roles: Parent Spouse/Significant Other: Widowed Children: step daughter [name] Other Supports: None reported Anticipated Caregiver: N/A Ability/Limitations of Caregiver: Pt step dtr [name] can only provide intermittent support, and not willing to commit to a specific time. 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 Legal History/Current Legal Issues: Denies Guardian/Conservator: N/A   Abuse/Neglect Abuse/Neglect Assessment Can Be Completed: Yes Physical Abuse: Denies Verbal Abuse: Denies Sexual Abuse: Denies Exploitation of patient/patient's resources: Denies Self-Neglect: Denies  Emotional Status Pt's affect, behavior and adjustment status: Pt in good spirits at time of visit. Recent Psychosocial Issues: Denies Psychiatric History: Denies Substance Abuse History: Denies  Patient / Family Perceptions, Expectations & Goals Pt/Family understanding of illness & functional limitations: Pt has general understanding of care needs Premorbid pt/family roles/activities: Independent Anticipated changes in roles/activities/participation: Assistance with IADLs Pt/family expectations/goals: Pt goal is  to get legs stronger  [employer]: None Premorbid  Home Care/DME Agencies: None Transportation available at discharge: TBD Resource referrals recommended: Neuropsychology  Discharge Planning Living Arrangements: Alone Support Systems: Other relatives Type of Residence: Private residence Insurance Resources: [place] [employer] (specify county) Museum/gallery curator Resources: [place] Referred: No Living Expenses: Own Money Management: Patient Does the patient have any problems obtaining your medications?: No Care Coordinator Barriers to Discharge: Decreased caregiver support,Lack of/limited family support Care Coordinator Anticipated Follow Up Needs: HH/OP  Clinical Impression SW met with pt in room to complete assessment. Spanish interpreter Davy Pique present. SW introduced self, explained role, and discussed d/c process. Pt is not a English as a second language teacher. No HCPOA. DME: RW and canes. Pt states his PCP  Is Joella Prince. In system she is the consulting neurologist.   2:23pm- SW left message for pt step-daughter [name] to introduce self, explain role, and discuss discharge process. SW waiting on follow-up.   Mckinzey Entwistle A Teyanna Thielman 05/14/2020, 3:52 PM

## 2020-05-14 NOTE — Progress Notes (Signed)
[place] PHYSICAL MEDICINE & REHABILITATION PROGRESS NOTE   Subjective/Complaints: No abd pain, no nausea or vomiting No problems during therpay yesterday   ROS: Neg CP, SOB, no limb pain Objective:   DG Abd 1 View  Result Date: 05/13/2020 CLINICAL DATA:  Abdominal pain EXAM: ABDOMEN - 1 VIEW COMPARISON:  May 01, 2020 FINDINGS: There is moderate stool in the colon. There is no bowel dilatation or air-fluid level to suggest bowel obstruction. A small focus of extraluminal air is seen in the right upper quadrant. There are skin staples overlying the lower abdomen and pelvis. There are surgical clips in the right upper quadrant. There is degenerative change in the lumbar spine with scoliosis. IMPRESSION: Small amount of apparent extraluminal air in the right upper quadrant which may represent residual postoperative air. This finding warrants close clinical and potential imaging surveillance. It may be prudent to obtain upright or left lateral decubitus abdominal image in particular to further evaluate given this finding. No findings indicative of bowel obstruction. Postoperative changes are noted. These results will be called to the ordering clinician or representative by the Radiologist Assistant, and communication documented in the PACS or [employer]. Electronically Signed   By: Lowella Grip III M.D.   On: 05/13/2020 17:01   No results for input(s): WBC, HGB, HCT, PLT in the last 72 hours. No results for input(s): NA, K, CL, CO2, GLUCOSE, BUN, CREATININE, CALCIUM in the last 72 hours.  Intake/Output Summary (Last 24 hours) at 05/14/2020 0842 Last data filed at 05/14/2020 0715 Gross per 24 hour  Intake 776 ml  Output 1376 ml  Net -600 ml        Physical Exam: Vital Signs Blood pressure 113/69, pulse 62, temperature 98.2 F (36.8 C), temperature source Oral, resp. rate 18, height 5\' 7"  (1.702 m), weight 74.3 kg, SpO2 98 %.   General: No acute distress Mood and  affect are appropriate Heart: Regular rate and rhythm no rubs murmurs or extra sounds Lungs: Clear to auscultation, breathing unlabored, no rales or wheezes Abdomen: Positive bowel sounds, soft nontender to palpation, nondistended Extremities: No clubbing, cyanosis, or edema Skin: Staples intact, colostomy bag and appliance are intact, full of loose lt brown stool No tenderness or erythema around appliance    Skin:    General: Skin is warm and dry. Toe nails are long  Neurological:   Motor: 4+/5 UE and LE--> 3+/5 prox to 4/5 distally. Sensory exam intact. Alert and oriented x 3. Normal insight and awareness. Intact Memory. Normal language and speech. Cranial nerve exam unremarkable    Assessment/Plan: 1. Functional deficits which require 3+ hours per day of interdisciplinary therapy in a comprehensive inpatient rehab setting.  Physiatrist is providing close team supervision and 24 hour management of active medical problems listed below.  Physiatrist and rehab team continue to assess barriers to discharge/monitor patient progress toward functional and medical goals  Care Tool:  Bathing    Body parts bathed by patient: Right arm,Left arm,Chest,Abdomen,Front perineal area,Buttocks,Right upper leg,Left upper leg,Face   Body parts bathed by helper: Left lower leg,Right lower leg     Bathing assist Assist Level: Minimal Assistance - Patient > 75%     Upper Body Dressing/Undressing Upper body dressing   What is the patient wearing?: Pull over shirt    Upper body assist Assist Level: Set up assist    Lower Body Dressing/Undressing Lower body dressing      What is the patient wearing?: Pants  Lower body assist Assist for lower body dressing: Minimal Assistance - Patient > 75%     Toileting Toileting    Toileting assist Assist for toileting: Independent Assistive Device Comment: urinal   Transfers Chair/bed transfer  Transfers assist     Chair/bed transfer  assist level: Minimal Assistance - Patient > 75%     Locomotion Ambulation   Ambulation assist      Assist level: Moderate Assistance - Patient 50 - 74% Assistive device: No Device Max distance: 10'   Walk 10 feet activity   Assist     Assist level: Minimal Assistance - Patient > 75% Assistive device: Hand held assist   Walk 50 feet activity   Assist    Assist level: Moderate Assistance - Patient - 50 - 74% Assistive device: Hand held assist    Walk 150 feet activity   Assist Walk 150 feet activity did not occur: Safety/medical concerns (RLE trembling/fatigued)         Walk 10 feet on uneven surface  activity   Assist Walk 10 feet on uneven surfaces activity did not occur: Safety/medical concerns (RLE trembling/fatigue)         Wheelchair     Assist Will patient use wheelchair at discharge?: No      Wheelchair assist level: Supervision/Verbal cueing Max wheelchair distance: 50    Wheelchair 50 feet with 2 turns activity    Assist    Wheelchair 50 feet with 2 turns activity did not occur:  (fatigue)       Wheelchair 150 feet activity     Assist  Wheelchair 150 feet activity did not occur:  (fatigue)       Blood pressure 113/69, pulse 62, temperature 98.2 F (36.8 C), temperature source Oral, resp. rate 18, height 5\' 7"  (1.702 m), weight 74.3 kg, SpO2 98 %.  Medical Problem List and Plan: 1.  Debility secondary to perforated diverticulitis complicated by septic shock.  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  Wound care nurse follow-up for colostomy care and education             -patient may not shower             -ELOS/Goals: 05/19/20  mod I  -Continue therapies.   2.  Antithrombotics: -DVT/anticoagulation: Lovenox             -antiplatelet therapy: N/A 3. Pain Management:   12/22- pain controlled- d/c oxycodone 4. Mood: Provide emotional support             -antipsychotic  agents: N/A 5. Neuropsych: This patient is capable of making decisions on his own behalf. 6. Skin/Wound Care: Routine skin checks  abd incision healing well   -no podiatry here at hospital 7. Fluids/Electrolytes/Nutrition: encourage PO             CMP reviewed 12/20 and other than albumin/protein looks ok  -protein supps 8. Acute blood anemia             Hemoglobin 9.4 on 12/16---9.9 12/20 9.  Hypertension.  Lisinopril 40 mg daily, Norvasc 5 mg daily.    12/21- BP controlled- con't regimen Vitals:   05/13/20 1946 05/14/20 0410  BP: 113/80 113/69  Pulse: 89 62  Resp: 18 18  Temp: (!) 97.4 F (36.3 C) 98.2 F (36.8 C)  SpO2: 97% 98%   10.  Myasthenia gravis.  Prednisone 20 mg twice daily and Mestinon 30 mg every 8 hours.  Follow neurology services  for management  -wbc's up d/t steroids 11.  BPH.  Ditropan 5 mg daily, Proscar 5 mg daily             Monitor for retention 12.  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  SSI            CBGs ranging from 79-147: continue to monitor  13. Colostomy-  Healing well and functioning well  14. Abdominal pain: advised smaller meals, ordered KUB   LOS: 6 days A FACE TO FACE EVALUATION WAS PERFORMED  Charlett Blake 05/14/2020, 8:42 AM

## 2020-05-14 NOTE — Progress Notes (Signed)
Patient ID: Danny Walker, male   DOB: 1945-08-24, 74 y.o.   MRN: [id number]  SW met with pt and pt step dtr [name] in room to provide updates on d/c recommendations: HHPT/OT, RW and 3in1 BSC. Preferred [facility] is [facility]. SW informed will begin with this agency, and if unable to accept, will find an agency prior to d/c.   SW ordered DME: 3in1 BSC with [facility] via parachute.   SW sent referral to [facility] for HHPTOT/aide.  *SW received updates from [place] that the system shows he is active with [facility]. SW to follow-up with [facility] to get clarity on if this patient remains active with services.  SW waiting on updates from Amy/[facility] on if pt is active,and when certification period ends.   Loralee Pacas, MSW, [place] Office: [phone] Cell: [phone] Fax: [phone]

## 2020-05-14 NOTE — Consult Note (Signed)
[place] Nurse ostomy follow up Teaching is facilitated today through portable interpreter.  Step daughter is at bedside.  She is here to see patient work with PT but will not be participating in ostomy care at all after discharge.  She is [place] speaking. I inform her (and patient via interpreter) that he will need to be fully independent with ostomy care prior to discharge.  He is willing to return demonstrate this for me today.   Stoma type/location: LUQ colostomy  Patient removes old pouch and cleanses peristomal skin.  Pats skin dry.  Rationale is explained via interpreter and patient nods in acknowledgement. Patient applies barrier ring to peristomal skin after stretching to appropriate size.  Stomal assessment/size: 1 3/8"  Patient identifies this ring and cuts this independently, careful not to puncture scissors through front of pouch.  Peristomal assessment: intact  Midline staple line.  Treatment options for stomal/peristomal skin: barrier ring and 1 piece flat pouch Output soft brown stool Ostomy pouching: 1pc.flat with barrier ring .   Education provided: See above.  Once pouch applied, patient smoothes this down for secure fit and rolls pouch closed.  Encouraged to empty when 1/3 full and he nods in agreement.  Told this will be several times daily and pouch changes will be twice weekly and as needed for leaks. 5 pouch sets will be sent home at discharge.  And secure start information is provided. [place] and option for spanish speaking is circled and patient shown this and explained (via interpreter).  Patient nods in agreement.  Step daughter remained on opposite side of privacy curtain near the door and did not participate in teaching or demonstration.  Enrolled patient in [place] Start Discharge program: Yes Will not follow at this time.  Please re-consult if needed.  Patient is ready for discharge in regards to ostomy teaching.  Domenic Moras MSN, RN, FNP-BC CWON Wound, Ostomy,  [facility] Nurse Pager [phone]

## 2020-05-15 ENCOUNTER — Inpatient Hospital Stay (HOSPITAL_COMMUNITY): Payer: Medicare Other

## 2020-05-15 ENCOUNTER — Inpatient Hospital Stay (HOSPITAL_COMMUNITY): Payer: Medicare Other | Admitting: Physical Therapy

## 2020-05-15 LAB — CREATININE, SERUM
Creatinine, Ser: 0.97 mg/dL (ref 0.61–1.24)
GFR, Estimated: >60 mL/min (ref >60)

## 2020-05-15 LAB — GLUCOSE, CAPILLARY
Glucose-Capillary: 148 mg/dL — ABNORMAL HIGH (ref 70–99)
Glucose-Capillary: 150 mg/dL — ABNORMAL HIGH (ref 70–99)
Glucose-Capillary: 178 mg/dL — ABNORMAL HIGH (ref 70–99)
Glucose-Capillary: 189 mg/dL — ABNORMAL HIGH (ref 70–99)
Glucose-Capillary: 266 mg/dL — ABNORMAL HIGH (ref 70–99)
Glucose-Capillary: 62 mg/dL — ABNORMAL LOW (ref 70–99)
Glucose-Capillary: 92 mg/dL (ref 70–99)

## 2020-05-15 NOTE — Progress Notes (Signed)
Patient refused 0400 finger stick, so s/s hyper/hypo glycemia

## 2020-05-15 NOTE — Progress Notes (Signed)
Physical Therapy Session Note  Patient Details  Name: Danny Walker MRN: [id number] Date of Birth: 12/09/1945  Today's Date: 05/15/2020 PT Individual Time: 1300-1400 PT Individual Time Calculation (min): 60 min   Short Term Goals: Week 1:  PT Short Term Goal 1 (Week 1): = LTGs due to LOS  Skilled Therapeutic Interventions/Progress Updates:    Patient in recliner in room finished lunch.  C/o difficulty finishing lunch prior to 1pm session and needs time to digest his food.  Scheduler made aware.  Patient sit to stand with S.  Ambulated with RW x 180' to therapy gym.  Step ups to 6" step with rails x 10 on R and on L.  Standing balance at counter side stepping, forward march, heel raises and balance in tandem stand x 20-30 sec trials with CGA.  Patient ambulated to ortho gym performed BITS program for reaction time touching targets over screen with intermittent 1 vs no UE support 2 x 2 minutes.  Patient seated on Nu Step x 7 minutes UE/LE at level 3.  Patient performed car transfer to simulated Toyota Camry height with close S increased time for sit to stand from that height.  Neogitated ramp with RW and CGA.  Patient ambulated to room x 230' with RW and S.  Seated for LE therex LAQ, hip flexion, seated trunk flexion, scapular squeezes all x 10 and sit to stand x 5.  Discussed RN staff to walk with him on Christmas Day so he does not lose strength.  Left in recliner with call bell and needs in reach and chair alarm active.   Therapy Documentation Precautions:  Precautions Precautions: Fall,Other (comment) Precaution Comments: colostomy Restrictions Weight Bearing Restrictions: No        Therapy/Group: Individual Therapy  Reginia Naas  Magda Kiel, PT 05/15/2020, 3:41 PM

## 2020-05-15 NOTE — Progress Notes (Signed)
[place] PHYSICAL MEDICINE & REHABILITATION PROGRESS NOTE   Subjective/Complaints: No abd pain, no nausea or vomiting No problems during therpay yesterday   ROS: Neg CP, SOB, no limb pain Objective:   DG Abd 1 View  Result Date: 05/13/2020 CLINICAL DATA:  Abdominal pain EXAM: ABDOMEN - 1 VIEW COMPARISON:  May 01, 2020 FINDINGS: There is moderate stool in the colon. There is no bowel dilatation or air-fluid level to suggest bowel obstruction. A small focus of extraluminal air is seen in the right upper quadrant. There are skin staples overlying the lower abdomen and pelvis. There are surgical clips in the right upper quadrant. There is degenerative change in the lumbar spine with scoliosis. IMPRESSION: Small amount of apparent extraluminal air in the right upper quadrant which may represent residual postoperative air. This finding warrants close clinical and potential imaging surveillance. It may be prudent to obtain upright or left lateral decubitus abdominal image in particular to further evaluate given this finding. No findings indicative of bowel obstruction. Postoperative changes are noted. These results will be called to the ordering clinician or representative by the Radiologist Assistant, and communication documented in the PACS or [employer]. Electronically Signed   By: Lowella Grip III M.D.   On: 05/13/2020 17:01   No results for input(s): WBC, HGB, HCT, PLT in the last 72 hours. Recent Labs    05/15/20 0459  CREATININE 0.97    Intake/Output Summary (Last 24 hours) at 05/15/2020 0917 Last data filed at 05/15/2020 0825 Gross per 24 hour  Intake 358 ml  Output 1175 ml  Net -817 ml        Physical Exam: Vital Signs Blood pressure (!) 100/55, pulse 84, temperature 98.3 F (36.8 C), temperature source Oral, resp. rate 18, height 5\' 7"  (1.702 m), weight 74.3 kg, SpO2 98 %.   General: No acute distress Mood and affect are appropriate Heart: Regular rate  and rhythm no rubs murmurs or extra sounds Lungs: Clear to auscultation, breathing unlabored, no rales or wheezes Abdomen: Positive bowel sounds, soft nontender to palpation, nondistended Extremities: No clubbing, cyanosis, or edema Skin: Staples intact, colostomy bag and appliance are intact, full of loose lt brown stool No tenderness or erythema around appliance    Skin:    General: Skin is warm and dry. Toe nails are long  Neurological:   Motor: 4+/5 UE and LE--> 3+/5 prox to 4/5 distally. Sensory exam intact. Alert and oriented x 3. Normal insight and awareness. Intact Memory. Normal language and speech. Cranial nerve exam unremarkable    Assessment/Plan: 1. Functional deficits which require 3+ hours per day of interdisciplinary therapy in a comprehensive inpatient rehab setting.  Physiatrist is providing close team supervision and 24 hour management of active medical problems listed below.  Physiatrist and rehab team continue to assess barriers to discharge/monitor patient progress toward functional and medical goals  Care Tool:  Bathing    Body parts bathed by patient: Right arm,Left arm,Chest,Abdomen,Front perineal area,Buttocks,Right upper leg,Left upper leg,Face   Body parts bathed by helper: Left lower leg,Right lower leg     Bathing assist Assist Level: Minimal Assistance - Patient > 75%     Upper Body Dressing/Undressing Upper body dressing   What is the patient wearing?: Pull over shirt    Upper body assist Assist Level: Set up assist    Lower Body Dressing/Undressing Lower body dressing      What is the patient wearing?: Pants     Lower body assist Assist  for lower body dressing: Minimal Assistance - Patient > 75%     Chartered loss adjuster assist Assist for toileting: Independent Assistive Device Comment: urinal   Transfers Chair/bed transfer  Transfers assist     Chair/bed transfer assist level: Supervision/Verbal cueing      Locomotion Ambulation   Ambulation assist      Assist level: Supervision/Verbal cueing Assistive device: Walker-rolling Max distance: >250 ft   Walk 10 feet activity   Assist     Assist level: Supervision/Verbal cueing Assistive device: Walker-rolling   Walk 50 feet activity   Assist    Assist level: Supervision/Verbal cueing Assistive device: Walker-rolling    Walk 150 feet activity   Assist Walk 150 feet activity did not occur: Safety/medical concerns (RLE trembling/fatigued)  Assist level: Supervision/Verbal cueing Assistive device: Walker-rolling    Walk 10 feet on uneven surface  activity   Assist Walk 10 feet on uneven surfaces activity did not occur: Safety/medical concerns (RLE trembling/fatigue)         Wheelchair     Assist Will patient use wheelchair at discharge?: No      Wheelchair assist level: Supervision/Verbal cueing Max wheelchair distance: 50    Wheelchair 50 feet with 2 turns activity    Assist    Wheelchair 50 feet with 2 turns activity did not occur:  (fatigue)       Wheelchair 150 feet activity     Assist  Wheelchair 150 feet activity did not occur:  (fatigue)       Blood pressure (!) 100/55, pulse 84, temperature 98.3 F (36.8 C), temperature source Oral, resp. rate 18, height 5\' 7"  (1.702 m), weight 74.3 kg, SpO2 98 %.  Medical Problem List and Plan: 1.  Debility secondary to perforated diverticulitis complicated by septic shock.  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  Wound care nurse follow-up for colostomy care and education             -patient may not shower             -ELOS/Goals: 05/19/20  mod I  -Continue therapies.   2.  Antithrombotics: -DVT/anticoagulation: Lovenox             -antiplatelet therapy: N/A 3. Pain Management:   12/22- pain controlled- d/c oxycodone 4. Mood: Provide emotional support             -antipsychotic agents:  N/A 5. Neuropsych: This patient is capable of making decisions on his own behalf. 6. Skin/Wound Care: Routine skin checks  abd incision healing well   -no podiatry here at hospital 7. Fluids/Electrolytes/Nutrition: encourage PO             CMP reviewed 12/20 and other than albumin/protein looks ok  -protein supps 8. Acute blood anemia             Hemoglobin 9.4 on 12/16---9.9 12/20 9.  Hypertension.  Lisinopril 40 mg daily, Norvasc 5 mg daily.    12/21- BP controlled- con't regimen Vitals:   05/15/20 0434 05/15/20 0836  BP: (!) 114/58 (!) 100/55  Pulse: 84   Resp: 18   Temp: 98.3 F (36.8 C)   SpO2: 98%    10.  Myasthenia gravis.  Prednisone 20 mg twice daily and Mestinon 30 mg every 8 hours.  Follow neurology services for management  -wbc's up d/t steroids 11.  BPH.  Ditropan 5 mg daily, Proscar 5 mg daily  Monitor for retention 12.  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  SSI            CBGs ranging from 79-147: continue to monitor  13. Colostomy-  Healing well and functioning well No abd pain  14. Abdominal pain: advised smaller meals, ordered KUB   LOS: 7 days A FACE TO FACE EVALUATION WAS PERFORMED  Erick Colace 05/15/2020, 9:17 AM

## 2020-05-15 NOTE — Progress Notes (Signed)
Patient ID: Danny Walker, male   DOB: 17-Sep-1945, 73 y.o.   MRN: [id number]  SW waiting on updates from Amy/[facility] on if pt is active,and when certification period ends.   Loralee Pacas, MSW, [place] Office: [phone] Cell: [phone] Fax: [phone]

## 2020-05-15 NOTE — Progress Notes (Signed)
Physical Therapy Session Note  Patient Details  Name: Danny Walker MRN: [id number] Date of Birth: 10/14/1945  Today's Date: 05/15/2020 PT Individual Time: 0900-1000 PT Individual Time Calculation (min): 60 min   Short Term Goals: Week 1:  PT Short Term Goal 1 (Week 1): = LTGs due to LOS  Skilled Therapeutic Interventions/Progress Updates:    Pt received seated in recliner in room, agreeable to PT session. No complaints of pain. Sit to stand with Supervision to RW. Ambulation at Supervision level with use of RW 250 ft (+) throughout session. Trial ambulation with no AD and CGA to close Supervision, 2 x 150 ft. Pt reports onset of fatigue in BLE without use of AD. Education with patient regarding energy conservation and use of RW to conserve energy. Pt agreeable to utilize RW upon d/c home. Ambulation through obstacle course with use of RW at [facility] level ascend/descend 4" curb step, stepping over obstacles, balancing on airex with no UE support, and performing alt L/R cone taps with no UE support and min A needed. Standing alt L/R 4" step taps forwards and laterally, 2 x 15 reps each with CGA and no UE support for balance. Pt requesting assistance with scheduling his meals for the next day while interpreter present. Assisted pt with calling cafeteria and placing meal orders. Pt left seated in recliner in room with needs in reach, chair alarm in place at end of session. Interpreter present throughout session.  Therapy Documentation Precautions:  Precautions Precautions: Fall,Other (comment) Precaution Comments: colostomy Restrictions Weight Bearing Restrictions: No    Therapy/Group: Individual Therapy   Excell Seltzer, PT, DPT  05/15/2020, 12:24 PM

## 2020-05-15 NOTE — Progress Notes (Signed)
Occupational Therapy Session Note  Patient Details  Name: Danny Walker MRN: [id number] Date of Birth: Dec 24, 1945  Today's Date: 05/15/2020 OT Individual Time: 1100-1200 OT Individual Time Calculation (min): 60 min    Short Term Goals: Week 1:  OT Short Term Goal 1 (Week 1): Pt will complete LB bathing and dressing with use of AE and close supervision for two consecutive sessions. OT Short Term Goal 2 (Week 1): Pt will complete toilet transfers with close supervision to elevated toilet or 3:1. OT Short Term Goal 3 (Week 1): Pt will complete 3 grooming tasks in standing with supervision.  Skilled Therapeutic Interventions/Progress Updates:    1:1. Pt received in recliner agreeable to OT. Attempted to have pt engage in pill box organization of beads for "pills" with color of pill and instructions written in [place]. Pt unable to complete correctly reporting "this is not how I do it at home" and "I need the name of the pull and it written in spanish" despite all instructions written in [place]. Terminated task and will initiate trial with list of current medications another day. In ADL apartment, pt unable to locate 5/5 safety issues in kitchen without examples "what could you hit you head on, trip over or what smells wron (stove on)" with pt perseverating on layout of his kitchen being different than the ADL apartment. Pt and OT discuss using stool when cooking for energy conservation and decreasing oven use d/t not being able to bed well d/t surgery. Pt verbalized understanding. Exited session with pt seated in recliner, exit alarm on and call light in reach   Therapy Documentation Precautions:  Precautions Precautions: Fall,Other (comment) Precaution Comments: colostomy Restrictions Weight Bearing Restrictions: No General:   Vital Signs: Therapy Vitals BP: (!) 100/55 Pain: Pain Assessment Pain Scale: 0-10 Pain Score: 0-No pain ADL: ADL Eating: Independent Where  Assessed-Eating: Bed level Grooming: Setup Where Assessed-Grooming: Edge of bed Upper Body Bathing: Supervision/safety Where Assessed-Upper Body Bathing: Edge of bed Lower Body Bathing: Minimal assistance Where Assessed-Lower Body Bathing: Edge of bed Upper Body Dressing: Supervision/safety Where Assessed-Upper Body Dressing: Edge of bed Lower Body Dressing: Moderate assistance Where Assessed-Lower Body Dressing: Edge of bed Toileting: Minimal assistance Where Assessed-Toileting: Glass blower/designer: Psychiatric nurse Method: Arts development officer: Raised toilet seat Tub/Shower Transfer: Not assessed Social research officer, government: Not assessed Vision   Perception    Praxis   Exercises:   Other Treatments:     Therapy/Group: Individual Therapy  Tonny Branch 05/15/2020, 12:18 PM

## 2020-05-16 LAB — GLUCOSE, CAPILLARY
Glucose-Capillary: 123 mg/dL — ABNORMAL HIGH (ref 70–99)
Glucose-Capillary: 220 mg/dL — ABNORMAL HIGH (ref 70–99)
Glucose-Capillary: 85 mg/dL (ref 70–99)
Glucose-Capillary: 151 mg/dL — ABNORMAL HIGH (ref 70–99)
Glucose-Capillary: 135 mg/dL — ABNORMAL HIGH (ref 70–99)

## 2020-05-16 NOTE — Plan of Care (Signed)
  Problem: Consults Goal: RH GENERAL PATIENT EDUCATION Description: See Patient Education module for education specifics. Outcome: Progressing Goal: Skin Care Protocol Initiated - if Braden Score 18 or less Description: If consults are not indicated, leave blank or document N/A Outcome: Progressing   Problem: RH BOWEL ELIMINATION Goal: RH STG MANAGE BOWEL WITH ASSISTANCE Description: STG Manage Bowel with mod I Assistance. Outcome: Progressing   Problem: RH SKIN INTEGRITY Goal: RH STG SKIN FREE OF INFECTION/BREAKDOWN Description: Skin to remain free from additional breakdown with mod I assist while on rehab. Outcome: Progressing Goal: RH STG MAINTAIN SKIN INTEGRITY WITH ASSISTANCE Description: STG Maintain Skin Integrity With mod I Assistance. Outcome: Progressing Goal: RH STG ABLE TO PERFORM INCISION/WOUND CARE W/ASSISTANCE Description: STG Able To Perform Incision/Wound Care With mod I Assistance. Outcome: Progressing   Problem: RH SAFETY Goal: RH STG ADHERE TO SAFETY PRECAUTIONS W/ASSISTANCE/DEVICE Description: STG Adhere to Safety Precautions With mod I Assistance/Device. Outcome: Progressing   Problem: RH PAIN MANAGEMENT Goal: RH STG PAIN MANAGED AT OR BELOW PT'S PAIN GOAL Description: <4 on a 0-10 pain scale Outcome: Progressing   Problem: RH KNOWLEDGE DEFICIT GENERAL Goal: RH STG INCREASE KNOWLEDGE OF SELF CARE AFTER HOSPITALIZATION Description: Patient will demonstrate knowledge of medication management skin/wound care management, ostomy care management with educational materials and handouts with cues and reminders from staff. Outcome: Progressing   

## 2020-05-17 LAB — GLUCOSE, CAPILLARY
Glucose-Capillary: 118 mg/dL — ABNORMAL HIGH (ref 70–99)
Glucose-Capillary: 122 mg/dL — ABNORMAL HIGH (ref 70–99)
Glucose-Capillary: 134 mg/dL — ABNORMAL HIGH (ref 70–99)
Glucose-Capillary: 176 mg/dL — ABNORMAL HIGH (ref 70–99)
Glucose-Capillary: 82 mg/dL (ref 70–99)
Glucose-Capillary: 95 mg/dL (ref 70–99)

## 2020-05-17 MED ORDER — SIMETHICONE 80 MG PO CHEW
80.0000 mg | CHEWABLE_TABLET | Freq: Four times a day (QID) | ORAL | Status: DC | PRN
  Administered 2020-05-17: 80 mg via ORAL
  Filled 2020-05-17: qty 1

## 2020-05-17 NOTE — Discharge Summary (Signed)
Physician Discharge Summary  Patient ID: Danny Walker MRN: [id number] DOB/AGE: [birth year] 74 y.o.  Admit date: 05/08/2020 Discharge date: 05/19/2020  Discharge Diagnoses:  Principal Problem:   Debility Diverticulitis complicated by septic shock DVT prophylaxis Acute blood loss anemia Hypertension Myasthenia gravis BPH  Discharged Condition: Stable  Significant Diagnostic Studies: DG Abd 1 View  Result Date: 05/13/2020 CLINICAL DATA:  Abdominal pain EXAM: ABDOMEN - 1 VIEW COMPARISON:  May 01, 2020 FINDINGS: There is moderate stool in the colon. There is no bowel dilatation or air-fluid level to suggest bowel obstruction. A small focus of extraluminal air is seen in the right upper quadrant. There are skin staples overlying the lower abdomen and pelvis. There are surgical clips in the right upper quadrant. There is degenerative change in the lumbar spine with scoliosis. IMPRESSION: Small amount of apparent extraluminal air in the right upper quadrant which may represent residual postoperative air. This finding warrants close clinical and potential imaging surveillance. It may be prudent to obtain upright or left lateral decubitus abdominal image in particular to further evaluate given this finding. No findings indicative of bowel obstruction. Postoperative changes are noted. These results will be called to the ordering clinician or representative by the Radiologist Assistant, and communication documented in the PACS or [employer]. Electronically Signed   By: Lowella Grip III M.D.   On: 05/13/2020 17:01   MR THORACIC SPINE WO CONTRAST  Result Date: 05/05/2020 CLINICAL DATA:  Leg weakness and urinary retention. EXAM: MRI THORACIC AND LUMBAR SPINE WITHOUT CONTRAST TECHNIQUE: Multiplanar and multiecho pulse sequences of the thoracic and lumbar spine were obtained without intravenous contrast. COMPARISON:  None. FINDINGS: MRI THORACIC SPINE FINDINGS Alignment:  Physiologic.  Vertebrae: No fracture, evidence of discitis, or bone lesion. Cord:  Normal signal and morphology. Paraspinal and other soft tissues: Small pleural effusions Disc levels: T7-8: Small right subarticular disc protrusion without stenosis. T9-10: Small central disc protrusion and annular fissure. No stenosis. MRI LUMBAR SPINE FINDINGS Segmentation:  Standard. Alignment:  Dextroscoliosis with apex at L3 Vertebrae:  No fracture, evidence of discitis, or bone lesion. Conus medullaris and cauda equina: Conus extends to the L1 level. Conus and cauda equina appear normal. Paraspinal and other soft tissues: Negative Disc levels: L1-L2: Left asymmetric disc bulge and left-greater-than-right facet hypertrophy. Left lateral recess narrowing without central spinal canal stenosis. No right, moderate left neural foraminal stenosis. L2-L3: Small left asymmetric disc bulge. No spinal canal stenosis. No neural foraminal stenosis. L3-L4: Right asymmetric disc bulge and mild facet hypertrophy. Right lateral recess narrowing without central spinal canal stenosis. No right, but mild left neural foraminal stenosis. L4-L5: Severe facet hypertrophy with right asymmetric disc bulge. Right lateral recess narrowing without central spinal canal stenosis. Severe right and mild left neural foraminal stenosis. L5-S1: Right asymmetric disc bulge. No spinal canal stenosis. Severe right and no left neural foraminal stenosis. Visualized sacrum: Normal. IMPRESSION: 1. No acute abnormality of the thoracic spine. No spinal canal or neural foraminal stenosis. 2. 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 3. 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 4. Multilevel lateral recess narrowing without central spinal canal stenosis. Electronically Signed   By: Ulyses Jarred M.D.   On: 05/05/2020 20:32   MR LUMBAR SPINE WO CONTRAST  Result Date: 05/05/2020 CLINICAL DATA:  Leg weakness and urinary retention. EXAM: MRI THORACIC AND LUMBAR SPINE WITHOUT  CONTRAST TECHNIQUE: Multiplanar and multiecho pulse sequences of the thoracic and lumbar spine were obtained without intravenous contrast. COMPARISON:  None. FINDINGS:  MRI THORACIC SPINE FINDINGS Alignment:  Physiologic. Vertebrae: No fracture, evidence of discitis, or bone lesion. Cord:  Normal signal and morphology. Paraspinal and other soft tissues: Small pleural effusions Disc levels: T7-8: Small right subarticular disc protrusion without stenosis. T9-10: Small central disc protrusion and annular fissure. No stenosis. MRI LUMBAR SPINE FINDINGS Segmentation:  Standard. Alignment:  Dextroscoliosis with apex at L3 Vertebrae:  No fracture, evidence of discitis, or bone lesion. Conus medullaris and cauda equina: Conus extends to the L1 level. Conus and cauda equina appear normal. Paraspinal and other soft tissues: Negative Disc levels: L1-L2: Left asymmetric disc bulge and left-greater-than-right facet hypertrophy. Left lateral recess narrowing without central spinal canal stenosis. No right, moderate left neural foraminal stenosis. L2-L3: Small left asymmetric disc bulge. No spinal canal stenosis. No neural foraminal stenosis. L3-L4: Right asymmetric disc bulge and mild facet hypertrophy. Right lateral recess narrowing without central spinal canal stenosis. No right, but mild left neural foraminal stenosis. L4-L5: Severe facet hypertrophy with right asymmetric disc bulge. Right lateral recess narrowing without central spinal canal stenosis. Severe right and mild left neural foraminal stenosis. L5-S1: Right asymmetric disc bulge. No spinal canal stenosis. Severe right and no left neural foraminal stenosis. Visualized sacrum: Normal. IMPRESSION: 1. No acute abnormality of the thoracic spine. No spinal canal or neural foraminal stenosis. 2. 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 3. 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 4. Multilevel lateral recess narrowing without central spinal canal stenosis.  Electronically Signed   By: Ulyses Jarred M.D.   On: 05/05/2020 20:32   CT ABDOMEN PELVIS W CONTRAST  Result Date: 05/01/2020 CLINICAL DATA:  Abdominal distension.  Bowel obstruction suspected. EXAM: CT ABDOMEN AND PELVIS WITH CONTRAST TECHNIQUE: Multidetector CT imaging of the abdomen and pelvis was performed using the standard protocol following bolus administration of intravenous contrast. CONTRAST:  145mL OMNIPAQUE IOHEXOL 300 MG/ML  SOLN COMPARISON:  Radiography same day FINDINGS: Lower chest: Mild pneumomediastinum tracking up from the retroperitoneum. Pendant atelectasis in both lower lobes. Hepatobiliary: Previous cholecystectomy. 1 cm cyst or hemangioma in the right lobe. Pancreas: Normal Spleen: Normal Adrenals/Urinary Tract: Adrenal glands are normal. Left kidney is normal. Right kidney contains a 3 cm cyst in the lower pole but is otherwise normal. Bladder is normal. Stomach/Bowel: I think that there is sigmoid diverticulitis with a perforated diverticulum resulting in a large amount of gas primarily in the sigmoid mesocolon and retroperitoneum with some extension into the main mesentery. No free intraperitoneal air. No intraperitoneal fluid. Vascular/Lymphatic: Aortic atherosclerosis. No aneurysm. IVC is normal. No adenopathy. Reproductive: Enlarged prostate.  Possible TURP defect. Other: None Musculoskeletal: Scoliotic curvature and degenerative change of the spine. IMPRESSION: 1. Acute sigmoid diverticulitis with a perforated diverticulum resulting in a large amount of gas primarily in the sigmoid mesocolon and retroperitoneum with some extension into the main mesentery. No free intraperitoneal air. No intraperitoneal fluid. 2. Mild pneumomediastinum tracking up from the retroperitoneum. 3. Aortic atherosclerosis. 4. Enlarged prostate. Possible TURP defect. 5. Critical Value/emergent results were called by telephone at the time of interpretation on 05/01/2020 at 10:09 am to provider [facility] ,  who verbally acknowledged these results. Aortic Atherosclerosis (ICD10-I70.0). Electronically Signed   By: Nelson Chimes M.D.   On: 05/01/2020 10:07   DG Chest Port 1 View  Result Date: 05/01/2020 CLINICAL DATA:  Endotracheal tube placement. EXAM: PORTABLE CHEST 1 VIEW COMPARISON:  Same day. FINDINGS: Stable cardiomegaly. Endotracheal tube is directed into right mainstem bronchus; withdrawal by 3-4 cm is recommended.  Nasogastric tube is seen entering stomach. No pneumothorax is noted. Mild bibasilar subsegmental atelectasis is noted with probable small pleural effusion. Bony thorax is unremarkable. IMPRESSION: Endotracheal tube is directed into right mainstem bronchus; withdrawal by 3-4 cm is recommended. These results will be called to the ordering clinician or representative by the Radiologist Assistant, and communication documented in the PACS or [employer]. Mild bibasilar subsegmental atelectasis is noted with probable small pleural effusion. Electronically Signed   By: Marijo Conception M.D.   On: 05/01/2020 15:49   DG Chest Port 1 View  Result Date: 05/01/2020 CLINICAL DATA:  Possible sepsis EXAM: PORTABLE CHEST 1 VIEW COMPARISON:  03/02/2020 FINDINGS: Low lung volumes. Minimal atelectasis/scarring at the lung bases. No significant pleural effusion. No pneumothorax. Stable cardiomediastinal contours with top-normal heart size. IMPRESSION: Minimal atelectasis/scarring at the lung bases. Electronically Signed   By: Macy Mis M.D.   On: 05/01/2020 09:42   DG Abd Portable 1 View  Result Date: 05/01/2020 CLINICAL DATA:  Questionable sepsis. EXAM: PORTABLE ABDOMEN - 1 VIEW COMPARISON:  04/06/2018 FINDINGS: There is no bowel dilation to suggest obstruction or significant adynamic ileus. Status post cholecystectomy. Abdominal and pelvic soft tissues are otherwise unremarkable. No acute skeletal abnormality. IMPRESSION: 1. No acute findings.  No evidence of bowel obstruction. Electronically  Signed   By: Lajean Manes M.D.   On: 05/01/2020 09:42    Labs:  Basic Metabolic Panel: Recent Labs  Lab 05/15/20 0459  CREATININE 0.97    CBC: No results for input(s): WBC, NEUTROABS, HGB, HCT, MCV, PLT in the last 168 hours.  CBG: Recent Labs  Lab 05/18/20 1214 05/18/20 1654 05/18/20 2012 05/19/20 0031 05/19/20 0446  GLUCAP 132* 163* 121* 135* 106*   Family history.  Unobtainable  Brief HPI:   Danny Walker is a 74 y.o. right-handed non-English-speaking male with history of reported myasthenia gravis on prednisone as well as hypertension BPH.  Per chart review patient lives alone 1 level home with ramped entrance.  He has a Psychiatrist in the area.  Reportedly independent prior to admission.  Presented 05/01/2020 with extreme constipation as well as abdominal pain no nausea or vomiting.  In the ED he was hypotensive as well as tachycardic.  He did receive a bolus of IV fluids.  Noted low-grade fever 100.1.  Underwent CT scan that revealed diverticulitis with retroperitoneal free air some of the pelvis.  No abscess noted.  Patient underwent sigmoid resection with Jeanette Caprice pouch as well as biopsy of 2 lymph nodes that showed no dysplasia or malignancy on 05/01/2020 per Dr.Tsuei.  Wound care nurse consulted for ostomy care and education.  Hospital course complicated by respiratory failure requiring intubation.  He was extubated 05/02/2020.  Neurology follow-up for management of patient's myasthenia gravis.  Acute blood loss anemia 9.4 and monitored.  He was cleared to begin Lovenox for DVT prophylaxis during his hospital course.  Due to patient's pronounced debilitation after complicated surgical procedures patient was admitted for a comprehensive rehab program   Hospital Course: Danny Walker was admitted to rehab 05/08/2020 for inpatient therapies to consist of PT, ST and OT at least three hours five days a week. Past admission physiatrist, therapy team and rehab RN have  worked together to provide customized collaborative inpatient rehab.  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  He would follow-up with general surgery.  Wound care nurse follow-up for colostomy care  and education.  Lovenox for DVT prophylaxis no bleeding episodes.  Pain management use with oxycodone which was later discontinued.  Acute blood loss anemia 9.4-9.9 no bleeding episodes.  Blood pressures controlled on lisinopril as well as Norvasc follow-up outpatient.  Patient did have a documented history of myasthenia gravis prednisone Mestinon as directed follow-up neurology services.  BPH no urgency or frequency Ditropan Proscar as directed monitor for retention.  He did have some elevated blood sugars related to chronic steroids and monitored.   Blood pressures were monitored on TID basis and controlled     Rehab course: During patient's stay in rehab weekly team conferences were held to monitor patient's progress, set goals and discuss barriers to discharge. At admission, patient required moderate assist sit to stand minimal guard 100 feet rolling walker mod assist supine to sit.  Minimal assist upper body bathing mod assist lower body bathing set up upper body dressing moderate assist lower body dressing  Physical exam.  Blood pressure 133/77 pulse 68 temperature 98.7 respirations 18 oxygen saturation 94% room air Constitutional.  No acute distress.  Non-English-speaking. HEENT Head.  Normocephalic and atraumatic Eyes.  Pupils round and reactive to light no discharge.nystagmus Neck.  Supple nontender no JVD without thyromegaly Cardiac regular rate rhythm without extra sounds or murmur heard Abdomen.  Soft nontender positive bowel sounds colostomy in place Respiratory effort normal no respiratory distress without wheeze Musculoskeletal no edema or tenderness  extremities Neurologic.  Alert motor strength 4+/5 throughout  He/  has had improvement in activity tolerance, balance, postural control as well as ability to compensate for deficits. He/ has had improvement in functional use RUE/LUE  and RLE/LLE as well as improvement in awareness.  Patient ambulates 180 feet to the therapy gym with rolling walker.  Steps up 16 steps right to left supervision.  He can increase his ambulation up to 230 feet during sessions with a rolling walker and supervision.  In the ADL apartment patient able to locate items in the kitchen with supervision.  He did require some increased time to complete ADLs.  Full teaching completed plan discharge to home       Disposition: Discharge to home    Diet: Soft  Special Instructions: No driving smoking or alcohol  Routine colostomy care  Medications at discharge 1.  Tylenol as needed 2.  Norvasc 5 mg p.o. daily 3.  Proscar 5 mg daily 4.  Lisinopril 40 mg p.o. daily 5.  Ditropan 5 mg p.o. daily 6.  Protonix 40 mg p.o. daily 7.  Prednisone 20 mg p.o. twice daily 8.  Mestinon 30 mg p.o. every 8 hours  30-35 minutes were spent completing discharge summary and discharge planning  Discharge Instructions    Ambulatory referral to Neurology   Complete by: As directed    An appointment is requested in approximately 4 weeks myasthenia gravis   Ambulatory referral to Physical Medicine Rehab   Complete by: As directed    Moderate complexity follow-up 2 to 3 weeks debility with myasthenia gravis       Follow-up Information    Raulkar, Clide Deutscher, MD Follow up.   Specialty: Physical Medicine and Rehabilitation Why: Only as needed Contact information: [id number] [place] [address] [phone]        Donnie Mesa, MD Follow up.   Specialty: General Surgery Why: Call for appointment Contact information: [address] [place] [address] [phone]  Signed: Lavon Paganini Donnita Farina 05/19/2020, 5:10 AM

## 2020-05-17 NOTE — Plan of Care (Signed)
  Problem: Consults Goal: RH GENERAL PATIENT EDUCATION Description: See Patient Education module for education specifics. Outcome: Progressing Goal: Skin Care Protocol Initiated - if Braden Score 18 or less Description: If consults are not indicated, leave blank or document N/A Outcome: Progressing   Problem: RH BOWEL ELIMINATION Goal: RH STG MANAGE BOWEL WITH ASSISTANCE Description: STG Manage Bowel with mod I Assistance. Outcome: Progressing   Problem: RH SKIN INTEGRITY Goal: RH STG SKIN FREE OF INFECTION/BREAKDOWN Description: Skin to remain free from additional breakdown with mod I assist while on rehab. Outcome: Progressing Goal: RH STG MAINTAIN SKIN INTEGRITY WITH ASSISTANCE Description: STG Maintain Skin Integrity With mod I Assistance. Outcome: Progressing Goal: RH STG ABLE TO PERFORM INCISION/WOUND CARE W/ASSISTANCE Description: STG Able To Perform Incision/Wound Care With mod I Assistance. Outcome: Progressing   Problem: RH SAFETY Goal: RH STG ADHERE TO SAFETY PRECAUTIONS W/ASSISTANCE/DEVICE Description: STG Adhere to Safety Precautions With mod I Assistance/Device. Outcome: Progressing   Problem: RH PAIN MANAGEMENT Goal: RH STG PAIN MANAGED AT OR BELOW PT'S PAIN GOAL Description: <4 on a 0-10 pain scale Outcome: Progressing   Problem: RH KNOWLEDGE DEFICIT GENERAL Goal: RH STG INCREASE KNOWLEDGE OF SELF CARE AFTER HOSPITALIZATION Description: Patient will demonstrate knowledge of medication management skin/wound care management, ostomy care management with educational materials and handouts with cues and reminders from staff. Outcome: Progressing   

## 2020-05-17 NOTE — Progress Notes (Addendum)
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  Ambulated with staff without problems this am  Denies nausea or vomiting , liquidy stool in colostomy bag  ROS: Neg CP, SOB, no limb pain Objective:   No results found. No results for input(s): WBC, HGB, HCT, PLT in the last 72 hours. Recent Labs    05/15/20 0459  CREATININE 0.97    Intake/Output Summary (Last 24 hours) at 05/17/2020 0924 Last data filed at 05/17/2020 0730 Gross per 24 hour  Intake 598 ml  Output 600 ml  Net -2 ml        Physical Exam: Vital Signs Blood pressure 115/69, pulse 71, temperature 98.5 F (36.9 C), resp. rate 16, height 5\' 7"  (1.702 m), weight 74.3 kg, SpO2 99 %.   General: No acute distress Mood and affect are appropriate Heart: Regular rate and rhythm no rubs murmurs or extra sounds Lungs: Clear to auscultation, breathing unlabored, no rales or wheezes Abdomen: Positive bowel sounds, soft nontender to palpation, mildly distended Extremities: No clubbing, cyanosis, or edema Skin: Staples intact, colostomy bag and appliance are intact, full of loose loose brown stool No tenderness or erythema around appliance    Skin:    General: Skin is warm and dry. Toe nails are long  Neurological:   Motor: 4+/5 UE and LE--> 3+/5 prox to 4/5 distally. Sensory exam intact. Alert and oriented x 3. Normal insight and awareness. Intact Memory. Normal language and speech. Cranial nerve exam unremarkable    Assessment/Plan: 1. Functional deficits which require 3+ hours per day of interdisciplinary therapy in a comprehensive inpatient rehab setting.  Physiatrist is providing close team supervision and 24 hour management of active medical problems listed below.  Physiatrist and rehab team continue to assess barriers to discharge/monitor patient progress toward functional  and medical goals  Care Tool:  Bathing    Body parts bathed by patient: Right arm,Left arm,Chest,Abdomen,Front perineal area,Buttocks,Right upper leg,Left upper leg,Face   Body parts bathed by helper: Left lower leg,Right lower leg     Bathing assist Assist Level: Minimal Assistance - Patient > 75%     Upper Body Dressing/Undressing Upper body dressing   What is the patient wearing?: Pull over shirt    Upper body assist Assist Level: Set up assist    Lower Body Dressing/Undressing Lower body dressing      What is the patient wearing?: Pants     Lower body assist Assist for lower body dressing: Minimal Assistance - Patient > 75%     Toileting Toileting    Toileting assist Assist for toileting: Independent Assistive Device Comment: urinal   Transfers Chair/bed transfer  Transfers assist     Chair/bed transfer assist level: Supervision/Verbal cueing     Locomotion Ambulation   Ambulation assist      Assist level: Supervision/Verbal cueing Assistive device: Walker-rolling Max distance: 180'   Walk 10 feet activity   Assist     Assist level: Supervision/Verbal cueing Assistive device: Walker-rolling   Walk 50 feet activity   Assist    Assist level: Supervision/Verbal cueing Assistive device: Walker-rolling    Walk 150 feet activity   Assist Walk 150 feet activity did not occur: Safety/medical concerns (RLE trembling/fatigued)  Assist level: Supervision/Verbal cueing Assistive device: Walker-rolling    Walk 10 feet on uneven surface  activity   Assist Walk 10 feet on uneven surfaces activity  did not occur: Safety/medical concerns (RLE trembling/fatigue)   Assist level: Supervision/Verbal cueing Assistive device: Photographer Will patient use wheelchair at discharge?: No      Wheelchair assist level: Supervision/Verbal cueing Max wheelchair distance: 50    Wheelchair 50 feet with 2 turns  activity    Assist    Wheelchair 50 feet with 2 turns activity did not occur:  (fatigue)       Wheelchair 150 feet activity     Assist  Wheelchair 150 feet activity did not occur:  (fatigue)       Blood pressure 115/69, pulse 71, temperature 98.5 F (36.9 C), resp. rate 16, height 5\' 7"  (1.702 m), weight 74.3 kg, SpO2 99 %.  Medical Problem List and Plan: 1.  Debility secondary to perforated diverticulitis complicated by septic shock.  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  Wound care nurse follow-up for colostomy care and education             -patient may not shower             -ELOS/Goals: 05/19/20  mod I  -Continue therapies.   2.  Antithrombotics: -DVT/anticoagulation: Lovenox             -antiplatelet therapy: N/A 3. Pain Management:   12/22- pain controlled- d/c oxycodone 4. Mood: Provide emotional support             -antipsychotic agents: N/A 5. Neuropsych: This patient is capable of making decisions on his own behalf. 6. Skin/Wound Care: Routine skin checks  abd incision healing well   -no podiatry here at hospital 7. Fluids/Electrolytes/Nutrition: encourage PO             CMP reviewed 12/20 and other than albumin/protein looks ok  -protein supps 8. Acute blood anemia             Hemoglobin 9.4 on 12/16---9.9 12/20 9.  Hypertension.  Lisinopril 40 mg daily, Norvasc 5 mg daily.    12/21- BP controlled- con't regimen Vitals:   05/16/20 2005 05/17/20 0448  BP: 136/69 115/69  Pulse: 71 71  Resp: 16 16  Temp: 99.7 F (37.6 C) 98.5 F (36.9 C)  SpO2: 100% 99%  controlled  10.  Myasthenia gravis.  Prednisone 20 mg twice daily and Mestinon 30 mg every 8 hours.  Follow neurology services for management  -wbc's up d/t steroids 11.  BPH.  Ditropan 5 mg daily, Proscar 5 mg daily             Monitor for retention 12.  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

## 2020-05-18 ENCOUNTER — Inpatient Hospital Stay (HOSPITAL_COMMUNITY): Payer: Medicare Other | Admitting: Occupational Therapy

## 2020-05-18 ENCOUNTER — Ambulatory Visit: Payer: Medicare Other | Admitting: Neurology

## 2020-05-18 ENCOUNTER — Inpatient Hospital Stay (HOSPITAL_COMMUNITY): Payer: Medicare Other

## 2020-05-18 ENCOUNTER — Other Ambulatory Visit (HOSPITAL_COMMUNITY): Payer: Self-pay | Admitting: Physician Assistant

## 2020-05-18 LAB — GLUCOSE, CAPILLARY
Glucose-Capillary: 117 mg/dL — ABNORMAL HIGH (ref 70–99)
Glucose-Capillary: 121 mg/dL — ABNORMAL HIGH (ref 70–99)
Glucose-Capillary: 127 mg/dL — ABNORMAL HIGH (ref 70–99)
Glucose-Capillary: 132 mg/dL — ABNORMAL HIGH (ref 70–99)
Glucose-Capillary: 163 mg/dL — ABNORMAL HIGH (ref 70–99)
Glucose-Capillary: 84 mg/dL (ref 70–99)

## 2020-05-18 MED ORDER — AMLODIPINE BESYLATE 5 MG PO TABS: 5.0000 mg | ORAL_TABLET | Freq: Every day | ORAL | 11 refills | Status: DC

## 2020-05-18 MED ORDER — OSCAL 500/200 D-3 500-200 MG-UNIT PO TABS: 1.0000 | ORAL_TABLET | Freq: Two times a day (BID) | ORAL | 11 refills | Status: DC

## 2020-05-18 MED ORDER — LISINOPRIL 40 MG PO TABS: ORAL_TABLET | ORAL | 3 refills | Status: DC

## 2020-05-18 MED ORDER — LIDOCAINE-PRILOCAINE 2.5-2.5 % EX CREA: TOPICAL_CREAM | Freq: Once | CUTANEOUS | Status: DC

## 2020-05-18 MED ORDER — OMEPRAZOLE 40 MG PO CPDR: DELAYED_RELEASE_CAPSULE | ORAL | 3 refills | Status: DC

## 2020-05-18 MED ORDER — ACETAMINOPHEN 325 MG PO TABS: 650.0000 mg | ORAL_TABLET | Freq: Four times a day (QID) | ORAL | Status: DC | PRN

## 2020-05-18 MED ORDER — PREDNISONE 20 MG PO TABS: 20.0000 mg | ORAL_TABLET | Freq: Two times a day (BID) | ORAL | 0 refills | Status: DC

## 2020-05-18 MED ORDER — OXYBUTYNIN CHLORIDE 5 MG PO TABS: 5.0000 mg | ORAL_TABLET | Freq: Every day | ORAL | 0 refills | Status: DC

## 2020-05-18 MED ORDER — FERROUS SULFATE 325 (65 FE) MG PO TABS: 325.0000 mg | ORAL_TABLET | Freq: Every day | ORAL | 3 refills | Status: DC

## 2020-05-18 MED ORDER — PYRIDOSTIGMINE BROMIDE 60 MG PO TABS: 30.0000 mg | ORAL_TABLET | Freq: Three times a day (TID) | ORAL | 0 refills | Status: DC

## 2020-05-18 MED ORDER — FINASTERIDE 5 MG PO TABS: 5.0000 mg | ORAL_TABLET | ORAL | 3 refills | Status: DC

## 2020-05-18 MED FILL — predniSONE 20 MG TABS: 20 | 30 days supply | Qty: 60 | Fill #0

## 2020-05-18 MED FILL — FINASTERIDE 5 MG TABLET: 5 | 90 days supply | Qty: 90 | Fill #0

## 2020-05-18 MED FILL — LISINOPRIL 40 MG TABS: 40 | 90 days supply | Qty: 90 | Fill #0

## 2020-05-18 MED FILL — FERROUS SULFATE 325 MG TAB: 325 (65 FE) | 30 days supply | Qty: 30 | Fill #0

## 2020-05-18 NOTE — Progress Notes (Signed)
[place] PHYSICAL MEDICINE & REHABILITATION PROGRESS NOTE   Subjective/Complaints: Tolerated PT well today. Asks about return to driving- discussed with Cherie- patient continues to have delays in reaction time. Discussed with patient that he will be evaluated for return to driving in the clinic in 1 month.  Sutures may be removed today; discussed with nursing.   ROS: Denies CP, SOB, no limb pain Objective:   No results found. No results for input(s): WBC, HGB, HCT, PLT in the last 72 hours. No results for input(s): NA, K, CL, CO2, GLUCOSE, BUN, CREATININE, CALCIUM in the last 72 hours.  Intake/Output Summary (Last 24 hours) at 05/18/2020 1259 Last data filed at 05/18/2020 1254 Gross per 24 hour  Intake 360 ml  Output 750 ml  Net -390 ml    Physical Exam: Vital Signs Blood pressure 132/73, pulse 66, temperature 98.1 F (36.7 C), temperature source Oral, resp. rate 16, height 5\' 7"  (1.702 m), weight 74.3 kg, SpO2 99 %. Gen: no distress, normal appearing HEENT: oral mucosa pink and moist, NCAT Cardio: Reg rate Chest: normal effort, normal rate of breathing Abd: soft, non-distended Ext: no edema Skin: Staples intact, colostomy bag and appliance are intact, full of loose loose brown stool No tenderness or erythema around appliance  Neurological:   Motor: 4+/5 UE and LE--> 3+/5 prox to 4/5 distally. Sensory exam intact. Alert and oriented x 3. Normal insight and awareness. Intact Memory. Normal language and speech. Cranial nerve exam unremarkable    Assessment/Plan: 1. Functional deficits which require 3+ hours per day of interdisciplinary therapy in a comprehensive inpatient rehab setting.  Physiatrist is providing close team supervision and 24 hour management of active medical problems listed below.  Physiatrist and rehab team continue to assess barriers to discharge/monitor patient progress toward functional and medical goals  Care Tool:  Bathing    Body parts  bathed by patient: Right arm,Left arm,Chest,Abdomen,Front perineal area,Buttocks,Right upper leg,Left upper leg,Face   Body parts bathed by helper: Left lower leg,Right lower leg     Bathing assist Assist Level: Minimal Assistance - Patient > 75%     Upper Body Dressing/Undressing Upper body dressing   What is the patient wearing?: Pull over shirt    Upper body assist Assist Level: Set up assist    Lower Body Dressing/Undressing Lower body dressing      What is the patient wearing?: Pants     Lower body assist Assist for lower body dressing: Minimal Assistance - Patient > 75%     Toileting Toileting    Toileting assist Assist for toileting: Independent Assistive Device Comment: urinal   Transfers Chair/bed transfer  Transfers assist     Chair/bed transfer assist level: Supervision/Verbal cueing     Locomotion Ambulation   Ambulation assist      Assist level: Supervision/Verbal cueing Assistive device: Walker-rolling Max distance: 180'   Walk 10 feet activity   Assist     Assist level: Supervision/Verbal cueing Assistive device: Walker-rolling   Walk 50 feet activity   Assist    Assist level: Supervision/Verbal cueing Assistive device: Walker-rolling    Walk 150 feet activity   Assist Walk 150 feet activity did not occur: Safety/medical concerns (RLE trembling/fatigued)  Assist level: Supervision/Verbal cueing Assistive device: Walker-rolling    Walk 10 feet on uneven surface  activity   Assist Walk 10 feet on uneven surfaces activity did not occur: Safety/medical concerns (RLE trembling/fatigue)   Assist level: Supervision/Verbal cueing Assistive device: Development worker, international aidWalker-rolling   Wheelchair  Assist Will patient use wheelchair at discharge?: No      Wheelchair assist level: Supervision/Verbal cueing Max wheelchair distance: 50    Wheelchair 50 feet with 2 turns activity    Assist    Wheelchair 50 feet with 2 turns  activity did not occur:  (fatigue)       Wheelchair 150 feet activity     Assist  Wheelchair 150 feet activity did not occur:  (fatigue)       Blood pressure 132/73, pulse 66, temperature 98.1 F (36.7 C), temperature source Oral, resp. rate 16, height 5\' 7"  (1.702 m), weight 74.3 kg, SpO2 99 %.  Medical Problem List and Plan: 1.  Debility secondary to perforated diverticulitis complicated by septic shock.  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  Wound care nurse follow-up for colostomy care and education             -patient may not shower             -ELOS/Goals: 05/19/20  mod I  -DC tomorrow. Does not require transitional care appointment. Can follow-up with Dr. 05/21/20 on February 3rd, 2021  2.  Antithrombotics: -DVT/anticoagulation: Lovenox             -antiplatelet therapy: N/A 3. Pain Management:   12/27- pain controlled- d/c oxycodone  4. Mood: Provide emotional support             -antipsychotic agents: N/A 5. Neuropsych: This patient is capable of making decisions on his own behalf. 6. Skin/Wound Care: Routine skin checks  abd incision healing well, removed sutures 12/27  -no podiatry here at hospital 7. Fluids/Electrolytes/Nutrition: encourage PO             CMP reviewed 12/20 and other than albumin/protein looks ok  -protein supps 8. Acute blood anemia             Hemoglobin 9.4 on 12/16---9.9 12/20 9.  Hypertension.  Lisinopril 40 mg daily, Norvasc 5 mg daily.    12/21- BP controlled- con't regimen Vitals:   05/17/20 2119 05/18/20 0421  BP: 106/63 132/73  Pulse: 69 66  Resp: 16 16  Temp: 98.5 F (36.9 C) 98.1 F (36.7 C)  SpO2: 98% 99%  controlled- continue current regimen.  10.  Myasthenia gravis.  Prednisone 20 mg twice daily and Mestinon 30 mg every 8 hours.  Follow neurology services for management  -wbc's up d/t steroids 11.  BPH.  Ditropan 5 mg daily, Proscar 5 mg daily             Monitor for  retention 12.  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  SSI            CBGs ranging from 79-147: continue to monitor  13. Colostomy-  Healing well and functioning well No abd pain  Distention consistent with gas, avoid beans and other foods that may produce gas , will write for prn mylicon  Continue patient emptying and changing pouch on his own. 14. DC home with daughter tomorrow at 10am.    LOS: 10 days A FACE TO FACE EVALUATION WAS PERFORMED  05/20/20 Logyn Dedominicis 05/18/2020, 12:59 PM

## 2020-05-18 NOTE — Progress Notes (Signed)
Patient ID: Danny Walker, male   DOB: [birth year], 74 y.o.   MRN: [id number]  SW waiting on follow-up from Amy/[facility] to get updates on referral. No follow-up.   SW spoke with [facility] to discuss referral. Reports further research shows pt was d/c on 12/25 from [facility]. SW faxed referral to branch.   Cecile Sheerer, MSW, LCSWA Office: [phone] Cell: [phone] Fax: [phone]

## 2020-05-18 NOTE — Progress Notes (Signed)
Inpatient Rehabilitation Care Coordinator Discharge Note  The overall goal for the admission was met for:   Discharge location: Yes. D/c to home with intermittent support.   Length of Stay: Yes. 11 days.  Discharge activity level: Yes. Mod I.   Home/community participation: Yes. Limited.   Services provided included: MD, RD, PT, OT, RN, CM, TR, Pharmacy, Neuropsych and SW  Financial Services: Medicare and Medicaid  Choices offered to/list presented to:pt and pt step dtr Vicente Males  Follow-up services arranged: Home Health: [facility] for HHPT/OT/SN/aide/SW, DME: [place] for 3in1 [facility], has RW already and Patient/Family request agency HH: [place], DME: N.A  Comments (or additional information): contact pt [phone]; spanish interpreter required.  Patient/Family verbalized understanding of follow-up arrangements: Yes  Individual responsible for coordination of the follow-up plan: Pt to have assistance with coordinating care needs.   Confirmed correct DME delivered: Rana Snare 05/18/2020    Rana Snare

## 2020-05-18 NOTE — Progress Notes (Signed)
2200 Unable to ambulate with patient in hallway, communicated with assistance of video translator that I would be happy to return to patients room to assist with ambulation in approximately 20 minutes. Patient refused and requested assist to bed.

## 2020-05-18 NOTE — Plan of Care (Signed)
  Problem: RH Dressing Goal: LTG Patient will perform lower body dressing w/assist (OT) Description: LTG: Patient will perform lower body dressing with assist, with/without cues in positioning using equipment (OT) Outcome: Not Met (add Reason) Note: Pt still needing assistance to don compression hose, plans to buy an adaptive device and also stated his daughter can assist as needed   Problem: RH Toileting Goal: LTG Patient will perform toileting task (3/3 steps) with assistance level (OT) Description: LTG: Patient will perform toileting task (3/3 steps) with assistance level (OT)  Outcome: Not Met (add Reason) Note: Pt still requires setup for ostomy care and therefore goal was unable to be met, pt states daughter can provide setup assist upon d/c home   Problem: RH Laundry Goal: LTG Patient will perform laundry w/assist, cues (OT) Description: LTG: Patient will perform laundry with assistance, with/without cues (OT). Outcome: Not Met (add Reason) Note: Pt reports his daughter can assist with laundry at home as needed, did not practice at [facility] during his stay   Problem: RH Tub/Shower Transfers Goal: LTG Patient will perform tub/shower transfers w/assist (OT) Description: LTG: Patient will perform tub/shower transfers with assist, with/without cues using equipment (OT) Outcome: Not Met (add Reason) Note: Pt is not cleared medically to shower and therefore goal is not applicable/not met   Problem: RH Balance Goal: LTG Patient will maintain dynamic standing with ADLs (OT) Description: LTG:  Patient will maintain dynamic standing balance with assist during activities of daily living (OT)  Outcome: Completed/Met   Problem: Sit to Stand Goal: LTG:  Patient will perform sit to stand in prep for activites of daily living with assistance level (OT) Description: LTG:  Patient will perform sit to stand in prep for activites of daily living with assistance level (OT) Outcome: Completed/Met    Problem: RH Grooming Goal: LTG Patient will perform grooming w/assist,cues/equip (OT) Description: LTG: Patient will perform grooming with assist, with/without cues using equipment (OT) Outcome: Completed/Met   Problem: RH Bathing Goal: LTG Patient will bathe all body parts with assist levels (OT) Description: LTG: Patient will bathe all body parts with assist levels (OT) Outcome: Completed/Met   Problem: RH Dressing Goal: LTG Patient will perform upper body dressing (OT) Description: LTG Patient will perform upper body dressing with assist, with/without cues (OT). Outcome: Completed/Met   Problem: RH Simple Meal Prep Goal: LTG Patient will perform simple meal prep w/assist (OT) Description: LTG: Patient will perform simple meal prep with assistance, with/without cues (OT). Outcome: Completed/Met   Problem: RH Toilet Transfers Goal: LTG Patient will perform toilet transfers w/assist (OT) Description: LTG: Patient will perform toilet transfers with assist, with/without cues using equipment (OT) Outcome: Completed/Met

## 2020-05-18 NOTE — Discharge Instructions (Signed)
Inpatient Rehab Discharge Instructions  Danny Walker Discharge date and time: No discharge date for patient encounter.   Activities/Precautions/ Functional Status: Activity: activity as tolerated Diet: Soft Wound Care: Routine skin checks with colostomy care Functional status:  ___ No restrictions     ___ Walk up steps independently ___ 24/7 supervision/assistance   ___ Walk up steps with assistance ___ Intermittent supervision/assistance  ___ Bathe/dress independently ___ Walk with walker     _x__ Bathe/dress with assistance ___ Walk Independently    ___ Shower independently ___ Walk with assistance    ___ Shower with assistance ___ No alcohol     ___ Return to work/school ________   COMMUNITY REFERRALS UPON DISCHARGE:    Home Health:   PT    OT   RN    SNA   SW                  Agency: [facility]  Phone: [phone] *Please expect follow-up within 2-3 days to schedule your home visit. If you have not received follow-up, be sure to contact the branch directly.*   Medical Equipment/Items Ordered: 3in1 Bedside commode, Rolling walker (has already)                                                 Agency/Supplier: [facility] [phone]   Special Instructions: No driving smoking or alcohol  Routine colostomy care   My questions have been answered and I understand these instructions. I will adhere to these goals and the provided educational materials after my discharge from the hospital.  Patient/Caregiver Signature _______________________________ Date __________  Clinician Signature _______________________________________ Date __________  Please bring this form and your medication list with you to all your follow-up doctor's appointments.

## 2020-05-18 NOTE — Progress Notes (Signed)
Physical Therapy Session Note  Patient Details  Name: Danny Walker MRN: [id number] Date of Birth: 20-Feb-1946  Today's Date: 05/18/2020 PT Individual Time: 1102-1159 PT Individual Time Calculation (min): 57 min   Short Term Goals: Week 1:  PT Short Term Goal 1 (Week 1): = LTGs due to LOS  Skilled Therapeutic Interventions/Progress Updates:     Pt received seated in recliner. Spanish Interpreter present for session. No complaint of pain. PT cues pt to empty ostomy bag prior to session and pt completes with set-up assistance. Standstep transfer to [facility] with RW and supervision with cues for positioning. WC transport to gym for time management. Stand pivot to mat table without AD and with verbal cues on hand placement and positioning. Pt completes BERG balance assessment as detailed below. PT provides extra education and practice with shifting weight anteriorly relative to COG due to pt tendency to stand with forward flexed posture with weight in heels of feet. Pt practices heel raises with close supervision. 2x10. Stand pivot back to WC and and step to recliner with RW. Left seated in recliner with alarm intact and all needs within reach.  Therapy Documentation Precautions:  Precautions Precautions: Fall Precaution Comments: abdominal incision and colostomy bag Restrictions Weight Bearing Restrictions: No Balance: Standardized Balance Assessment Standardized Balance Assessment: Berg Balance Test Berg Balance Test Sit to Stand: Able to stand  independently using hands Standing Unsupported: Able to stand 2 minutes with supervision Sitting with Back Unsupported but Feet Supported on Floor or Stool: Able to sit safely and securely 2 minutes Stand to Sit: Controls descent by using hands Transfers: Able to transfer safely, definite need of hands Standing Unsupported with Eyes Closed: Able to stand 10 seconds with supervision Standing Ubsupported with Feet Together: Able to place feet  together independently and stand for 1 minute with supervision From Standing, Reach Forward with Outstretched Arm: Can reach forward >5 cm safely (2") From Standing Position, Pick up Object from Floor: Able to pick up shoe, needs supervision From Standing Position, Turn to Look Behind Over each Shoulder: Turn sideways only but maintains balance Turn 360 Degrees: Able to turn 360 degrees safely but slowly Standing Unsupported, Alternately Place Feet on Step/Stool: Able to complete 4 steps without aid or supervision Standing Unsupported, One Foot in Front: Able to plae foot ahead of the other independently and hold 30 seconds Standing on One Leg: Tries to lift leg/unable to hold 3 seconds but remains standing independently Total Score: 37    Therapy/Group: Individual Therapy  Beau Fanny, PT, DPT 05/18/2020, 12:44 PM

## 2020-05-18 NOTE — Progress Notes (Signed)
Physical Therapy Discharge Summary  Patient Details  Name: Danny Walker MRN: [id number] Date of Birth: [birth year]  Today's Date: 05/18/2020 PT Individual Time: 0903-1000 PT Individual Time Calculation (min): 57 min    Patient has met 11 of 12 long term goals due to improved activity tolerance, improved balance, improved postural control, increased strength, decreased pain and ability to compensate for deficits.  Patient to discharge at an ambulatory level Modified Independent household, supervision community.   Patient's care partner is independent to provide the necessary physical assistance at discharge.  Reasons goals not met: Patient is a functional ambulator and will not require a w/c at d/c. W/c goals no longer appropriate for this patient. Assessed activity tolerance with 6 Minute Walk Test, see below.  Recommendation:  Patient will benefit from ongoing skilled PT services in home health setting to continue to advance safe functional mobility, address ongoing impairments in balance, activity tolerance, lower extremity strength, functional mobility, gait training, and minimize fall risk.  Equipment: No equipment provided, patient has a RW at home  Reasons for discharge: treatment goals met  Patient/family agrees with progress made and goals achieved: Yes  Skilled Therapeutic Intervention: Patient in recliner in the room upon PT arrival. Patient alert and agreeable to PT session. Patient denied pain during session.  In-person interpreter present throughout session. Patient focused on d/c schedule and ordering meals for tomorrow. Provided answers and passed on to nutritional staff during session.   Therapeutic Activity: Bed Mobility: Patient performed rolling R/L and supine to/from sit with mod I for increased time in the ADL bed. Transfers: Patient performed sit to/from stand with mod I using RW without cues from various surfaces (recliner, ADL bed, ADL couch, mat table, arm  chair) throughout session. Patient performed a simulated sedan height car transfer with supervision and increased time due to low seat height using RW. Provided cues for safe technique.   Gait Training:  Patient ambulated >250 feet and >100 feet x2 using RW with mod I and supervision with increased fatigue. Ambulated as described below. Provided verbal cues for proximity to RW and increased step height for safety with fatigue. Patient ascended/descended 12 steps using B rails with CGA-supervision. Performed step-to gait pattern leading with L while ascending and R while descending. Provided cues for technique and sequencing.  Patient ambulated up/down a ramp, over 10 feet of mulch (unlevel surface), and up/down a curb to simulate community ambulation over unlevel surfaces with close supervision using RW. Provided cues for technique and use of AD. 6 Min Walk Test:  Instructed patient to ambulate as quickling and as safely as possible for 6 minutes using LRAD. Patient was allowed to take standing rest breaks without stopping the test, but if he required a sitting rest break the clock would be stopped and the test would be over.  Results: 275 meters (902 ft) using a RW with supervision-mod I  Educated patient on fall risk/prevention, home modifications to prevent falls, and activation of emergency services in the event of a fall during session.   Patient in recliner in the room at end of session with breaks locked, chair alarm set, and all needs within reach.    PT Discharge Precautions/Restrictions Precautions Precautions: Fall Precaution Comments: abdominal incision and colostomy bag Restrictions Weight Bearing Restrictions: No Vision/Perception  Perception Perception: Within Functional Limits Praxis Praxis: Intact  Cognition Overall Cognitive Status: Within Functional Limits for tasks assessed Arousal/Alertness: Awake/alert Sustained Attention: Appears intact Selective Attention:  Appears intact Memory: Impaired Memory Impairment:  Decreased recall of new information (patient using compensatory strategies PTA (pill box, calendar, journal)) Awareness: Appears intact Problem Solving: Appears intact Safety/Judgment: Appears intact Sensation Sensation Light Touch: Appears Intact Proprioception: Appears Intact Additional Comments: pt reports intermittent tingling RLE, from knee distally Coordination Gross Motor Movements are Fluid and Coordinated: No Coordination and Movement Description: pain in abdomen limits fluid movements, decreased balance with lower extremity weakness Motor  Motor Motor - Skilled Clinical Observations: generalized weakness; limited musclular endurance RLE Motor - Discharge Observations: generalized weakness; limited musclular endurance RLE (improved since admission)  Mobility Bed Mobility Bed Mobility: Rolling Right;Rolling Left;Supine to Sit;Sit to Supine Rolling Right: Independent Rolling Left: Independent Supine to Sit: Independent Sit to Supine: Independent Transfers Sit to Stand: Independent with assistive device Stand to Sit: Independent with assistive device Stand Pivot Transfers: Independent with assistive device Transfer (Assistive device): Rolling walker Locomotion  Gait Ambulation: Yes Gait Assistance: Independent with assistive device;Supervision/Verbal cueing Assistive device: Rolling walker Gait Gait: Yes Gait Pattern: Decreased stride length;Decreased hip/knee flexion - right;Decreased hip/knee flexion - left;Decreased trunk rotation;Narrow base of support;Trunk flexed Gait velocity: decreased Stairs / Additional Locomotion Stairs: Yes Stairs Assistance: Contact Guard/Touching assist;Supervision/Verbal cueing Stair Management Technique: Two rails Height of Stairs: 6 Ramp: Supervision/Verbal cueing (with RW) Curb: Supervision/Verbal cueing (with RW) Wheelchair Mobility 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)  Trunk/Postural Assessment  Cervical Assessment Cervical Assessment: Exceptions to [facility] (forward head) Thoracic Assessment Thoracic Assessment: Exceptions to [facility] (kyphotic, R posterior rib hump) Lumbar Assessment Lumbar Assessment: Exceptions to [facility] (asymmetrical; L trunk shortened; scoliotic spine) Postural Control Postural Control: Deficits on evaluation (protective responses inadequate and unsafe; hx falls PTA)  Balance Balance Balance Assessed: Yes Standardized Balance Assessment Standardized Balance Assessment: Berg Balance Test Berg Balance Test Sit to Stand: Able to stand  independently using hands Standing Unsupported: Able to stand 2 minutes with supervision Sitting with Back Unsupported but Feet Supported on Floor or Stool: Able to sit safely and securely 2 minutes Stand to Sit: Controls descent by using hands Transfers: Able to transfer safely, definite need of hands Standing Unsupported with Eyes Closed: Able to stand 10 seconds with supervision Standing Ubsupported with Feet Together: Able to place feet together independently and stand for 1 minute with supervision From Standing, Reach Forward with Outstretched Arm: Can reach forward >5 cm safely (2") From Standing Position, Pick up Object from Floor: Able to pick up shoe, needs supervision From Standing Position, Turn to Look Behind Over each Shoulder: Turn sideways only but maintains balance Turn 360 Degrees: Able to turn 360 degrees safely but slowly Standing Unsupported, Alternately Place Feet on Step/Stool: Able to complete 4 steps without aid or supervision Standing Unsupported, One Foot in Front: Able to plae foot ahead of the other independently and hold 30 seconds Standing on One Leg: Tries to lift leg/unable to hold 3 seconds but remains standing independently Total Score: 37 Static Sitting Balance Static Sitting - Level of Assistance: 7: Independent Dynamic Sitting Balance Dynamic Sitting - Level of  Assistance: 7: Independent Static Standing Balance Static Standing - Level of Assistance: 6: Modified independent (Device/Increase time) Dynamic Standing Balance Dynamic Standing - Balance Support: No upper extremity supported;During functional activity Dynamic Standing - Level of Assistance: 6: Modified independent (Device/Increase time) Extremity Assessment  RLE Assessment RLE Assessment: Exceptions to WFL (moderate pitting edema foot and ankle) Passive Range of Motion (PROM) Comments: tight hamstrings Active Range of Motion (AROM) Comments: WFL for all functional mobility General Strength Comments: grossly in  sitting: 4/5 hip flexion, 4+/5ankle DF, otherwise 5/5 throughout LLE Assessment LLE Assessment: Exceptions to WFL (moderate pitting edema foot and ankle) Passive Range of Motion (PROM) Comments: tight hamstrings Active Range of Motion (AROM) Comments: WFL for all functional mobility General Strength Comments: grossly in sitting: 4+/5 hip flexion and ankle DF, otherwise 5/5 throughout    Jazmine Longshore L Eri Mcevers PT, DPT  05/18/2020, 6:49 PM

## 2020-05-18 NOTE — Consult Note (Signed)
[place] Nurse ostomy follow up Patient receiving care in [facility] [phone] Visit completed with iPad interpretor Stoma type/location: LUQ colostomy Output: Soft brown stool Ostomy pouching: 1pc flat Kellie Simmering # [phone]) with barrier ring Kellie Simmering # [phone])  Education provided: Patient lunch had just been delivered, therefore he did not want to do any teaching at this time. However, he states that he has been emptying his pouch on his own for the last three days and has changed his pouch by himself. The patient questioned how to get supplies at home and I reminded him that my colleague, S. Tora Perches had given him a book with a number and the information he needs to get more supplies. He then remembered his step-daughter had taken that book home. No supplies found in the room. Secretary is ordering six more pouches and barrier rings to be taken to his room. Stressed to him that he should take any supplies in the room at discharge home with him. States he should be discharged tomorrow on Tuesday 05/19/20. WOC will sign off at this time. Enrolled patient in [place] Discharge program: Yes, previously  Thank you for the consult. [place] nurse will not follow at this time.   Please re-consult the [place] team if needed.  Cathlean Marseilles Tamala Julian, MSN, RN, [facility], Lysle Pearl, [facility] Wound Treatment Associate Pager [phone]

## 2020-05-18 NOTE — Discharge Summary (Signed)
Occupational Therapy Discharge Summary  Patient Details  Name: Danny Walker MRN: [id number] Date of Birth: December 08, 1945  Today's Date: 05/18/2020 OT Individual Time: 1300-1400 OT Individual Time Calculation (min): 60 min   Patient has met 7 of 11 long term goals due to improved activity tolerance, improved balance, postural control, ability to compensate for deficits and improved coordination.  Patient to discharge at overall Modified Independent-Min A level. Patient exhibits the ability to direct his own care and reports that his daughter can provide the needed assistance at discharge.   4 goals were unable to be met due to pt still requiring some assistance with LB dressing, toileting, and IADL tasks at time of discharge.  Recommendation:  Patient will benefit from ongoing skilled OT services in home health setting to continue to advance functional skills in the area of BADL.  Equipment: 3:1  Reasons for discharge: treatment goals met and discharge from hospital  Patient/family agrees with progress made and goals achieved: Yes   Skilled Therapeutic Intervention:  Pt greeted in the recliner with pain in LEs due to swelling but manageable for tx. Tried to teach him an adaptive way to don his compression stockings with pt reporting it was too difficult before attempting. OT printed him off a sheet of an adaptive compression sock donner so he could buy and use it at home. Afterwards pt emptied ostomy bag with setup assistance, stated he already completed bathing today. Agreeable to simulate bathing tasks sit<stand at sink. Pt ambulated with device and gathered needed items from bathroom and then engaged in simulated bathing tasks using LH sponge to reach his feet. Reacher used to doff/don pants after. Per pt, he used the reacher and LH sponge at home PTA. Mod I for donning shirt. He stated he already completed oral care independently. Simulated toilet transfer completed with Mod I and then  after a seated rest he returned to the recliner. Education provided regarding edema mgt strategies at home including elevation and LE movement/ankle pumps. Pt remained in the recliner at close of session, all needs within reach and chair alarm set.   Interpretor present during session  OT Discharge Precautions/Restrictions  Precautions Precautions: Fall Precaution Comments: abdominal incision and colostomy bag Vital Signs Therapy Vitals Temp: 98.4 F (36.9 C) Temp Source: Oral Pulse Rate: (!) 107 Resp: 17 BP: 106/69 Patient Position (if appropriate): Sitting Oxygen Therapy SpO2: 95 % O2 Device: Room Air ADL ADL Eating: Independent (per most recent staff documentation) Where Assessed-Eating: Bed level Grooming: Setup,Independent (per pt report) Where Assessed-Grooming: Edge of bed Upper Body Bathing: Modified independent Where Assessed-Upper Body Bathing: Sitting at sink Lower Body Bathing: Modified independent Where Assessed-Lower Body Bathing: Standing at sink,Sitting at sink Upper Body Dressing: Modified independent (Device) Where Assessed-Upper Body Dressing: Sitting at sink Lower Body Dressing: Minimal assistance Where Assessed-Lower Body Dressing: Sitting at sink,Standing at sink Toileting: Setup Where Assessed-Toileting: Other (Comment) (chair, ostomy care) Toilet Transfer: Modified independent Toilet Transfer Method: Arts development officer: Raised toilet seat Tub/Shower Transfer: Not assessed Social research officer, government: Not assessed (pt not cleared to shower at time of d/c) Cognition Overall Cognitive Status: Within Functional Limits for tasks assessed Arousal/Alertness: Awake/alert Orientation Level: Oriented X4 Safety/Judgment: Appears intact Balance Balance Balance Assessed: Yes Dynamic Standing Balance Dynamic Standing - Balance Support: No upper extremity supported;During functional activity Dynamic Standing - Level of Assistance: 6:  Modified independent (Device/Increase time) (LB dressing tasks) Extremity/Trunk Assessment RUE Assessment RUE Assessment: Within Functional Limits LUE Assessment LUE Assessment: Within  Functional Limits   Jenita Rayfield A Jeydan Barner 05/18/2020, 4:00 PM

## 2020-05-19 LAB — GLUCOSE, CAPILLARY
Glucose-Capillary: 106 mg/dL — ABNORMAL HIGH (ref 70–99)
Glucose-Capillary: 135 mg/dL — ABNORMAL HIGH (ref 70–99)
Glucose-Capillary: 243 mg/dL — ABNORMAL HIGH (ref 70–99)

## 2020-05-19 NOTE — Progress Notes (Signed)
[facility] PHYSICAL MEDICINE & REHABILITATION PROGRESS NOTE   Subjective/Complaints: Complains of right lower extremity weakness and swelling, especially when wearing shoes.  No other complaints.    ROS: Denies CP, SOB, no limb pain, + intermittent swelling in RLE Objective:   No results found. No results for input(s): WBC, HGB, HCT, PLT in the last 72 hours. No results for input(s): NA, K, CL, CO2, GLUCOSE, BUN, CREATININE, CALCIUM in the last 72 hours.  Intake/Output Summary (Last 24 hours) at 05/19/2020 1000 Last data filed at 05/19/2020 0900 Gross per 24 hour  Intake 560 ml  Output 1325 ml  Net -765 ml    Physical Exam: Vital Signs Blood pressure 130/80, pulse 65, temperature 98.2 F (36.8 C), resp. rate 16, height 5\' 7"  (1.702 m), weight 74.3 kg, SpO2 99 %. Gen: no distress, normal appearing HEENT: oral mucosa pink and moist, NCAT Cardio: Reg rate Chest: normal effort, normal rate of breathing Abd: soft, non-distended Ext: no edema Skin: intact Neurological:   Motor: 4+/5 UE and LE--> 3+/5 prox to 4/5 distally. Sensory exam intact. Alert and oriented x 3. Normal insight and awareness. Intact Memory. Normal language and speech. Cranial nerve exam unremarkable    Assessment/Plan: 1. Functional deficits which require 3+ hours per day of interdisciplinary therapy in a comprehensive inpatient rehab setting.  Physiatrist is providing close team supervision and 24 hour management of active medical problems listed below.  Physiatrist and rehab team continue to assess barriers to discharge/monitor patient progress toward functional and medical goals  Care Tool:  Bathing    Body parts bathed by patient: Right arm,Left arm,Chest,Abdomen,Front perineal area,Buttocks,Right upper leg,Left upper leg,Face,Left lower leg,Right lower leg   Body parts bathed by helper: Left lower leg,Right lower leg     Bathing assist Assist Level: Independent with assistive device     Upper  Body Dressing/Undressing Upper body dressing   What is the patient wearing?: Pull over shirt    Upper body assist Assist Level: Independent with assistive device    Lower Body Dressing/Undressing Lower body dressing      What is the patient wearing?: Pants     Lower body assist Assist for lower body dressing: Independent with assitive device     Toileting Toileting    Toileting assist Assist for toileting: Set up assist Assistive Device Comment: urinal   Transfers Chair/bed transfer  Transfers assist     Chair/bed transfer assist level: Independent with assistive device Chair/bed transfer assistive device: Programmer, multimedia   Ambulation assist      Assist level: Supervision/Verbal cueing Assistive device: Walker-rolling Max distance: 902 ft   Walk 10 feet activity   Assist     Assist level: Independent with assistive device Assistive device: Walker-rolling   Walk 50 feet activity   Assist    Assist level: Independent with assistive device Assistive device: Walker-rolling    Walk 150 feet activity   Assist Walk 150 feet activity did not occur: Safety/medical concerns (RLE trembling/fatigued)  Assist level: Supervision/Verbal cueing Assistive device: Walker-rolling    Walk 10 feet on uneven surface  activity   Assist Walk 10 feet on uneven surfaces activity did not occur: Safety/medical concerns (RLE trembling/fatigue)   Assist level: Supervision/Verbal cueing Assistive device: Aeronautical engineer Will patient use wheelchair at discharge?: No   Wheelchair activity did not occur: N/A  Wheelchair assist level: Supervision/Verbal cueing Max wheelchair distance: 50    Wheelchair 50 feet with  2 turns activity    Assist    Wheelchair 50 feet with 2 turns activity did not occur: N/A       Wheelchair 150 feet activity     Assist  Wheelchair 150 feet activity did not occur: N/A        Blood pressure 130/80, pulse 65, temperature 98.2 F (36.8 C), resp. rate 16, height 5\' 7"  (1.702 m), weight 74.3 kg, SpO2 99 %.  Medical Problem List and Plan: 1.  Debility secondary to perforated diverticulitis complicated by septic shock.  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  Wound care nurse follow-up for colostomy care and education             -patient may not shower             -ELOS/Goals: 05/19/20  mod I  -DC today. Does not require transitional care appointment. Can follow-up with Dr. 05/21/20 on February 3rd, 2021  2.  Antithrombotics: -DVT/anticoagulation: Lovenox             -antiplatelet therapy: N/A 3. Pain Management:   12/27- pain controlled- d/c oxycodone  4. Mood: Provide emotional support             -antipsychotic agents: N/A 5. Neuropsych: This patient is capable of making decisions on his own behalf. 6. Skin/Wound Care: Routine skin checks  abd incision healing well, removed sutures 12/27  -no podiatry here at hospital 7. Fluids/Electrolytes/Nutrition: encourage PO             CMP reviewed 12/20 and other than albumin/protein looks ok  -protein supps 8. Acute blood anemia             Hemoglobin 9.4 on 12/16---9.9 12/20 9.  Hypertension.  Lisinopril 40 mg daily, Norvasc 5 mg daily.    12/28: BP well controlled.  Vitals:   05/18/20 2011 05/19/20 0445  BP: 99/68 130/80  Pulse: 76 65  Resp: 18 16  Temp: 98.6 F (37 C) 98.2 F (36.8 C)  SpO2: 97% 99%  controlled- continue current regimen.  10.  Myasthenia gravis.  Prednisone 20 mg twice daily and Mestinon 30 mg every 8 hours.  Follow neurology services for management  -wbc's up d/t steroids 11.  BPH.  Continue Ditropan 5 mg daily, Proscar 5 mg daily             Monitor for retention 12.  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  SSI            CBGs ranging from 79-147: continue to monitor  13. Colostomy-  Healing well and functioning well No abd pain   Distention consistent with gas, avoid beans and other foods that may produce gas , will write for prn mylicon  Continue patient emptying and changing pouch on his own. 14. DC home with daughter today.    >30 minutes spent in discharge of patient including review of medications and follow-up appointments, physical examination, and in answering all patient's questions   LOS: 11 days A FACE TO FACE EVALUATION WAS PERFORMED  Natosha Bou P Lisette Mancebo 05/19/2020, 10:00 AM

## 2020-05-21 ENCOUNTER — Telehealth: Payer: Self-pay | Admitting: *Deleted

## 2020-05-21 NOTE — Telephone Encounter (Signed)
Tresa Endo PT [facility] called for POC 1wk1 2wk3 1wk4 and SN for education.  Approval given.

## 2020-05-27 ENCOUNTER — Telehealth: Payer: Self-pay

## 2020-05-27 NOTE — Telephone Encounter (Signed)
Marcelino Duster, RN from [facility] called requesting [facility] 1wk9 for wound care and ostomy education. Orders approved and given per discharge summary.

## 2020-05-27 NOTE — Telephone Encounter (Signed)
Per [facility]: OT okay for twice a week for one week. Then once a week for 8 weeks. Discharge note reviewed.  [facility] Care/ Darl Pikes OT ph [phone]).

## 2020-06-15 ENCOUNTER — Other Ambulatory Visit: Payer: Self-pay | Admitting: Neurology

## 2020-06-16 ENCOUNTER — Telehealth: Payer: Self-pay

## 2020-06-16 ENCOUNTER — Telehealth: Payer: Self-pay | Admitting: *Deleted

## 2020-06-16 NOTE — Telephone Encounter (Signed)
Call from [place], [facility] - states on nursing visit today pt has 3+ pitting edema in ankles and feet; BP 142/80, HR-87; lungs are clear. States pt is not on any medication for the edema. Please advise.

## 2020-06-16 NOTE — Telephone Encounter (Addendum)
Danny Walker (phone [phone]) with [facility] called: Patient has pitting edema. She needed to know who his PCP name. According to Epic its listed as [name] MD. (Information given).  Dr. Ranell Patrick is not in the office today.

## 2020-06-16 NOTE — Telephone Encounter (Signed)
It looks like patient has chronic lower extremity edema. If he is not having shortness of breath or chest pain, then we can schedule him for a follow up appointment in the next couple of weeks.

## 2020-06-18 ENCOUNTER — Encounter: Payer: Self-pay | Admitting: Internal Medicine

## 2020-06-18 NOTE — Telephone Encounter (Signed)
error 

## 2020-06-18 NOTE — Telephone Encounter (Signed)
Unable to reach the patient.  Mailed patient a letter.

## 2020-06-30 ENCOUNTER — Encounter: Payer: Self-pay | Admitting: Student

## 2020-06-30 ENCOUNTER — Ambulatory Visit (INDEPENDENT_AMBULATORY_CARE_PROVIDER_SITE_OTHER): Payer: Medicare Other | Admitting: Student

## 2020-06-30 VITALS — BP 133/88 | HR 86 | Temp 97.8°F | Ht 66.0 in | Wt 156.0 lb

## 2020-06-30 DIAGNOSIS — R233 Spontaneous ecchymoses: Secondary | ICD-10-CM

## 2020-06-30 DIAGNOSIS — R238 Other skin changes: Secondary | ICD-10-CM

## 2020-06-30 DIAGNOSIS — F419 Anxiety disorder, unspecified: Secondary | ICD-10-CM | POA: Insufficient documentation

## 2020-06-30 DIAGNOSIS — R600 Localized edema: Secondary | ICD-10-CM | POA: Diagnosis not present

## 2020-06-30 DIAGNOSIS — D699 Hemorrhagic condition, unspecified: Secondary | ICD-10-CM | POA: Diagnosis not present

## 2020-06-30 DIAGNOSIS — S40021A Contusion of right upper arm, initial encounter: Secondary | ICD-10-CM

## 2020-06-30 DIAGNOSIS — R58 Hemorrhage, not elsewhere classified: Secondary | ICD-10-CM | POA: Diagnosis not present

## 2020-06-30 HISTORY — DX: Contusion of right upper arm, initial encounter: S40.021A

## 2020-06-30 HISTORY — DX: Anxiety disorder, unspecified: F41.9

## 2020-06-30 LAB — CBC WITH DIFFERENTIAL/PLATELET
Abs Immature Granulocytes: 0.2 10*3/uL — ABNORMAL HIGH (ref 0.00–0.07)
Basophils Absolute: 0 10*3/uL (ref 0.0–0.1)
Basophils Relative: 0 %
Eosinophils Absolute: 0 10*3/uL (ref 0.0–0.5)
Eosinophils Relative: 0 %
HCT: 38.4 % — ABNORMAL LOW (ref 39.0–52.0)
Hemoglobin: 11.9 g/dL — ABNORMAL LOW (ref 13.0–17.0)
Immature Granulocytes: 2 %
Lymphocytes Relative: 4 %
Lymphs Abs: 0.5 10*3/uL — ABNORMAL LOW (ref 0.7–4.0)
MCH: 30.2 pg (ref 26.0–34.0)
MCHC: 31 g/dL (ref 30.0–36.0)
MCV: 97.5 fL (ref 80.0–100.0)
Monocytes Absolute: 0.3 10*3/uL (ref 0.1–1.0)
Monocytes Relative: 3 %
Neutro Abs: 11.8 10*3/uL — ABNORMAL HIGH (ref 1.7–7.7)
Neutrophils Relative %: 91 %
Platelets: 196 10*3/uL (ref 150–400)
RBC: 3.94 MIL/uL — ABNORMAL LOW (ref 4.22–5.81)
RDW: 15.5 % (ref 11.5–15.5)
WBC: 12.8 10*3/uL — ABNORMAL HIGH (ref 4.0–10.5)
nRBC: 0 % (ref 0.0–0.2)

## 2020-06-30 LAB — APTT: aPTT: 24 seconds (ref 24–36)

## 2020-06-30 LAB — PROTIME-INR
INR: 1 (ref 0.8–1.2)
Prothrombin Time: 13 seconds (ref 11.4–15.2)

## 2020-06-30 MED ORDER — DICLOFENAC SODIUM 1 % EX GEL: 4.0000 g | Freq: Four times a day (QID) | CUTANEOUS | 5 refills | Status: DC

## 2020-06-30 MED ORDER — NAPROXEN 250 MG PO TABS: 250.0000 mg | ORAL_TABLET | Freq: Two times a day (BID) | ORAL | 0 refills | Status: DC

## 2020-06-30 NOTE — Assessment & Plan Note (Signed)
Patient reports bruising to the right arm which appeared 2 days ago.  It was initially a darker red color, but has since become more brown.  Denies any fall or trauma, but did note that he had blood pressure checked in that arm 2 days ago.  Is not having any unusual pain or tenderness.  Has not noticed bleeding or bruising anywhere else.  Denies any history of bleeding disorders.  On exam, there is a large nearly circumferential bruising on the upper arm over the biceps muscle.  Has diffuse purpuric changes related to aging and some telangiectasias, but no other signs of acute bleeding.  No oropharyngeal bleeding or petechia.  He denies noticing any blood in his stool, he notes recent surgery for perforated diverticulitis with colostomy bag but is not seeing any overt blood in the output.  He is scheduled for reversal of the colostomy in 2 months.  Last CBC in December with normal platelet count, previously thrombocytopenia but likely in setting of acute diverticular perforation and recovered since then.  Last PT/INR normal in December.  -Obtained CBC, PT, PTT, and INR which were reassuring.  Mild normocytic anemia which is improved from December labs.  Normal platelet count. given these normal findings and lack of other bleeding elsewhere, suspect this is due to the blood pressure reading without high suspicion for other underlying bleeding disorder -Patient given return precautions

## 2020-06-30 NOTE — Progress Notes (Signed)
   CC: bruising to R arm  HPI:  Mr.Boyd Furkan Keenum is a 75 y.o. male with history as below presenting for bruising to his right arm. Please refer to problem based charting for further details of assessment and plan of current problem and chronic medical conditions.  Past Medical History:  Diagnosis Date  . BPH (benign prostatic hyperplasia)   . DDD (degenerative disc disease), lumbosacral   . Fatty liver   . Foley catheter in place   . History of gallstones   . History of prostatitis   . Hypertension   . IDA (iron deficiency anemia)   . Liver mass 09/17/2001   4.7cm , noted on [place] abd  . Low back pain   . Myasthenia gravis ([place])   . Nocturia   . 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 Spondylosis Lumbar  . Umbilical hernia   . Urinary retention 02/08/2018  . Weakness of both legs 02/2020   Review of Systems:   Review of Systems  Constitutional: Negative for chills and fever.  Gastrointestinal: Negative for abdominal pain, blood in stool, melena, nausea and vomiting.  Musculoskeletal: Positive for joint pain (chronic). Negative for falls.  Neurological: Negative for dizziness and loss of consciousness.  Endo/Heme/Allergies: Bruises/bleeds easily.  All other systems reviewed and are negative.    Physical Exam: Vitals:   06/30/20 1056  BP: 133/88  Pulse: 86  Temp: 97.8 F (36.6 C)  TempSrc: Oral  SpO2: 96%  Weight: 156 lb (70.8 kg)  Height: 5\' 6"  (1.676 m)   Constitutional: no acute distress Head: atraumatic ENT: external ears normal Cardiovascular: regular rate, trace nonpitting pedal edema bilaterally without any overlying chronic venous stasis changes to the skin Pulmonary: effort normal Abdominal: flat, nontender, no rebound tenderness, bowel sounds normal Musculoskeletal: Large area of ecchymosis roughly 5 cm in length overlying the biceps area on the right upper arm which is nearly circumferential.  No tenderness over this area.   Biceps is noted to be prominent, but proximal and distal tendons are intact to palpation.  Some mild prepubic skin changes related to aging and telangiectasias noted, but otherwise no ecchymosis or bruises noted elsewhere on the skin.  Oropharynx is not bleeding and there are no petechiae. Skin: warm and dry Neurological: alert, no focal deficit Psychiatric: normal mood and affect  Assessment & Plan:   See Encounters Tab for problem based charting.  Patient seen with Dr. Evette Doffing

## 2020-06-30 NOTE — Assessment & Plan Note (Signed)
Upon speaking with his daughter-in-law to update on his lab results, she notes that he has been anxious about his health for the past 4 years since his wife died.  States he also may be depressed.  She notes that he does not get dressed and stays in his pajamas most of the day.  Also thinks that he has been a little more anxious since he stopped working.  He recently had a large surgery and has revision planned in 2 months, so there could definitely be a reactive component.  He did seem mildly anxious on exam concerning his symptoms, but not extremely so.  -Address at next office visit with PCP

## 2020-06-30 NOTE — Patient Instructions (Signed)
Gracias por permitirnos ser parte de su cuidado hoy, fue un placer verlo. Hablamos de los moretones y la hinchazn de las piernas.  Estoy revisando estos laboratorios: CBC, PT, INR, PTT  Llamar a su hija con los resultados de sus anlisis de [place].  Para la hinchazn de los pies, recomiendo medias de compresin o envolturas de compresin (como las vendas ACE) para ayudar con la hinchazn.   Gracias y llame a la [place] al [phone] si tiene alguna pregunta.  Mejor, Dr. Bridgett Larsson  __________________________________________________________________  Thank you for allowing [place] to be a part of your care today, it was a pleasure seeing you. We discussed your bruising and leg swelling  I am checking these labs: CBC, PT, INR, PTT  I will call your daughter with the results of your blood tests.  For your feet swelling, I recommend compression stockings or compressive wraps (such as ACE bandages) to help with the swelling.   Thank you, and please call the [facility] Medicine Clinic at [phone] if you have any questions.  Best, Dr. Bridgett Larsson

## 2020-06-30 NOTE — Assessment & Plan Note (Signed)
Patient complains of swelling in the bilateral feet at the end of the day which improved with elevation and with rest, swelling is resolved by the morning.  Is not having any shortness of breath or orthopnea.  Not having any pain.  On exam there is trace nonpitting edema without chronic venous stasis changes.  -Discussed using compression stockings.  He has difficulty getting these off.  However, he has compressive bandages and wants to use these instead -He is on amlodipine 5 mg.  Can consider changing this if symptoms worsen.

## 2020-07-01 NOTE — Progress Notes (Signed)
[facility] Medicine Clinic Attending  I saw and evaluated the patient.  I personally confirmed the key portions of the history and exam documented by Dr. Chen and I reviewed pertinent patient test results.  The assessment, diagnosis, and plan were formulated together and I agree with the documentation in the resident's note.  

## 2020-07-07 NOTE — Telephone Encounter (Signed)
Pt was seen 06/30/20.

## 2020-07-13 ENCOUNTER — Telehealth: Payer: Self-pay | Admitting: Internal Medicine

## 2020-07-13 NOTE — Telephone Encounter (Signed)
Left verbal auth to extend [facility] PT 1 week 4 on Kelly's confidential VM. Requested return call for any questions. Will route to Red Team for agreement/denial. Hubbard Hartshorn, BSN, RN-BC

## 2020-07-13 NOTE — Telephone Encounter (Signed)
Rec'd Call  from [place] Georgina Peer) requesting to extend the patient's  Danny Walker PT for 1 time a week for 4 weeks.  Pls call back.

## 2020-07-13 NOTE — Telephone Encounter (Signed)
Agree with plan, thank you!

## 2020-07-14 ENCOUNTER — Ambulatory Visit: Payer: Self-pay | Admitting: Surgery

## 2020-07-14 NOTE — H&P (Signed)
History of Present Illness Danny Walker. Dorri Ozturk MD; 07/14/2020 5:48 PM) The patient is a 75 year old male who presents with diverticulitis. Follow-up from emergent surgery on DOW 05/01/20  This is a 75 year old male with myasthenia gravis on chronic prednisone who presented on 05/01/20 with sepsis, hypotension, and tachycardia. CT scan showed diverticulitis with free air tracking up the retroperitoneum to the mediastinum. Despite the high-risk of the procedure, we recommended immediate Hartman's procedure. He has a previous umbilical hernia repair with mesh. The mesh was removed at the exploratory laparotomy. He underwent sigmoid colectomy with descending colostomy. No gross fecal contamination.  He was discharged to rehab one week after surgery and was discharged home 05/19/20. His appetite has returned to normal. He continues to work with physical therapy at home, and is regaining his strength.. His ostomy is functioning well and his wound is healed. He denies any significant pain. He is returning to work this week. He is managing his colostomy well. He would like to wait at least another month before having surgery again to reverse his colostomy.   Problem List/Past Medical Rodman Key K. 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 Georgette Dover, MD; 07/14/2020 5:48 PM) Appendectomy Hernia Repair UHR w/ mesh  Diagnostic Studies History Rodman Key K. Georgette Dover, MD; 07/14/2020 5:48 PM) Colonoscopy never  Allergies Rodman Key K. Nataley Bahri, MD; 07/14/2020 5:48 PM) No Known Drug Allergies [12/21/2015]:  Medication History Danny Walker. Toan Mort, MD; 07/14/2020 5:48 PM) DiazePAM (5MG  Tablet, Oral) Active. Finasteride (5MG  Tablet, Oral) Active. Rapaflo (8MG  Capsule, Oral) Active. Cyclobenzaprine HCl (10MG  Tablet, Oral)  Active. Hydrocodone-Acetaminophen (5-325MG  Tablet, Oral) Active.  Social History Danny Walker. Jury Caserta, MD; 07/14/2020 5:48 PM) Alcohol use Occasional alcohol use. Caffeine use Coffee, Tea. No drug use Tobacco use Never smoker.  Family History Danny Walker. Georgette Dover, MD; 07/14/2020 5:48 PM) Family history unknown First Degree Relatives  Other Problems Danny Walker. Ishmel Acevedo, MD; 07/14/2020 5:48 PM) Back Pain Bladder Problems High blood pressure Umbilical Hernia Repair     Physical Exam Rodman Key K. Ashwath Lasch MD; 07/14/2020 5:49 PM)  The physical exam findings are as follows: Note:Constitutional: WDWN in NAD, conversant, no obvious deformities; resting comfortably Eyes: Pupils equal, round; sclera anicteric; moist conjunctiva; no lid lag HENT: Oral mucosa moist; good dentition Neck: No masses palpated, trachea midline; no thyromegaly Lungs: CTA bilaterally; normal respiratory effort CV: Regular rate and rhythm; no murmurs; extremities well-perfused with no edema Abd: +bowel sounds, soft, non-tender, well-healed midline incision, no palpable organomegaly; no palpable hernias, pink viable left lower quadrant colostomy with brown stool in the bag. Musc: Normal gait; no apparent clubbing or cyanosis in extremities Lymphatic: No palpable cervical or axillary lymphadenopathy Skin: Warm, dry; no sign of jaundice Psychiatric - alert and oriented x 4; calm mood and affect    Assessment & Plan Rodman Key K. 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 The surgical procedure has been discussed with the patient. Potential risks, benefits, alternative treatments, and expected outcomes have been explained. All of the patient's questions at this time have been answered. The likelihood of reaching the patient's treatment goal is good. The patient understand the proposed surgical procedure and  wishes to proceed.  One-day Miralax prep and Fleets enema to rectal stump  Danny Walker. Georgette Dover, MD, [facility]  Surgery  General/ Trauma Surgery   07/14/2020 5:50 PM

## 2020-07-28 ENCOUNTER — Telehealth: Payer: Self-pay | Admitting: *Deleted

## 2020-07-28 NOTE — Telephone Encounter (Signed)
Henderson Newcomer OT [facility] called for a POC for OT 1wk4.   I see that on discharge the MSW notes says [facility] services ordered, but there is no follow up here -only as needed-.  Will you be signing these orders?

## 2020-07-29 ENCOUNTER — Other Ambulatory Visit: Payer: Self-pay | Admitting: Student

## 2020-07-29 NOTE — Telephone Encounter (Signed)
He needs f/u here for me to sign them, or he can have a provider with whom he is following assess and sign them

## 2020-07-29 NOTE — Telephone Encounter (Signed)
I have left Manuela Schwartz a VM informing her that he does not have a f/u appt with [place] and needs to be sure to make one in order for Dr Ranell Patrick to sign the orders. If not it will have to go to the PCP. If he makes the appt and keeps it Dr Ranell Patrick will be the signing MD.

## 2020-07-31 ENCOUNTER — Telehealth: Payer: Self-pay | Admitting: *Deleted

## 2020-07-31 NOTE — Telephone Encounter (Signed)
Agree with order

## 2020-07-31 NOTE — Telephone Encounter (Signed)
Call fro Danny Walker, [facility] - requesting verbal order for "Home OT once a week x 4 weeks". States pt needs help with strengthening, endurance and activity tolerancefor " Home OT once a week x 4 weeks". VO given - if not appropriate, let me know.  Thanks

## 2020-08-03 ENCOUNTER — Other Ambulatory Visit: Payer: Self-pay | Admitting: Internal Medicine

## 2020-08-10 ENCOUNTER — Telehealth: Payer: Self-pay

## 2020-08-10 NOTE — Telephone Encounter (Signed)
[facility]

## 2020-08-10 NOTE — Telephone Encounter (Signed)
Agree with plan 

## 2020-08-10 NOTE — Telephone Encounter (Signed)
Returned call to [place], [place] with [facility]. Requesting VO to continue HH PT 1 week 1, and 2 week 1 to get him ready for upcoming surgery. Verbal auth given. Will route to Red Team for agreement/denial.

## 2020-08-14 ENCOUNTER — Emergency Department (HOSPITAL_COMMUNITY): Payer: Medicare Other

## 2020-08-14 ENCOUNTER — Emergency Department (HOSPITAL_COMMUNITY)
Admission: EM | Admit: 2020-08-14 | Discharge: 2020-08-14 | Disposition: A | Discharge status: Home / Self Care | Payer: Medicare Other | Attending: Emergency Medicine | Admitting: Emergency Medicine

## 2020-08-14 ENCOUNTER — Other Ambulatory Visit: Payer: Self-pay

## 2020-08-14 ENCOUNTER — Encounter (HOSPITAL_COMMUNITY): Payer: Self-pay

## 2020-08-14 DIAGNOSIS — R1010 Upper abdominal pain, unspecified: Secondary | ICD-10-CM

## 2020-08-14 DIAGNOSIS — Z79899 Other long term (current) drug therapy: Secondary | ICD-10-CM | POA: Insufficient documentation

## 2020-08-14 DIAGNOSIS — R140 Abdominal distension (gaseous): Secondary | ICD-10-CM | POA: Diagnosis not present

## 2020-08-14 DIAGNOSIS — I10 Essential (primary) hypertension: Secondary | ICD-10-CM | POA: Diagnosis not present

## 2020-08-14 DIAGNOSIS — Z933 Colostomy status: Secondary | ICD-10-CM

## 2020-08-14 DIAGNOSIS — K219 Gastro-esophageal reflux disease without esophagitis: Secondary | ICD-10-CM | POA: Insufficient documentation

## 2020-08-14 LAB — CBC WITH DIFFERENTIAL/PLATELET
Abs Immature Granulocytes: 0.16 10*3/uL — ABNORMAL HIGH (ref 0.00–0.07)
Basophils Absolute: 0 10*3/uL (ref 0.0–0.1)
Basophils Relative: 0 %
Eosinophils Absolute: 0 10*3/uL (ref 0.0–0.5)
Eosinophils Relative: 0 %
HCT: 35.1 % — ABNORMAL LOW (ref 39.0–52.0)
Hemoglobin: 11.7 g/dL — ABNORMAL LOW (ref 13.0–17.0)
Immature Granulocytes: 2 %
Lymphocytes Relative: 7 %
Lymphs Abs: 0.6 10*3/uL — ABNORMAL LOW (ref 0.7–4.0)
MCH: 31.8 pg (ref 26.0–34.0)
MCHC: 33.3 g/dL (ref 30.0–36.0)
MCV: 95.4 fL (ref 80.0–100.0)
Monocytes Absolute: 0.4 10*3/uL (ref 0.1–1.0)
Monocytes Relative: 4 %
Neutro Abs: 7.6 10*3/uL (ref 1.7–7.7)
Neutrophils Relative %: 87 %
Platelets: 207 10*3/uL (ref 150–400)
RBC: 3.68 MIL/uL — ABNORMAL LOW (ref 4.22–5.81)
RDW: 14.9 % (ref 11.5–15.5)
WBC: 8.7 10*3/uL (ref 4.0–10.5)
nRBC: 0 % (ref 0.0–0.2)

## 2020-08-14 LAB — COMPREHENSIVE METABOLIC PANEL
ALT: 23 U/L (ref 0–44)
AST: 20 U/L (ref 15–41)
Albumin: 3.2 g/dL — ABNORMAL LOW (ref 3.5–5.0)
Alkaline Phosphatase: 53 U/L (ref 38–126)
Anion gap: 7 (ref 5–15)
BUN: 22 mg/dL (ref 8–23)
CO2: 26 mmol/L (ref 22–32)
Calcium: 8.9 mg/dL (ref 8.9–10.3)
Chloride: 108 mmol/L (ref 98–111)
Creatinine, Ser: 0.96 mg/dL (ref 0.61–1.24)
GFR, Estimated: >60 mL/min (ref >60)
Glucose, Bld: 111 mg/dL — ABNORMAL HIGH (ref 70–99)
Potassium: 3.5 mmol/L (ref 3.5–5.1)
Sodium: 141 mmol/L (ref 135–145)
Total Bilirubin: 0.7 mg/dL (ref 0.3–1.2)
Total Protein: 5.7 g/dL — ABNORMAL LOW (ref 6.5–8.1)

## 2020-08-14 LAB — LIPASE, BLOOD: Lipase: 62 U/L — ABNORMAL HIGH (ref 11–51)

## 2020-08-14 MED ORDER — SALINE SPRAY 0.65 % NA SOLN
1.0000 | Freq: Once | NASAL | Status: DC
  Filled 2020-08-14: qty 44

## 2020-08-14 MED ORDER — IOHEXOL 300 MG/ML  SOLN
100.0000 mL | Freq: Once | INTRAMUSCULAR | Status: AC | PRN
  Administered 2020-08-14: 100 mL via INTRAVENOUS

## 2020-08-14 NOTE — ED Triage Notes (Signed)
Pt bib [facility] from home.  Pt had abdominal surgery in Nov and has a colostomy from this. Two days ago patient noticed that abdomen is distended and tight.  Pt denies pain, states it feels uncomfortable.  Pt denies nausea, vomiting, or a change in output. EMS VSS.  Pt A&Ox4 on arrival.

## 2020-08-14 NOTE — Discharge Instructions (Addendum)
-  Your CT scan did not show any abnormalities that would cause your abdominal pain or abdominal distention today.  Everything looks the way it should given your surgery. -You are safe to follow-up with your surgeon as scheduled in early April. -Continue to make sure that you are hydrating and eating appropriately. -If your abdomen continues to bother you you may take Motrin or Tylenol for pain.

## 2020-08-14 NOTE — ED Notes (Signed)
Patient verbalizes understanding of discharge instructions. Opportunity for questioning and answers were provided. Armband removed by staff, pt discharged from ED via wheelchair.  

## 2020-08-14 NOTE — ED Notes (Signed)
Pt asking for colostomy change kit, ordered by [place].

## 2020-08-14 NOTE — ED Provider Notes (Signed)
[facility] EMERGENCY DEPARTMENT Provider Note   CSN: [id number] Arrival date & time: 08/14/20  1548     History Chief Complaint  Patient presents with  . Abdominal Pain    Danny Walker is a 75 y.o. male.  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  Patient reports that he had the Hartman's procedure performed about a month ago (per chart review was in December).  He states that he was doing well at home.  2 days ago he noticed that his abdomen got significantly more distended and hard.  Patient states he has mild discomfort with this, however no significant abdominal pain.  Patient denies any fevers or chills.  He has not had any change in his stool output in his ostomy.  He has not had any changes in urination.  He has not had any nausea or vomiting.  Patient denies any other symptoms including shortness of breath, chest pain, headaches and dizziness.  He does not feel sick.  He has been eating like normal.  He has been able to do his daily tasks.  Patient is concerned as he is scheduled for colostomy revision on 6 April.    Past Medical History:  Diagnosis Date  . BPH (benign prostatic hyperplasia)   . DDD (degenerative disc disease), lumbosacral   . Fatty liver   . Foley catheter in place   . History of gallstones   . History of prostatitis   . Hypertension   . IDA (iron deficiency anemia)   . Liver mass 09/17/2001   4.7cm , noted on [place] abd  . Low back pain   . Myasthenia gravis ([place])   . Nocturia   . 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 Spondylosis Lumbar  . Umbilical hernia   . Urinary retention 02/08/2018  . Weakness of both legs 02/2020   Patient Active Problem List   Diagnosis Date Noted  . Bilateral leg edema 06/30/2020  . Superficial bruising of arm, right, initial  encounter 06/30/2020  . Anxiety 06/30/2020  . Debility   . Steroid-induced hyperglycemia   . Acute blood loss anemia   . Diverticulitis 05/01/2020  . Endotracheally intubated   . Perforation of sigmoid colon due to diverticulitis   . Encephalopathy acute   . Peritonitis ([place])   . Leg weakness 03/23/2020  . Visual disturbance 03/23/2020  . Normocytic anemia 03/10/2020  . Myasthenia gravis ([place]) 03/02/2020  . Myasthenia gravis with acute exacerbation ([place]) 01/24/2020  . Hip pain 09/30/2019  . Right shoulder pain 08/20/2019  . Health care maintenance 12/25/2018  . Gastroesophageal reflux disease 04/18/2018  . Essential hypertension 04/18/2018  . Protein-calorie malnutrition, severe 04/06/2018  . 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 Bulbar weakness ([place]) 04/03/2018  . Unintentional weight loss 04/03/2018  . BPH (benign prostatic hyperplasia) 03/05/2018  . Umbilical hernia 29/52/8413    Past Surgical History:  Procedure Laterality Date  . COLON RESECTION SIGMOID N/A 05/01/2020   Procedure: COLON RESECTION SIGMOID;  Surgeon: Donnie Mesa, MD;  Location: [facility];  Service: General;  Laterality: N/A;  . COLOSTOMY N/A 05/01/2020   Procedure: COLOSTOMY;  Surgeon: Donnie Mesa, MD;  Location: [facility];  Service: General;  Laterality: N/A;  . ERCP W/ SPHICTEROTOMY  09/17/2001  . INSERTION OF MESH N/A 02/05/2016   Procedure: INSERTION  OF MESH, UMBILICAL HERNIA;  Surgeon: Armandina Gemma, MD;  Location: [facility];  Service: General;  Laterality: N/A;  . LAPAROSCOPIC CHOLECYSTECTOMY  09/22/2001  . LAPAROTOMY N/A 05/01/2020   Procedure: EXPLORATORY LAPAROTOMY;  Surgeon: Donnie Mesa, MD;  Location: [facility];  Service: General;  Laterality: N/A;  . TRANSURETHRAL RESECTION OF PROSTATE N/A 03/05/2018   Procedure: TRANSURETHRAL RESECTION OF THE PROSTATE (TURP);  Surgeon: Cleon Gustin, MD;  Location: [facility];  Service: Urology;  Laterality: N/A;  .  UMBILICAL HERNIA REPAIR N/A 02/05/2016   Procedure: UMBILICAL HERNIA REPAIR WITH MESH PATCH;  Surgeon: Armandina Gemma, MD;  Location: [facility];  Service: General;  Laterality: N/A;    Family History  Problem Relation Age of Onset  . Hypertension Mother   . Hypertension Father     Social History   Tobacco Use  . Smoking status: Never Smoker  . Smokeless tobacco: Never Used  Vaping Use  . Vaping Use: Never used  Substance Use Topics  . Alcohol use: No  . Drug use: Never    Home Medications Prior to Admission medications   Medication Sig Start Date End Date Taking? Authorizing Provider  acetaminophen (TYLENOL) 325 MG tablet Take 2 tablets (650 mg total) by mouth every 6 (six) hours as needed for mild pain or fever. 05/18/20   Angiulli, Lavon Paganini, PA-C  amLODipine (NORVASC) 5 MG tablet Take 1 tablet (5 mg total) by mouth daily. 05/18/20 05/18/21  Angiulli, Lavon Paganini, PA-C  calcium-vitamin D (OSCAL 500/200 D-3) 500-200 MG-UNIT tablet Take 1 tablet by mouth 2 (two) times daily. 05/18/20   Angiulli, Lavon Paganini, PA-C  diclofenac Sodium (VOLTAREN) 1 % GEL Apply 4 g topically 4 (four) times daily. 06/30/20   Andrew Au, MD  ferrous sulfate 325 (65 FE) MG tablet TOME UNA TABLETA TODOS LOS DIAS 08/03/20   Seawell, Jaimie A, DO  finasteride (PROSCAR) 5 MG tablet Take 1 tablet (5 mg total) by mouth every morning. 05/18/20   Angiulli, Lavon Paganini, PA-C  lisinopril (ZESTRIL) 40 MG tablet TOME UNA TABLETA TODOS LOS DIAS 05/18/20   Angiulli, Lavon Paganini, PA-C  naproxen (NAPROSYN) 250 MG tablet Take 1 tablet (250 mg total) by mouth 2 (two) times daily with a meal. 06/30/20   Andrew Au, MD  omeprazole (PRILOSEC) 40 MG capsule TOME UNA CAPSULA TODOS LOS DIAS EN LA [facility] 05/18/20   Angiulli, Lavon Paganini, PA-C  oxybutynin (DITROPAN) 5 MG tablet Take 1 tablet (5 mg total) by mouth daily. 05/18/20   Angiulli, Lavon Paganini, PA-C  predniSONE (DELTASONE) 20 MG tablet Take 1 tablet (20 mg total) by mouth 2  (two) times daily with a meal. 05/18/20   Angiulli, Lavon Paganini, PA-C  pyridostigmine (MESTINON) 60 MG tablet TAKE 1/2 TABLET AT 7AM, 12PM, AND 5PM EACH DAY. 06/15/20   Narda Amber K, DO    Allergies    Patient has no known allergies.  Review of Systems   Review of Systems  Constitutional: Negative for chills and fever.  HENT: Negative for ear pain and sore throat.   Eyes: Negative for pain and visual disturbance.  Respiratory: Negative for cough and shortness of breath.   Cardiovascular: Negative for chest pain and palpitations.  Gastrointestinal: Positive for abdominal pain. Negative for blood in stool, diarrhea, nausea and vomiting.  Genitourinary: Negative for dysuria and hematuria.  Musculoskeletal: Negative for arthralgias and back pain.  Skin: Negative for color change and rash.  Neurological: Negative for  seizures and syncope.  All other systems reviewed and are negative.   Physical Exam Updated Vital Signs BP (!) 172/99   Pulse 93   Temp 98.7 F (37.1 C) (Oral)   Resp 20   Ht 5\' 6"  (1.676 m)   Wt 70 kg   SpO2 94%   BMI 24.91 kg/m   Physical Exam Vitals and nursing note reviewed.  Constitutional:      Appearance: He is well-developed.  HENT:     Head: Normocephalic and atraumatic.  Eyes:     Conjunctiva/sclera: Conjunctivae normal.  Cardiovascular:     Rate and Rhythm: Normal rate and regular rhythm.  Pulmonary:     Effort: Pulmonary effort is normal. No respiratory distress.     Breath sounds: Normal breath sounds.  Abdominal:     General: There is distension.     Palpations: Abdomen is soft.     Tenderness: There is generalized abdominal tenderness. There is no guarding or rebound.     Comments: Very mild generalized tenderness noted on exam.  Musculoskeletal:     Cervical back: Neck supple.  Skin:    General: Skin is warm and dry.  Neurological:     General: No focal deficit present.     Mental Status: He is alert and oriented to person, place, and  time.  Psychiatric:        Mood and Affect: Mood normal.        Behavior: Behavior normal.    ED Results / Procedures / Treatments   Labs (all labs ordered are listed, but only abnormal results are displayed) Labs Reviewed  COMPREHENSIVE METABOLIC PANEL - Abnormal; Notable for the following components:      Result Value   Glucose, Bld 111 (*)    Total Protein 5.7 (*)    Albumin 3.2 (*)    All other components within normal limits  CBC WITH DIFFERENTIAL/PLATELET - Abnormal; Notable for the following components:   RBC 3.68 (*)    Hemoglobin 11.7 (*)    HCT 35.1 (*)    Lymphs Abs 0.6 (*)    Abs Immature Granulocytes 0.16 (*)    All other components within normal limits  LIPASE, BLOOD - Abnormal; Notable for the following components:   Lipase 62 (*)    All other components within normal limits    EKG EKG Interpretation  Date/Time:  Friday August 14 2020 15:49:33 EDT Ventricular Rate:  91 PR Interval:    QRS Duration: 98 QT Interval:  356 QTC Calculation: 438 R Axis:   2 Text Interpretation: Sinus rhythm Abnormal R-wave progression, early transition ST elevation, consider inferior injury No acute changes Confirmed by Varney Biles [phone]) on 08/14/2020 3:59:01 PM  Radiology CT ABDOMEN PELVIS W CONTRAST  Result Date: 08/14/2020 CLINICAL DATA:  Abdominal distension, colostomy EXAM: CT ABDOMEN AND PELVIS WITH CONTRAST TECHNIQUE: Multidetector CT imaging of the abdomen and pelvis was performed using the standard protocol following bolus administration of intravenous contrast. CONTRAST:  153mL OMNIPAQUE IOHEXOL 300 MG/ML  SOLN COMPARISON:  05/01/2020 FINDINGS: Lower chest: No acute pleural or parenchymal lung disease. Hepatobiliary: Stable 1.4 cm hypodensity right lobe liver likely a small hemangioma. No other focal liver abnormalities. No biliary duct dilation. The gallbladder is surgically absent. Pancreas: Unremarkable. No pancreatic ductal dilatation or surrounding inflammatory  changes. Spleen: Normal in size without focal abnormality. Adrenals/Urinary Tract: Stable right renal cyst. No urinary tract calculi or obstructive uropathy. The adrenals and bladder are unremarkable. Stomach/Bowel: Postsurgical changes are seen  from diverting colostomy left lower quadrant. No bowel obstruction or ileus. There is scattered diverticulosis throughout the colon without diverticulitis. No bowel wall thickening or inflammatory change. Vascular/Lymphatic: Aortic atherosclerosis. No enlarged abdominal or pelvic lymph nodes. Reproductive: Stable enlarged prostate with defect from prior TURP. Other: No free fluid or free gas. There is a fat containing parastomal hernia left mid abdomen. No bowel herniation. Musculoskeletal: No acute or destructive bony lesions. Marked right convex scoliosis with multilevel thoracolumbar spondylosis. Reconstructed images demonstrate no additional findings. IMPRESSION: 1. Postsurgical changes from left lower quadrant colostomy, with fat containing parastomal hernia. 2. Colonic diverticulosis without diverticulitis. 3. Stable enlargement of the prostate, with prior TURP defect. 4.  Aortic Atherosclerosis (ICD10-I70.0). Electronically Signed   By: Randa Ngo M.D.   On: 08/14/2020 18:56    Procedures Procedures   Medications Ordered in ED Medications  sodium chloride (OCEAN) 0.65 % nasal spray 1 spray (has no administration in time range)  iohexol (OMNIPAQUE) 300 MG/ML solution 100 mL (100 mLs Intravenous Contrast Given 08/14/20 1839)    ED Course  I have reviewed the triage vital signs and the nursing notes.  Pertinent labs & imaging results that were available during my care of the patient were reviewed by me and considered in my medical decision making (see chart for details).    MDM Rules/Calculators/A&P                          Patient is a 75 year old male who presents with abdominal pain.  He has a recent history of Hartman's procedure.  He is  supposed to have a reanastomosis in early April.  Patient presents concerned for abdominal distention and increased pain.  He is hemodynamically stable on my evaluation.  Stable blood pressure.  Afebrile.  Heart rate in the 70s.  Saturating 99% on room air.  His abdomen is distended.  He is diffusely tender without any focality to his tenderness.  He has no rebound or guarding.  He has a significant amount of stool in his ostomy bag and therefore I am unable to evaluate the stoma at this time.  However stoma appears nontender and he has made a healthy amount of stool without any blood.  Patient denies any diarrhea.  I have reviewed the patient's medical record.  Last emergency visit was back in December when he presented septic with diverticulitis.  This was when his procedure was performed.  Given presentation and exam, low suspicion but mildly concerned that patient has possible anastomotic leak, obstruction, or other acute intra-abdominal process that could be contributing to his symptoms.  Will order a CT abdomen and pelvis to evaluate patient's abdomen.  Lipase, CBC, and CMP ordered.  CMP without signs for dehydration, electrolyte abnormality, kidney injury, abnormalities in LFTs, GFR normal.  No evidence for acidosis or anion gap.  CBC without leukocytosis.  Lipase mildly elevated at 62, however no evidence for pancreatitis on CT scan.  CT scan as above.  Does not show any acute findings.  Scan shows normal changes given patient surgical history.  No obstruction, infection, or infarction at this time.  Discussed these results with patient at bedside.  Abdominal pain likely nonspecific and probably related to food intake, as patient reports eating more than normal.  He is stable for discharge home.  He can follow-up with his surgeon in early April and his PCP as regularly scheduled.   Final Clinical Impression(s) / ED Diagnoses Final diagnoses:  Pain  of upper abdomen  Abdominal distension   Status post Hartmann's procedure [facility])    Rx / DC Orders ED Discharge Orders    None       Kugler, [place], MD 08/14/20 Ruby, Ankit, MD 08/15/20 [id number]

## 2020-08-17 ENCOUNTER — Encounter (HOSPITAL_COMMUNITY): Payer: Self-pay

## 2020-08-22 ENCOUNTER — Other Ambulatory Visit (HOSPITAL_COMMUNITY)
Admission: RE | Admit: 2020-08-22 | Discharge: 2020-08-22 | Disposition: A | Discharge status: Home / Self Care | Payer: Medicare Other | Source: Ambulatory Visit | Attending: Surgery | Admitting: Surgery

## 2020-08-22 DIAGNOSIS — Z20822 Contact with and (suspected) exposure to covid-19: Secondary | ICD-10-CM | POA: Insufficient documentation

## 2020-08-22 DIAGNOSIS — Z01812 Encounter for preprocedural laboratory examination: Secondary | ICD-10-CM | POA: Insufficient documentation

## 2020-08-22 LAB — SARS CORONAVIRUS 2 (TAT 6-24 HRS): SARS Coronavirus 2: NEGATIVE

## 2020-08-25 ENCOUNTER — Encounter (HOSPITAL_COMMUNITY): Payer: Self-pay | Admitting: Surgery

## 2020-08-25 NOTE — Progress Notes (Signed)
EKG: 08/17/20 CXR: 05/01/20 ECHO: 03/02/20 Stress Test: denies Cardiac Cath: denies  Fasting Blood Sugar- na Checks Blood Sugar_na__ times a day  OSA/CPAP: No  ASA/Blood Thinners: No  Covid test 08/22/20 negative  Anesthesia review: No  Patient denies shortness of breath, fever, cough, and chest pain at PAT appointment.  Patient verbalized understanding of instructions provided today at the PAT appointment.  Patient asked to review instructions at home and day of surgery.

## 2020-08-26 ENCOUNTER — Inpatient Hospital Stay (HOSPITAL_COMMUNITY)
Admission: RE | Admit: 2020-08-26 | Discharge: 2020-08-30 | DRG: 330 | Disposition: A | Discharge status: Home Health | Payer: Medicare Other | Attending: Surgery | Admitting: Surgery

## 2020-08-26 ENCOUNTER — Other Ambulatory Visit: Payer: Self-pay

## 2020-08-26 ENCOUNTER — Inpatient Hospital Stay (HOSPITAL_COMMUNITY): Payer: Medicare Other | Admitting: Anesthesiology

## 2020-08-26 ENCOUNTER — Encounter (HOSPITAL_COMMUNITY): Payer: Self-pay | Admitting: Surgery

## 2020-08-26 ENCOUNTER — Encounter (HOSPITAL_COMMUNITY)
Admission: RE | Disposition: A | Discharge status: Home Health | Payer: Self-pay | Source: Home / Self Care | Attending: Surgery

## 2020-08-26 DIAGNOSIS — Z433 Encounter for attention to colostomy: Principal | ICD-10-CM

## 2020-08-26 DIAGNOSIS — D62 Acute posthemorrhagic anemia: Secondary | ICD-10-CM | POA: Diagnosis not present

## 2020-08-26 DIAGNOSIS — R338 Other retention of urine: Secondary | ICD-10-CM | POA: Diagnosis present

## 2020-08-26 DIAGNOSIS — Z933 Colostomy status: Secondary | ICD-10-CM

## 2020-08-26 DIAGNOSIS — N401 Enlarged prostate with lower urinary tract symptoms: Secondary | ICD-10-CM | POA: Diagnosis present

## 2020-08-26 DIAGNOSIS — G7000 Myasthenia gravis without (acute) exacerbation: Secondary | ICD-10-CM | POA: Diagnosis present

## 2020-08-26 DIAGNOSIS — I10 Essential (primary) hypertension: Secondary | ICD-10-CM | POA: Diagnosis present

## 2020-08-26 DIAGNOSIS — Z79899 Other long term (current) drug therapy: Secondary | ICD-10-CM

## 2020-08-26 DIAGNOSIS — K760 Fatty (change of) liver, not elsewhere classified: Secondary | ICD-10-CM | POA: Diagnosis present

## 2020-08-26 DIAGNOSIS — Z7952 Long term (current) use of systemic steroids: Secondary | ICD-10-CM | POA: Diagnosis not present

## 2020-08-26 DIAGNOSIS — Z20822 Contact with and (suspected) exposure to covid-19: Secondary | ICD-10-CM | POA: Diagnosis present

## 2020-08-26 DIAGNOSIS — K625 Hemorrhage of anus and rectum: Secondary | ICD-10-CM | POA: Diagnosis not present

## 2020-08-26 HISTORY — PX: BOWEL RESECTION: SHX1257

## 2020-08-26 HISTORY — DX: Colostomy status: Z93.3

## 2020-08-26 HISTORY — PX: COLOSTOMY REVERSAL: SHX5782

## 2020-08-26 LAB — HEMOGLOBIN A1C
Hgb A1c MFr Bld: 6.4 % — ABNORMAL HIGH (ref 4.8–5.6)
Mean Plasma Glucose: 136.98 mg/dL

## 2020-08-26 SURGERY — COLOSTOMY REVERSAL
Anesthesia: General | Site: Abdomen

## 2020-08-26 MED ORDER — SUGAMMADEX SODIUM 200 MG/2ML IV SOLN
INTRAVENOUS | Status: DC | PRN
  Administered 2020-08-26: 140 mg via INTRAVENOUS

## 2020-08-26 MED ORDER — PANTOPRAZOLE SODIUM 40 MG PO TBEC
40.0000 mg | DELAYED_RELEASE_TABLET | Freq: Every day | ORAL | Status: DC
  Administered 2020-08-27 – 2020-08-29 (×3): 40 mg via ORAL
  Filled 2020-08-26 (×3): qty 1

## 2020-08-26 MED ORDER — NAPROXEN 250 MG PO TABS
250.0000 mg | ORAL_TABLET | Freq: Two times a day (BID) | ORAL | Status: DC
  Administered 2020-08-26 – 2020-08-29 (×7): 250 mg via ORAL
  Filled 2020-08-26 (×7): qty 1

## 2020-08-26 MED ORDER — PYRIDOSTIGMINE BROMIDE 60 MG PO TABS
30.0000 mg | ORAL_TABLET | Freq: Three times a day (TID) | ORAL | Status: DC
  Administered 2020-08-26 – 2020-08-30 (×11): 30 mg via ORAL
  Filled 2020-08-26 (×13): qty 0.5

## 2020-08-26 MED ORDER — ALUM & MAG HYDROXIDE-SIMETH 200-200-20 MG/5ML PO SUSP: 30.0000 mL | Freq: Four times a day (QID) | ORAL | Status: DC | PRN

## 2020-08-26 MED ORDER — ENSURE PRE-SURGERY PO LIQD: 592.0000 mL | Freq: Once | ORAL | Status: DC

## 2020-08-26 MED ORDER — FENTANYL CITRATE (PF) 100 MCG/2ML IJ SOLN
25.0000 ug | INTRAMUSCULAR | Status: DC | PRN
Start: 2020-08-26 — End: 2020-08-26
  Administered 2020-08-26: 25 ug via INTRAVENOUS

## 2020-08-26 MED ORDER — 0.9 % SODIUM CHLORIDE (POUR BTL) OPTIME
TOPICAL | Status: DC | PRN
  Administered 2020-08-26: 2000 mL
  Administered 2020-08-26: 1000 mL

## 2020-08-26 MED ORDER — SIMETHICONE 80 MG PO CHEW: 40.0000 mg | CHEWABLE_TABLET | Freq: Four times a day (QID) | ORAL | Status: DC | PRN

## 2020-08-26 MED ORDER — CHLORHEXIDINE GLUCONATE CLOTH 2 % EX PADS: 6.0000 | MEDICATED_PAD | Freq: Once | CUTANEOUS | Status: DC

## 2020-08-26 MED ORDER — ONDANSETRON HCL 4 MG/2ML IJ SOLN
INTRAMUSCULAR | Status: AC
  Filled 2020-08-26: qty 2

## 2020-08-26 MED ORDER — FERROUS SULFATE 325 (65 FE) MG PO TABS
325.0000 mg | ORAL_TABLET | Freq: Every day | ORAL | Status: DC
  Administered 2020-08-27 – 2020-08-29 (×3): 325 mg via ORAL
  Filled 2020-08-26 (×3): qty 1

## 2020-08-26 MED ORDER — ENOXAPARIN SODIUM 40 MG/0.4ML ~~LOC~~ SOLN
40.0000 mg | Freq: Once | SUBCUTANEOUS | Status: AC
  Administered 2020-08-26: 40 mg via SUBCUTANEOUS
  Filled 2020-08-26: qty 0.4

## 2020-08-26 MED ORDER — FENTANYL CITRATE (PF) 100 MCG/2ML IJ SOLN
INTRAMUSCULAR | Status: AC
  Administered 2020-08-26: 25 ug via INTRAVENOUS
  Filled 2020-08-26: qty 2

## 2020-08-26 MED ORDER — LISINOPRIL 40 MG PO TABS
40.0000 mg | ORAL_TABLET | Freq: Every day | ORAL | Status: DC
  Administered 2020-08-28 – 2020-08-29 (×2): 40 mg via ORAL
  Filled 2020-08-26 (×3): qty 1

## 2020-08-26 MED ORDER — LIDOCAINE 2% (20 MG/ML) 5 ML SYRINGE
INTRAMUSCULAR | Status: DC | PRN
  Administered 2020-08-26: 100 mg via INTRAVENOUS

## 2020-08-26 MED ORDER — PROPOFOL 10 MG/ML IV BOLUS
INTRAVENOUS | Status: DC | PRN
  Administered 2020-08-26: 140 mg via INTRAVENOUS

## 2020-08-26 MED ORDER — ROCURONIUM BROMIDE 10 MG/ML (PF) SYRINGE
PREFILLED_SYRINGE | INTRAVENOUS | Status: DC | PRN
  Administered 2020-08-26 (×2): 10 mg via INTRAVENOUS
  Administered 2020-08-26: 60 mg via INTRAVENOUS

## 2020-08-26 MED ORDER — ENOXAPARIN SODIUM 40 MG/0.4ML ~~LOC~~ SOLN: 40.0000 mg | SUBCUTANEOUS | Status: DC

## 2020-08-26 MED ORDER — FENTANYL CITRATE (PF) 250 MCG/5ML IJ SOLN
INTRAMUSCULAR | Status: AC
  Filled 2020-08-26: qty 5

## 2020-08-26 MED ORDER — PREDNISONE 20 MG PO TABS
20.0000 mg | ORAL_TABLET | Freq: Two times a day (BID) | ORAL | Status: DC
  Administered 2020-08-26 – 2020-08-29 (×7): 20 mg via ORAL
  Filled 2020-08-26 (×7): qty 1

## 2020-08-26 MED ORDER — ALVIMOPAN 12 MG PO CAPS
12.0000 mg | ORAL_CAPSULE | ORAL | Status: AC
  Administered 2020-08-26: 12 mg via ORAL
  Filled 2020-08-26: qty 1

## 2020-08-26 MED ORDER — GABAPENTIN 300 MG PO CAPS
300.0000 mg | ORAL_CAPSULE | ORAL | Status: AC
  Administered 2020-08-26: 300 mg via ORAL
  Filled 2020-08-26: qty 1

## 2020-08-26 MED ORDER — DIPHENHYDRAMINE HCL 12.5 MG/5ML PO ELIX: 12.5000 mg | ORAL_SOLUTION | Freq: Four times a day (QID) | ORAL | Status: DC | PRN

## 2020-08-26 MED ORDER — OXYBUTYNIN CHLORIDE 5 MG PO TABS
5.0000 mg | ORAL_TABLET | Freq: Every day | ORAL | Status: DC
  Administered 2020-08-26 – 2020-08-29 (×4): 5 mg via ORAL
  Filled 2020-08-26 (×4): qty 1

## 2020-08-26 MED ORDER — ENSURE SURGERY PO LIQD
237.0000 mL | Freq: Two times a day (BID) | ORAL | Status: DC
  Filled 2020-08-26 (×8): qty 237

## 2020-08-26 MED ORDER — MEPERIDINE HCL 25 MG/ML IJ SOLN: 6.2500 mg | INTRAMUSCULAR | Status: DC | PRN

## 2020-08-26 MED ORDER — ENSURE PRE-SURGERY PO LIQD: 296.0000 mL | Freq: Once | ORAL | Status: DC

## 2020-08-26 MED ORDER — DEXAMETHASONE SODIUM PHOSPHATE 10 MG/ML IJ SOLN
INTRAMUSCULAR | Status: DC | PRN
  Administered 2020-08-26: 8 mg via INTRAVENOUS

## 2020-08-26 MED ORDER — SODIUM CHLORIDE 0.9 % IV SOLN
2.0000 g | Freq: Two times a day (BID) | INTRAVENOUS | Status: AC
  Administered 2020-08-26: 2 g via INTRAVENOUS
  Filled 2020-08-26: qty 2

## 2020-08-26 MED ORDER — MORPHINE SULFATE (PF) 2 MG/ML IV SOLN
2.0000 mg | INTRAVENOUS | Status: DC | PRN
  Administered 2020-08-26 (×2): 4 mg via INTRAVENOUS
  Administered 2020-08-27: 2 mg via INTRAVENOUS
  Filled 2020-08-26: qty 1
  Filled 2020-08-26 (×2): qty 2

## 2020-08-26 MED ORDER — DEXAMETHASONE SODIUM PHOSPHATE 10 MG/ML IJ SOLN
INTRAMUSCULAR | Status: AC
  Filled 2020-08-26: qty 1

## 2020-08-26 MED ORDER — POTASSIUM CHLORIDE IN NACL 20-0.9 MEQ/L-% IV SOLN
INTRAVENOUS | Status: DC
  Filled 2020-08-26 (×2): qty 1000

## 2020-08-26 MED ORDER — SODIUM CHLORIDE 0.9 % IV SOLN
2.0000 g | INTRAVENOUS | Status: AC
  Administered 2020-08-26: 2 g via INTRAVENOUS
  Filled 2020-08-26: qty 2

## 2020-08-26 MED ORDER — FENTANYL CITRATE (PF) 100 MCG/2ML IJ SOLN
INTRAMUSCULAR | Status: DC | PRN
  Administered 2020-08-26: 50 ug via INTRAVENOUS
  Administered 2020-08-26: 100 ug via INTRAVENOUS
  Administered 2020-08-26: 50 ug via INTRAVENOUS

## 2020-08-26 MED ORDER — ACETAMINOPHEN 325 MG PO TABS: 325.0000 mg | ORAL_TABLET | ORAL | Status: DC | PRN

## 2020-08-26 MED ORDER — OXYCODONE HCL 5 MG PO TABS: 5.0000 mg | ORAL_TABLET | Freq: Once | ORAL | Status: DC | PRN

## 2020-08-26 MED ORDER — ACETAMINOPHEN 500 MG PO TABS
1000.0000 mg | ORAL_TABLET | ORAL | Status: AC
  Administered 2020-08-26: 1000 mg via ORAL
  Filled 2020-08-26: qty 2

## 2020-08-26 MED ORDER — ONDANSETRON HCL 4 MG/2ML IJ SOLN: 4.0000 mg | Freq: Once | INTRAMUSCULAR | Status: DC | PRN

## 2020-08-26 MED ORDER — OXYCODONE HCL 5 MG PO TABS
5.0000 mg | ORAL_TABLET | ORAL | Status: DC | PRN
  Administered 2020-08-26 – 2020-08-27 (×3): 10 mg via ORAL
  Administered 2020-08-27: 5 mg via ORAL
  Administered 2020-08-27 – 2020-08-29 (×4): 10 mg via ORAL
  Filled 2020-08-26 (×6): qty 2
  Filled 2020-08-26: qty 1
  Filled 2020-08-26: qty 2

## 2020-08-26 MED ORDER — ONDANSETRON HCL 4 MG/2ML IJ SOLN
INTRAMUSCULAR | Status: DC | PRN
  Administered 2020-08-26: 4 mg via INTRAVENOUS

## 2020-08-26 MED ORDER — ONDANSETRON HCL 4 MG PO TABS: 4.0000 mg | ORAL_TABLET | Freq: Four times a day (QID) | ORAL | Status: DC | PRN

## 2020-08-26 MED ORDER — OXYCODONE HCL 5 MG/5ML PO SOLN: 5.0000 mg | Freq: Once | ORAL | Status: DC | PRN

## 2020-08-26 MED ORDER — CALCIUM CARBONATE-VITAMIN D 500-200 MG-UNIT PO TABS
1.0000 | ORAL_TABLET | Freq: Two times a day (BID) | ORAL | Status: DC
  Administered 2020-08-27 – 2020-08-29 (×6): 1 via ORAL
  Filled 2020-08-26 (×6): qty 1

## 2020-08-26 MED ORDER — ACETAMINOPHEN 325 MG PO TABS: 650.0000 mg | ORAL_TABLET | Freq: Four times a day (QID) | ORAL | Status: DC | PRN

## 2020-08-26 MED ORDER — ALVIMOPAN 12 MG PO CAPS
12.0000 mg | ORAL_CAPSULE | Freq: Two times a day (BID) | ORAL | Status: DC
  Administered 2020-08-26 – 2020-08-27 (×3): 12 mg via ORAL
  Filled 2020-08-26 (×4): qty 1

## 2020-08-26 MED ORDER — ACETAMINOPHEN 160 MG/5ML PO SOLN: 325.0000 mg | ORAL | Status: DC | PRN

## 2020-08-26 MED ORDER — LACTATED RINGERS IV SOLN: INTRAVENOUS | Status: DC | PRN

## 2020-08-26 MED ORDER — ONDANSETRON HCL 4 MG/2ML IJ SOLN: 4.0000 mg | Freq: Four times a day (QID) | INTRAMUSCULAR | Status: DC | PRN

## 2020-08-26 MED ORDER — FINASTERIDE 5 MG PO TABS: 5.0000 mg | ORAL_TABLET | Freq: Every day | ORAL | Status: DC

## 2020-08-26 MED ORDER — AMLODIPINE BESYLATE 5 MG PO TABS
5.0000 mg | ORAL_TABLET | Freq: Every day | ORAL | Status: DC
  Administered 2020-08-26 – 2020-08-29 (×3): 5 mg via ORAL
  Filled 2020-08-26 (×4): qty 1

## 2020-08-26 MED ORDER — DIPHENHYDRAMINE HCL 50 MG/ML IJ SOLN: 12.5000 mg | Freq: Four times a day (QID) | INTRAMUSCULAR | Status: DC | PRN

## 2020-08-26 SURGICAL SUPPLY — 44 items
BLADE CLIPPER SURG (BLADE) IMPLANT
CANISTER SUCT 3000ML PPV (MISCELLANEOUS) ×2 IMPLANT
COVER SURGICAL LIGHT HANDLE (MISCELLANEOUS) ×4 IMPLANT
COVER WAND RF STERILE (DRAPES) ×2 IMPLANT
DRSG OPSITE POSTOP 3X4 (GAUZE/BANDAGES/DRESSINGS) ×2 IMPLANT
DRSG OPSITE POSTOP 4X10 (GAUZE/BANDAGES/DRESSINGS) ×2 IMPLANT
DRSG OPSITE POSTOP 4X6 (GAUZE/BANDAGES/DRESSINGS) ×2 IMPLANT
DRSG OPSITE POSTOP 4X8 (GAUZE/BANDAGES/DRESSINGS) IMPLANT
ELECT CAUTERY BLADE 6.4 (BLADE) ×4 IMPLANT
ELECT REM PT RETURN 9FT ADLT (ELECTROSURGICAL) ×2
ELECTRODE REM PT RTRN 9FT ADLT (ELECTROSURGICAL) ×1 IMPLANT
GEL ULTRASOUND 20GR AQUASONIC (MISCELLANEOUS) IMPLANT
GLOVE BIO SURGEON STRL SZ7 (GLOVE) ×4 IMPLANT
GLOVE BIOGEL PI IND STRL 7.5 (GLOVE) ×2 IMPLANT
GLOVE BIOGEL PI INDICATOR 7.5 (GLOVE) ×2
GOWN STRL REUS W/ TWL LRG LVL3 (GOWN DISPOSABLE) ×4 IMPLANT
GOWN STRL REUS W/TWL LRG LVL3 (GOWN DISPOSABLE) ×8
KIT SIGMOIDOSCOPE (SET/KITS/TRAYS/PACK) IMPLANT
KIT TURNOVER KIT B (KITS) ×2 IMPLANT
LIGASURE IMPACT 36 18CM CVD LR (INSTRUMENTS) ×2 IMPLANT
NS IRRIG 1000ML POUR BTL (IV SOLUTION) ×4 IMPLANT
PACK COLON (CUSTOM PROCEDURE TRAY) ×2 IMPLANT
PAD ARMBOARD 7.5X6 YLW CONV (MISCELLANEOUS) ×2 IMPLANT
PENCIL SMOKE EVACUATOR (MISCELLANEOUS) ×2 IMPLANT
RELOAD PROXIMATE 75MM BLUE (ENDOMECHANICALS) ×4 IMPLANT
SPECIMEN JAR MEDIUM (MISCELLANEOUS) ×2 IMPLANT
SPONGE LAP 18X18 RF (DISPOSABLE) IMPLANT
STAPLER CIRC CVD 29MM 37CM (STAPLE) ×2 IMPLANT
STAPLER GUN LINEAR PROX 60 (STAPLE) ×2 IMPLANT
STAPLER PROXIMATE 75MM BLUE (STAPLE) ×2 IMPLANT
STAPLER VISISTAT 35W (STAPLE) ×2 IMPLANT
SURGILUBE 2OZ TUBE FLIPTOP (MISCELLANEOUS) IMPLANT
SUT NOVA 1 T20/GS 25DT (SUTURE) ×2 IMPLANT
SUT NOVA NAB DX-16 0-1 5-0 T12 (SUTURE) ×4 IMPLANT
SUT PDS AB 1 TP1 96 (SUTURE) ×4 IMPLANT
SUT PROLENE 2 0 CT2 30 (SUTURE) IMPLANT
SUT PROLENE 2 0 KS (SUTURE) IMPLANT
SUT SILK 2 0 SH CR/8 (SUTURE) ×2 IMPLANT
SUT SILK 2 0 TIES 10X30 (SUTURE) ×2 IMPLANT
SUT SILK 3 0 SH CR/8 (SUTURE) ×4 IMPLANT
SUT SILK 3 0 TIES 10X30 (SUTURE) ×2 IMPLANT
TOWEL GREEN STERILE FF (TOWEL DISPOSABLE) ×2 IMPLANT
TRAY FOLEY MTR SLVR 16FR STAT (SET/KITS/TRAYS/PACK) ×2 IMPLANT
TUBE CONNECTING 12X1/4 (SUCTIONS) ×4 IMPLANT

## 2020-08-26 NOTE — Transfer of Care (Signed)
Immediate Anesthesia Transfer of Care Note  Patient: Danny Walker  Procedure(s) Performed: COLOSTOMY REVERSAL (N/A Abdomen) SMALL BOWEL RESECTION (N/A Abdomen)  Patient Location: PACU  Anesthesia Type:General  Level of Consciousness: awake, alert  and oriented  Airway & Oxygen Therapy: Patient Spontanous Breathing and Patient connected to nasal cannula oxygen  Post-op Assessment: Report given to RN, Post -op Vital signs reviewed and stable and Patient moving all extremities  Post vital signs: Reviewed and stable  Last Vitals:  Vitals Value Taken Time  BP 145/72 08/26/20 1413  Temp 37 C 08/26/20 1415  Pulse 78 08/26/20 1422  Resp 11 08/26/20 1422  SpO2 99 % 08/26/20 1422  Vitals shown include unvalidated device data.  Last Pain:  Vitals:   08/26/20 0911  TempSrc:   PainSc: 0-No pain         Complications: No complications documented.

## 2020-08-26 NOTE — Anesthesia Postprocedure Evaluation (Signed)
Anesthesia Post Note  Patient: Danny Walker  Procedure(s) Performed: COLOSTOMY REVERSAL (N/A Abdomen) SMALL BOWEL RESECTION (N/A Abdomen)     Patient location during evaluation: PACU Anesthesia Type: General Level of consciousness: awake and alert Pain management: pain level controlled Vital Signs Assessment: post-procedure vital signs reviewed and stable Respiratory status: spontaneous breathing, nonlabored ventilation, respiratory function stable and patient connected to nasal cannula oxygen Cardiovascular status: blood pressure returned to baseline and stable Postop Assessment: no apparent nausea or vomiting Anesthetic complications: no   No complications documented.  Last Vitals:  Vitals:   08/26/20 1817 08/26/20 1946  BP: 103/71 96/66  Pulse: 99 93  Resp: 16 16  Temp: 36.6 C 36.7 C  SpO2: 99% 100%    Last Pain:  Vitals:   08/26/20 2105  TempSrc:   PainSc: 6                  Letrell Attwood

## 2020-08-26 NOTE — Anesthesia Preprocedure Evaluation (Signed)
Anesthesia Evaluation  Patient identified by MRN, date of birth, ID band Patient awake    Reviewed: Allergy & Precautions, NPO status , Patient's Chart, lab work & pertinent test results  Airway Mallampati: II  TM Distance: >3 FB Neck ROM: Full    Dental  (+) Edentulous Lower, Upper Dentures, Lower Dentures, Edentulous Upper   Pulmonary neg pulmonary ROS,    Pulmonary exam normal breath sounds clear to auscultation       Cardiovascular hypertension, Pt. on medications Normal cardiovascular exam Rhythm:Regular Rate:Tachycardia  ECG: ST, rate 118   Neuro/Psych Anxiety Myasthenia gravis   Neuromuscular disease    GI/Hepatic GERD  Medicated and Controlled,  Endo/Other    Renal/GU      Musculoskeletal  (+) Arthritis ,   Abdominal (+) + obese,   Peds  Hematology  (+) Blood dyscrasia, anemia , Thrombocytopenia    Anesthesia Other Findings   Reproductive/Obstetrics                             Anesthesia Physical  Anesthesia Plan  ASA: III  Anesthesia Plan: General   Post-op Pain Management:    Induction: Intravenous  PONV Risk Score and Plan: 2 and Ondansetron, Dexamethasone and Treatment may vary due to age or medical condition  Airway Management Planned: Oral ETT  Additional Equipment:   Intra-op Plan:   Post-operative Plan: Extubation in OR  Informed Consent: I have reviewed the patients History and Physical, chart, labs and discussed the procedure including the risks, benefits and alternatives for the proposed anesthesia with the patient or authorized representative who has indicated his/her understanding and acceptance.     Interpreter used for [employer] Discussed with: CRNA and Anesthesiologist  Anesthesia Plan Comments:         Anesthesia Quick Evaluation

## 2020-08-26 NOTE — Op Note (Signed)
Preop diagnosis: Descending colostomy and Hartman's pouch after perforated sigmoid colon Postop diagnosis: Same Procedure performed: Colostomy reversal, small bowel resection, rigid sigmoidoscopy Surgeon:Hlee Fringer K Mordecai Tindol Assistant: Dr. Christie Beckers Anesthesia: General endotracheal Indications:This is a 75 year old male with myasthenia gravis on chronic prednisone who presented on 05/01/20 with sepsis, hypotension, and tachycardia. CT scan showed diverticulitis with free air tracking up the retroperitoneum to the mediastinum. Despite the high-risk of the procedure, we recommended immediate Hartman's procedure. He has a previous umbilical hernia repair with mesh. The mesh was removed at the exploratory laparotomy. He underwent sigmoid colectomy with descending colostomy. No gross fecal contamination.  He was discharged to rehab one week after surgery and was discharged home 05/19/20. His appetite has returned to normal. He continues to work with physical therapy at home, and is regaining his strength.. His ostomy is functioning well and his wound is healed. He denies any significant pain. He is returning to work this week. He is managing his colostomy well.  He presents now for colostomy reversal.  He remains on steroids.  Description of procedure: The patient is brought to the operating room and placed in the supine position on the operating room table.  After an adequate level of general anesthesia was obtained a Foley catheter was placed under sterile technique.  His legs were placed in lithotomy position in yellowfin stirrups.  I evacuated some inspissated mucus from his rectum manually.  His perineum was prepped with Betadine.  His colostomy was closed with a pursestring suture and was also prepped with Betadine.  The remainder of his abdomen was prepped with ChloraPrep and draped in sterile fashion.  A timeout was taken to ensure the proper patient and proper procedure.  We open his midline  incision and dissected down to the fascia.  We entered the peritoneal cavity.  He has some omental adhesions to the anterior abdominal wall.  These were taken down with cautery.  The Balfour retractor was inserted.  We dissected down into the pelvis.  The rectal stump was identified.  There are several loops of small bowel that are densely adherent to the edge of the mesocolon of the rectal stump.  We spent some time dissecting these loops of bowel away from this area.  It appears that there is a old sterile abscess in this area that involves some of the small bowel.  We freed up all the small bowel.  One section had a fairly significant serosal injury so we resected this short segment with GIA-75 staplers.  The anastomosis was created with GIA-75 and a TX 60 staplers.  A crotch suture of 3-0 silk was placed.  There is virtually no mesenteric defect.  The anastomosis is widely patent.  We then packed all the small bowel away in the upper abdomen.  The meso colon is mildly thickened but the rectal stump itself shows no sign of inflammation.  It is completely mobile.  We then made an elliptical incision around her old colostomy.  We dissected the colostomy away from the subcutaneous tissues and brought it back into the abdomen.  This reaches down into the pelvis with no tension.  We amputated the distal 3 cm of the descending colostomy.  EEA sizers were used and we selected a 29 mm stapler.  A pursestring suture of 2-0 Prolene was placed in the edge of the descending colon.  The anvil was secured with the pursestring suture.  We irrigated the abdomen thoroughly and inspected for hemostasis.  We  assured the proper orientation of the descending colon to avoid any twisting.  Dr. Brantley Stage then went below and passed the EEA stapler up to the end of the rectal stump.  The spike of the EEA stapler was advanced just anterior to the staple line.  This was connected to the anvil.  The stapler was fired.  There were 2 intact  tissue donuts.  The anastomosis does not appear to be under any tension and there is no sign of bleeding.  We compressed the proximal descending colon.  A rigid sigmoidoscopy was performed that showed no gross bleeding from the anastomosis.  Air was insufflated and there is no sign of leak in the pelvis after it had been filled with saline.  We irrigated the abdomen thoroughly and inspected for hemostasis.  The small bowel anastomosis was patent with no sign of leak or bleeding.  We then changed our gowns and gloves in accordance with the colorectal protocol.  The fascia of the ostomy site was closed with interrupted #1 Novafil sutures.  The midline incision was closed with a running double-stranded #1 PDS suture with multiple interrupted 1 Novafil sutures.  Staples were used to close both skin incisions.  Dressings were applied.  The patient was then extubated and brought to the recovery room in stable condition.  All sponge, instrument, and needle counts are correct.  [name]. Georgette Dover, MD, [facility] Surgery  General/ Trauma Surgery   08/26/2020 2:35 PM

## 2020-08-26 NOTE — Anesthesia Procedure Notes (Signed)
Procedure Name: Intubation Date/Time: 08/26/2020 11:51 AM Performed by: Leonor Liv, CRNA Pre-anesthesia Checklist: Patient identified, Emergency Drugs available, Suction available and Patient being monitored Patient Re-evaluated:Patient Re-evaluated prior to induction Oxygen Delivery Method: Circle System Utilized Preoxygenation: Pre-oxygenation with 100% oxygen Induction Type: IV induction Ventilation: Mask ventilation without difficulty Laryngoscope Size: Mac and 4 Grade View: Grade II Tube type: Oral Tube size: 7.5 mm Number of attempts: 1 Airway Equipment and Method: Stylet and Oral airway Placement Confirmation: ETT inserted through vocal cords under direct vision,  positive ETCO2 and breath sounds checked- equal and bilateral Secured at: 23 cm Tube secured with: Tape Dental Injury: Teeth and Oropharynx as per pre-operative assessment

## 2020-08-26 NOTE — H&P (Signed)
  History of Present Illness The patient is a 75 year old male who presents with diverticulitis. Follow-up from emergent surgery on DOW 05/01/20  This is a 75 year old male with myasthenia gravis on chronic prednisone who presented on 05/01/20 with sepsis, hypotension, and tachycardia. CT scan showed diverticulitis with free air tracking up the retroperitoneum to the mediastinum. Despite the high-risk of the procedure, we recommended immediate Hartman's procedure. He has a previous umbilical hernia repair with mesh. The mesh was removed at the exploratory laparotomy. He underwent sigmoid colectomy with descending colostomy. No gross fecal contamination.  He was discharged to rehab one week after surgery and was discharged home 05/19/20. His appetite has returned to normal. He continues to work with physical therapy at home, and is regaining his strength.. His ostomy is functioning well and his wound is healed. He denies any significant pain. He is returning to work this week. He is managing his colostomy well. He would like to wait at least another month before having surgery again to reverse his colostomy.   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 Finasteride (5MG  Tablet, Oral) Active. Rapaflo (8MG  Capsule, Oral) Active. Cyclobenzaprine HCl (10MG  Tablet, Oral) Active. Hydrocodone-Acetaminophen (5-325MG  Tablet, Oral) Active.  Social History  Alcohol use Occasional alcohol use. Caffeine use Coffee, Tea. No drug use Tobacco use Never smoker.  Family History Family history unknown First Degree Relatives  Other Problems Back  Pain Bladder Problems High blood pressure Umbilical Hernia Repair     Physical Exam  The physical exam findings are as follows: Note:Constitutional: WDWN in NAD, conversant, no obvious deformities; resting comfortably Eyes: Pupils equal, round; sclera anicteric; moist conjunctiva; no lid lag HENT: Oral mucosa moist; good dentition Neck: No masses palpated, trachea midline; no thyromegaly Lungs: CTA bilaterally; normal respiratory effort CV: Regular rate and rhythm; no murmurs; extremities well-perfused with no edema Abd: +bowel sounds, soft, non-tender, well-healed midline incision, no palpable organomegaly; no palpable hernias, pink viable left lower quadrant colostomy with brown stool in the bag. Musc: Normal gait; no apparent clubbing or cyanosis in extremities Lymphatic: No palpable cervical or axillary lymphadenopathy Skin: Warm, dry; no sign of jaundice Psychiatric - alert and oriented x 4; calm mood and affect    Assessment & Plan   PERFORATION OF SIGMOID COLON DUE TO DIVERTICULITIS (K57.20)  Current Plans Schedule for Surgery - Colostomy reversal. The surgical procedure has been discussed with the patient. Potential risks, benefits, alternative treatments, and expected outcomes have been explained. All of the patient's questions at this time have been answered. The likelihood of reaching the patient's treatment goal is good. The patient understand the proposed surgical procedure and wishes to proceed.   [name]. Georgette Dover, MD, [facility] Surgery  General/ Trauma Surgery   08/26/2020 11:05 AM

## 2020-08-27 ENCOUNTER — Encounter (HOSPITAL_COMMUNITY): Payer: Self-pay | Admitting: Surgery

## 2020-08-27 LAB — BASIC METABOLIC PANEL
Anion gap: 6 (ref 5–15)
BUN: 23 mg/dL (ref 8–23)
CO2: 27 mmol/L (ref 22–32)
Calcium: 7.8 mg/dL — ABNORMAL LOW (ref 8.9–10.3)
Chloride: 109 mmol/L (ref 98–111)
Creatinine, Ser: 1.36 mg/dL — ABNORMAL HIGH (ref 0.61–1.24)
GFR, Estimated: 55 mL/min — ABNORMAL LOW (ref >60)
Glucose, Bld: 134 mg/dL — ABNORMAL HIGH (ref 70–99)
Potassium: 3.8 mmol/L (ref 3.5–5.1)
Sodium: 142 mmol/L (ref 135–145)

## 2020-08-27 LAB — CBC
HCT: 26.4 % — ABNORMAL LOW (ref 39.0–52.0)
HCT: 24.8 % — ABNORMAL LOW (ref 39.0–52.0)
HCT: 27 % — ABNORMAL LOW (ref 39.0–52.0)
Hemoglobin: 8.5 g/dL — ABNORMAL LOW (ref 13.0–17.0)
Hemoglobin: 7.9 g/dL — ABNORMAL LOW (ref 13.0–17.0)
Hemoglobin: 8.5 g/dL — ABNORMAL LOW (ref 13.0–17.0)
MCH: 30.9 pg (ref 26.0–34.0)
MCH: 30.7 pg (ref 26.0–34.0)
MCH: 30.1 pg (ref 26.0–34.0)
MCHC: 32.2 g/dL (ref 30.0–36.0)
MCHC: 31.9 g/dL (ref 30.0–36.0)
MCHC: 31.5 g/dL (ref 30.0–36.0)
MCV: 96 fL (ref 80.0–100.0)
MCV: 96.5 fL (ref 80.0–100.0)
MCV: 95.7 fL (ref 80.0–100.0)
Platelets: 198 10*3/uL (ref 150–400)
Platelets: 211 10*3/uL (ref 150–400)
Platelets: 198 10*3/uL (ref 150–400)
RBC: 2.75 MIL/uL — ABNORMAL LOW (ref 4.22–5.81)
RBC: 2.57 MIL/uL — ABNORMAL LOW (ref 4.22–5.81)
RBC: 2.82 MIL/uL — ABNORMAL LOW (ref 4.22–5.81)
RDW: 14.8 % (ref 11.5–15.5)
RDW: 14.9 % (ref 11.5–15.5)
RDW: 17.2 % — ABNORMAL HIGH (ref 11.5–15.5)
WBC: 16.2 10*3/uL — ABNORMAL HIGH (ref 4.0–10.5)
WBC: 14 10*3/uL — ABNORMAL HIGH (ref 4.0–10.5)
WBC: 14.5 10*3/uL — ABNORMAL HIGH (ref 4.0–10.5)
nRBC: 0 % (ref 0.0–0.2)
nRBC: 0 % (ref 0.0–0.2)
nRBC: 0 % (ref 0.0–0.2)

## 2020-08-27 LAB — SURGICAL PATHOLOGY

## 2020-08-27 LAB — PREPARE RBC (CROSSMATCH)

## 2020-08-27 MED ORDER — KCL IN DEXTROSE-NACL 20-5-0.45 MEQ/L-%-% IV SOLN
INTRAVENOUS | Status: DC
  Filled 2020-08-27 (×5): qty 1000

## 2020-08-27 MED ORDER — LACTATED RINGERS IV BOLUS
500.0000 mL | Freq: Once | INTRAVENOUS | Status: AC
  Administered 2020-08-27: 500 mL via INTRAVENOUS

## 2020-08-27 MED ORDER — SODIUM CHLORIDE 0.45 % IV BOLUS: 1000.0000 mL | Freq: Once | INTRAVENOUS | Status: DC

## 2020-08-27 MED ORDER — CHLORHEXIDINE GLUCONATE CLOTH 2 % EX PADS: 6.0000 | MEDICATED_PAD | Freq: Every day | CUTANEOUS | Status: DC

## 2020-08-27 MED ORDER — SODIUM CHLORIDE 0.9% IV SOLUTION: Freq: Once | INTRAVENOUS | Status: DC

## 2020-08-27 MED ORDER — ACETAMINOPHEN 325 MG PO TABS
650.0000 mg | ORAL_TABLET | Freq: Four times a day (QID) | ORAL | Status: DC
  Administered 2020-08-27 – 2020-08-30 (×10): 650 mg via ORAL
  Filled 2020-08-27 (×10): qty 2

## 2020-08-27 MED ORDER — LIDOCAINE 5 % EX PTCH
1.0000 | MEDICATED_PATCH | Freq: Every day | CUTANEOUS | Status: DC
  Administered 2020-08-27 – 2020-08-29 (×3): 1 via TRANSDERMAL
  Filled 2020-08-27 (×3): qty 1

## 2020-08-27 NOTE — Progress Notes (Signed)
1 Day Post-Op   Subjective/Chief Complaint: Patient appears comfortable, sore only with coughing Foley still in place   Objective: Vital signs in last 24 hours: Temp:  [97 F (36.1 C)-98.6 F (37 C)] 97.6 F (36.4 C) (04/07 0532) Pulse Rate:  [68-99] 68 (04/07 0532) Resp:  [13-19] 17 (04/07 0532) BP: (93-163)/(57-90) 93/57 (04/07 0532) SpO2:  [92 %-100 %] 99 % (04/07 0532) Weight:  [70.8 kg-76.1 kg] 76.1 kg (04/07 0418) Last BM Date: 08/26/20  Intake/Output from previous day: 04/06 0701 - 04/07 0700 In: 2330.3 [P.O.:480; I.V.:1750.3; IV Piggyback:100] Out: 1325 [Urine:1300; Blood:25] Intake/Output this shift: No intake/output data recorded.  WDWN in NAD Abd - mildly distended; incisions intact - minimal drainage from LLQ ostomy site   Lab Results:  Recent Labs    08/27/20 0233  WBC 16.2*  HGB 8.5*  HCT 26.4*  PLT 198   BMET Recent Labs    08/27/20 0233  NA 142  K 3.8  CL 109  CO2 27  GLUCOSE 134*  BUN 23  CREATININE 1.36*  CALCIUM 7.8*   PT/INR No results for input(s): LABPROT, INR in the last 72 hours. ABG No results for input(s): PHART, HCO3 in the last 72 hours.  Invalid input(s): PCO2, PO2  Studies/Results: No results found.  Anti-infectives: Anti-infectives (From admission, onward)   Start     Dose/Rate Route Frequency Ordered Stop   08/26/20 2300  cefoTEtan (CEFOTAN) 2 g in sodium chloride 0.9 % 100 mL IVPB        2 g 200 mL/hr over 30 Minutes Intravenous Every 12 hours 08/26/20 1549 08/26/20 2240   08/26/20 0915  cefoTEtan (CEFOTAN) 2 g in sodium chloride 0.9 % 100 mL IVPB        2 g 200 mL/hr over 30 Minutes Intravenous On call to O.R. 08/26/20 0908 08/26/20 1214      Assessment/Plan: s/p Procedure(s): COLOSTOMY REVERSAL (N/A) SMALL BOWEL RESECTION (N/A) D/C foley Acute blood loss anemia - hold Lovenox; repeat CBC in AM Ambulation   LOS: 1 day    Danny Walker 08/27/2020

## 2020-08-27 NOTE — Progress Notes (Addendum)
Called to bedside by RN due to pt complaint of increased abd discomfort, feelings of urinary retention, and passing of blood clots per rectum.   I came to the bedside to examine the patient -- video translator was used. The patients step daughter was also present and assisted with translation. Pt states he was fine until he walked with PT. He has had increased lower abdominal pain since he mobilized. He denies light-headedness, nausea, or emesis. Foley was removed this AM. He also c/o dysuria and blood tinged urine when he passed a small amt (50 cc per RN) urine not long ago. He feels he has more urine to expel but he cannot. He did not personally see his bowel movement but per RN he passed a small amt clotted blood per rectum.   Vitals:   08/27/20 1219 08/27/20 1233  BP: (!) 95/51 (!) 107/59  Pulse:    Resp:    Temp:    SpO2:  99%   Exam:  Gen: alert, non-toxic, cooperative, frustrated  CV: RRR on monitor Pulm: normal effort on 1L [place], O2 sats 100% during my exam  Abd: soft, mild distention, sterile honeycomb dressing in place over midline incision/staples with small amt SS strikethrough inferior aspect of bandage, appropriately tender without peritonitis.  Ext: no peripheral edema    Assessment & Plan:  Blood per rectum POD#1 colostomy takedown - likely from staple line, monitor BMs; all vitals are WNL. No signs of symptomatic anemia. Repeat CBC this afternoon, then will repeat q 6h if hgb is down-trending. Transfuse if hgb < 7.0 or if pt becomes symptomatic.   Possible urinary retention - s/p foley removal this AM. Will have RN bladder scan patient; perform I&O cath if >250 cc in bladder for patient comfort. If <200 cc continue observation. Continue home prostate meds.   Post-op pain - schedule tylenol rather than PRN, add lidoderm patch, continue PRN oxycodone    Obie Dredge, [facility] Surgery Please see Amion for pager number during day hours  7:00am-4:30pm

## 2020-08-27 NOTE — Care Management (Signed)
Patient active with [place] for [place]. Will need orders to continue. Magdalen Spatz RN

## 2020-08-27 NOTE — Progress Notes (Addendum)
2225 MD called back. New order received to Bladder Scan and if >250, place foley. Will continue to monitor.    Bladder scan 0 ouput noted-verified x3. According to patient, he haven't urinated after they took out the foley earlier in the AM.- interpreter used. Patient tried to use urinal, few drops was noted. MD on call was notified.   MD ordered 500cc NS bolus. Pt. was given the bolus and used the [facility] after an hour. Pt had another episode of rectal bleed(dark red) with small amount of urine-unsure how much because it was mixed with the blood. He stated that he just urinated small amount and it burns when he urinated. MD on call made aware about this. Pt. is currently resting in bed with call bell within reach.  Will continue to monitor.

## 2020-08-27 NOTE — Progress Notes (Signed)
OT Cancellation Note  Patient Details Name: Danny Walker MRN: [id number] DOB: 12-29-1945   Cancelled Treatment:    Reason Eval/Treat Not Completed: Medical issues which prohibited therapy. Per PT, pt with rectal bleeding, low BP and O2. Pt's RN aware. OT will follow up next available time as appropriate  [name] 08/27/2020, 1:18 PM

## 2020-08-27 NOTE — Evaluation (Signed)
Physical Therapy Evaluation Patient Details Name: Danny Walker MRN: [id number] DOB: 1946-01-18 Today's Date: 08/27/2020   History of Present Illness  Danny Walker is a 75 y/o male who had sepsis in Dec 2021 and underwent colostomy procedure. Pt admitted on 08/26/20 s/p colostomy reversal. PMH includes MG.    Clinical Impression  Pt generally min-mod A for all mobility. Stating that he feels weaker, needing most assistance for trunk control during bed mobility. Able to follow commands with increased time. Upon standing, pt reporting dizziness. Pt found to be positive for orthostatic hypotension. Noticed bleeding upon standing, returned pt to supine, and alerted RN. Abdominal incision intact with no bleeding. Pt found to be bleeding from rectum. Pt left in bed with call bell within reach, bed alarm active, and RN present. Pt would benefit from PT to address strength, improve balance, and decrease risk for falls. Will continue to follow acutely.  BP sitting = 122/55 BP standing at 0 min = 86/61 BP standing a 3 min = 91/53    Follow Up Recommendations SNF;Supervision for mobility/OOB;Other (comment) (May be able to progress to home health)    Equipment Recommendations  None recommended by PT    Recommendations for Other Services       Precautions / Restrictions Precautions Precautions: Fall Precaution Comments: abdominal incision, monitor orthostatics Restrictions Weight Bearing Restrictions: No      Mobility  Bed Mobility Overal bed mobility: Needs Assistance Bed Mobility: Rolling;Sidelying to Sit;Sit to Sidelying Rolling: Min assist Sidelying to sit: Mod assist     Sit to sidelying: Mod assist General bed mobility comments: Use of bedrail, mod A for trunk control    Transfers Overall transfer level: Needs assistance Equipment used: Rolling walker (2 wheeled) Transfers: Sit to/from Stand Sit to Stand: Min assist         General transfer comment: Min A for power up into  standing with height of bed elevated  Ambulation/Gait             General Gait Details: deferred due to hypotension and bleeding from rectum  Stairs            Wheelchair Mobility    Modified Rankin (Stroke Patients Only)       Balance Overall balance assessment: Needs assistance Sitting-balance support: Feet supported;No upper extremity supported Sitting balance-Leahy Scale: Fair       Standing balance-Leahy Scale: Poor Standing balance comment: Reliant on B UE support due to fatigue and pain                             Pertinent Vitals/Pain Pain Assessment: Faces Faces Pain Scale: Hurts even more Pain Location: abdominal incision Pain Descriptors / Indicators: Constant;Discomfort;Guarding;Grimacing;Aching Pain Intervention(s): Monitored during session    Home Living                        Prior Function                 Hand Dominance        Extremity/Trunk Assessment   Upper Extremity Assessment Upper Extremity Assessment: Defer to OT evaluation    Lower Extremity Assessment Lower Extremity Assessment: Overall WFL for tasks assessed       Communication      Cognition Arousal/Alertness: Awake/alert Behavior During Therapy: WFL for tasks assessed/performed Overall Cognitive Status: Within Functional Limits for tasks assessed  General Comments: Stated that he didn't get out of bed so soon following his colostomy surgery, PT explained that physician had ordered PT and this situation could be different than the last time pt was in hospital, pt still questioning why he had to get OOB "so soon" after surgery      General Comments General comments (skin integrity, edema, etc.): Pt received on 2L. Does not use O2 at home. Trialed RA. Occassionly dropped to 88% with strong pleth but rose >90% within 30 seconds. SPO2 dropped to 85% once or twice but questionable pleth. During  standing for BP measurements, SPO2 > 96%. Pt left on 2L and RN made aware    Exercises     Assessment/Plan    PT Assessment Patient needs continued PT services  PT Problem List Decreased strength;Decreased mobility;Decreased safety awareness;Decreased activity tolerance;Decreased balance;Decreased knowledge of use of DME       PT Treatment Interventions DME instruction;Therapeutic exercise;Gait training;Balance training;Neuromuscular re-education;Functional mobility training;Therapeutic activities;Patient/family education    PT Goals (Current goals can be found in the Care Plan section)  Acute Rehab PT Goals Patient Stated Goal: less pain, return home PT Goal Formulation: With patient Time For Goal Achievement: 09/10/20 Potential to Achieve Goals: Good    Frequency Min 3X/week   Barriers to discharge        Co-evaluation               AM-PAC PT "6 Clicks" Mobility  Outcome Measure Help needed turning from your back to your side while in a flat bed without using bedrails?: A Little Help needed moving from lying on your back to sitting on the side of a flat bed without using bedrails?: A Lot Help needed moving to and from a bed to a chair (including a wheelchair)?: A Lot Help needed standing up from a chair using your arms (e.g., wheelchair or bedside chair)?: A Little Help needed to walk in hospital room?: A Lot Help needed climbing 3-5 steps with a railing? : A Lot 6 Click Score: 14    End of Session Equipment Utilized During Treatment: Gait belt Activity Tolerance: Other (comment);Patient limited by pain (Rectal bleeding and orthostatic hypotension) Patient left: in bed;with call bell/phone within reach;with bed alarm set;with nursing/sitter in room Nurse Communication: Mobility status PT Visit Diagnosis: Unsteadiness on feet (R26.81)    Time:  -      Charges:             Danny Walker, SPT

## 2020-08-27 NOTE — Progress Notes (Addendum)
Patient continues to have rectal bleeding. Moderate amount of blood noted after assisting patient on BSC. 1 PRBC done and current V/S  BP130/67 T98.5 PR86 RR15. Repeat CBC at [place]. On call Greer Pickerel MD made aware. Will continue to close monitor patient

## 2020-08-28 LAB — BASIC METABOLIC PANEL
Anion gap: 5 (ref 5–15)
BUN: 30 mg/dL — ABNORMAL HIGH (ref 8–23)
CO2: 24 mmol/L (ref 22–32)
Calcium: 7.6 mg/dL — ABNORMAL LOW (ref 8.9–10.3)
Chloride: 108 mmol/L (ref 98–111)
Creatinine, Ser: 1.67 mg/dL — ABNORMAL HIGH (ref 0.61–1.24)
GFR, Estimated: 43 mL/min — ABNORMAL LOW (ref >60)
Glucose, Bld: 135 mg/dL — ABNORMAL HIGH (ref 70–99)
Potassium: 4.3 mmol/L (ref 3.5–5.1)
Sodium: 137 mmol/L (ref 135–145)

## 2020-08-28 LAB — TYPE AND SCREEN
ABO/RH(D): B POS
Antibody Screen: NEGATIVE
Unit division: 0

## 2020-08-28 LAB — BPAM RBC
Blood Product Expiration Date: 202204142359
ISSUE DATE / TIME: 202204071813
Unit Type and Rh: 7300

## 2020-08-28 MED ORDER — SODIUM CHLORIDE 0.9 % IV SOLN
500.0000 mL | Freq: Once | INTRAVENOUS | Status: AC
  Administered 2020-08-28: 500 mL via INTRAVENOUS

## 2020-08-28 NOTE — Progress Notes (Signed)
2 Days Post-Op   Subjective/Chief Complaint: His daughter interpreted  He feels better today.  He is voiding better and had two bowel movements yesterday (some clot). Hgb improved to 8.5 from 7.9 after transfusion. Tolerating clears without nausea Minimal pain   Objective: Vital signs in last 24 hours: Temp:  [97.4 F (36.3 C)-98.5 F (36.9 C)] 97.4 F (36.3 C) (04/08 0532) Pulse Rate:  [62-86] 62 (04/08 0532) Resp:  [15-19] 15 (04/08 0532) BP: (95-130)/(51-68) 104/66 (04/08 0532) SpO2:  [98 %-100 %] 99 % (04/08 0532) Weight:  [76.8 kg] 76.8 kg (04/08 0500) Last BM Date: 08/26/20  Intake/Output from previous day: 04/07 0701 - 04/08 0700 In: 3953.7 [P.O.:1200; I.V.:2454.7; Blood:299] Out: 430 [Urine:430] Intake/Output this shift: Total I/O In: -  Out: 550 [Urine:550]  WDWN in nAD Abd - soft, incisional tenderness Incisions - clean, intact; minimal drainage; no erythema  Lab Results:  Recent Labs    08/27/20 1427 08/27/20 2304  WBC 14.0* 14.5*  HGB 7.9* 8.5*  HCT 24.8* 27.0*  PLT 211 198   BMET Recent Labs    08/27/20 0233 08/28/20 0208  NA 142 137  K 3.8 4.3  CL 109 108  CO2 27 24  GLUCOSE 134* 135*  BUN 23 30*  CREATININE 1.36* 1.67*  CALCIUM 7.8* 7.6*   PT/INR No results for input(s): LABPROT, INR in the last 72 hours. ABG No results for input(s): PHART, HCO3 in the last 72 hours.  Invalid input(s): PCO2, PO2  Studies/Results: No results found.  Anti-infectives: Anti-infectives (From admission, onward)   Start     Dose/Rate Route Frequency Ordered Stop   08/26/20 2300  cefoTEtan (CEFOTAN) 2 g in sodium chloride 0.9 % 100 mL IVPB        2 g 200 mL/hr over 30 Minutes Intravenous Every 12 hours 08/26/20 1549 08/26/20 2240   08/26/20 0915  cefoTEtan (CEFOTAN) 2 g in sodium chloride 0.9 % 100 mL IVPB        2 g 200 mL/hr over 30 Minutes Intravenous On call to O.R. 08/26/20 0908 08/26/20 1214      Assessment/Plan: s/p  Procedure(s): COLOSTOMY REVERSAL (N/A) SMALL BOWEL RESECTION (N/A) Acute blood loss anemia - probable oozing from staple line; hold Lovenox; repeat CBC in AM; transfuse as needed Advance diet as tolerated PT/ OT Monitor renal function - IV hydration/ encourage PO intake   LOS: 2 days    Maia Petties 08/28/2020

## 2020-08-28 NOTE — Consult Note (Signed)
   [facility] Inpatient Consult   08/28/2020  Danny Walker 07/01/1945 [id number]  [place]  Accountable Care Organization [ACO] Patient: Medicare CMS DCE   Patient screened for hospitalization with noted high risk score for unplanned readmission risk and with 4 admissions in the past 6 months.  Also, to assess for potential [place] Management service needs for post hospital transition.  Review of patient's medical record reveals patient's disposition needs are incomplete at this time.  Plan:  Continue to follow progress and disposition to assess for post hospital care management needs.    For questions contact:   Natividad Brood, RN BSN [facility] Liaison  [phone] business mobile phone Toll free office [phone]  Fax number: [phone] [email or web address]

## 2020-08-28 NOTE — Progress Notes (Signed)
Physical Therapy Treatment Patient Details Name: Danny Walker MRN: [id number] DOB: March 26, 1946 Today's Date: 08/28/2020    History of Present Illness Danny Walker is a 75 y/o male who had sepsis in Dec 2021 and underwent colostomy procedure. Pt admitted on 08/26/20 s/p colostomy reversal. PMH includes MG.    PT Comments    Pt supine in bed he is motivated to move this session.  Pt performed increased gt and no signs of dizziness. Pt eager to return home.  Based on progress will update recommendation to HHPT at this time.  Will plan to f/u tomorrow.    Sitting 140/76 Standing 132/73   Follow Up Recommendations  Home health PT      Equipment Recommendations  None recommended by PT    Recommendations for Other Services       Precautions / Restrictions Precautions Precautions: Fall Precaution Comments: abdominal incision, monitor orthostatics 08/27/20 Restrictions Weight Bearing Restrictions: No    Mobility  Bed Mobility Overal bed mobility: Needs Assistance Bed Mobility: Rolling;Sidelying to Sit;Sit to Sidelying Rolling: Supervision Sidelying to sit: Supervision       General bed mobility comments: No assistance needed.    Transfers Overall transfer level: Needs assistance Equipment used: Rolling walker (2 wheeled) Transfers: Sit to/from Stand Sit to Stand: Min guard         General transfer comment: Cues for hand placement to and from seated surface.  Ambulation/Gait Ambulation/Gait assistance: Min guard Gait Distance (Feet): 350 Feet Assistive device: Rolling walker (2 wheeled) Gait Pattern/deviations: Step-through pattern;Trunk flexed     General Gait Details: Cues for RW safety and erect posture.   Stairs             Wheelchair Mobility    Modified Rankin (Stroke Patients Only)       Balance Overall balance assessment: Needs assistance Sitting-balance support: Feet supported;No upper extremity supported Sitting balance-Leahy Scale: Fair        Standing balance-Leahy Scale: Poor Standing balance comment: Reliant on B UE support due to fatigue and pain                            Cognition Arousal/Alertness: Awake/alert Behavior During Therapy: WFL for tasks assessed/performed Overall Cognitive Status: Within Functional Limits for tasks assessed                                        Exercises General Exercises - Lower Extremity Ankle Circles/Pumps: AROM;Both;10 reps;Seated Quad Sets: AROM;Both;10 reps;Supine Gluteal Sets: AROM;Both;10 reps;Supine Long Arc Quad: AROM;Both;10 reps;Seated Straight Leg Raises: AROM;Both;10 reps;Supine    General Comments        Pertinent Vitals/Pain Pain Assessment: Faces Faces Pain Scale: Hurts even more Pain Location: abdominal incision Pain Descriptors / Indicators: Constant;Discomfort;Guarding;Grimacing;Aching Pain Intervention(s): Monitored during session;Repositioned    Home Living                      Prior Function            PT Goals (current goals can now be found in the care plan section) Acute Rehab PT Goals Patient Stated Goal: less pain, return home Potential to Achieve Goals: Good Progress towards PT goals: Progressing toward goals    Frequency    Min 3X/week      PT Plan Discharge plan needs to be updated  Co-evaluation              AM-PAC PT "6 Clicks" Mobility   Outcome Measure  Help needed turning from your back to your side while in a flat bed without using bedrails?: A Little Help needed moving from lying on your back to sitting on the side of a flat bed without using bedrails?: A Little Help needed moving to and from a bed to a chair (including a wheelchair)?: A Little Help needed standing up from a chair using your arms (e.g., wheelchair or bedside chair)?: A Little Help needed to walk in hospital room?: A Little Help needed climbing 3-5 steps with a railing? : A Little 6 Click Score: 18     End of Session   Activity Tolerance: Patient tolerated treatment well Patient left: with call bell/phone within reach;with nursing/sitter in room;with chair alarm set Nurse Communication: Mobility status PT Visit Diagnosis: Unsteadiness on feet (R26.81)     Time: 2197-5883 PT Time Calculation (min) (ACUTE ONLY): 35 min  Charges:  $Gait Training: 8-22 mins $Therapeutic Exercise: 8-22 mins                     Erasmo Leventhal , PTA Acute Rehabilitation Services Pager [phone] Office [phone]     Taiwana Willison Eli Hose 08/28/2020, 3:23 PM

## 2020-08-28 NOTE — Progress Notes (Signed)
OT Cancellation Note  Patient Details Name: Danny Walker MRN: [id number] DOB: 1945/09/28   Cancelled Treatment:    Reason Eval/Treat Not Completed: Fatigue/lethargy limiting ability to participate. OT will follow up next available time  [name] 08/28/2020, 1:33 PM

## 2020-08-29 LAB — BASIC METABOLIC PANEL
Anion gap: 6 (ref 5–15)
BUN: 20 mg/dL (ref 8–23)
CO2: 26 mmol/L (ref 22–32)
Calcium: 8.4 mg/dL — ABNORMAL LOW (ref 8.9–10.3)
Chloride: 109 mmol/L (ref 98–111)
Creatinine, Ser: 1.18 mg/dL (ref 0.61–1.24)
GFR, Estimated: >60 mL/min (ref >60)
Glucose, Bld: 163 mg/dL — ABNORMAL HIGH (ref 70–99)
Potassium: 4.8 mmol/L (ref 3.5–5.1)
Sodium: 141 mmol/L (ref 135–145)

## 2020-08-29 LAB — CBC
HCT: 25.7 % — ABNORMAL LOW (ref 39.0–52.0)
Hemoglobin: 8.1 g/dL — ABNORMAL LOW (ref 13.0–17.0)
MCH: 29.9 pg (ref 26.0–34.0)
MCHC: 31.5 g/dL (ref 30.0–36.0)
MCV: 94.8 fL (ref 80.0–100.0)
Platelets: 224 10*3/uL (ref 150–400)
RBC: 2.71 MIL/uL — ABNORMAL LOW (ref 4.22–5.81)
RDW: 17.2 % — ABNORMAL HIGH (ref 11.5–15.5)
WBC: 12.1 10*3/uL — ABNORMAL HIGH (ref 4.0–10.5)
nRBC: 0.2 % (ref 0.0–0.2)

## 2020-08-29 NOTE — Progress Notes (Addendum)
Physical Therapy Treatment Patient Details Name: Danny Walker MRN: [id number] DOB: November 04, 1945 Today's Date: 08/29/2020    History of Present Illness Danny Walker is a 75 y/o male who had sepsis in Dec 2021 and underwent colostomy procedure. Pt admitted on 08/26/20 s/p colostomy reversal. PMH includes MG. (Simultaneous filing. User may not have seen previous data.)    PT Comments    Pt received seated EOB, requesting assistance to get up to [facility], agreeable to therapy session and with good participation and fair tolerance for mobility. Pt remains pain limited and only able to perform limited gait trial in room with RW and min guard, no LOB using device. Pt wanting to rest in chair to await OT eval and RN notified he is requesting pain meds. Will plan to give HEP handout and progress gait and balance training next session. Pt continues to benefit from PT services to progress toward functional mobility goals. Continue to recommend HHPT.  Follow Up Recommendations  Home health PT     Equipment Recommendations  None recommended by PT (he reports he has ramp at home does not need to perform steps)    Recommendations for Other Services       Precautions / Restrictions Precautions Precautions: Fall  Precaution Comments: abdominal incision, monitor orthostatics 08/27/20 Restrictions Weight Bearing Restrictions: No    Mobility  Bed Mobility Overal bed mobility:  Received EOB    General bed mobility comments: pt received seated EOB    Transfers Overall transfer level: Needs assistance  Equipment used: Rolling walker (2 wheeled)  Transfers: Sit to/from Stand  Sit to Stand: Supervision         General transfer comment: Cues for hand placement to and from seated surface. Pivot to [facility] with min guard, then gait trial.  Ambulation/Gait Ambulation/Gait assistance: Min guard Gait Distance (Feet): 30 Feet Assistive device: Rolling walker (2 wheeled) Gait Pattern/deviations: Step-through  pattern;Trunk flexed Gait velocity: grossly <0.4 m/s   General Gait Details: Cues for RW safety and erect posture, pt pain limited after ambulation in room/to sink and deferred hallway gait trial, wanting to rest until OT comes to room.      Balance Overall balance assessment: Needs assistance (Simultaneous filing. User may not have seen previous data.) Sitting-balance support: Feet supported;No upper extremity supported (Simultaneous filing. User may not have seen previous data.) Sitting balance-Leahy Scale: Good      Standing balance support: Bilateral upper extremity supported;Single extremity supported  Standing balance-Leahy Scale: Fair  Standing balance comment: Reliant on B UE support due to fatigue and pain                            Cognition Arousal/Alertness: Awake/alert (Simultaneous filing. User may not have seen previous data.) Behavior During Therapy: [facility] for tasks assessed/performed (Simultaneous filing. User may not have seen previous data.) Overall Cognitive Status: Within Functional Limits for tasks assessed (Simultaneous filing. User may not have seen previous data.)                                        Exercises Other Exercises Other Exercises: pt deferring at this time, bring HEP handout next session    General Comments General comments (skin integrity, edema, etc.): pt on RA and no acute distress aside from increased pain, RN notified and VS not further assessed, he denies dizziness.  Pertinent Vitals/Pain Pain Assessment: Faces  Pain Score: 8  Faces Pain Scale: Hurts even more Pain Location: abdominal incision  Pain Descriptors / Indicators: Constant;Discomfort;Guarding;Grimacing;Aching Pain Intervention(s): Monitored during session;Repositioned     Home Living Family/patient expects to be discharged to:: Private residence Living Arrangements: Alone Available Help at Discharge: Family;Other (Comment) Type of Home:  House Home Access: Ramped entrance   Home Layout: One level Home Equipment: Shower seat;Bedside commode;Walker - 2 wheels;Cane - single point;Adaptive equipment      Prior Function Level of Independence: Independent      Comments: had been getting HHPT working on strength and walking with cane but now reports tey lost everything they had from prior as they were driving and working   PT Goals (current goals can now be found in the care plan section) Acute Rehab PT Goals Patient Stated Goal: to return home if the md says im ok  PT Goal Formulation: With patient Time For Goal Achievement: 09/10/20 Potential to Achieve Goals: Good Progress towards PT goals: Progressing toward goals    Frequency    Min 3X/week      PT Plan Current plan remains appropriate       AM-PAC PT "6 Clicks" Mobility   Outcome Measure  Help needed turning from your back to your side while in a flat bed without using bedrails?: None Help needed moving from lying on your back to sitting on the side of a flat bed without using bedrails?: A Little Help needed moving to and from a bed to a chair (including a wheelchair)?: A Little Help needed standing up from a chair using your arms (e.g., wheelchair or bedside chair)?: A Little Help needed to walk in hospital room?: A Little Help needed climbing 3-5 steps with a railing? : A Little 6 Click Score: 19    End of Session Equipment Utilized During Treatment: Gait belt Activity Tolerance: Patient limited by pain Patient left: with call bell/phone within reach;with nursing/sitter in room;with chair alarm set;in chair Nurse Communication: Mobility status;Patient requests pain meds PT Visit Diagnosis: Unsteadiness on feet (R26.81)     Time: 9833-8250 PT Time Calculation (min) (ACUTE ONLY): 36 min  Charges:  $Gait Training: 8-22 mins                     Jacquel Mccamish P., PTA Acute Rehabilitation Services Pager: [phone] Office: [place] 08/29/2020, 12:47 PM

## 2020-08-29 NOTE — Progress Notes (Signed)
Assessment & Plan: POD#3 s/p Procedure(s): COLOSTOMY REVERSAL (N/A) SMALL BOWEL RESECTION (N/A)  Acute blood loss anemia - Hgb stable this AM at 8.1 - repeat in AM  Soft diet  PT/ OT  Monitor renal function - creatinine 1.18 this AM  Discussed with patient and daughter on phone as Optometrist.  Will keep today and check CBC in AM.  If doing well, home tomorrow.        Armandina Gemma, MD       [facility]       Office: [phone]   Chief Complaint: Colostomy reversal  Subjective: Patient in bed, no complaints.  Tolerating soft diet.  Denies blood in stools.  Objective: Vital signs in last 24 hours: Temp:  [98.3 F (36.8 C)-99.1 F (37.3 C)] 98.3 F (36.8 C) (04/09 0530) Pulse Rate:  [74-105] 74 (04/09 0530) Resp:  [16-17] 16 (04/09 0530) BP: (109-128)/(57-72) 128/70 (04/09 0530) SpO2:  [88 %-93 %] 91 % (04/09 0530) Weight:  [74.3 kg] 74.3 kg (04/09 0500) Last BM Date: 08/28/20  Intake/Output from previous day: 04/08 0701 - 04/09 0700 In: 2500.5 [P.O.:660; I.V.:1840.5] Out: 2675 [Urine:2675] Intake/Output this shift: No intake/output data recorded.  Physical Exam: HEENT - sclerae clear, mucous membranes moist Neck - soft Chest - clear bilaterally Cor - RRR Abdomen - soft, protuberant; dressings removed; wounds dry and intact Ext - no edema, non-tender Neuro - alert & oriented, no focal deficits  Lab Results:  Recent Labs    08/27/20 2304 08/29/20 0036  WBC 14.5* 12.1*  HGB 8.5* 8.1*  HCT 27.0* 25.7*  PLT 198 224   BMET Recent Labs    08/28/20 0208 08/29/20 0036  NA 137 141  K 4.3 4.8  CL 108 109  CO2 24 26  GLUCOSE 135* 163*  BUN 30* 20  CREATININE 1.67* 1.18  CALCIUM 7.6* 8.4*   PT/INR No results for input(s): LABPROT, INR in the last 72 hours. Comprehensive Metabolic Panel:    Component Value Date/Time   NA 141 08/29/2020 0036   NA 137 08/28/2020 0208   NA 142 02/03/2020 1504   NA 142 04/15/2019 1403   K 4.8  08/29/2020 0036   K 4.3 08/28/2020 0208   CL 109 08/29/2020 0036   CL 108 08/28/2020 0208   CO2 26 08/29/2020 0036   CO2 24 08/28/2020 0208   BUN 20 08/29/2020 0036   BUN 30 (H) 08/28/2020 0208   BUN 31 (H) 02/03/2020 1504   BUN 22 04/15/2019 1403   CREATININE 1.18 08/29/2020 0036   CREATININE 1.67 (H) 08/28/2020 0208   CREATININE 0.77 01/01/2013 1045   CREATININE 0.81 07/03/2012 2026   GLUCOSE 163 (H) 08/29/2020 0036   GLUCOSE 135 (H) 08/28/2020 0208   CALCIUM 8.4 (L) 08/29/2020 0036   CALCIUM 7.6 (L) 08/28/2020 0208   AST 20 08/14/2020 1644   AST 14 (L) 05/11/2020 0708   ALT 23 08/14/2020 1644   ALT 30 05/11/2020 0708   ALKPHOS 53 08/14/2020 1644   ALKPHOS 95 05/11/2020 0708   BILITOT 0.7 08/14/2020 1644   BILITOT 0.6 05/11/2020 0708   BILITOT 0.2 02/03/2020 1504   PROT 5.7 (L) 08/14/2020 1644   PROT 5.1 (L) 05/11/2020 0708   PROT 7.5 02/03/2020 1504   ALBUMIN 3.2 (L) 08/14/2020 1644   ALBUMIN 2.4 (L) 05/11/2020 0708   ALBUMIN 3.7 02/03/2020 1504    Studies/Results: No results found.    Armandina Gemma 08/29/2020  Patient ID: Zollie Pee  Scheier, male   DOB: Oct 15, 1945, 75 y.o.   MRN: [id number]

## 2020-08-29 NOTE — Evaluation (Signed)
Occupational Therapy Evaluation Patient Details Name: Danny Walker MRN: [id number] DOB: [birth year] Today's Date: 08/29/2020    History of Present Illness [place] is a 75 y/o male who had sepsis in Dec 2021 and underwent colostomy procedure. Pt admitted on 08/26/20 s/p colostomy reversal. PMH includes MG. (Simultaneous filing. User may not have seen previous data.)   Clinical Impression   Pt lives alone with a ramp entrance home and was independent prior to sx. Pt reports they were driving and working with no assisted devices. Pt reported abdominal discomfort and was guarding abdominal in session. Pt was educated on AE to complete LE ADL to decrease discomfort. Pt reported they have a lot of DME at home from their wife they will be able to use. Pt was able to complete sit to stand transfers supervision, ambulation with RW and min guard, toileting tasks with min guard and hygiene with supervision. Pt currently with functional limitations due to the deficits listed below (see OT Problem List). Pt will benefit from skilled OT to increase their safety and independence with ADL and functional mobility for ADL to facilitate discharge to venue listed below.       Follow Up Recommendations  Home health OT;Supervision - Intermittent    Equipment Recommendations       Recommendations for Other Services       Precautions / Restrictions Precautions Precautions: Fall (Simultaneous filing. User may not have seen previous data.) Precaution Comments: abdominal incision, monitor orthostatics 08/27/20 (Simultaneous filing. User may not have seen previous data.) Restrictions Weight Bearing Restrictions: No (Simultaneous filing. User may not have seen previous data.)      Mobility Bed Mobility Overal bed mobility:  (presented in chair  Simultaneous filing. User may not have seen previous data.)             General bed mobility comments: pt received seated EOB    Transfers Overall transfer level:  Needs assistance (Simultaneous filing. User may not have seen previous data.) Equipment used: Rolling walker (2 wheeled) (Simultaneous filing. User may not have seen previous data.) Transfers: Sit to/from Stand (Simultaneous filing. User may not have seen previous data.) Sit to Stand: Supervision (Simultaneous filing. User may not have seen previous data.)         General transfer comment: Cues for hand placement to and from seated surface. Pivot to [facility] with min guard, then gait trial.    Balance Overall balance assessment: Needs assistance (Simultaneous filing. User may not have seen previous data.) Sitting-balance support: Feet supported;No upper extremity supported (Simultaneous filing. User may not have seen previous data.) Sitting balance-Leahy Scale: Good (Simultaneous filing. User may not have seen previous data.)     Standing balance support: Bilateral upper extremity supported;Single extremity supported (Simultaneous filing. User may not have seen previous data.) Standing balance-Leahy Scale: Fair (Simultaneous filing. User may not have seen previous data.) Standing balance comment: Reliant on B UE support due to fatigue and pain                           ADL either performed or assessed with clinical judgement   ADL Overall ADL's : Needs assistance/impaired Eating/Feeding: Independent;Sitting   Grooming: Wash/dry hands;Wash/dry face;Supervision/safety;Cueing for safety;Cueing for sequencing   Upper Body Bathing: Min guard;Sitting;Standing;Cueing for safety;Cueing for sequencing   Lower Body Bathing: Min guard;Sit to/from stand;Cueing for safety;Cueing for sequencing   Upper Body Dressing : Supervision/safety;Sitting;Standing   Lower Body Dressing: Min guard;Sit to/from stand;Cueing  for safety;Cueing for sequencing   Toilet Transfer: Min guard;Cueing for safety;Cueing for sequencing   Toileting- Clothing Manipulation and Hygiene: Supervision/safety;Sit  to/from stand   Tub/ Shower Transfer: Min guard;Cueing for safety;Cueing for sequencing   Functional mobility during ADLs: Surveyor, minerals     Praxis      Pertinent Vitals/Pain Pain Assessment: Faces (Simultaneous filing. User may not have seen previous data.) Pain Score: 8  Faces Pain Scale: Hurts even more Pain Location: abdominal incision (Simultaneous filing. User may not have seen previous data.) Pain Descriptors / Indicators: Constant;Discomfort;Guarding;Grimacing;Aching (Simultaneous filing. User may not have seen previous data.) Pain Intervention(s): Monitored during session;Repositioned (Simultaneous filing. User may not have seen previous data.)     Hand Dominance Right   Extremity/Trunk Assessment Upper Extremity Assessment Upper Extremity Assessment: Overall WFL for tasks assessed   Lower Extremity Assessment Lower Extremity Assessment: Defer to PT evaluation       Communication Communication Communication: Prefers language other than English   Cognition Arousal/Alertness: Awake/alert (Simultaneous filing. User may not have seen previous data.) Behavior During Therapy: [facility] for tasks assessed/performed (Simultaneous filing. User may not have seen previous data.) Overall Cognitive Status: Within Functional Limits for tasks assessed (Simultaneous filing. User may not have seen previous data.)                                     General Comments  pt on RA and no acute distress aside from increased pain, RN notified and VS not further assessed, he denies dizziness.    Exercises Exercises: Other exercises Other Exercises Other Exercises: pt deferring at this time, bring HEP handout next session   Shoulder Instructions      Home Living Family/patient expects to be discharged to:: Private residence Living Arrangements: Alone Available Help at Discharge: Family;Other (Comment) Type of Home:  House Home Access: Ramped entrance     Home Layout: One level     Bathroom Shower/Tub: Tub/shower unit;Other (comment)   Bathroom Toilet: Standard Bathroom Accessibility: Yes   Home Equipment: Shower seat;Bedside commode;Walker - 2 wheels;Cane - single point;Adaptive equipment Adaptive Equipment: Reacher;Sock aid        Prior Functioning/Environment Level of Independence: Independent        Comments: had been getting HHPT working on strength and walking with cane but now reports tey lost everything they had from prior as they were driving and working        OT Problem List: Decreased strength;Decreased activity tolerance;Impaired balance (sitting and/or standing);Decreased safety awareness;Pain      OT Treatment/Interventions: Self-care/ADL training;Therapeutic exercise;DME and/or AE instruction;Therapeutic activities;Patient/family education;Balance training    OT Goals(Current goals can be found in the care plan section) Acute Rehab OT Goals Patient Stated Goal: to return if the md says im ok (Simultaneous filing. User may not have seen previous data.) OT Goal Formulation: With patient Time For Goal Achievement: 09/12/20 Potential to Achieve Goals: Good ADL Goals Pt Will Perform Upper Body Bathing: with modified independence;sitting;standing Pt Will Perform Lower Body Bathing: with modified independence;sit to/from stand Pt Will Perform Tub/Shower Transfer: with modified independence;shower seat;ambulating  OT Frequency: Min 2X/week   Barriers to D/C:            Co-evaluation              AM-PAC OT "6 Clicks"  Daily Activity     Outcome Measure Help from another person eating meals?: None Help from another person taking care of personal grooming?: None Help from another person toileting, which includes using toliet, bedpan, or urinal?: A Little Help from another person bathing (including washing, rinsing, drying)?: A Little Help from another person to put  on and taking off regular upper body clothing?: None Help from another person to put on and taking off regular lower body clothing?: A Little 6 Click Score: 21   End of Session Equipment Utilized During Treatment: Gait belt;Rolling walker  Activity Tolerance: No increased pain;Patient tolerated treatment well Patient left: in chair;with call bell/phone within reach;with chair alarm set  OT Visit Diagnosis: Unsteadiness on feet (R26.81);Other abnormalities of gait and mobility (R26.89);Pain Pain - part of body:  (abdomen)                Time: 1552-0802 OT Time Calculation (min): 40 min Charges:  OT General Charges $OT Visit: 1 Visit OT Evaluation $OT Eval Low Complexity: 1 Low OT Treatments $Self Care/Home Management : 23-37 mins  Joeseph Amor OTR/L  Acute Rehab Services  [phone] office number [phone] pager number   Joeseph Amor 08/29/2020, 12:48 PM

## 2020-08-30 LAB — CBC
HCT: 25.4 % — ABNORMAL LOW (ref 39.0–52.0)
Hemoglobin: 8 g/dL — ABNORMAL LOW (ref 13.0–17.0)
MCH: 29.7 pg (ref 26.0–34.0)
MCHC: 31.5 g/dL (ref 30.0–36.0)
MCV: 94.4 fL (ref 80.0–100.0)
Platelets: 236 10*3/uL (ref 150–400)
RBC: 2.69 MIL/uL — ABNORMAL LOW (ref 4.22–5.81)
RDW: 16.7 % — ABNORMAL HIGH (ref 11.5–15.5)
WBC: 10.6 10*3/uL — ABNORMAL HIGH (ref 4.0–10.5)
nRBC: 0.3 % — ABNORMAL HIGH (ref 0.0–0.2)

## 2020-08-30 MED ORDER — TRAMADOL HCL 50 MG PO TABS: 50.0000 mg | ORAL_TABLET | Freq: Four times a day (QID) | ORAL | 0 refills | Status: DC | PRN

## 2020-08-30 NOTE — Discharge Summary (Signed)
Physician Discharge Summary [facility]  Patient ID: Danny Walker MRN: [id number] DOB/AGE: 1945-10-11 75 y.o.  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  Post op course was complicated by anastomotic bleeding at staple line which has resolved.  Hgb stable at 8.0 following transfusion.  Pain was well controlled.  Tolerated diet. Patient was prepared for discharge home on POD#4.  Consults: None  Treatments: surgery: colostomy closure - Dr. Donnie Mesa  Discharge Exam: Blood pressure (!) 154/78, pulse 75, temperature 98.4 F (36.9 C), temperature source Oral, resp. rate 15, height 5\' 6"  (1.676 m), weight 75.7 kg, SpO2 94 %. HEENT - clear Neck - soft Chest - clear bilaterally Cor - RRR Abd - soft, protuberant; active BS present; wounds dry and intact with staples  Disposition: Home  I discussed the above findings and recommendations with the patient's daughter by speaker phone with her acting as translator for her father.  They understand and agree with plans for discharge home today.  Discharge Instructions    Diet - low sodium heart healthy   Complete by: As directed    Discharge instructions   Complete by: As directed    [facility]  OPEN ABDOMINAL SURGERY: POST OP INSTRUCTIONS  Always review your discharge instruction sheet given to you by the facility where your surgery was performed.  A prescription for pain medication may be given to you upon discharge.  Take your pain medication as prescribed.  If narcotic pain medicine is not needed, then you may take acetaminophen (Tylenol) or ibuprofen (Advil) as needed. Take your usually prescribed medications unless otherwise directed. If you need a refill on your pain  medication, please contact your pharmacy. They will contact our office to request authorization.  Prescriptions will not be filled after 5 pm or on weekends. You should follow a light diet the first few days after arrival home, such as soup and crackers, unless your doctor has advised otherwise. A high-fiber, low fat diet can be resumed as tolerated.  Be sure to include plenty of fluids daily.  Most patients will experience some swelling and bruising in the area of the incision. Ice packs will help. Swelling and bruising can take several days to resolve. It is common to experience some constipation if taking pain medication after surgery.  Increasing fluid intake and taking a stool softener will usually help or prevent this problem from occurring.  A mild laxative (Milk of Magnesia or Miralax) should be taken according to package directions if there are no bowel movements after 48 hours.  You may have steri-strips (small skin tapes) in place directly over the incision.  These strips should be left on the skin for 5-7 days.  Any sutures or staples will be removed at the office during your follow-up visit. You may find that a light gauze bandage over your incision may keep your staples from being rubbed or pulled. You may shower and replace the bandage daily. ACTIVITIES:  You may resume regular (light) daily activities beginning the next day - such as daily self-care, walking, climbing stairs - gradually increasing activities as tolerated.  You may have sexual intercourse when it is comfortable.  Refrain from any heavy lifting or straining until approved by your doctor.  You may drive when you  no longer are taking prescription pain medication, you can comfortably wear a seatbelt, and you can safely maneuver your car and apply brakes. You should see your doctor in the office for a follow-up appointment approximately 2-3 weeks after your surgery.  Make sure that you call for this appointment within a day or two  after you arrive home to insure a convenient appointment time.  WHEN TO CALL YOUR DOCTOR: Fever greater than 101.0 Inability to urinate Persistent nausea and/or vomiting Extreme swelling or bruising Continued bleeding from incision Increased pain, redness, or drainage from the incision Difficulty swallowing or breathing Muscle cramping or spasms Numbness or tingling in hands or around lips  IF YOU HAVE DISABILITY OR FAMILY LEAVE FORMS, YOU MUST BRING THEM TO THE OFFICE FOR PROCESSING.  PLEASE DO NOT GIVE THEM TO YOUR DOCTOR.  The clinic staff is available to answer your questions during regular business hours.  Please don't hesitate to call and ask to speak to one of the nurses if you have concerns.  [facility] [place] Office: [phone]  For further questions, please visit [email or web address]   Discharge wound care:   Complete by: As directed    No dressing needed.  May shower.   Increase activity slowly   Complete by: As directed    No dressing needed   Complete by: As directed      Allergies as of 08/30/2020   No Known Allergies     Medication List    TAKE these medications   acetaminophen 325 MG tablet Commonly known as: TYLENOL Take 2 tablets (650 mg total) by mouth every 6 (six) hours as needed for mild pain or fever.   amLODipine 5 MG tablet Commonly known as: NORVASC Take 1 tablet (5 mg total) by mouth daily.   diclofenac Sodium 1 % Gel Commonly known as: Voltaren Apply 4 g topically 4 (four) times daily. What changed:   when to take this  reasons to take this   ferrous sulfate 325 (65 FE) MG tablet TOME UNA TABLETA TODOS LOS DIAS What changed: See the new instructions.   finasteride 5 MG tablet Commonly known as: PROSCAR TAKE 1 TABLET (5 MG TOTAL) BY MOUTH EVERY MORNING. What changed:   how much to take  how to take this  when to take this   lisinopril 40 MG tablet Commonly known as: ZESTRIL TOME UNA TABLETA TODOS  LOS DIAS What changed:   how much to take  how to take this  when to take this   naproxen 250 MG tablet Commonly known as: NAPROSYN Take 1 tablet (250 mg total) by mouth 2 (two) times daily with a meal.   neomycin-polymyxin b-dexamethasone 3.5-10000-0.1 Oint Commonly known as: MAXITROL Place 1 application into both eyes at bedtime.   omeprazole 40 MG capsule Commonly known as: PRILOSEC TOME UNA CAPSULA TODOS LOS DIAS EN LA [place] What changed:   how much to take  how to take this  when to take this  additional instructions   Oscal 500/200 D-3 500-200 MG-UNIT tablet Generic drug: calcium-vitamin D Take 1 tablet by mouth 2 (two) times daily.   oxybutynin 5 MG tablet Commonly known as: DITROPAN Take 1 tablet (5 mg total) by mouth daily.   Oyster Shell Calcium w/D 500-200 MG-UNIT Tabs Take 1 tablet by mouth 2 (two) times daily.   predniSONE 20 MG tablet Commonly known as: DELTASONE TAKE 1 TABLET (20 MG TOTAL) BY MOUTH TWO TIMES DAILY WITH A MEAL. What changed:  how much to take  how to take this  when to take this  additional instructions   pyridostigmine 60 MG tablet Commonly known as: MESTINON TAKE 1/2 TABLET AT 7AM, 12PM, AND 5PM EACH DAY. What changed: See the new instructions.   SYSTANE COMPLETE OP Place 1 drop into both eyes 3 (three) times daily as needed (dry eyes).   traMADol 50 MG tablet Commonly known as: ULTRAM Take 1-2 tablets (50-100 mg total) by mouth every 6 (six) hours as needed for moderate pain.            Discharge Care Instructions  (From admission, onward)         Start     Ordered   08/30/20 0000  No dressing needed        08/30/20 9672   08/30/20 0000  Discharge wound care:       Comments: No dressing needed.  May shower.   08/30/20 8979          Follow-up Information    Donnie Mesa, MD. Schedule an appointment as soon as possible for a visit in 5 day(s).   Specialty: General Surgery Why: For wound check  and staple removal Contact information: [address] [place] [address] [phone]               Shonn Farruggia, [facility] Office: [phone]   Signed: Armandina Gemma 08/30/2020, 8:53 AM

## 2020-08-30 NOTE — TOC Initial Note (Signed)
Transition of Care [facility]) - Initial/Assessment Note    Patient Details  Name: Danny Walker MRN: [id number] Date of Birth: [birth year]  Transition of Care [facility]) CM/SW Contact:    Bethena Roys, RN Phone Number: 08/30/2020, 10:47 AM  Clinical Narrative:  Case Manager used the video interpreter to speak with the patient. Plan for patient to transition home today. PT/OT recommendations for home health. No orders placed in Epic for resumption of care. Case Manager called [facility] and the office will be able to get orders from [facility]. Patient was previously discharged from the service. Start of care to begin once orders received from the office. Patient states he has durable medical equipment rolling walker, cane, and bedside commode in the home. Patient states he still drives and gets to appointments without any issues. Per patient his step-daughter will pick him up to transport home. No further needs from Case Manager at this time.                   Expected Discharge Plan: [place] Barriers to Discharge: No Barriers Identified   Patient Goals and CMS Choice Patient states their goals for this hospitalization and ongoing recovery are:: to return home   Choice offered to / list presented to : NA (Currently active with [facility])  Expected Discharge Plan and Services Expected Discharge Plan: [place] In-house Referral: Interpreting Services (video interpreter) Discharge Planning Services: CM Consult Post Acute Care Choice: [place] arrangements for the past 2 months: Single Family Home Expected Discharge Date: 08/30/20                 DME Agency: NA       HH Arranged: PT,OT [place] Agency: [place] ([place]) Date HH Agency Contacted: 08/30/20 Time HH Agency Contacted: 1000 Representative spoke with at [place]: Corene Cornea  Prior Living Arrangements/Services Living arrangements for the  past 2 months: [place] with:: Self Patient language and need for interpreter reviewed:: Yes (video interpreter used) Do you feel safe going back to the place where you live?: Yes      Need for Family Participation in Patient Care: No (Comment) Care giver support system in place?: No (comment) Current home services: DME (Pt has rolling walker, cane and bedside commode) Criminal Activity/Legal Involvement Pertinent to Current Situation/Hospitalization: No - Comment as needed  Activities of Daily Living Home Assistive Devices/Equipment: Dentures (specify type) ADL Screening (condition at time of admission) Patient's cognitive ability adequate to safely complete daily activities?: Yes Is the patient deaf or have difficulty hearing?: No Does the patient have difficulty seeing, even when wearing glasses/contacts?: No Does the patient have difficulty concentrating, remembering, or making decisions?: No Patient able to express need for assistance with ADLs?: Yes Does the patient have difficulty dressing or bathing?: No Independently performs ADLs?: Yes (appropriate for developmental age) Does the patient have difficulty walking or climbing stairs?: No Weakness of Legs: None Weakness of Arms/Hands: None  Permission Sought/Granted Permission sought to share information with : Case Manager,Facility Contact Representative,Family Supports Permission granted to share information with : Yes, Verbal Permission Granted     Permission granted to share info w AGENCY: [facility]        Emotional Assessment Appearance:: Appears stated age   Affect (typically observed): Appropriate Orientation: : Oriented to Situation,Oriented to  Time,Oriented to Place,Oriented to Self Alcohol / Substance Use: Not Applicable Psych Involvement: No (comment)  Admission diagnosis:  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 Colostomy  present on admission ([place]) 08/26/2020  . Bilateral leg edema 06/30/2020  . Superficial bruising of arm, right, initial encounter 06/30/2020  . Anxiety 06/30/2020  . Debility   . Steroid-induced hyperglycemia   . Acute blood loss anemia   . Diverticulitis 05/01/2020  . Endotracheally intubated   . Perforation of sigmoid colon due to diverticulitis   . Encephalopathy acute   . Peritonitis ([place])   . Leg weakness 03/23/2020  . Visual disturbance 03/23/2020  . Normocytic anemia 03/10/2020  . Myasthenia gravis ([place]) 03/02/2020  . Myasthenia gravis with acute exacerbation ([place]) 01/24/2020  . Hip pain 09/30/2019  . Right shoulder pain 08/20/2019  . Health care maintenance 12/25/2018  . Gastroesophageal reflux disease 04/18/2018  . Essential hypertension 04/18/2018  . Protein-calorie malnutrition, severe 04/06/2018  . 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 Bulbar weakness ([place]) 04/03/2018  . Unintentional weight loss 04/03/2018  . BPH (benign prostatic hyperplasia) 03/05/2018  . Umbilical hernia 10/62/6948   PCP:  [name], MD Pharmacy:   [facility] [place] [address] [place] [address] Phone: [phone] Fax: [phone]  Readmission Risk Interventions Readmission Risk Prevention Plan 05/06/2020  Transportation Screening Complete  PCP or Specialist Appt within 3-5 Days Complete  HRI or Home Care Consult Complete  Social Work Consult for [place] Planning/Counseling Complete  Palliative Care Screening Complete  Medication Review Press photographer) Complete  Some recent data might be hidden

## 2020-08-30 NOTE — Plan of Care (Signed)
  Patient adequate for DC home.  Daughter at bedside.  DC instructions given verbal and written.  All needs addressed.    Problem: Health Behavior/Discharge Planning: Goal: Ability to manage health-related needs will improve Outcome: Adequate for Discharge   Problem: Clinical Measurements: Goal: Will remain free from infection Outcome: Adequate for Discharge   Problem: Activity: Goal: Risk for activity intolerance will decrease Outcome: Adequate for Discharge   Problem: Nutrition: Goal: Adequate nutrition will be maintained Outcome: Adequate for Discharge

## 2020-09-01 ENCOUNTER — Encounter (HOSPITAL_COMMUNITY): Payer: Self-pay

## 2020-09-01 ENCOUNTER — Emergency Department (HOSPITAL_COMMUNITY)
Admission: EM | Admit: 2020-09-01 | Discharge: 2020-09-01 | Disposition: A | Discharge status: Home / Self Care | Payer: Medicare Other | Attending: Emergency Medicine | Admitting: Emergency Medicine

## 2020-09-01 DIAGNOSIS — Z79899 Other long term (current) drug therapy: Secondary | ICD-10-CM | POA: Diagnosis not present

## 2020-09-01 DIAGNOSIS — L7622 Postprocedural hemorrhage and hematoma of skin and subcutaneous tissue following other procedure: Secondary | ICD-10-CM | POA: Insufficient documentation

## 2020-09-01 DIAGNOSIS — I10 Essential (primary) hypertension: Secondary | ICD-10-CM | POA: Diagnosis not present

## 2020-09-01 DIAGNOSIS — R58 Hemorrhage, not elsewhere classified: Secondary | ICD-10-CM

## 2020-09-01 LAB — URINALYSIS, ROUTINE W REFLEX MICROSCOPIC
Bacteria, UA: NONE SEEN
Bilirubin Urine: NEGATIVE
Glucose, UA: NEGATIVE mg/dL
Ketones, ur: NEGATIVE mg/dL
Leukocytes,Ua: NEGATIVE
Nitrite: NEGATIVE
Protein, ur: NEGATIVE mg/dL
Specific Gravity, Urine: 1.008 (ref 1.005–1.030)
pH: 9 — ABNORMAL HIGH (ref 5.0–8.0)

## 2020-09-01 LAB — CBC
HCT: 27.7 % — ABNORMAL LOW (ref 39.0–52.0)
Hemoglobin: 8.6 g/dL — ABNORMAL LOW (ref 13.0–17.0)
MCH: 29.9 pg (ref 26.0–34.0)
MCHC: 31 g/dL (ref 30.0–36.0)
MCV: 96.2 fL (ref 80.0–100.0)
Platelets: 291 10*3/uL (ref 150–400)
RBC: 2.88 MIL/uL — ABNORMAL LOW (ref 4.22–5.81)
RDW: 16.1 % — ABNORMAL HIGH (ref 11.5–15.5)
WBC: 11.1 10*3/uL — ABNORMAL HIGH (ref 4.0–10.5)
nRBC: 0.6 % — ABNORMAL HIGH (ref 0.0–0.2)

## 2020-09-01 LAB — COMPREHENSIVE METABOLIC PANEL
ALT: 25 U/L (ref 0–44)
AST: 19 U/L (ref 15–41)
Albumin: 2.7 g/dL — ABNORMAL LOW (ref 3.5–5.0)
Alkaline Phosphatase: 63 U/L (ref 38–126)
Anion gap: 11 (ref 5–15)
BUN: 18 mg/dL (ref 8–23)
CO2: 24 mmol/L (ref 22–32)
Calcium: 9.1 mg/dL (ref 8.9–10.3)
Chloride: 104 mmol/L (ref 98–111)
Creatinine, Ser: 1.18 mg/dL (ref 0.61–1.24)
GFR, Estimated: >60 mL/min (ref >60)
Glucose, Bld: 104 mg/dL — ABNORMAL HIGH (ref 70–99)
Potassium: 3.7 mmol/L (ref 3.5–5.1)
Sodium: 139 mmol/L (ref 135–145)
Total Bilirubin: 0.9 mg/dL (ref 0.3–1.2)
Total Protein: 5.4 g/dL — ABNORMAL LOW (ref 6.5–8.1)

## 2020-09-01 LAB — LIPASE, BLOOD: Lipase: 43 U/L (ref 11–51)

## 2020-09-01 LAB — PROTIME-INR
INR: 1.1 (ref 0.8–1.2)
Prothrombin Time: 14.2 seconds (ref 11.4–15.2)

## 2020-09-01 MED ORDER — SODIUM CHLORIDE 0.9 % IV BOLUS
1000.0000 mL | Freq: Once | INTRAVENOUS | Status: AC
  Administered 2020-09-01: 1000 mL via INTRAVENOUS

## 2020-09-01 MED ORDER — OXYCODONE HCL 5 MG PO TABS
5.0000 mg | ORAL_TABLET | Freq: Once | ORAL | Status: AC
  Administered 2020-09-01: 5 mg via ORAL
  Filled 2020-09-01: qty 1

## 2020-09-01 NOTE — ED Notes (Signed)
Pt's family was called about patient being ready for D/C & she informed this RN that she will arrive to pick him up in about 15 minutes.

## 2020-09-01 NOTE — ED Notes (Signed)
Pt showed fear when the blood rubbed off of his belly onto the urinal he was using. This RN explained to him what happened, pt remains worried.

## 2020-09-01 NOTE — ED Provider Notes (Addendum)
[facility] EMERGENCY DEPARTMENT Provider Note   CSN: [id number] Arrival date & time: 09/01/20  0818     History Chief Complaint  Patient presents with  . Bleeding from abd surg site    Bleeding from lower staple site on abd wound from surgery    Danny Walker is a 75 y.o. male.  HPI   75 year old male presents to the emergency department with concern for bleeding from his surgical site.  Patient is Spanish-speaking, interpreter used.  Past medical history includes HTN, BPH, myasthenia gravis.  Patient is postop 6 days from a colostomy reversal, small bowel resection and rigid sigmoidoscopy with Dr. Gershon Crane.  His postop course was complicated by bleeding at the anastomosis site, he became anemic and did require transfusion.  Bleeding stabilized and he was discharged 2 days ago.  Patient presents today because he is having persistent bleeding from the lower part of the incision site again.  He is complaining of pain in the lower abdomen along with fatigue and weakness.  Past Medical History:  Diagnosis Date  . BPH (benign prostatic hyperplasia)   . DDD (degenerative disc disease), lumbosacral   . Fatty liver   . Foley catheter in place   . History of gallstones   . History of prostatitis   . Hypertension   . IDA (iron deficiency anemia)   . Liver mass 09/17/2001   4.7cm , noted on [place] abd  . Low back pain   . Myasthenia gravis ([place])   . Nocturia   . 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 Spondylosis Lumbar  . Umbilical hernia   . Urinary retention 02/08/2018  . Weakness of both legs 02/2020    Patient Active Problem List   Diagnosis Date Noted  . 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 Bilateral leg edema 06/30/2020  . Superficial bruising of arm, right, initial encounter 06/30/2020  . Anxiety 06/30/2020  . Debility   . Steroid-induced hyperglycemia   . Acute blood loss anemia   . Diverticulitis 05/01/2020   . Endotracheally intubated   . Perforation of sigmoid colon due to diverticulitis   . Encephalopathy acute   . Peritonitis ([place])   . Leg weakness 03/23/2020  . Visual disturbance 03/23/2020  . Normocytic anemia 03/10/2020  . Myasthenia gravis ([place]) 03/02/2020  . Myasthenia gravis with acute exacerbation ([place]) 01/24/2020  . Hip pain 09/30/2019  . Right shoulder pain 08/20/2019  . Health care maintenance 12/25/2018  . Gastroesophageal reflux disease 04/18/2018  . Essential hypertension 04/18/2018  . Protein-calorie malnutrition, severe 04/06/2018  . 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 Bulbar weakness ([place]) 04/03/2018  . Unintentional weight loss 04/03/2018  . BPH (benign prostatic hyperplasia) 03/05/2018  . Umbilical hernia 03/49/1791    Past Surgical History:  Procedure Laterality Date  . BOWEL RESECTION N/A 08/26/2020   Procedure: SMALL BOWEL RESECTION;  Surgeon: Donnie Mesa, MD;  Location: [facility];  Service: General;  Laterality: N/A;  . COLON RESECTION SIGMOID N/A 05/01/2020   Procedure: COLON RESECTION SIGMOID;  Surgeon: Donnie Mesa, MD;  Location: [facility];  Service: General;  Laterality: N/A;  . COLOSTOMY N/A 05/01/2020   Procedure: COLOSTOMY;  Surgeon: Donnie Mesa, MD;  Location: [facility];  Service: General;  Laterality: N/A;  . COLOSTOMY REVERSAL N/A 08/26/2020   Procedure: COLOSTOMY REVERSAL;  Surgeon: Donnie Mesa, MD;  Location: [facility];  Service: General;  Laterality: N/A;  . ERCP W/ SPHICTEROTOMY  09/17/2001  . INSERTION OF MESH N/A 02/05/2016   Procedure: INSERTION OF MESH, UMBILICAL HERNIA;  Surgeon: Armandina Gemma, MD;  Location: [facility];  Service: General;  Laterality: N/A;  . LAPAROSCOPIC CHOLECYSTECTOMY  09/22/2001  . LAPAROTOMY N/A 05/01/2020   Procedure: EXPLORATORY LAPAROTOMY;  Surgeon: Donnie Mesa, MD;  Location: [facility];  Service: General;  Laterality: N/A;  . TRANSURETHRAL RESECTION OF PROSTATE N/A 03/05/2018   Procedure:  TRANSURETHRAL RESECTION OF THE PROSTATE (TURP);  Surgeon: Cleon Gustin, MD;  Location: [facility];  Service: Urology;  Laterality: N/A;  . UMBILICAL HERNIA REPAIR N/A 02/05/2016   Procedure: UMBILICAL HERNIA REPAIR WITH MESH PATCH;  Surgeon: Armandina Gemma, MD;  Location: [facility];  Service: General;  Laterality: N/A;       Family History  Problem Relation Age of Onset  . Hypertension Mother   . Hypertension Father     Social History   Tobacco Use  . Smoking status: Never Smoker  . Smokeless tobacco: Never Used  Vaping Use  . Vaping Use: Never used  Substance Use Topics  . Alcohol use: No  . Drug use: Never    Home Medications Prior to Admission medications   Medication Sig Start Date End Date Taking? Authorizing Provider  acetaminophen (TYLENOL) 325 MG tablet Take 2 tablets (650 mg total) by mouth every 6 (six) hours as needed for mild pain or fever. Patient not taking: No sig reported 05/18/20   Angiulli, Lavon Paganini, PA-C  amLODipine (NORVASC) 5 MG tablet Take 1 tablet (5 mg total) by mouth daily. 05/18/20 05/18/21  Angiulli, Lavon Paganini, PA-C  Calcium Carb-Cholecalciferol (OYSTER SHELL CALCIUM W/D) 500-200 MG-UNIT TABS Take 1 tablet by mouth 2 (two) times daily. 08/03/20   [provider]  calcium-vitamin D (OSCAL 500/200 D-3) 500-200 MG-UNIT tablet Take 1 tablet by mouth 2 (two) times daily. Patient not taking: No sig reported 05/18/20   Angiulli, Lavon Paganini, PA-C  diclofenac Sodium (VOLTAREN) 1 % GEL Apply 4 g topically 4 (four) times daily. Patient taking differently: Apply 4 g topically daily as needed (pain). 06/30/20   Andrew Au, MD  ferrous sulfate 325 (65 FE) MG tablet TOME UNA TABLETA TODOS LOS DIAS Patient taking differently: Take 325 mg by mouth daily. 08/03/20   Seawell, Jaimie A, DO  finasteride (PROSCAR) 5 MG tablet TAKE 1 TABLET (5 MG TOTAL) BY MOUTH EVERY MORNING. Patient taking differently: Take 5 mg by mouth daily.  05/18/20 05/18/21  Angiulli, Lavon Paganini, PA-C  lisinopril (ZESTRIL) 40 MG tablet TOME UNA TABLETA TODOS LOS DIAS Patient taking differently: Take 40 mg by mouth daily. 05/18/20 05/18/21  Angiulli, Lavon Paganini, PA-C  naproxen (NAPROSYN) 250 MG tablet Take 1 tablet (250 mg total) by mouth 2 (two) times daily with a meal. 06/30/20   Andrew Au, MD  neomycin-polymyxin b-dexamethasone (MAXITROL) 3.5-10000-0.1 OINT Place 1 application into both eyes at bedtime.    [provider]  omeprazole (PRILOSEC) 40 MG capsule TOME UNA CAPSULA TODOS LOS DIAS EN LA [place] Patient taking differently: Take 40 mg by mouth daily. 05/18/20   Angiulli, Lavon Paganini, PA-C  oxybutynin (DITROPAN) 5 MG tablet Take 1 tablet (5 mg total) by mouth daily. Patient not taking: No sig reported 05/18/20   Angiulli, Lavon Paganini, PA-C  predniSONE (DELTASONE) 20 MG tablet TAKE 1 TABLET (20 MG TOTAL) BY MOUTH TWO TIMES DAILY WITH A MEAL. Patient taking differently: Take 10-20 mg by mouth See admin instructions. Takes  20 mg in the morning and 10 mg at lunch 05/18/20 05/18/21  Angiulli, Lavon Paganini, PA-C  Propylene Glycol (SYSTANE COMPLETE OP) Place 1 drop into both eyes 3 (three) times daily as needed (dry eyes).    [provider]  pyridostigmine (MESTINON) 60 MG tablet TAKE 1/2 TABLET AT 7AM, 12PM, AND 5PM EACH DAY. Patient taking differently: Take 30 mg by mouth 3 (three) times daily. 06/15/20   Patel, Arvin Collard K, DO  traMADol (ULTRAM) 50 MG tablet Take 1-2 tablets (50-100 mg total) by mouth every 6 (six) hours as needed for moderate pain. 08/30/20   Armandina Gemma, MD    Allergies    Patient has no known allergies.  Review of Systems   Review of Systems  Constitutional: Positive for fatigue. Negative for chills and fever.  HENT: Negative for congestion.   Eyes: Negative for visual disturbance.  Respiratory: Negative for shortness of breath.   Cardiovascular: Negative for chest pain.  Gastrointestinal: Positive for abdominal  pain. Negative for diarrhea and vomiting.       Long midline abdominal incision with staples in place, dehiscence at the lower part with persistent dark blood trickling through, patient's lower abdomen and genitals soaked in dried blood.  Genitourinary: Negative for dysuria.  Skin: Positive for wound.  Neurological: Negative for headaches.    Physical Exam Updated Vital Signs BP 104/70 (BP Location: Right Arm)   Pulse (!) 110   Temp 98.8 F (37.1 C)   Resp (!) 24   SpO2 97%   Physical Exam Vitals and nursing note reviewed.  Constitutional:      Appearance: Normal appearance.  HENT:     Head: Normocephalic.     Mouth/Throat:     Mouth: Mucous membranes are moist.  Cardiovascular:     Rate and Rhythm: Tachycardia present.  Pulmonary:     Effort: Pulmonary effort is normal. No respiratory distress.  Abdominal:     Comments: Midline abdominal wall incision with a small area of dehiscence in the inferior portion with active dark red bleeding, no pulsatile bleeding  Skin:    General: Skin is warm.  Neurological:     Mental Status: He is alert and oriented to person, place, and time. Mental status is at baseline.  Psychiatric:        Mood and Affect: Mood normal.     ED Results / Procedures / Treatments   Labs (all labs ordered are listed, but only abnormal results are displayed) Labs Reviewed  LIPASE, BLOOD  COMPREHENSIVE METABOLIC PANEL  CBC  URINALYSIS, ROUTINE W REFLEX MICROSCOPIC  PROTIME-INR    EKG None  Radiology No results found.  Procedures Procedures   Medications Ordered in ED Medications - No data to display  ED Course  I have reviewed the triage vital signs and the nursing notes.  Pertinent labs & imaging results that were available during my care of the patient were reviewed by me and considered in my medical decision making (see chart for details).  Clinical Course as of 09/14/20 2006  Tue Sep 01, 2020  0945 Surgery PA at bedside.  [CS]   1013 Per Surgery PA, he has taken down a few of the staples and packed the wound. He will return in 45-60min to reassess for hemostasis. Hgb is improved from recent discharge. Hemodynamically stable.  [CS]  1209 UA negative.  [CS]  1232 Patient seen by surgery again. No further bleeding. He is cleared for discharge, will follow up in Surgery office in  2 days for wound check.  [CS]    Clinical Course User Index [CS] Truddie Hidden, MD   MDM Rules/Calculators/A&P                          75 year old male who is postop colostomy reversal with small bowel resection and rigid sigmoidoscopy with Dr. Georgette Dover on 08/26/20 presents with recurrent and persistent bleeding from the lower part of his abdominal incision.  Patient did have postoperative complication with bleeding from the same site, he became anemic and required transfusion.  He states the bleeding restarted again this morning.  He is tachycardic on arrival, blood pressure is 104/70.  Blood work has been ordered, general surgery consult has been placed.  Patient signed out to oncoming provider pending general surgery recommendations, lab results and reevaluation.  Final Clinical Impression(s) / ED Diagnoses Final diagnoses:  None    Rx / DC Orders ED Discharge Orders    None       Lorelle Gibbs, DO 09/01/20 0915    Shaketa Serafin, Alvin Critchley, DO 09/14/20 2007

## 2020-09-01 NOTE — ED Triage Notes (Signed)
PT BIB [facility] for bleeding from abdominal surgical site after surgery. VS stable and A&O x4. pt is able to communicate in spanish only and daughter is coming to translate.

## 2020-09-01 NOTE — Consult Note (Signed)
Danny Walker 01-14-1946  [id number].    Requesting MD: Dr. Dina Rich, Dr. Karle Starch Chief Complaint/Reason for Consult: Bleeding from surgical incisoin  A spanish interpreter was used  HPI: Danny Walker is a 75 y.o. male who is POD #6 s/p Colostomy reversal, small bowel resection, rigid sigmoidoscopy by Dr. Georgette Dover on 08/26/20 who presented to the ED via EMS this am for bleeding from surgical site.  Patient reports he was doing well at home until around 7 AM this morning.  He reports he used the restroom and then went downstairs to make breakfast when he started having bleeding from the bottom of his midline incision.  Patient initially tachycardic to 110 on presentation with of 104/70.  He received IV fluids and tachycardia resolved.  Last BP 126/80.  EDP applied pressure dressing as ordered lab work.  We were asked to see.  Reports since discharge on 4/10 he has been doing well at home.  He notes that he has had some soreness at the base of incision especially if he is to cough. He otherwise denies abdominal pain.  He is tolerating regular diet without nausea or vomiting.  He reports BM this morning that was normal and without blood or clots.  No fevers or chills at home.   ROS: Review of Systems  Constitutional: Negative for chills and fever.  Respiratory: Positive for cough. Negative for shortness of breath.   Cardiovascular: Negative for chest pain.  Gastrointestinal: Positive for abdominal pain. Negative for diarrhea, nausea and vomiting.  All other systems reviewed and are negative.   Family History  Problem Relation Age of Onset  . Hypertension Mother   . Hypertension Father     Past Medical History:  Diagnosis Date  . BPH (benign prostatic hyperplasia)   . DDD (degenerative disc disease), lumbosacral   . Fatty liver   . Foley catheter in place   . History of gallstones   . History of prostatitis   . Hypertension   . IDA (iron deficiency anemia)   . Liver mass  09/17/2001   4.7cm , noted on [place] abd  . Low back pain   . Myasthenia gravis ([place])   . Nocturia   . 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 Spondylosis Lumbar  . Umbilical hernia   . Urinary retention 02/08/2018  . Weakness of both legs 02/2020    Past Surgical History:  Procedure Laterality Date  . BOWEL RESECTION N/A 08/26/2020   Procedure: SMALL BOWEL RESECTION;  Surgeon: Donnie Mesa, MD;  Location: [facility];  Service: General;  Laterality: N/A;  . COLON RESECTION SIGMOID N/A 05/01/2020   Procedure: COLON RESECTION SIGMOID;  Surgeon: Donnie Mesa, MD;  Location: [facility];  Service: General;  Laterality: N/A;  . COLOSTOMY N/A 05/01/2020   Procedure: COLOSTOMY;  Surgeon: Donnie Mesa, MD;  Location: [facility];  Service: General;  Laterality: N/A;  . COLOSTOMY REVERSAL N/A 08/26/2020   Procedure: COLOSTOMY REVERSAL;  Surgeon: Donnie Mesa, MD;  Location: [facility];  Service: General;  Laterality: N/A;  . ERCP W/ SPHICTEROTOMY  09/17/2001  . INSERTION OF MESH N/A 02/05/2016   Procedure: INSERTION OF MESH, UMBILICAL HERNIA;  Surgeon: Armandina Gemma, MD;  Location: [facility];  Service: General;  Laterality: N/A;  . LAPAROSCOPIC CHOLECYSTECTOMY  09/22/2001  . LAPAROTOMY N/A 05/01/2020   Procedure: EXPLORATORY LAPAROTOMY;  Surgeon: Donnie Mesa, MD;  Location: [facility];  Service: General;  Laterality:  N/A;  . TRANSURETHRAL RESECTION OF PROSTATE N/A 03/05/2018   Procedure: TRANSURETHRAL RESECTION OF THE PROSTATE (TURP);  Surgeon: Cleon Gustin, MD;  Location[facility];  Service: Urology;  Laterality: N/A;  . UMBILICAL HERNIA REPAIR N/A 02/05/2016   Procedure: UMBILICAL HERNIA REPAIR WITH MESH PATCH;  Surgeon: Armandina Gemma, MD;  Location: [facility];  Service: General;  Laterality: N/A;    Social History:  reports that he has never smoked. He has never used smokeless tobacco. He reports that he does not drink alcohol and does  not use drugs.  Allergies: No Known Allergies  (Not in a hospital admission)    Physical Exam: Blood pressure 126/80, pulse 95, temperature 98.8 F (37.1 C), resp. rate 18, SpO2 98 %. General: pleasant, WD/WN male who is laying in bed in NAD HEENT: head is normocephalic, atraumatic.  Sclera are noninjected.  PERRL.  Ears and nose without any masses or lesions.  Mouth is pink and moist. Dentition fair Heart: regular, rate, and rhythm. No obvious murmurs, gallops, or rubs noted.  Palpable pedal pulses bilaterally  Lungs: CTAB, no wheezes, rhonchi, or rales noted.  Respiratory effort nonlabored Abd: Soft, ND, tenderness at the lower portion of patients midline incision - otherwise NT. No peritonitis. +BS. Prior colostomy site with staples in place c/d/i and without signs of bleeding. Midline wound with staples in place c/d/i. There was some oozing of dark blood at the base of the wound. With patients permission, I removed the bottom 7 staples and opened the base of the wound. There was oozing of blood but no pulsatile bleeding. The wound was packed and pressure was applied. Bleeding stopped with application of pressure alone. The base of the wound remained dry, without pooling of blood for several minutes. The wound was then packed and a pressure dressing was applied. There was no evidence of evisceration.  MS: no BUE/BLE edema, calves soft and nontender Skin: warm and dry with no masses, lesions, or rashes Psych: A&Ox4 with an appropriate affect Neuro: cranial nerves grossly intact, normal speech, thought process intact  Wound base    Results for orders placed or performed during the hospital encounter of 09/01/20 (from the past 48 hour(s))  Lipase, blood     Status: None   Collection Time: 09/01/20  8:41 AM  Result Value Ref Range   Lipase 43 11 - 51 U/L    Comment: Performed at [facility], [place] [address] [place], [place] [address]  Comprehensive metabolic panel     Status:  Abnormal   Collection Time: 09/01/20  8:41 AM  Result Value Ref Range   Sodium 139 135 - 145 mmol/L   Potassium 3.7 3.5 - 5.1 mmol/L   Chloride 104 98 - 111 mmol/L   CO2 24 22 - 32 mmol/L   Glucose, Bld 104 (H) 70 - 99 mg/dL    Comment: Glucose reference range applies only to samples taken after fasting for at least 8 hours.   BUN 18 8 - 23 mg/dL   Creatinine, Ser 1.18 0.61 - 1.24 mg/dL   Calcium 9.1 8.9 - 10.3 mg/dL   Total Protein 5.4 (L) 6.5 - 8.1 g/dL   Albumin 2.7 (L) 3.5 - 5.0 g/dL   AST 19 15 - 41 U/L   ALT 25 0 - 44 U/L   Alkaline Phosphatase 63 38 - 126 U/L   Total Bilirubin 0.9 0.3 - 1.2 mg/dL   GFR, Estimated >60 >60 mL/min  Comment: (NOTE) Calculated using the CKD-EPI Creatinine Equation (2021)    Anion gap 11 5 - 15    Comment: Performed at [facility], [place] [address] [place], [place] [address]  CBC     Status: Abnormal   Collection Time: 09/01/20  8:41 AM  Result Value Ref Range   WBC 11.1 (H) 4.0 - 10.5 K/uL   RBC 2.88 (L) 4.22 - 5.81 MIL/uL   Hemoglobin 8.6 (L) 13.0 - 17.0 g/dL   HCT 27.7 (L) 39.0 - 52.0 %   MCV 96.2 80.0 - 100.0 fL   MCH 29.9 26.0 - 34.0 pg   MCHC 31.0 30.0 - 36.0 g/dL   RDW 16.1 (H) 11.5 - 15.5 %   Platelets 291 150 - 400 K/uL   nRBC 0.6 (H) 0.0 - 0.2 %    Comment: Performed at [facility] [address] [place], [place] [address]  Protime-INR     Status: None   Collection Time: 09/01/20  9:15 AM  Result Value Ref Range   Prothrombin Time 14.2 11.4 - 15.2 seconds   INR 1.1 0.8 - 1.2    Comment: (NOTE) INR goal varies based on device and disease states. Performed at [facility], [place] [address], [place], [place] [address]    No results found.  Anti-infectives (From admission, onward)   None       Assessment/Plan Bleeding from Surgical Incision S/p Colostomy reversal, small bowel resection, rigid sigmoidoscopy by Dr. Georgette Dover on 08/26/20 Danny Walker is a 75 y.o. male who is POD #6 s/p Colostomy  reversal, small bowel resection, rigid sigmoidoscopy by Dr. Georgette Dover on 08/26/20 who we were asked to see for bleeding from the base of his midline wound that began around 7am this morning. On exam there was some oozing of dark blood at the base of the wound. With patients permission, I removed the bottom 7 staples and opened the base of the wound. There was oozing of blood but no pulsatile bleeding. The wound was packed and pressure was applied. Bleeding stopped with application of pressure alone. The base of the wound remained dry, without pooling of blood for several minutes. The wound was then packed and a pressure dressing was applied. Patient was initially tachycardic to 110 on presentation with a BP of 104/70.  He received IV fluids and tachycardia resolved.  Last BP 126/80. Lab work reassuring with hgb up from discharge at 8.6 from 8.0 on 4/10. Will plan to observe in department to ensure that the wound does not re-bleed. If wound remains hemostatic, will plan for d/c home with close follow up in the office and strict return precautions. I discussed this with EDP. Further recs to follow.    Jillyn Ledger, [facility] Surgery 09/01/2020, 10:17 AM Please see Amion for pager number during day hours 7:00am-4:30pm

## 2020-09-01 NOTE — ED Provider Notes (Signed)
Care of the patient assumed at the change of shift. Patient here with post-op bleeding from lower abdominal incision. Surgery to see.   Physical Exam  BP 109/69   Pulse 93   Temp 98.8 F (37.1 C)   Resp 20   SpO2 98%   Physical Exam  ED Course/Procedures   Clinical Course as of 09/01/20 1256  Tue Sep 01, 2020  0945 Surgery PA at bedside.  [CS]  1013 Per Surgery PA, he has taken down a few of the staples and packed the wound. He will return in 45-60min to reassess for hemostasis. Hgb is improved from recent discharge. Hemodynamically stable.  [CS]  1209 UA negative.  [CS]  1232 Patient seen by surgery again. No further bleeding. He is cleared for discharge, will follow up in Surgery office in 2 days for wound check.  [CS]    Clinical Course User Index [CS] Truddie Hidden, MD    Procedures  MDM         Truddie Hidden, MD 09/01/20 1256

## 2020-09-01 NOTE — Discharge Instructions (Signed)
Wet to Dry WOUND CARE: - Change dressing twice daily - Supplies: sterile saline, kerlex, scissors, ABD pads, tape  1. Remove dressing and all packing carefully, moistening with sterile saline as needed to avoid packing/internal dressing sticking to the wound. 2.   Clean edges of skin around the wound with water/gauze, making sure there is no tape debris or leakage left on skin that could cause skin irritation or breakdown. 3.   Dampen and clean kerlex with sterile saline and pack wound from wound base to skin level, making sure to take note of any possible areas of wound tracking, tunneling and packing appropriately. Wound can be packed loosely. Trim kerlex to size if a whole kerlex is not required. 4.   Cover wound with a dry ABD pad and secure with tape.  5.   Write the date/time on the dry dressing/tape to better track when the last dressing change occurred. - apply any skin protectant/powder if recommended by clinician to protect skin/skin folds. - change dressing as needed if leakage occurs, wound gets contaminated, or patient requests to shower. - You may shower daily with wound open and following the shower the wound should be dried and a clean dressing placed.  - Medical grade tape as well as packing supplies can be found at [employer] on [address] or [employer] on [address]. The remaining supplies can be found at your local drug store, [place] etc.

## 2020-09-08 ENCOUNTER — Telehealth: Payer: Self-pay

## 2020-09-08 NOTE — Telephone Encounter (Signed)
That is fine 

## 2020-09-08 NOTE — Telephone Encounter (Signed)
Pls contact Claiborne Billings w/ [facility] [phone]

## 2020-09-08 NOTE — Telephone Encounter (Signed)
Return call to [place], [place] with [facility]  Requesting verbal orders for" PT once a week x 1; twice a week x 2; they once a week x 5". Stated pt had a colostomy reversal and is very weak. VO given - if not appropriate, let me know.  Thanks

## 2020-09-15 ENCOUNTER — Telehealth: Payer: Self-pay

## 2020-09-15 NOTE — Telephone Encounter (Signed)
Noted. He should go to the ED if the hypotension persist and he becomes symptomatic. Otherwise, we will evaluate further next week.

## 2020-09-15 NOTE — Telephone Encounter (Signed)
Received TC from Shelda Pal, Speech Therapist w/ [facility].  She states she is calling to report patient's b/p was 80/50 on Friday, 09/11/20 and that patient was asymptomatic. RN asked if she rechecked his b/p, she states no.  RN asked if there are any add'l visits documented since Friday with a b/p noted.  Shelda Pal looked this information up and states there was a visit yesterday around noon w/ PT and b/p was 116/68. Patient has an upcoming office visit 09/21/20. SChaplin, RN,BSN

## 2020-09-16 ENCOUNTER — Other Ambulatory Visit: Payer: Self-pay | Admitting: Student

## 2020-09-16 NOTE — Telephone Encounter (Signed)
Verbal order request for Home OT from [facility] (OT with [facility]) start date was 04/15. Verbal order given.Regenia Skeeter, Django Nguyen Cassady4/27/20229:10 AM

## 2020-09-17 ENCOUNTER — Telehealth: Payer: Self-pay | Admitting: *Deleted

## 2020-09-17 NOTE — Telephone Encounter (Signed)
Patient has an appointment on Monday. We can evaluate his sleep difficulties and prescribe medications as needed.

## 2020-09-17 NOTE — Telephone Encounter (Signed)
Return call to Claiborne Billings, PT-message left on recorder that MD will evaluate pt's sleeping difficulties during upcoming visit.Despina Hidden Cassady4/28/20221:54 PM  attemped to contact pt-  # not in service  Will mail appt reminder to pt for upcoming appt .Despina Hidden Cassady4/28/20221:57 PM

## 2020-09-17 NOTE — Telephone Encounter (Signed)
Call from [facility] with [facility], stated they will be discharging pt since he's going back to work.  Pt main concern is not sleeping; stated he has to sleep on his back d/t abd wounds from colostomy reversal. He has been taking 20 mg of melatonin but wakes up after 2 hrs. Wanting to know if the doctor would prescribe something?

## 2020-09-21 ENCOUNTER — Encounter: Payer: Medicare Other | Admitting: Internal Medicine

## 2020-09-21 NOTE — Progress Notes (Deleted)
   CC: ***  HPI:  [name] is a 75 y.o. male with a history of hypertension, myasthenia gravis, BPH, DDD, iron deficiency anemia who is presenting for routine follow up.   Past Medical History:  Diagnosis Date  . BPH (benign prostatic hyperplasia)   . DDD (degenerative disc disease), lumbosacral   . Fatty liver   . Foley catheter in place   . History of gallstones   . History of prostatitis   . Hypertension   . IDA (iron deficiency anemia)   . Liver mass 09/17/2001   4.7cm , noted on [place] abd  . Low back pain   . Myasthenia gravis ([place])   . Nocturia   . 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 Spondylosis Lumbar  . Umbilical hernia   . Urinary retention 02/08/2018  . Weakness of both legs 02/2020   Review of Systems:  ***  Physical Exam:  There were no vitals filed for this visit. ***  Assessment & Plan:   See Encounters Tab for problem based charting.  Patient {GC/GE:[id number]::"discussed with","seen with"} Dr. {NAMES:[id number]::"Butcher","Guilloud","Hoffman","Mullen","Narendra","Raines","Vincent"}

## 2020-10-08 ENCOUNTER — Encounter: Payer: Self-pay | Admitting: *Deleted

## 2020-10-08 NOTE — Progress Notes (Signed)
Things That May Be Affecting Your Health:  Alcohol  Hearing loss  Pain    Depression x Home Safety  Sexual Health   Diabetes  Lack of physical activity  Stress   Difficulty with daily activities  Loneliness  Tiredness   Drug use x Medicines  Tobacco use   Falls  Motor Vehicle Safety x Weight   Food choices  Oral Health  Other    YOUR PERSONALIZED HEALTH PLAN : 1. Schedule your next subsequent Medicare Wellness visit in one year 2. Attend all of your regular appointments to address your medical issues 3. Complete the preventative screenings and services   Annual Wellness Visit   Medicare Covered Preventative Screenings and Long Men and Women Who How Often Need? Date of Last Service Action  Abdominal Aortic Aneurysm Adults with AAA risk factors Once      Alcohol Misuse and Counseling All Adults Screening once a year if no alcohol misuse. Counseling up to 4 face to face sessions.     Bone Density Measurement  Adults at risk for osteoporosis Once every 2 yrs      Lipid Panel Z13.6 All adults without CV disease Once every 5 yrs       Colorectal Cancer   Stool sample or  Colonoscopy All adults 79 and older   Once every year  Every 10 years x       Depression All Adults Once a year  Today   Diabetes Screening Blood glucose, post glucose load, or GTT Z13.1  All adults at risk  Pre-diabetics  Once per year  Twice per year      Diabetes  Self-Management Training All adults Diabetics 10 hrs first year; 2 hours subsequent years. Requires Copay     Glaucoma  Diabetics  Family history of glaucoma  African Americans 91 yrs +  Hispanic Americans 98 yrs + Annually - requires coppay      Hepatitis C Z72.89 or F19.20  High Risk for HCV  Born between 1945 and 1965  Annually  Once      HIV Z11.4 All adults based on risk  Annually btw ages 106 & 73 regardless of risk  Annually > 65 yrs if at increased risk      Lung Cancer Screening  Asymptomatic adults aged 75-77 with 30 pack yr history and current smoker OR quit within the last 15 yrs Annually Must have counseling and shared decision making documentation before first screen      Medical Nutrition Therapy Adults with   Diabetes  Renal disease  Kidney transplant within past 3 yrs 3 hours first year; 2 hours subsequent years     Obesity and Counseling All adults Screening once a year Counseling if BMI 30 or higher  Today   Tobacco Use Counseling Adults who use tobacco  Up to 8 visits in one year     Vaccines Z23  Hepatitis B  Influenza   Pneumonia  Adults   Once  Once every flu season  Two different vaccines separated by one year x    Next Annual Wellness Visit People with Medicare Every year  Today     Services & Screenings Women Who How Often Need  Date of Last Service Action  Mammogram  Z12.31 Women over 71 One baseline ages 66-39. Annually ager 40 yrs+      Pap tests All women Annually if high risk. Every 2 yrs for normal risk women  Screening for cervical cancer with   Pap (Z01.419 nl or Z01.411abnl) &  HPV Z11.51 Women aged 51 to 26 Once every 5 yrs     Screening pelvic and breast exams All women Annually if high risk. Every 2 yrs for normal risk women     Sexually Transmitted Diseases  Chlamydia  Gonorrhea  Syphilis All at risk adults Annually for non pregnant females at increased risk         [place] Men Who How Ofter Need  Date of Last Service Action  Prostate Cancer - DRE & PSA Men over 50 Annually.  DRE might require a copay.        Sexually Transmitted Diseases  Syphilis All at risk adults Annually for men at increased risk      Health Maintenance List Health Maintenance  Topic Date Due  . TETANUS/TDAP  Never done  . COLON CANCER SCREENING ANNUAL FOBT  08/17/2020  . INFLUENZA VACCINE  12/21/2020  . PNA vac Low Risk Adult (2 of 2 - PCV13) 03/03/2021  . COVID-19 Vaccine  Completed  . Hepatitis C  Screening  Completed  . HPV VACCINES  Aged Out  . COLONOSCOPY (Pts 45-58yrs Insurance coverage will need to be confirmed)  Discontinued

## 2020-10-08 NOTE — Progress Notes (Signed)

## 2020-10-17 ENCOUNTER — Encounter: Payer: Self-pay | Admitting: *Deleted

## 2020-10-26 ENCOUNTER — Other Ambulatory Visit: Payer: Self-pay | Admitting: Internal Medicine

## 2020-10-30 ENCOUNTER — Other Ambulatory Visit: Payer: Self-pay | Admitting: Neurology

## 2020-10-30 DIAGNOSIS — K219 Gastro-esophageal reflux disease without esophagitis: Secondary | ICD-10-CM

## 2020-11-10 ENCOUNTER — Other Ambulatory Visit: Payer: Self-pay | Admitting: Neurology

## 2020-11-10 DIAGNOSIS — K219 Gastro-esophageal reflux disease without esophagitis: Secondary | ICD-10-CM

## 2020-11-24 ENCOUNTER — Encounter: Payer: Self-pay | Admitting: *Deleted

## 2020-12-01 ENCOUNTER — Encounter: Payer: Self-pay | Admitting: Internal Medicine

## 2020-12-01 ENCOUNTER — Ambulatory Visit (INDEPENDENT_AMBULATORY_CARE_PROVIDER_SITE_OTHER): Payer: Medicare Other | Admitting: Internal Medicine

## 2020-12-01 ENCOUNTER — Other Ambulatory Visit: Payer: Self-pay

## 2020-12-01 VITALS — BP 149/77 | HR 70 | Temp 99.8°F | Ht 66.0 in | Wt 146.5 lb

## 2020-12-01 DIAGNOSIS — R600 Localized edema: Secondary | ICD-10-CM | POA: Diagnosis not present

## 2020-12-01 DIAGNOSIS — G7000 Myasthenia gravis without (acute) exacerbation: Secondary | ICD-10-CM | POA: Diagnosis not present

## 2020-12-01 DIAGNOSIS — I10 Essential (primary) hypertension: Secondary | ICD-10-CM | POA: Diagnosis not present

## 2020-12-01 DIAGNOSIS — M545 Low back pain, unspecified: Secondary | ICD-10-CM

## 2020-12-01 DIAGNOSIS — R7303 Prediabetes: Secondary | ICD-10-CM

## 2020-12-01 DIAGNOSIS — K219 Gastro-esophageal reflux disease without esophagitis: Secondary | ICD-10-CM

## 2020-12-01 LAB — GLUCOSE, CAPILLARY: Glucose-Capillary: 116 mg/dL — ABNORMAL HIGH (ref 70–99)

## 2020-12-01 LAB — POCT GLYCOSYLATED HEMOGLOBIN (HGB A1C): Hemoglobin A1C: 5.7 % — AB (ref 4.0–5.6)

## 2020-12-01 MED ORDER — TRAMADOL HCL 50 MG PO TABS: 50.0000 mg | ORAL_TABLET | Freq: Four times a day (QID) | ORAL | 0 refills | Status: DC | PRN

## 2020-12-01 MED ORDER — OMEPRAZOLE 40 MG PO CPDR: 40.0000 mg | DELAYED_RELEASE_CAPSULE | Freq: Every day | ORAL | 0 refills | Status: DC

## 2020-12-01 MED ORDER — PREGABALIN 50 MG PO CAPS: 50.0000 mg | ORAL_CAPSULE | Freq: Three times a day (TID) | ORAL | 0 refills | Status: DC

## 2020-12-01 NOTE — Patient Instructions (Addendum)
[name], it was a pleasure seeing you today!  Today we discussed: Low Back Pain- You complained of back pain. I ordered your previous prescription of Tramadol 50 mg prn q6hrs due to the back pain. I also added a new medicine called Lyrica 50 mg to take two times a day and after three weeks to increase to three times a day. Follow up to make adjustments.  Hypertension-Your blood pressure is well controlled. Continue current medications.  GERD-Your GERD is well controlled. I refilled your Prilosec.  Myasthenia Gravis-You reported that your myasthenia gravis is well controlled. Please continue taking your medications and follow up with neurology as scheduled.    I have ordered the following labs today:  Lab Orders  Glucose, capillary  POC Hbg A1C       Referrals ordered today:   Referral Orders  No referral(s) requested today     I have ordered the following medication/changed the following medications:   Stop the following medications: Medications Discontinued During This Encounter  Medication Reason   diclofenac Sodium (VOLTAREN) 1 % GEL Change in therapy   omeprazole (PRILOSEC) 40 MG capsule Reorder   traMADol (ULTRAM) 50 MG tablet Reorder     Start the following medications: Meds ordered this encounter  Medications   traMADol (ULTRAM) 50 MG tablet    Sig: Take 1-2 tablets (50-100 mg total) by mouth every 6 (six) hours as needed for moderate pain.    Dispense:  15 tablet    Refill:  0   omeprazole (PRILOSEC) 40 MG capsule    Sig: Take 1 capsule (40 mg total) by mouth daily.    Dispense:  90 capsule    Refill:  0   pregabalin (LYRICA) 50 MG capsule    Sig: Take 1 capsule (50 mg total) by mouth 3 (three) times daily. Start by taking 1 capsule two times a day and after two weeks then increase to 1 capsule three times a day.    Dispense:  90 capsule    Refill:  0     Follow-up: 3 months   Please make sure to arrive 15 minutes prior to your next  appointment. If you arrive late, you may be asked to reschedule.   We look forward to seeing you next time. Please call our clinic at [phone] if you have any questions or concerns. The best time to call is Monday-Friday from 9am-4pm, but there is someone available 24/7. If after hours or the weekend, call the main hospital number and ask for the Internal Medicine Resident On-Call. If you need medication refills, please notify your pharmacy one week in advance and they will send [place] a request.  Thank you for letting us take part in your care. Wishing you the best!  Thank you, Idamae Schuller, MD

## 2020-12-03 ENCOUNTER — Encounter: Payer: Self-pay | Admitting: Internal Medicine

## 2020-12-03 NOTE — Assessment & Plan Note (Addendum)
Assessment Patient has low back pain that he states is chronic. Upon getting more information, the patient got irritated as he said everything was in his chart. He stated the Tramadol works for him and he just needs some more. 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 He does have radiation down the leg. Advised discussing this with neurology as he is following with them already.  Plan: -Refill tramadol 50 mg 15 tablets prn q6hrs -Exercises for back -Lyrica 50 mg TID

## 2020-12-03 NOTE — Assessment & Plan Note (Signed)
Assessment: Patient's has hypertension that is controlled. Today's blood pressure was 154/95. He states he has white coat hypertension, lisinopril. Current medications include Norvasc, . Patient endorsed adherence. Patient denied any associated symptoms.  Plan: -Continue current medications Norvasc 5 mg and Lisinopril 40 mg.

## 2020-12-03 NOTE — Progress Notes (Signed)
CC: "med refills"  HPI:  [name] is a 75 y.o. with medical history as below presenting to [facility] for follow up and medication refill.  Please see problem-based list for further details, assessments, and plans.  Past Medical History:  Diagnosis Date   Acute blood loss anemia    BPH (benign prostatic hyperplasia)    DDD (degenerative disc disease), lumbosacral    Debility    Diverticulitis 05/01/2020   Encephalopathy acute    Endotracheally intubated    Fatty liver    Foley catheter in place    History of gallstones    History of prostatitis    Hypertension    IDA (iron deficiency anemia)    Liver mass 09/17/2001   4.7cm , noted on [place] abd   Low back pain    Myasthenia gravis ([place])    Nocturia    Peritonitis ([place])    Protein-calorie malnutrition, severe 04/06/2018   Renal cyst, 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   1.3 cm simple, noted on [place] ABD   Spondylosis Lumbar   Steroid-induced hyperglycemia    Umbilical hernia    Unintentional weight loss 04/03/2018   Urinary retention 02/08/2018   Weakness of both legs 02/2020   Review of Systems:    Review of Systems  Constitutional:  Negative for chills, fever and weight loss.  HENT:  Negative for ear pain, hearing loss, sinus pain and tinnitus.   Eyes:  Negative for blurred vision, double vision and pain.  Respiratory:  Negative for cough, hemoptysis, sputum production and shortness of breath.   Cardiovascular:  Negative for chest pain, palpitations and orthopnea.  Gastrointestinal:  Negative for abdominal pain, blood in stool, diarrhea, heartburn, melena, nausea and vomiting.  Genitourinary:  Negative for dysuria, frequency and urgency.  Musculoskeletal:  Positive for back pain. Negative for myalgias and neck pain.  Skin:  Negative for itching and rash.  Neurological:  Negative for dizziness, tingling, loss of consciousness, weakness and headaches.  Endo/Heme/Allergies:  Negative for polydipsia. Does not bruise/bleed easily.   Psychiatric/Behavioral:  Negative for depression, substance abuse and suicidal ideas.    Physical Exam:  Vitals:   12/01/20 1353 12/01/20 1404 12/01/20 1526  BP: (!) 154/95 (!) 156/92 (!) 149/77  Pulse: 89 85 70  Temp: 99.8 F (37.7 C)    TempSrc: Oral    SpO2: 98%    Weight: 146 lb 8 oz (66.5 kg)    Height: 5\' 6"  (1.676 m)      Physical Exam Constitutional:      General: He is not in acute distress.    Appearance: Normal appearance. He is not ill-appearing.  HENT:     Head: Normocephalic and atraumatic.     Right Ear: External ear normal.     Left Ear: External ear normal.     Nose: Nose normal.     Mouth/Throat:     Mouth: Mucous membranes are moist.     Pharynx: Oropharynx is clear. No oropharyngeal exudate or posterior oropharyngeal erythema.  Eyes:     Extraocular Movements: Extraocular movements intact.     Conjunctiva/sclera: Conjunctivae normal.     Pupils: Pupils are equal, round, and reactive to light.  Cardiovascular:     Rate and Rhythm: Normal rate and regular rhythm.     Pulses: Normal pulses.     Heart sounds: Normal heart sounds. No murmur heard.   No friction rub.  Pulmonary:     Effort: Pulmonary effort is normal.     Breath  sounds: Normal breath sounds.  Abdominal:     General: Bowel sounds are normal.     Palpations: Abdomen is soft.  Musculoskeletal:        General: No swelling, tenderness, deformity or signs of injury. Normal range of motion.     Cervical back: Normal range of motion and neck supple.     Right lower leg: No edema.     Left lower leg: No edema.  Skin:    Capillary Refill: Capillary refill takes less than 2 seconds.     Coloration: Skin is not jaundiced or pale.     Findings: No erythema, lesion or rash.  Neurological:     General: No focal deficit present.     Mental Status: He is alert and oriented to person, place, and time. Mental status is at baseline.  Psychiatric:        Mood and Affect: Mood normal.         Behavior: Behavior normal.    Assessment & Plan:   See Encounters Tab for problem based charting.  Patient seen with Dr. Jimmye Norman. Idamae Schuller, MD

## 2020-12-03 NOTE — Assessment & Plan Note (Signed)
Assessment Patient has a history of GERD. He denied any associated symptoms. It is controlled with 40 mg daily.  Plan: -Continue current medication -Refill current medication

## 2020-12-03 NOTE — Assessment & Plan Note (Signed)
Assessment: Patient had bilateral ankle edema on last visit. He continued to get it when standing. He tried compression stockings and they provide relief but still has some issue. I talked about medical grade stockings and changing to another agent as he is on Norvasc.  Plan: -He agrees to try different stockings but wants to continue Norvasc due to good response. -Will continue to monitor -Advised leg elevation and preventive measures.

## 2020-12-03 NOTE — Assessment & Plan Note (Signed)
Assessment: Currently well controlled on pyridostigmine 60 mg tablet. Followed by Neurology. Currently denying any symptoms.  Plan: -Continue to follow up with neurology -Continue current medications.

## 2020-12-08 NOTE — Progress Notes (Signed)
Internal Medicine Clinic Attending  I saw and evaluated the patient.  I personally confirmed the key portions of the history and exam documented by Dr. Khan and I reviewed pertinent patient test results.  The assessment, diagnosis, and plan were formulated together and I agree with the documentation in the resident's note.  

## 2020-12-29 ENCOUNTER — Ambulatory Visit (INDEPENDENT_AMBULATORY_CARE_PROVIDER_SITE_OTHER): Payer: Medicare Other | Admitting: Internal Medicine

## 2020-12-29 ENCOUNTER — Telehealth: Payer: Self-pay

## 2020-12-29 ENCOUNTER — Encounter: Payer: Self-pay | Admitting: Internal Medicine

## 2020-12-29 VITALS — BP 117/81 | HR 57 | Temp 98.3°F | Ht 66.0 in | Wt 144.0 lb

## 2020-12-29 DIAGNOSIS — F5101 Primary insomnia: Secondary | ICD-10-CM

## 2020-12-29 DIAGNOSIS — R600 Localized edema: Secondary | ICD-10-CM | POA: Diagnosis not present

## 2020-12-29 DIAGNOSIS — G4700 Insomnia, unspecified: Secondary | ICD-10-CM | POA: Diagnosis present

## 2020-12-29 MED ORDER — RAMELTEON 8 MG PO TABS: 8.0000 mg | ORAL_TABLET | Freq: Every evening | ORAL | 1 refills | Status: DC | PRN

## 2020-12-29 NOTE — Telephone Encounter (Signed)
Walk in for BLE X 10 days, appt given w/ Dr. Court Joy @ [phone]

## 2020-12-29 NOTE — Progress Notes (Signed)
   CC: Insomnia and lower extremity edema  HPI:Danny Walker is a 75 y.o. male who presents for evaluation of insomnia and lower extremity edema. Please see individual problem based A/P for details.   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   DDD (degenerative disc disease), lumbosacral    Debility    Diverticulitis 05/01/2020   Encephalopathy acute    Endotracheally intubated    Fatty liver    Foley catheter in place    Hip pain 09/30/2019   History of gallstones    History of prostatitis    Hypertension    IDA (iron deficiency anemia)    Leg weakness 03/23/2020   Liver mass 09/17/2001   4.7cm , noted on [place] abd   Low back pain    Myasthenia gravis ([place])    Myasthenia gravis with acute exacerbation ([place]) 01/24/2020   Myasthenia gravis, 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   Nocturia    Perforation of sigmoid colon due to diverticulitis    Peritonitis ([place])    Protein-calorie malnutrition, severe 04/06/2018   Renal cyst, 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   1.3 cm simple, noted on [place] ABD   Right shoulder pain 08/20/2019   Spondylosis Lumbar   Steroid-induced hyperglycemia    Superficial bruising of arm, right, initial encounter [id number]   Umbilical hernia    Unintentional weight loss 04/03/2018   Urinary retention 02/08/2018   Visual disturbance 03/23/2020   Weakness of both legs 02/2020   Review of Systems:   Review of Systems  Constitutional:  Negative for chills and fever.  Cardiovascular:  Positive for leg swelling. Negative for chest pain, orthopnea and PND.    Physical Exam: Vitals:   12/29/20 0908  BP: 117/81  Pulse: (!) 57  Temp: 98.3 F (36.8 C)  TempSrc: Oral  SpO2: 98%  Weight: 144 lb (65.3 kg)  Height: '5\' 6"'$  (1.676 m)   General: Centrally obese man with thin dry hair HEENT: Conjunctiva nl , antiicteric  sclerae, proptosis Cardiovascular: Normal rate, regular rhythm.  No murmurs, rubs, or gallops Pulmonary : Equal breath sounds, No wheezes, rales, or rhonchi Abdominal: soft, nontender,  bowel sounds present Ext: Trace lower extremity edema, no pain on palpation of lower extremities.   Assessment & Plan:   See Encounters Tab for problem based charting.  Patient discussed with Dr. Philipp Ovens

## 2020-12-29 NOTE — Patient Instructions (Addendum)
Thank you for trusting me with your care. To recap, today we discussed the following:  1. Insomnia, unspecified type - TSH - ramelteon (ROZEREM) 8 MG tablet; Take 1 tablet (8 mg total) by mouth at bedtime as needed for sleep.  Dispense: 30 tablet; Refill: 1  2. Lower extremity Edema - Stop taking amlodipine - Elevate legs at night

## 2020-12-30 ENCOUNTER — Encounter: Payer: Self-pay | Admitting: Internal Medicine

## 2020-12-30 DIAGNOSIS — G4700 Insomnia, unspecified: Secondary | ICD-10-CM | POA: Insufficient documentation

## 2020-12-30 LAB — TSH: TSH: 3.88 u[IU]/mL (ref 0.450–4.500)

## 2020-12-30 MED ORDER — TRAZODONE HCL 50 MG PO TABS: 50.0000 mg | ORAL_TABLET | Freq: Every day | ORAL | 2 refills | Status: DC

## 2020-12-30 NOTE — Assessment & Plan Note (Signed)
Patient continues to experience bilateral lower extremity edema.  On my exam he has trace edema in ankles and feet. Patient has small legs, however, he notes he has not able to put on his compression stockings.  He has a device to assist with this but still not able to wear compression stockings.  Lower extremity edema improves with elevating his legs.  He continues to take Norvasc 5 mg daily.   Assessment/Plan: Bilateral lower extremity edema, I think this is most likely due to patient's current prednisone use.  He is on amlodipine, will also stop this medication.  His blood pressure is well controlled today and I expect he will still be controlled with stopping this medication.  No orthopnea, no PND, and no shortness of breath.  Reviewed echo results from 2021 TTE,  normal EF with some mild valve abnormalities appropriate for age.

## 2020-12-30 NOTE — Assessment & Plan Note (Signed)
Patient reports difficulty sleeping for the past month. He denies any recent changes to bedtime routine.  Assessment/Plan : Insomnia - TSH - Trazodone 50 mg qhs  Addendum: TSH wnl, patient was sent mychart message after attempting to call using translator.

## 2020-12-31 ENCOUNTER — Other Ambulatory Visit: Payer: Self-pay | Admitting: Internal Medicine

## 2020-12-31 ENCOUNTER — Ambulatory Visit (INDEPENDENT_AMBULATORY_CARE_PROVIDER_SITE_OTHER): Payer: Medicare Other | Admitting: Neurology

## 2020-12-31 ENCOUNTER — Other Ambulatory Visit: Payer: Self-pay

## 2020-12-31 VITALS — BP 133/72 | HR 69 | Ht 63.0 in | Wt 143.0 lb

## 2020-12-31 DIAGNOSIS — G7000 Myasthenia gravis without (acute) exacerbation: Secondary | ICD-10-CM

## 2020-12-31 DIAGNOSIS — M545 Low back pain, unspecified: Secondary | ICD-10-CM

## 2020-12-31 MED ORDER — PREDNISONE 20 MG PO TABS: 30.0000 mg | ORAL_TABLET | Freq: Every day | ORAL | 0 refills | Status: DC

## 2020-12-31 NOTE — Progress Notes (Signed)
Follow-up Visit   Date: 12/31/20   Shafter Keefover MRN: [id number] DOB: 11-Jul-1945   Interim History: Danny Walker is a 75 y.o. Spanish-speaking male from [place] with history of hypertension, prostatitis, liver mass, urinary retention, diverticulitis s/p sigmoid resection returning to the clinic for follow-up of seropositive myasthenia gravis (diagnosed 2019).  The patient was accompanied to the clinic by Spanish interpretor.  History of present illness: Patient was diagnosed with seroposotive myasthenia gravis in 2019 after being admitted to [facility] with dysphagia, dysarthria, diplopia and having robust response to mestinon. He required NG tube, no intubation. CT chest negative for thymoma.  His prednisone was gradually increased to '60mg'$ /d and mestinon '60mg'$  QID. Prednisone has been tapered and he has been on '10mg'$  since August 2020.   Due to leg cramps, his mestinon was reduced to '30mg'$  BID which has helped.  Patient was doing well until July 2021 when he began having bulbar weakness, so prednisone was increased to '40mg'$ /d.    In 2021, he had multiple ER visits, at one visit there was concern for STEMI, but patient left AMA without recommended cardiac work-up.  He returned to ER two days later with dizziness/lightheadedness and suffered a fall in the bathtub.  Orthostatic were positive.  CT head negative and again, no evidence of MG exacerbation.   He complains of weakness in the legs.     UPDATE 12/31/2020:  He is here for follow-up.  He was last seen in the office in November 2021.  Since then he was hospitalized with diverticulitis complicated by septic shock requiring sigmoid resection s/p colostomy followed by reversal.  He has several ER visits in 2022 for bleeding from his surgical site.  He was last seen in November and given a tapering prednisone dose ('40mg'$  > '30mg'$ ) and lost to follow-up since then.  He tells me that he has been taking prednisone '35mg'$ /d and mestinon '30mg'$   three times daily.   Starting two weeks ago, he complains of difficulty swallowing liquids and solids and generalized weakness.  He suffered one fall when he walked outside without using his cane. He also complains of double vision which is intermittent.  He has returned to work at [employer] about a month ago. He AC and ventilation is very poor and he does not seem to have supportive staff.   Medications:  Current Outpatient Medications on File Prior to Visit  Medication Sig Dispense Refill   Calcium Carb-Cholecalciferol (OYSTER SHELL CALCIUM W/D) 500-200 MG-UNIT TABS Take 1 tablet by mouth 2 (two) times daily.     lisinopril (ZESTRIL) 40 MG tablet TOME UNA TABLETA TODOS LOS DIAS (Patient taking differently: Take 40 mg by mouth daily.) 90 tablet 3   naproxen (NAPROSYN) 250 MG tablet TOME UNA TABLETA DOS VECES AL DIA CON ALIMENTO 28 tablet 0   omeprazole (PRILOSEC) 40 MG capsule Take 1 capsule (40 mg total) by mouth daily. 90 capsule 0   oxybutynin (DITROPAN) 5 MG tablet Take 5 mg by mouth 3 (three) times daily.     pregabalin (LYRICA) 50 MG capsule Take 1 capsule (50 mg total) by mouth 3 (three) times daily. Start by taking 1 capsule two times a day and after two weeks then increase to 1 capsule three times a day. 90 capsule 0   pregabalin (LYRICA) 50 MG capsule Take 50 mg by mouth 3 (three) times daily.     pyridostigmine (MESTINON) 60 MG tablet TAKE 1/2 TABLET AT 7AM, 12PM, AND 5PM EACH DAY. (  Patient taking differently: Take 30 mg by mouth 3 (three) times daily.) 135 tablet 4   traMADol (ULTRAM) 50 MG tablet Take by mouth every 6 (six) hours as needed.     ferrous sulfate 325 (65 FE) MG tablet TOME UNA TABLETA TODOS LOS DIAS (Patient not taking: Reported on 12/31/2020) 90 tablet 1   finasteride (PROSCAR) 5 MG tablet TAKE 1 TABLET (5 MG TOTAL) BY MOUTH EVERY MORNING. (Patient not taking: Reported on 12/31/2020) 90 tablet 3   neomycin-polymyxin b-dexamethasone (MAXITROL) 3.5-10000-0.1 OINT Place 1  application into both eyes at bedtime. (Patient not taking: Reported on 12/31/2020)     Propylene Glycol (SYSTANE COMPLETE OP) Place 1 drop into both eyes 3 (three) times daily as needed (dry eyes). (Patient not taking: Reported on 12/31/2020)     traZODone (DESYREL) 50 MG tablet Take 1 tablet (50 mg total) by mouth at bedtime. (Patient not taking: Reported on 12/31/2020) 30 tablet 2   No current facility-administered medications on file prior to visit.    Allergies: No Known Allergies  Vital Signs:  BP 133/72   Pulse 69   Ht '5\' 3"'$  (1.6 m)   Wt 143 lb (64.9 kg)   SpO2 98%   BMI 25.33 kg/m   Neurological Exam: MENTAL STATUS including orientation to time, place, person, recent and remote memory, attention span and concentration, language, and fund of knowledge is normal.  Speech is clear, no dysarthria.   CRANIAL NERVES:  Pupils are round and reactive. Normal conjugate, extra-ocular eye movements in all directions of gaze.  No ptosis at rest or with sustained upgaze. No bulbar weakness.   MOTOR:  Motor strength is 5/5 in all extremities, including neck flexion  REFLEXES:  Reflexes are 2+/4 throughout, except 3+/4 bilateral patella  COORDINATION/GAIT:   Gait wide-based and stable. Unable to stand up without using arms to push off.   Data: MRI brain wo contrast 04/03/2018: 1. Discontinued examination due to patient's cessation of choking. 2. No acute intracranial abnormality. Chronic small vessel ischemia.  CT chest w contrast 04/07/2018: No evidence of anterior mediastinal mass to suggest thymoma. No acute cardiopulmonary disease.  Labs 04/04/2018:  AChR binding 7.86*, blocking 65*, modulating 57*, TSH 1.77, CK 75, HIV neg, RPR neg  Lab Results  Component Value Date   CKTOTAL 51 04/20/2020   Lab Results  Component Value Date   HGBA1C 5.7 (A) 12/01/2020   Lab Results  Component Value Date   [id number] (L) 03/16/2020     IMPRESSION/PLAN: Seropositive bulbar  myasthenia gravis without exacerbation (dx 2019, thymoma negative).  Current symptoms of dysphagia and diplopia are intermittent and most likely due to pseudoexacerbation since he has returned to work and warmer temperature make MG feel worse. His exam does not disclose any bulbar or limb weakness.  - Reduce prednisone to '30mg'$ /d  - Continue mestinon '30mg'$  three times daily.  OK to take an extra dose as needed  - Encouraged to stay in cool temperatures  - If he has true exacerbation, consider [place] going forward  - He had many questions which were answered to the best of my ability.  Significant amount of time was spent reviewing his medications, how he was taking them, and how I want him to start taking them  Return to clinic in 2 months, or sooner as needed  Total time spent reviewing records, interview, history/exam, documentation, and coordination of care on day of encounter:  40 min   Thank you for allowing me to participate  in patient's care.  If I can answer any additional questions, I would be pleased to do so.    Sincerely,    Nichol Ator K. Posey Pronto, DO

## 2020-12-31 NOTE — Telephone Encounter (Signed)
Tramadol refilled at his visit 12/03/20 with Dr. Humphrey Rolls, prescribed 15 tabs as needed for low back pain thought to be secondary to his severe stenosis. He was also started on lyrica at this time and recommended to follow up with his neurologist. He saw his neurologist today but did not see any documentation of this being discussed.  Patient will need follow up appointment to discuss chronic pain management prior to this medication being refilled. Dr. Court Joy saw the patient yesterday and started him on trazodone for sleep, the combination of tramadol and trazodone puts the patient at risk for serotonin syndrome.   If we can please call this patient and inform him he will need an appointment to be seen to discuss his chronic pain prior to this being refilled. Thank you!

## 2020-12-31 NOTE — Patient Instructions (Addendum)
Reduce prednisone to '30mg'$  daily Continue pyridostigmine '30mg'$  three times daily. OK to take extra pyridostigmine '30mg'$  as needed  Return to clinic in 2 months

## 2021-01-05 ENCOUNTER — Encounter: Payer: Self-pay | Admitting: Student

## 2021-01-05 NOTE — Progress Notes (Signed)
Internal Medicine Clinic Attending  Case discussed with Dr. Steen  At the time of the visit.  We reviewed the resident's history and exam and pertinent patient test results.  I agree with the assessment, diagnosis, and plan of care documented in the resident's note.  

## 2021-01-08 ENCOUNTER — Other Ambulatory Visit: Payer: Self-pay | Admitting: Neurology

## 2021-01-08 NOTE — Telephone Encounter (Addendum)
Received call from [place] at [facility] requesting refill on tramadol with increased qty and additional refills. States patient frequently comes to Pharmacy for this 2/2 back, leg, and arm pain.  Also, states Ramelteon needs PA. Per OV note on 8/9 both ramelteon and trazadone are listed but only trazadone is on med list. Please advise.  Call placed to patient via [facility] [place]. First number listed is for patient's stepdaughter. She will relay message to patient to call our office to schedule appt. Attempted to reach patient at second number, however, recording immediately picked up stating no VMB has been set up.

## 2021-01-12 ENCOUNTER — Other Ambulatory Visit: Payer: Self-pay | Admitting: Internal Medicine

## 2021-01-14 ENCOUNTER — Encounter: Payer: Self-pay | Admitting: Internal Medicine

## 2021-01-14 ENCOUNTER — Other Ambulatory Visit: Payer: Self-pay

## 2021-01-14 ENCOUNTER — Ambulatory Visit (INDEPENDENT_AMBULATORY_CARE_PROVIDER_SITE_OTHER): Payer: Medicare Other | Admitting: Internal Medicine

## 2021-01-14 VITALS — BP 154/85 | HR 73 | Temp 98.5°F | Resp 24 | Ht 63.0 in | Wt 152.3 lb

## 2021-01-14 DIAGNOSIS — R600 Localized edema: Secondary | ICD-10-CM

## 2021-01-14 DIAGNOSIS — M545 Low back pain, unspecified: Secondary | ICD-10-CM

## 2021-01-14 DIAGNOSIS — M48061 Spinal stenosis, lumbar region without neurogenic claudication: Secondary | ICD-10-CM

## 2021-01-14 DIAGNOSIS — M4807 Spinal stenosis, lumbosacral region: Secondary | ICD-10-CM

## 2021-01-14 DIAGNOSIS — I10 Essential (primary) hypertension: Secondary | ICD-10-CM | POA: Diagnosis not present

## 2021-01-14 LAB — BRAIN NATRIURETIC PEPTIDE: B Natriuretic Peptide: 89.9 pg/mL (ref 0.0–100.0)

## 2021-01-14 MED ORDER — TRAMADOL HCL 50 MG PO TABS: 50.0000 mg | ORAL_TABLET | Freq: Four times a day (QID) | ORAL | 3 refills | Status: DC | PRN

## 2021-01-14 MED ORDER — LISINOPRIL-HYDROCHLOROTHIAZIDE 20-12.5 MG PO TABS: 1.0000 | ORAL_TABLET | Freq: Every day | ORAL | 11 refills | Status: DC

## 2021-01-14 NOTE — Progress Notes (Signed)
   CC: follow-up on back pain and HTN  HPI:  [name] is a 75 y.o. with medical history as below presenting to [facility] for follow-up on back pain and HTN.  Please see problem-based list for further details, assessments, and plans.  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   DDD (degenerative disc disease), lumbosacral    Debility    Diverticulitis 05/01/2020   Encephalopathy acute    Endotracheally intubated    Fatty liver    Foley catheter in place    Hip pain 09/30/2019   History of gallstones    History of prostatitis    Hypertension    IDA (iron deficiency anemia)    Leg weakness 03/23/2020   Liver mass 09/17/2001   4.7cm , noted on [place] abd   Low back pain    Myasthenia gravis ([place])    Myasthenia gravis with acute exacerbation ([place]) 01/24/2020   Myasthenia gravis, bulbar ([place]) 04/06/2018   Nocturia    Perforation of sigmoid colon due to diverticulitis    Peritonitis ([place])    Protein-calorie malnutrition, severe 04/06/2018   Renal cyst, 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  Endorses constipation relieved with miralax  Physical Exam:  Vitals:   01/14/21 1320  BP: (!) 154/85  Pulse: 73  Resp: (!) 24  Temp: 98.5 F (36.9 C)  TempSrc: Oral  SpO2: 98%  Weight: 152 lb 4.8 oz (69.1 kg)  Height: '5\' 3"'$  (1.6 m)    General: well-developed,  well-nourished, ambulates with cane HENT:NCAT, no scars noted Eyes:no scleral icterus, conjunctiva clear CV:No murmurs, rubs, or gallops.  No bruits or murmurs appreciated at carotids Pulm: CTAB, normal pulmonary effort GI:no tenderness present, bowel sounds normal MSK: 1+ peripheral edema extending to mid shin, bilateral and symmetric Skin: warm and dry, normal skin turgor Psych: normal mood and affect  Assessment & Plan:   See Encounters Tab for problem based charting.  Patient seen with Dr. Dareen Piano

## 2021-01-14 NOTE — Assessment & Plan Note (Signed)
Patient presents with continued back pain.  He states that he takes tramodol with some relief.  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  He has been to PT in the past and found this helpful.  Over the past month he has noticed increased weakness in his legs and uses a cane to ambulate.  He denies numbness, loss of bowel or bladder function.   Plan: -referral to PT -refilled tramodol -will consider referral to PM&R vs ortho if pain worsens

## 2021-01-14 NOTE — Patient Instructions (Signed)
[name] Letter, it was a pleasure seeing you today!  Today we discussed: Blood pressure- I sent in a medication that is two medications in one pill.  One is Lisinopril and the other is a medication taht should help with the swelling you have in your legs.  Stop taking the lisinopril you have.  I would like you to go over your medications with our pharmacist here.  They will be in contact with you for that appointment. Swelling- I've ordered some blood work to further evaluate what might be causing your swelling in your legs.  I will call you when those results are back. Back pain- I've refilled your tramodol and referred you for PT.    I have ordered the following labs today:   Lab Orders         BMP8+Anion Gap         Brain natriuretic peptide         Protein / Creatinine Ratio, Urine      Tests ordered today:  BMP, BNP, Protein/creatinine ratio urine  Referrals ordered today:    Referral Orders         Ambulatory referral to Physical Therapy      I have ordered the following medication/changed the following medications:   Stop the following medications: Medications Discontinued During This Encounter  Medication Reason   lisinopril (ZESTRIL) 40 MG tablet Change in therapy   traMADol (ULTRAM) 50 MG tablet Reorder     Start the following medications: Meds ordered this encounter  Medications   lisinopril-hydrochlorothiazide (ZESTORETIC) 20-12.5 MG tablet    Sig: Take 1 tablet by mouth daily.    Dispense:  30 tablet    Refill:  11   traMADol (ULTRAM) 50 MG tablet    Sig: Take 1 tablet (50 mg total) by mouth every 6 (six) hours as needed.    Dispense:  30 tablet    Refill:  3     Follow-up:  1 month    Please make sure to arrive 15 minutes prior to your next appointment. If you arrive late, you may be asked to reschedule.   We look forward to seeing you next time. Please call our clinic at [phone] if you have any questions or concerns. The best time to call  is Monday-Friday from 9am-4pm, but there is someone available 24/7. If after hours or the weekend, call the main hospital number and ask for the Internal Medicine Resident On-Call. If you need medication refills, please notify your pharmacy one week in advance and they will send [place] a request.  Thank you for letting us take part in your care. Wishing you the best!  Thank you, Dr. Hel[facility]

## 2021-01-14 NOTE — Assessment & Plan Note (Addendum)
Patient presents with continued bilateral lower extremity edema.  He stopped taking Norvasc and has not seen a change in his edema in his legs.  Denies shortness of breath.  He wears compression stockings and tries to have his feet up as much as possible.  Echo results from 2021 TTE, normal EF with some mild valve abnormalities appropriate for age.  Edema is 1+, bilateral, symmetric and extends to mid shin.   Plan: -ordered BMP, BNP, and protein/creatinine ratio urine  ADDENDUM 01/18/21 BNP wnl BMP with creatinine clearance of 96m/min Protein/creatinine ratio elevated.  Called patient using interpreter service and left voicemail.  Will repeat protein/ creatinine ratio in one month to confirm.  This could be due to prolonged hypertension.  Will also consider CMP for kidney function and albumin.

## 2021-01-14 NOTE — Assessment & Plan Note (Signed)
Patient presents today with history of HTN.  His medications were recently changed from Lisinopril 40 mg and Norvasc 5 mg to Lisinopril 40 mg due to peripheral edema.  His blood pressure is 154/85 in clinic.  He states that at home his blood pressure is usually around 125/100.  Asked patient to bring in medications and blood pressure log at next visit.  Will changed medication to lisinopril-HCTZ 20-12.5 mg.  Hopefully HCTZ will also help with edema. Plan: -start lisinopril- HCTZ 20-12.5 mg  -BMP -referral to pharmacy for medication adherence

## 2021-01-15 LAB — BMP8+ANION GAP
Anion Gap: 16 mmol/L (ref 10.0–18.0)
BUN/Creatinine Ratio: 28 — ABNORMAL HIGH (ref 10–24)
BUN: 25 mg/dL (ref 8–27)
CO2: 22 mmol/L (ref 20–29)
Calcium: 9 mg/dL (ref 8.6–10.2)
Chloride: 106 mmol/L (ref 96–106)
Creatinine, Ser: 0.88 mg/dL (ref 0.76–1.27)
Glucose: 112 mg/dL — ABNORMAL HIGH (ref 65–99)
Potassium: 4.2 mmol/L (ref 3.5–5.2)
Sodium: 144 mmol/L (ref 134–144)
eGFR: 90 mL/min/{1.73_m2} (ref >59)

## 2021-01-15 LAB — PROTEIN / CREATININE RATIO, URINE
Creatinine, Urine: 52.9 mg/dL
Protein, Ur: 24.3 mg/dL
Protein/Creat Ratio: 459 mg/g creat — ABNORMAL HIGH (ref 0–200)

## 2021-01-18 NOTE — Progress Notes (Signed)
Internal Medicine Clinic Attending  I saw and evaluated the patient.  I personally confirmed the key portions of the history and exam documented by Dr. Masters and I reviewed pertinent patient test results.  The assessment, diagnosis, and plan were formulated together and I agree with the documentation in the resident's note.  

## 2021-01-20 ENCOUNTER — Encounter: Payer: Self-pay | Admitting: Internal Medicine

## 2021-01-22 ENCOUNTER — Other Ambulatory Visit: Payer: Self-pay | Admitting: Internal Medicine

## 2021-01-24 ENCOUNTER — Emergency Department (HOSPITAL_COMMUNITY): Payer: Medicare Other

## 2021-01-24 ENCOUNTER — Other Ambulatory Visit: Payer: Self-pay

## 2021-01-24 ENCOUNTER — Emergency Department (HOSPITAL_COMMUNITY)
Admission: EM | Admit: 2021-01-24 | Discharge: 2021-01-24 | Disposition: A | Discharge status: Home / Self Care | Payer: Medicare Other | Attending: Emergency Medicine | Admitting: Emergency Medicine

## 2021-01-24 ENCOUNTER — Encounter (HOSPITAL_COMMUNITY): Payer: Self-pay | Admitting: Emergency Medicine

## 2021-01-24 DIAGNOSIS — R1030 Lower abdominal pain, unspecified: Secondary | ICD-10-CM | POA: Diagnosis present

## 2021-01-24 LAB — COMPREHENSIVE METABOLIC PANEL
ALT: 24 U/L (ref 0–44)
AST: 25 U/L (ref 15–41)
Albumin: 3.2 g/dL — ABNORMAL LOW (ref 3.5–5.0)
Alkaline Phosphatase: 66 U/L (ref 38–126)
Anion gap: 10 (ref 5–15)
BUN: 22 mg/dL (ref 8–23)
CO2: 25 mmol/L (ref 22–32)
Calcium: 9.4 mg/dL (ref 8.9–10.3)
Chloride: 104 mmol/L (ref 98–111)
Creatinine, Ser: 1.17 mg/dL (ref 0.61–1.24)
GFR, Estimated: >60 mL/min (ref >60)
Glucose, Bld: 132 mg/dL — ABNORMAL HIGH (ref 70–99)
Potassium: 3.5 mmol/L (ref 3.5–5.1)
Sodium: 139 mmol/L (ref 135–145)
Total Bilirubin: 0.7 mg/dL (ref 0.3–1.2)
Total Protein: 5.9 g/dL — ABNORMAL LOW (ref 6.5–8.1)

## 2021-01-24 LAB — URINALYSIS, ROUTINE W REFLEX MICROSCOPIC
Bilirubin Urine: NEGATIVE
Glucose, UA: NEGATIVE mg/dL
Ketones, ur: NEGATIVE mg/dL
Nitrite: POSITIVE — AB
Protein, ur: NEGATIVE mg/dL
Specific Gravity, Urine: 1.01 (ref 1.005–1.030)
pH: 8 (ref 5.0–8.0)

## 2021-01-24 LAB — CBC
HCT: 36.9 % — ABNORMAL LOW (ref 39.0–52.0)
Hemoglobin: 11.1 g/dL — ABNORMAL LOW (ref 13.0–17.0)
MCH: 26.7 pg (ref 26.0–34.0)
MCHC: 30.1 g/dL (ref 30.0–36.0)
MCV: 88.9 fL (ref 80.0–100.0)
Platelets: 337 10*3/uL (ref 150–400)
RBC: 4.15 MIL/uL — ABNORMAL LOW (ref 4.22–5.81)
RDW: 17.4 % — ABNORMAL HIGH (ref 11.5–15.5)
WBC: 10.1 10*3/uL (ref 4.0–10.5)
nRBC: 0 % (ref 0.0–0.2)

## 2021-01-24 LAB — LIPASE, BLOOD: Lipase: 53 U/L — ABNORMAL HIGH (ref 11–51)

## 2021-01-24 MED ORDER — IOHEXOL 350 MG/ML SOLN
75.0000 mL | Freq: Once | INTRAVENOUS | Status: AC | PRN
  Administered 2021-01-24: 75 mL via INTRAVENOUS

## 2021-01-24 NOTE — ED Triage Notes (Signed)
Pt here from home for lower abd pain that started this AM, pt reports difficulty walking and weakness. Hx diverticulitis, had a colostomy that has been reversed. Pt speaks only spanish. VSS

## 2021-01-24 NOTE — ED Notes (Signed)
Bladder scan 210m

## 2021-01-24 NOTE — ED Notes (Signed)
Via Optometrist, RN apologized for delay and de-esclated pt.  He changed his mind and was agreeable to stay for further tests.  RN started IV and thoroughly explained further tests.

## 2021-01-24 NOTE — Discharge Instructions (Addendum)
You came to the emergency department today to be evaluated for your abdominal pain.  Your physical exam and lab results were reassuring.  The CT scan showed no complications within your abdomen or pelvis.  Please follow-up with your primary care provider.  The CT scan obtained showed degenerative changes to your lumbar spine with with multilevel neuroforaminal narrowing.  Due to this I have given you information to follow-up with [place] [facility].  Please call their office on Tuesday to schedule a follow-up appointment.  Get help right away if: Your pain does not go away as soon as your health care provider told you to expect. You cannot stop vomiting. Your pain is only in areas of the abdomen, such as the right side or the left lower portion of the abdomen. Pain on the right side could be caused by appendicitis. You have bloody or black stools, or stools that look like tar. You have severe pain, cramping, or bloating in your abdomen. You have signs of dehydration, such as: Dark urine, very little urine, or no urine. Cracked lips. Dry mouth. Sunken eyes. Sleepiness. Weakness. You have trouble breathing or chest pain.

## 2021-01-24 NOTE — ED Notes (Signed)
Pt to CT

## 2021-01-24 NOTE — ED Provider Notes (Signed)
[facility] EMERGENCY DEPARTMENT Provider Note   CSN: [id number] Arrival date & time: 01/24/21  1452     History Chief Complaint  Patient presents with   Abdominal Pain    Danny Walker is a 75 y.o. male presents with lower abdominal pain.  Patient reports that pain has been constant over the last 2 to 3 days.  Patient rates pain 4/10 on the pain scale.  Patient describes pain as a "discomfort."  No aggravating or alleviating factors.  Patient reports that over the last 2 to 3 days he has been defecating "right after eating."  Patient reports that this made him nervous and come to the emergency department for further evaluation.  Patient denies any fever, chills, nausea, vomiting, constipation, diarrhea, blood in stool, melena, dysuria, urinary frequency, hematuria, penile discharge, swelling or tenderness to genitals.  Per chart review patient had colostomy reversal performed 08/2020, performed by surgeon Dr. Georgette Dover.  Spanish interpreter was used to conduct this interview.   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   DDD (degenerative disc disease), lumbosacral    Debility    Diverticulitis 05/01/2020   Encephalopathy acute    Endotracheally intubated    Fatty liver    Foley catheter in place    Hip pain 09/30/2019   History of gallstones    History of prostatitis    Hypertension    IDA (iron deficiency anemia)    Leg weakness 03/23/2020   Liver mass 09/17/2001   4.7cm , noted on [place] abd   Low back pain    Myasthenia gravis ([place])    Myasthenia gravis with acute exacerbation ([place]) 01/24/2020   Myasthenia  gravis, 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   Nocturia    Perforation of sigmoid colon due to diverticulitis    Peritonitis ([place])    Protein-calorie malnutrition, severe 04/06/2018   Renal cyst, 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   1.3 cm simple, noted on [place] ABD   Right shoulder pain 08/20/2019   Spondylosis Lumbar   Steroid-induced hyperglycemia    Superficial bruising of arm, right, initial encounter [id number]   Umbilical hernia    Unintentional weight loss 04/03/2018   Urinary retention 02/08/2018   Visual disturbance 03/23/2020   Weakness of both legs 02/2020    Patient Active Problem List   Diagnosis Date Noted   Insomnia 12/30/2020   Low back pain 12/01/2020   Bilateral leg edema 06/30/2020   Normocytic anemia 03/10/2020   Myasthenia gravis ([place]) 03/02/2020   Health care maintenance 12/25/2018   Gastroesophageal reflux disease 04/18/2018   Essential hypertension 04/18/2018   BPH (benign prostatic hyperplasia) 03/05/2018    Past Surgical History:  Procedure Laterality Date   BOWEL RESECTION N/A 08/26/2020   Procedure: SMALL BOWEL RESECTION;  Surgeon: Donnie Mesa, MD;  Location: [place];  Service: General;  Laterality: N/A;   COLON RESECTION SIGMOID N/A 05/01/2020   Procedure: COLON RESECTION SIGMOID;  Surgeon: Donnie Mesa, MD;  Location: [place];  Service: General;  Laterality: N/A;   COLOSTOMY N/A 05/01/2020   Procedure: COLOSTOMY;  Surgeon: Donnie Mesa, MD;  Location: [place];  Service: General;  Laterality: N/A;   COLOSTOMY REVERSAL N/A 08/26/2020   Procedure: COLOSTOMY REVERSAL;  Surgeon: Donnie Mesa, MD;  Location: [place];  Service: General;  Laterality: N/A;   ERCP W/ SPHICTEROTOMY  09/17/2001   INSERTION OF MESH N/A 02/05/2016   Procedure: INSERTION OF MESH, UMBILICAL HERNIA;  Surgeon: Armandina Gemma, MD;  Location: [place];  Service: General;  Laterality: N/A;   LAPAROSCOPIC CHOLECYSTECTOMY  09/22/2001   LAPAROTOMY N/A 05/01/2020   Procedure: EXPLORATORY LAPAROTOMY;  Surgeon:  Donnie Mesa, MD;  Location: [place];  Service: General;  Laterality: N/A;   TRANSURETHRAL RESECTION OF PROSTATE N/A 03/05/2018   Procedure: TRANSURETHRAL RESECTION OF THE PROSTATE (TURP);  Surgeon: Cleon Gustin, MD;  Location: [facility];  Service: Urology;  Laterality: N/A;   UMBILICAL HERNIA REPAIR N/A 02/05/2016   Procedure: UMBILICAL HERNIA REPAIR WITH MESH PATCH;  Surgeon: Armandina Gemma, MD;  Location: [place];  Service: General;  Laterality: N/A;       Family History  Problem Relation Age of Onset   Hypertension Mother    Hypertension Father     Social History   Tobacco Use   Smoking status: Never   Smokeless tobacco: Never  Vaping Use   Vaping Use: Never used  Substance Use Topics   Alcohol use: No   Drug use: Never    Home Medications Prior to Admission medications   Medication Sig Start Date End Date Taking? Authorizing Provider  Calcium Carb-Cholecalciferol (OYSTER SHELL CALCIUM W/D) 500-200 MG-UNIT TABS TOME UNA TABLETA DOS VECES AL DIA 01/08/21   Patel, Donika K, DO  ferrous sulfate 325 (65 FE) MG tablet TOME UNA TABLETA TODOS LOS DIAS Patient not taking: Reported on 12/31/2020 08/03/20   Seawell, Jaimie A, DO  finasteride (PROSCAR) 5 MG tablet TAKE 1 TABLET (5 MG TOTAL) BY MOUTH EVERY MORNING. Patient not taking: Reported on 12/31/2020 05/18/20 05/18/21  Angiulli, Lavon Paganini, PA-C  lisinopril-hydrochlorothiazide (ZESTORETIC) 20-12.5 MG tablet Take 1 tablet by mouth daily. 01/14/21 01/14/22  Masters, Katie, DO  naproxen (NAPROSYN) 250 MG tablet TOME UNA TABLETA DOS VECES AL DIA CON ALIMENTO 09/16/20   Lacinda Axon, MD  neomycin-polymyxin b-dexamethasone (MAXITROL) 3.5-10000-0.1 OINT Place 1 application into both eyes at bedtime. Patient not taking: Reported on 12/31/2020    [provider]  omeprazole (PRILOSEC) 40 MG capsule Take 1 capsule (40 mg total) by mouth daily. 12/01/20   Idamae Schuller, MD  oxybutynin (DITROPAN) 5  MG tablet Take 5 mg by mouth 3 (three) times daily.    [provider]  predniSONE (DELTASONE) 20 MG tablet Take 1.5 tablets (30 mg total) by mouth daily. 12/31/20 03/31/21  Narda Amber K, DO  pregabalin (LYRICA) 50 MG capsule Take 1 capsule (50 mg total) by mouth 3 (three) times daily. Start by taking 1 capsule two times a day and after two weeks then increase to 1 capsule three times a day. 12/01/20 03/01/21  Idamae Schuller, MD  pregabalin (LYRICA) 50 MG capsule Take 50 mg by mouth 3 (three) times daily.    [provider]  Propylene Glycol (SYSTANE COMPLETE OP) Place 1 drop into both eyes 3 (three) times daily as needed (dry eyes). Patient not taking: Reported on 12/31/2020    [provider]  pyridostigmine (MESTINON) 60 MG tablet TAKE 1/2 TABLET AT 7AM, 12PM, AND 5PM EACH DAY. Patient taking differently: Take 30 mg by mouth 3 (three) times daily. 06/15/20   Narda Amber K, DO  traMADol (  ULTRAM) 50 MG tablet Take 1 tablet (50 mg total) by mouth every 6 (six) hours as needed. 01/14/21   Masters, Katie, DO  traZODone (DESYREL) 50 MG tablet TOME UNA TABLETA TODOS LOS DIAS AL ACOSTARSE 01/23/21   Sanjuan Dame, MD    Allergies    Patient has no known allergies.  Review of Systems   Review of Systems  Constitutional:  Negative for chills and fever.  Eyes:  Negative for visual disturbance.  Respiratory:  Negative for shortness of breath.   Cardiovascular:  Negative for chest pain.  Gastrointestinal:  Positive for abdominal pain. Negative for abdominal distention, anal bleeding, blood in stool, constipation, diarrhea, nausea, rectal pain and vomiting.  Genitourinary:  Negative for difficulty urinating, dysuria, flank pain, frequency, genital sores, hematuria, penile discharge, penile pain, penile swelling, scrotal swelling and testicular pain.  Musculoskeletal:  Negative for back pain and neck pain.  Skin:  Negative for color change and rash.  Neurological:  Negative for  dizziness, syncope, light-headedness and headaches.  Psychiatric/Behavioral:  Negative for confusion.    Physical Exam Updated Vital Signs BP 120/78   Pulse 69   Temp 98 F (36.7 C)   Resp 15   SpO2 98%   Physical Exam Vitals and nursing note reviewed.  Constitutional:      General: He is not in acute distress.    Appearance: He is not ill-appearing, toxic-appearing or diaphoretic.  HENT:     Head: Normocephalic.  Eyes:     General: No scleral icterus.       Right eye: No discharge.        Left eye: No discharge.  Cardiovascular:     Rate and Rhythm: Normal rate.  Pulmonary:     Effort: Pulmonary effort is normal.  Abdominal:     General: A surgical scar is present. Bowel sounds are normal. There is no distension. There are no signs of injury.     Palpations: Abdomen is soft. There is no mass or pulsatile mass.     Tenderness: There is abdominal tenderness in the right lower quadrant and left lower quadrant. There is no right CVA tenderness, left CVA tenderness, guarding or rebound.     Hernia: There is no hernia in the umbilical area or ventral area.     Comments: Patient has midline surgical scar, small wound at base of surgical scar.  Skin:    General: Skin is warm and dry.  Neurological:     General: No focal deficit present.     Mental Status: He is alert.  Psychiatric:        Behavior: Behavior is cooperative.    ED Results / Procedures / Treatments   Labs (all labs ordered are listed, but only abnormal results are displayed) Labs Reviewed  LIPASE, BLOOD - Abnormal; Notable for the following components:      Result Value   Lipase 53 (*)    All other components within normal limits  COMPREHENSIVE METABOLIC PANEL - Abnormal; Notable for the following components:   Glucose, Bld 132 (*)    Total Protein 5.9 (*)    Albumin 3.2 (*)    All other components within normal limits  CBC - Abnormal; Notable for the following components:   RBC 4.15 (*)    Hemoglobin  11.1 (*)    HCT 36.9 (*)    RDW 17.4 (*)    All other components within normal limits  URINALYSIS, ROUTINE W REFLEX MICROSCOPIC - Abnormal; Notable for the  following components:   Hgb urine dipstick SMALL (*)    Nitrite POSITIVE (*)    Leukocytes,Ua TRACE (*)    Bacteria, UA RARE (*)    All other components within normal limits    EKG None  Radiology CT ABDOMEN PELVIS W CONTRAST  Result Date: 01/24/2021 CLINICAL DATA:  Diverticulitis suspected. Lower abdominal pain, gait abnormality, generalized weakness. EXAM: CT ABDOMEN AND PELVIS WITH CONTRAST TECHNIQUE: Multidetector CT imaging of the abdomen and pelvis was performed using the standard protocol following bolus administration of intravenous contrast. CONTRAST:  7m OMNIPAQUE IOHEXOL 350 MG/ML SOLN COMPARISON:  08/14/2020 FINDINGS: Lower chest: Stable mild elevation of the left hemidiaphragm. The visualized lung bases are clear. Cardiac size within normal limits. No pericardial effusion. Hepatobiliary: Status post cholecystectomy. No intra or extrahepatic biliary ductal dilation. Stable 16 mm hypodensity within the right hepatic lobe adjacent to the gallbladder fossa likely representing a small hemangioma. The liver is otherwise unremarkable. Pancreas: Unremarkable Spleen: Unremarkable Adrenals/Urinary Tract: The adrenal glands are unremarkable. The kidneys are normal in size and position. Simple cortical cyst noted within the a lower pole of the right kidney. The kidneys are otherwise unremarkable. The bladder is unremarkable. Stomach/Bowel: Surgical anastomotic staple lines are seen within the mid small bowel and distal sigmoid colon in keeping with history of a sigmoid colectomy, diverting colostomy, and subsequent takedown. The stomach, small bowel, and large bowel are otherwise unremarkable. The appendix is diminutive, but is unremarkable. No evidence of obstruction or focal inflammation. No free intraperitoneal gas or fluid.  Vascular/Lymphatic: Moderate aortoiliac atherosclerotic calcification. No aortic aneurysm. No pathologic adenopathy within the abdomen and pelvis. Reproductive: The prostate gland is moderately enlarged. Defect within the central gland likely relates to prior trans urethral resection. Seminal vesicles are unremarkable. Other: There is diastasis of the rectus abdominus musculature in marked attenuation of the subcutaneous fat anterior to the ventral abdominal fascia. No discrete hernia identified. The rectum is unremarkable. Musculoskeletal: No acute bone abnormality. Moderate lumbar dextroscoliosis, apex right at L2. Degenerative changes are seen throughout the lumbar spine with resultant marked right neuroforaminal narrowing at L4-5 and L5-S1. No lytic or blastic bone lesions are identified. IMPRESSION: No acute intra-abdominal pathology identified. No definite radiographic explanation for the patient's reported symptoms. Incidental findings as noted above. Degenerative changes noted throughout the lumbar spine with superimposed lumbar dextroscoliosis with multilevel neuroforaminal narrowing identified, most severe on the right at L4-5 and L5-S1. Aortic Atherosclerosis (ICD10-I70.0). Electronically Signed   By: AFidela SalisburyM.D.   On: 01/24/2021 20:32    Procedures Procedures   Medications Ordered in ED Medications - No data to display  ED Course  I have reviewed the triage vital signs and the nursing notes.  Pertinent labs & imaging results that were available during my care of the patient were reviewed by me and considered in my medical decision making (see chart for details).    MDM Rules/Calculators/A&P                           Alert 75year old male in no acute distress, nontoxic appearing.  Presents with chief complaint of lower abdominal pain.  On exam abdomen soft, nondistended, nontender, tenderness to lower abdomen, no guarding or rebound tenderness.  Tenderness to left lower  abdomen greater than right.  Abdominal work-up labs ordered while patient was in triage. CMP is unremarkable CBC shows anemia, appears baseline for patient. Lipase slightly elevated 53; no epigastric tenderness, low suspicion for  acute pancreatitis Urinalysis shows bacteria rare, leukocyte trace, nitrite positive; patient has no urinary symptoms low suspicion for UTI at this time.  Urine culture pending  Due to patient's left lower quadrant abdominal pain concern for possible diverticulitis.  Patient also has history of colostomy, colostomy reversal, and IBS, perforations.  CT abdomen pelvis shows no acute intra-abdominal pathology.  Degenerative changes noted throughout lumbar spine with superimposed lumbar dextroscoliosis with multilevel neuroforaminal narrowing identified  On serial reexamination patient's abdomen is soft, not tender.  Patient hemodynamically stable.  Patient follow-up with his primary care provider.  Patient to follow-up with [place] [facility] for further management of lumbar degenerative changes.  Discussed results, findings, treatment and follow up. Patient advised of return precautions. Patient verbalized understanding and agreed with plan.  Patient care and treatment were discussed with attending physician Dr. Kathrynn Humble.  Final Clinical Impression(s) / ED Diagnoses Final diagnoses:  Lower abdominal pain    Rx / DC Orders ED Discharge Orders     None        Dyann Ruddle 01/24/21 2155    Varney Biles, MD 01/28/21 [phone]

## 2021-01-24 NOTE — ED Provider Notes (Signed)
Emergency Medicine Provider Triage Evaluation Note  Danny Walker , a 75 y.o. male  was evaluated in triage.  Pt complains of lower abdominal pain, relates to prior abdominal surgery, difficulty emptying bladder.  Review of Systems  Positive: Abdominal pain Negative: vomiting  Physical Exam  BP 101/61 (BP Location: Left Arm)   Pulse 83   Temp 98 F (36.7 C)   Resp 20   SpO2 100%  Gen:   Awake, appears uncomfortable  Resp:  Normal effort  MSK:   Moves extremities without difficulty  Other:  Lower abdominal tenderness  Medical Decision Making  Medically screening exam initiated at 3:08 PM.  Appropriate orders placed.  Danny Walker was informed that the remainder of the evaluation will be completed by another provider, this initial triage assessment does not replace that evaluation, and the importance of remaining in the ED until their evaluation is complete.     Tacy Learn, PA-C 01/24/21 [place], Ankit, MD 01/24/21 2156

## 2021-01-27 LAB — URINE CULTURE: Culture: >=100000 — AB

## 2021-01-28 ENCOUNTER — Telehealth: Payer: Self-pay | Admitting: *Deleted

## 2021-01-28 NOTE — Telephone Encounter (Signed)
Post ED Visit - Positive Culture Follow-up: Unsuccessful Patient Follow-up  Culture assessed and recommendations reviewed by:  '[]'$  Elenor Quinones, Pharm.D. '[]'$  Heide Guile, Pharm.D., BCPS AQ-ID '[]'$  Parks Neptune, Pharm.D., BCPS '[]'$  Alycia Rossetti, Pharm.D., BCPS '[]'$  [place], [place].D., BCPS, AAHIVP '[]'$  Legrand Como, Pharm.D., BCPS, AAHIVP '[]'$  Wynell Balloon, PharmD '[]'$  Vincenza Hews, PharmD, BCPS  Positive urine culture  '[x]'$  Patient discharged without antimicrobial prescription and treatment is now indicated '[]'$  Organism is resistant to prescribed ED discharge antimicrobial '[]'$  Patient with positive blood cultures   Plan:  If asymptomatic, do not treat.  If sympptomatic start Keflex '500mg'$  PO BID x 7 days, Dr. Maryan Rued  Unable to contact patient after 3 attempts, letter will be sent to address on file  Ardeen Fillers 01/28/2021, 10:39 AM

## 2021-01-28 NOTE — Progress Notes (Signed)
ED Antimicrobial Stewardship Positive Culture Follow Up   Danny Walker is an 75 y.o. male who presented to [facility] on 01/24/2021 with a chief complaint of  Chief Complaint  Patient presents with   Abdominal Pain    Recent Results (from the past 720 hour(s))  Urine Culture     Status: Abnormal   Collection Time: 01/24/21  9:56 PM   Specimen: Urine, Clean Catch  Result Value Ref Range Status   Specimen Description URINE, CLEAN CATCH  Final   Special Requests   Final    NONE Performed at [facility], [address], [place], [place] [address]    Culture >=100,000 COLONIES/mL KLEBSIELLA OXYTOCA (A)  Final   Report Status 01/27/2021 FINAL  Final   Organism ID, Bacteria KLEBSIELLA OXYTOCA (A)  Final      Susceptibility   Klebsiella oxytoca - MIC*    AMPICILLIN RESISTANT Resistant     CEFAZOLIN 16 SENSITIVE Sensitive     CEFEPIME <=0.12 SENSITIVE Sensitive     CEFTRIAXONE <=0.25 SENSITIVE Sensitive     CIPROFLOXACIN <=0.25 SENSITIVE Sensitive     GENTAMICIN <=1 SENSITIVE Sensitive     IMIPENEM <=0.25 SENSITIVE Sensitive     NITROFURANTOIN <=16 SENSITIVE Sensitive     TRIMETH/SULFA <=20 SENSITIVE Sensitive     AMPICILLIN/SULBACTAM 4 SENSITIVE Sensitive     PIP/TAZO <=4 SENSITIVE Sensitive     * >=100,000 COLONIES/mL KLEBSIELLA OXYTOCA    Plan: Sx check, if no urinary sx do not treat, otherwise Keflex x 7d  ED Provider: Purcell Mouton MD   [name] 01/28/2021, 11:15 AM Clinical Pharmacist Monday - Friday phone -  [phone] Saturday - Sunday phone - [phone]

## 2021-02-01 ENCOUNTER — Other Ambulatory Visit: Payer: Self-pay | Admitting: Internal Medicine

## 2021-02-01 DIAGNOSIS — M545 Low back pain, unspecified: Secondary | ICD-10-CM

## 2021-02-15 ENCOUNTER — Encounter: Payer: Self-pay | Admitting: Internal Medicine

## 2021-02-15 ENCOUNTER — Ambulatory Visit (INDEPENDENT_AMBULATORY_CARE_PROVIDER_SITE_OTHER): Payer: Medicare Other | Admitting: Internal Medicine

## 2021-02-15 ENCOUNTER — Other Ambulatory Visit: Payer: Self-pay

## 2021-02-15 VITALS — BP 103/59 | HR 64 | Temp 98.6°F | Wt 140.7 lb

## 2021-02-15 DIAGNOSIS — N390 Urinary tract infection, site not specified: Secondary | ICD-10-CM

## 2021-02-15 DIAGNOSIS — F419 Anxiety disorder, unspecified: Secondary | ICD-10-CM

## 2021-02-15 DIAGNOSIS — I10 Essential (primary) hypertension: Secondary | ICD-10-CM

## 2021-02-15 DIAGNOSIS — N3000 Acute cystitis without hematuria: Secondary | ICD-10-CM | POA: Diagnosis not present

## 2021-02-15 DIAGNOSIS — M545 Low back pain, unspecified: Secondary | ICD-10-CM | POA: Diagnosis present

## 2021-02-15 DIAGNOSIS — K219 Gastro-esophageal reflux disease without esophagitis: Secondary | ICD-10-CM | POA: Diagnosis not present

## 2021-02-15 DIAGNOSIS — R29898 Other symptoms and signs involving the musculoskeletal system: Secondary | ICD-10-CM

## 2021-02-15 MED ORDER — SULFAMETHOXAZOLE-TRIMETHOPRIM 800-160 MG PO TABS
1.0000 | ORAL_TABLET | Freq: Two times a day (BID) | ORAL | 0 refills | Status: AC
Start: 2021-02-15 — End: 2021-02-18

## 2021-02-15 MED ORDER — TRAMADOL HCL 50 MG PO TABS: 50.0000 mg | ORAL_TABLET | Freq: Four times a day (QID) | ORAL | 3 refills | Status: DC | PRN

## 2021-02-15 MED ORDER — NAPROXEN 250 MG PO TABS: ORAL_TABLET | ORAL | 2 refills | Status: DC

## 2021-02-15 NOTE — Patient Instructions (Addendum)
Sr. Danny Walker, [place] un placer verlo hoy!  Hoy discutimos: Dijiste que te sentas bien hoy. Dijo que tena una mayor debilidad en las piernas. Recomendamos fisioterapia pero no recibiste una llamada de ellos. Est interesado en recibir fisioterapia a domicilio con interpretacin en espaol. Su presin arterial est bien controlada. Realizaremos pedido de fisioterapia y reposicin de tramadol y naproxeno. Pediremos antibiticos que Engineer, materials. [employer] para la regularidad.  He ordenado los siguientes laboratorios hoy:  rdenes de laboratorio No se ordenaron pruebas de laboratorio hoy    Referencias ordenadas hoy:  rdenes de referencia No se solicitaron referencias hoy    He ordenado el siguiente medicamento/cambiado los siguientes medicamentos:  Suspender los siguientes medicamentos: No hay medicamentos descontinuados.  Iniciar los siguientes medicamentos: No se realizaron pedidos de los tipos definidos en este encuentro.    Seguimiento: 2 semanas de Science writer de llegar 15 minutos antes de su prxima cita. Si llega tarde, es posible que se IT trainer.  Esperamos verte la prxima vez. Llame a nuestra clnica al [phone] si tiene alguna pregunta o inquietud. El mejor horario para llamar es de lunes a viernes de 9 a. m. a 4 p. m., pero hay alguien disponible las 24 horas del da, los 7 das de la [place]. Si es fuera del horario de atencin o durante el fin de [place], llame al nmero principal del hospital y pregunte por el residente de guardia de medicina interna. Si necesita reposicin de medicamentos, por favor notifique a su farmacia con una semana de anticipacin y ellos nos enviarn una solicitud.  Gracias por dejarnos participar en su cuidado. Deseandote lo mejor!  Collie Siad, MD

## 2021-02-15 NOTE — Progress Notes (Signed)
CC: follow up  HPI:  Danny Walker is a 75 y.o. with medical history as below presenting to [facility] for follow up.   Please see problem-based list for further details, assessments, and plans.  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   DDD (degenerative disc disease), lumbosacral    Debility    Diverticulitis 05/01/2020   Encephalopathy acute    Endotracheally intubated    Fatty liver    Foley catheter in place    Hip pain 09/30/2019   History of gallstones    History of prostatitis    Hypertension    IDA (iron deficiency anemia)    Leg weakness 03/23/2020   Liver mass 09/17/2001   4.7cm , noted on [place] abd   Low back pain    Myasthenia gravis ([place])    Myasthenia gravis with acute exacerbation ([place]) 01/24/2020   Myasthenia gravis, bulbar ([place]) 04/06/2018   Nocturia    Perforation of sigmoid colon due to diverticulitis    Peritonitis ([place])    Protein-calorie malnutrition, severe 04/06/2018   Renal cyst, 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   1.3 cm simple, noted on [place] ABD   Right shoulder pain 08/20/2019   Spondylosis Lumbar   Steroid-induced hyperglycemia    Superficial bruising of arm, right, initial encounter 05/31/4172   Umbilical hernia    Unintentional weight loss 04/03/2018   Urinary retention 02/08/2018   Visual disturbance 03/23/2020   Weakness of both legs 02/2020   Review of Systems:  Review of system negative unless stated in the problem list or HPI.    Physical Exam:  Vitals:   02/15/21 0946  BP: (!) 103/59  Pulse: 64  Temp: 98.6 F (37 C)  TempSrc: Oral  SpO2: 98%  Weight: 140 lb 11.2 oz (63.8 kg)    Physical Exam Constitutional:      General: He is not in acute distress.    Appearance: Normal appearance. He is not ill-appearing.  HENT:     Head: Normocephalic and atraumatic.     Right Ear:  External ear normal.     Left Ear: External ear normal.     Nose: Nose normal.     Mouth/Throat:     Mouth: Mucous membranes are moist.     Pharynx: Oropharynx is clear. No oropharyngeal exudate or posterior oropharyngeal erythema.  Eyes:     Extraocular Movements: Extraocular movements intact.     Conjunctiva/sclera: Conjunctivae normal.     Pupils: Pupils are equal, round, and reactive to light.  Cardiovascular:     Rate and Rhythm: Normal rate and regular rhythm.     Pulses: Normal pulses.     Heart sounds: Normal heart sounds. No murmur heard.   No friction rub.  Pulmonary:     Effort: Pulmonary effort is normal.     Breath sounds: Normal breath sounds.  Abdominal:     General: Bowel sounds are normal.     Palpations: Abdomen is soft.  Musculoskeletal:        General: No swelling, tenderness, deformity or signs of injury. Normal range of motion.     Cervical back: Normal range of motion and neck supple.     Right lower leg: No edema.     Left lower leg: No edema.  Skin:    Capillary Refill: Capillary refill  takes less than 2 seconds.     Coloration: Skin is not jaundiced or pale.     Findings: No erythema, lesion or rash.  Neurological:     General: No focal deficit present.     Mental Status: He is alert and oriented to person, place, and time. Mental status is at baseline.     Deep Tendon Reflexes: Reflexes abnormal (decreased patellar reflexes).  Psychiatric:        Mood and Affect: Mood normal.        Behavior: Behavior normal.    Assessment & Plan:   See Encounters Tab for problem based charting.  Patient seen with Dr. Dollene Cleveland, MD

## 2021-02-15 NOTE — Progress Notes (Signed)
Bbladder scan performed by NT. 28 mL post void residual. Patient stated he felt like his bladder was completely emptied. Provider notified.

## 2021-02-16 NOTE — Assessment & Plan Note (Signed)
Assessment: Patient's blood pressure well controlled at home. It was slightly lower today but within normal limits. Advised patient to stay adequately hydrated. Denied any symptoms.   Plan: -Continue current medication lisinopril-HCTZ 20-12.5 mg -Continue to monitor

## 2021-02-16 NOTE — Assessment & Plan Note (Signed)
Assessment: Patient has GERD that is well controlled. He denied any associated symptoms.   Plan: -Continue Prilosec 20 mg

## 2021-02-20 ENCOUNTER — Encounter: Payer: Self-pay | Admitting: Internal Medicine

## 2021-02-20 DIAGNOSIS — Z87440 Personal history of urinary (tract) infections: Secondary | ICD-10-CM | POA: Insufficient documentation

## 2021-02-20 DIAGNOSIS — N390 Urinary tract infection, site not specified: Secondary | ICD-10-CM | POA: Insufficient documentation

## 2021-02-20 DIAGNOSIS — R29898 Other symptoms and signs involving the musculoskeletal system: Secondary | ICD-10-CM | POA: Insufficient documentation

## 2021-02-20 NOTE — Assessment & Plan Note (Addendum)
Assessment:  Patient has chronic back pain that is managed with Tramadol 50 mg q6hrs prn, Lyrica 50 mg TID and as needed Naproxen 250 mg.  CT scan earlier this month in the ED showed significant neuroforaminal narrowing at L4-L5 and L5-S. PT referral was provided in the last visit but patient stated he had not heard anything about this. He wanted to work with physical therapy after I explained to him that it will benefit his back pain along with potentially helping his LE weakness.   Plan: -Refill Tramadol 50 mg q6hrs 30 tablets with 3 refills -Continue Lyrica 50 mg TID; will up-titrate as tolerated -Naproxen 250 mg prn -Physical Therapy referral

## 2021-02-20 NOTE — Assessment & Plan Note (Signed)
Assessment: Patient stated he feels anxious and has been eating due to this feeling. He stated he feels his belly is distended. With recent ED visit for abdominal pain that showed UTI from Klebsiella. We performed a post void bladder scan with RV of 7ml. Advised patient to return in 2 weeks so we can discuss his anxiety issue thoroughly as this has been mentioned in the past. Patient agreed for 2 week follow up.   Plan: -Follow up on anxiety in 2 weeks

## 2021-02-20 NOTE — Assessment & Plan Note (Signed)
Assessment: Patient presented to the ED with complaint of abdominal pain earlier this month. Imaging did not find any acute findings but a urinalysis showed evidence of a bacterial infection.  Cultures later grew Klebsiella but the patient was already discharged from the ED at that time and did not receive treatment.  Patient does have a history of BPH so a postvoid ultrasound was performed at this but was negative for retention.  Patient had complained of increasing bilateral leg weakness but denied having any other symptoms such as burning or difficulty urinating.  Plan: - Bactrim DS 1 tablet twice daily for 3 days - Follow-up in 2 weeks as patient is coming back for an anxiety work-up

## 2021-02-20 NOTE — Assessment & Plan Note (Addendum)
Assessment: Patient complained of leg weakness that is getting worse. He initially had difficulty separating weakness from edema. He stated this is a chronic issue for him but has been getting worse recently. Ddx include weakness 2/2 myasthenia gravis vs weakness 2/2 to spinal stenosis vs weakness from UTI vs anemia vs B12 deficiency. Patient stated it is not similar to his weakness with myasthenia gravis and doesn't get worse with activity. Strength testing showed no abnormality. Could be secondary to his low back pain as he has spinal stenosis. Patient went to ED with abdominal pain earlier this month was was found to have a UTI with culture growing Kelbsiella. He went home before treatment ucx returned and never started treatment. Another likely cause is VB12 deficiency as patient's B12 was 182 in 02/2020.  Plan: -Check B12 next visit -PT referral for back pain and LE weakness -Bactrim DS 1 tablet BID for 3 days

## 2021-02-27 ENCOUNTER — Other Ambulatory Visit: Payer: Self-pay | Admitting: Internal Medicine

## 2021-02-27 DIAGNOSIS — K219 Gastro-esophageal reflux disease without esophagitis: Secondary | ICD-10-CM

## 2021-03-01 NOTE — Progress Notes (Signed)
Internal Medicine Clinic Attending  I saw and evaluated the patient.  I personally confirmed the key portions of the history and exam documented by Dr. Humphrey Rolls and I reviewed pertinent patient test results.  The assessment, diagnosis, and plan were formulated together and I agree with the documentation in the resident's note.   Please start treatment for his vitamin B12 deficiency. Sending to the resident pool to address.

## 2021-03-04 ENCOUNTER — Encounter: Payer: Medicare Other | Admitting: Pharmacist

## 2021-03-04 ENCOUNTER — Encounter: Payer: Self-pay | Admitting: Student

## 2021-03-04 ENCOUNTER — Ambulatory Visit (INDEPENDENT_AMBULATORY_CARE_PROVIDER_SITE_OTHER): Payer: Medicare Other | Admitting: Student

## 2021-03-04 DIAGNOSIS — F5101 Primary insomnia: Secondary | ICD-10-CM

## 2021-03-04 DIAGNOSIS — N390 Urinary tract infection, site not specified: Secondary | ICD-10-CM | POA: Diagnosis not present

## 2021-03-04 DIAGNOSIS — F419 Anxiety disorder, unspecified: Secondary | ICD-10-CM

## 2021-03-04 DIAGNOSIS — N401 Enlarged prostate with lower urinary tract symptoms: Secondary | ICD-10-CM | POA: Diagnosis not present

## 2021-03-04 DIAGNOSIS — K5900 Constipation, unspecified: Secondary | ICD-10-CM

## 2021-03-04 DIAGNOSIS — R29898 Other symptoms and signs involving the musculoskeletal system: Secondary | ICD-10-CM | POA: Diagnosis present

## 2021-03-04 MED ORDER — CYANOCOBALAMIN 1000 MCG/ML IJ SOLN: 1000.0000 ug | INTRAMUSCULAR | 0 refills | Status: DC

## 2021-03-04 MED ORDER — CYANOCOBALAMIN 1000 MCG/ML IJ SOLN: 1000.0000 ug | INTRAMUSCULAR | 0 refills | Status: AC

## 2021-03-04 NOTE — Assessment & Plan Note (Signed)
Prior history of BPH and TURP.  Patient's current medication regimen of oxybutynin and finasteride.  Patient is uncertain of which these medications he is taking.  He has a follow-up appointment with Dr. Claiborne Billings our clinical pharmacist to determine which medications he is and is not taking.  He denies urinary symptoms at this time and we will continue to monitor.

## 2021-03-04 NOTE — Assessment & Plan Note (Addendum)
Assessment: Patient continues to endorse leg weakness that has been getting presently worse over the past few months.  He has a longstanding history of lower extremity weakness.  He denies any red flag symptoms of fever, saddle anesthesia, loss of bowel or bladder function.  On physical exam his lower extremity strength is 5/5 bilaterally and his patellar and Achilles reflexes are intact.  Sensation is also intact.  On prior visit patient was post to be started on B12 unfortunately I do not think that this occurred.  We will order his B12 injections and follow-up in 4 weeks to see how he is doing and repeat B12 levels.  Also included in my differential is weakness secondary to his myasthenia gravis as well as his spinal stenosis.  We will reach out to patient's neurologist concerning repeat imaging studies that have been ordered in the past. He has follow up with her at the end of this month. Prior imaging has shown severe right L4-5 and L5-S1 neural foraminal stenosis.  However without pain, low suspicion that he has worsening of his compression. Low suspicion this is secondary to myasthenia gravis exacerbation.   Plan: -B12 injections, follow-up in 4 weeks. -Patient has follow-up with neurology at the end of the month, keep this appointment -Will out to patient's neurologist to discuss MRIs of the thoracic and lumbar regions.  Addendum: Patient's neurologist notes that MRI orders were old and no plan for repeat anytime soon. He has follow up appointment with her at the end of the month

## 2021-03-04 NOTE — Assessment & Plan Note (Signed)
Assessment: Patient states that he has bowel movements every 2 days and needs to take a suppository once a week.  He states that when he does have bowel movement is hard in consistency.  Discussed that we will start with fiber supplementation such as Metamucil and to follow instructions on the packaging.  If no improvement when patient follows up in 1 month we will consider addition of MiraLAX/senna.   Plan: -Metamucil daily -Consider MiraLAX or senna follow-up visit if no improvement

## 2021-03-04 NOTE — Assessment & Plan Note (Signed)
Patient denies completing course of antibiotics however he states that he has no ongoing symptoms.  Discouraged patient from continuing these medications discussed with him that if he has any urinary symptoms to call our clinic and we will discuss further.

## 2021-03-04 NOTE — Assessment & Plan Note (Signed)
Assessment: Patient with history of insomnia and difficulty falling asleep.  He was prescribed trazodone in the past however he states that is not helped with sleep.  His TSH was normal recently.  I suspect that his anxiety is contributing to his lack of sleep.  Patient states that his anxiety has been uncontrolled.  He denies wanting to pursue counseling services or medications at this time, we will discuss at his follow-up visit in 1 month once he set time to think about it.  Plan: -Continue to work on anxiety and continue to monitor sleep status.

## 2021-03-04 NOTE — Progress Notes (Signed)
CC: Lower extremity weakness, anxiety, constipation  HPI:  [name] is a 75 y.o. male with a past medical history stated below and presents today for lower extremity weakness, anxiety, constipation. Please see problem based assessment and plan for additional details.  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   DDD (degenerative disc disease), lumbosacral    Debility    Diverticulitis 05/01/2020   Encephalopathy acute    Endotracheally intubated    Fatty liver    Foley catheter in place    Hip pain 09/30/2019   History of gallstones    History of prostatitis    Hypertension    IDA (iron deficiency anemia)    Leg weakness 03/23/2020   Leg weakness, bilateral 03/23/2020   Liver mass 09/17/2001   4.7cm , noted on [place] abd   Low back pain    Myasthenia gravis ([place])    Myasthenia gravis with acute exacerbation ([place]) 01/24/2020   Myasthenia gravis, 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   Nocturia    Perforation of sigmoid colon due to diverticulitis    Peritonitis ([place])    Protein-calorie malnutrition, severe 04/06/2018   Renal cyst, 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   1.3 cm simple, noted on [place] ABD   Right shoulder pain 08/20/2019   Spondylosis Lumbar   Steroid-induced hyperglycemia    Superficial bruising of arm, right, initial encounter 0/06/6376   Umbilical hernia    Unintentional weight loss 04/03/2018   Urinary retention 02/08/2018   Visual disturbance 03/23/2020   Weakness of both legs 02/2020    Current Outpatient Medications on File Prior to Visit  Medication Sig Dispense Refill   Calcium Carb-Cholecalciferol (OYSTER SHELL CALCIUM W/D) 500-200 MG-UNIT TABS TOME UNA TABLETA DOS VECES AL DIA 180 tablet 0   ferrous sulfate 325 (65 FE) MG tablet TOME UNA TABLETA TODOS LOS  DIAS (Patient not taking: Reported on 12/31/2020) 90 tablet 1   finasteride (PROSCAR) 5 MG tablet TAKE 1 TABLET (5 MG TOTAL) BY MOUTH EVERY MORNING. (Patient not taking: Reported on 12/31/2020) 90 tablet 3   lisinopril-hydrochlorothiazide (ZESTORETIC) 20-12.5 MG tablet Take 1 tablet by mouth daily. 30 tablet 11   naproxen (NAPROSYN) 250 MG tablet TOME UNA TABLETA DOS VECES AL DIA CON ALIMENTO 28 tablet 2   neomycin-polymyxin b-dexamethasone (MAXITROL) 3.5-10000-0.1 OINT Place 1 application into both eyes at bedtime. (Patient not taking: Reported on 12/31/2020)     omeprazole (PRILOSEC) 40 MG capsule Take 1 capsule (40 mg total) by mouth daily. 90 capsule 0   oxybutynin (DITROPAN) 5 MG tablet Take 5 mg by mouth 3 (three) times daily.     predniSONE (DELTASONE) 20 MG tablet Take 1.5 tablets (30 mg total) by mouth daily. 135 tablet 0   pregabalin (LYRICA) 50 MG capsule TOME 1 CAPSULA POR VIA ORAL DOS VECES AL DIA DURANTE DOS SEMANAS, ENTONCES TRES VECES AL DIA 90 capsule 0   Propylene Glycol (SYSTANE COMPLETE OP) Place 1 drop into both eyes 3 (three) times daily as needed (dry eyes). (Patient not taking: Reported on 12/31/2020)     pyridostigmine (MESTINON) 60 MG tablet TAKE 1/2 TABLET AT 7AM, 12PM, AND 5PM EACH DAY. (Patient taking differently: Take 30 mg by mouth 3 (three) times daily.) 135 tablet 4   traMADol (ULTRAM) 50 MG tablet  Take 1 tablet (50 mg total) by mouth every 6 (six) hours as needed. 30 tablet 3   traZODone (DESYREL) 50 MG tablet TOME UNA TABLETA TODOS LOS DIAS AL ACOSTARSE 90 tablet 1   No current facility-administered medications on file prior to visit.    Family History  Problem Relation Age of Onset   Hypertension Mother    Hypertension Father     Social History   Socioeconomic History   Marital status: Widowed    Spouse name: Not on file   Number of children: 1   Years of education: 12   Highest education level: High school graduate  Occupational History   Occupation:  Airline pilot: [employer]  Tobacco Use   Smoking status: Never   Smokeless tobacco: Never  Vaping Use   Vaping Use: Never used  Substance and Sexual Activity   Alcohol use: No   Drug use: Never   Sexual activity: Yes  Other Topics Concern   Not on file  Social History Narrative   ** Merged History Encounter **       Lives with daughter in a one story home.  Education: high school.  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   Right handed One story house Drinks little coffee   Social Determinants of Health   Financial Resource Strain: Not on file  Food Insecurity: Not on file  Transportation Needs: Not on file  Physical Activity: Not on file  Stress: Not on file  Social Connections: Not on file  Intimate Partner Violence: Not on file    Review of Systems: ROS negative except for what is noted on the assessment and plan.  Vitals:   03/04/21 1502  BP: (!) 122/58  Pulse: 73  Temp: 97.7 F (36.5 C)  TempSrc: Oral  SpO2: 100%  Weight: 141 lb 9.6 oz (64.2 kg)  Height: 5\' 3"  (1.6 m)    Physical Exam: Constitutional: Elderly gentleman, well-appearing HENT: normocephalic atraumatic Eyes: conjunctiva non-erythematous Neck: supple Cardiovascular: regular rate  Pulmonary/Chest: normal work of breathing on room air MSK: normal bulk and tone Neurological: alert & oriented x 3, 5/5 bilateral lower extremity strength.  Normal sensation.  Babinski reflex negative.  Patellar and Achilles reflexes intact. Skin: warm and dry Psych: Normal mood and thought process  Assessment & Plan:   See Encounters Tab for problem based charting.  Patient discussed with Dr.  Butch Penny, D.O. [facility], PGY-2 Pager: [phone], Phone: [phone] Date 03/04/2021 Time 6:18 PM

## 2021-03-04 NOTE — Patient Instructions (Signed)
Clayburn Pert, Mr.Thales Katy Fitch por permitirnos brindarle atencin hoy. hoy discutimos  Gracias por venir a Engineer, maintenance (IT) hoy!  Nosotros discutimos  1 punto dbil: nos pondremos en contacto con su farmacia para ver si administran inyecciones de vitamina B12. Haremos esto durante 4 semanas y [facility] que regrese a Doctor, general practice clnica para volver a verificar sus niveles de vitamina B12. Si nota que est orinando o defecando, llame a nuestra clnica. Si tiene fiebre o entumecimiento/hormigueo en el recto, llame a nuestra clnica.  2. Estreimiento  Compre un suplemento de fibra de venta libre (Metamucil). Por favor, use diariamente. Haremos un seguimiento para ver si todava est estreido.  3. Ansiedad Si contina teniendo ansiedad, llame a nuestra clnica. Piense en ver a nuestro consejero para hablar sobre su ansiedad.  4 Diabetes Revisaremos su A1c en su prxima visita  He ordenado los siguientes laboratorios para usted:  Lab Orders  No laboratory test(s) ordered today     Pruebas ordenadas hoy:  Referencias ordenadas hoy:  Referral Orders  No referral(s) requested today     He ordenado el siguiente medicamento/cambiado los siguientes medicamentos:  Suspender los siguientes medicamentos: There are no discontinued medications.   Iniciar los siguientes medicamentos: No orders of the defined types were placed in this encounter.    Seguimiento  2 weeks Dr. Georgina Peer, 4 weeks Me    Si tiene alguna pregunta o inquietud, llame a la clnica de medicina interna al [phone].     Sanjuana Letters, D.O. [place]

## 2021-03-04 NOTE — Assessment & Plan Note (Signed)
Assessment: Patient continues to endorse anxiety that leads to him to eating large meals.  Recent stressors include his chronic medical conditions as well as the passing of his wife.  Patient denies having a good support system locally.  He does not want to pursue counseling services or medications.  We will follow-up subsequent visit next month to discuss these options further with him.  Patient denied suicidal ideation and wanting to harm himself during this visit.  Plan: -Consider medication and counseling with Dr. Carolynne Edouard at subsequent visits. -Patient given strict return precautions if he has feeling of SI or signs of depression to call clinic -At next visit we will follow-up with PHQ-9.

## 2021-03-05 NOTE — Progress Notes (Signed)
Internal Medicine Clinic Attending  Case discussed with Dr. Katsadouros at the time of the visit.  We reviewed the resident's history and exam and pertinent patient test results.  I agree with the assessment, diagnosis, and plan of care documented in the resident's note.  

## 2021-03-10 ENCOUNTER — Other Ambulatory Visit: Payer: Self-pay | Admitting: Neurology

## 2021-03-15 ENCOUNTER — Encounter: Payer: Medicare Other | Admitting: Pharmacist

## 2021-03-15 ENCOUNTER — Encounter: Payer: Medicare Other | Admitting: Internal Medicine

## 2021-03-18 ENCOUNTER — Ambulatory Visit (INDEPENDENT_AMBULATORY_CARE_PROVIDER_SITE_OTHER): Payer: Medicare Other | Admitting: Pharmacist

## 2021-03-18 DIAGNOSIS — Z79899 Other long term (current) drug therapy: Secondary | ICD-10-CM | POA: Diagnosis present

## 2021-03-18 NOTE — Progress Notes (Signed)
   Subjective:    Patient ID: Danny Walker, male    DOB: 1946/04/08, 74 y.o.   MRN: [id number]  HPI  Patient is a 75 y.o. male who presents for medication review and management. He is in good spirits and presents with assistance of cane. [place] interpreter [phone]. Patient was referred and last seen by Primary Care Provider on 03/04/21.  Oxybutynin 1 before bedtime - would recommend switching to ER formulation  Prednisone 1 1/2  Bactrim - had one pill left - asked if he should take it  No omeprazole - denies any GERD symptoms  Medication Adherence Questionnaire (A score of 2 or more points indicates risk for nonadherence)  Do you know what each of your medicines is for? 1 (1 point if no)  Do you ever have trouble remembering to take your medicine? 0 (2 points if yes)  Do you ever not take a medicine because you feel you do not need it?  0 (1 point if yes)  Do you think that any of your medicines is not helping you? 1 (1 point if yes)  Do you have any physical problems such as vision loss that keep you from taking your medicines as prescribed?  0 (2 points if yes)  Do you think any of your medicine is causing a side effect?  0 (1 point if yes)  Do you know the names of ALL of your medicines? 0 (1 point if no)  Do you think that you need ALL of your medicines? 0 (1 point if no)  In the past 6 months, have you missed getting a refill or a new prescription filled on time? 0 (1 point if yes)  How often do you miss taking a dose of medicine? 0 Never (0 points), 1 or 2 times a month (1 points), 1 time a week (2 points), 2 or more times a week (3 points).   TOTAL SCORE 2/14    Objective:   Labs:   Physical Exam Neurological:     Mental Status: He is alert and oriented to person, place, and time.    Assessment/Plan:   Understanding of regimen: good  Understanding of indications: fair  Potential of compliance: good  Patient has known adherence challenges based on score of 2  for questionnaire. Language is likely cause of barrier as I believe this leads to confusion in medications and dosing of medications. Patient does try to take medications as instructed but it was apparent during visit that he was not aware of some of the correct directions. Medication list reviewed and updated. The following issues were noted: patient only taking 1 oxybutynin before bedtime and had one pill left of bactrim from months ago. Patient was provided with a printed medication list.   Recommend switching from IR to ER to eliminate multiple daily dosing due to patient not properly taking.  Follow-up appointment with PCP in two weeks. Written patient instructions provided.  This appointment required 60 minutes of direct patient care.  Thank you for involving pharmacy to assist in providing this patient's care.

## 2021-03-18 NOTE — Patient Instructions (Addendum)
Sr. Danny Walker fue un placer verlo hoy.  Hoy revisamos todos los medicamentos que est tomando actualmente. Se incluye una lista actualizada de medicamentos. Contine tomando todos los [employer] segn lo Immunologist.  Si tiene [place], llame a la clnica y pida hablar conmigo.  Seguimiento con el Dr. Renato Battles 10 de noviembre  [name] it was a pleasure seeing you today.   Today we reviewed all of the medications you are currently taking. Included is an updated medication list. Please continue taking all medications as prescribed on this list.  If you have any questions please call the clinic and ask to speak with me.  Follow-up with Dr. Howie Ill on November 10

## 2021-03-22 ENCOUNTER — Ambulatory Visit (INDEPENDENT_AMBULATORY_CARE_PROVIDER_SITE_OTHER): Payer: Medicare Other | Admitting: Neurology

## 2021-03-22 ENCOUNTER — Encounter: Payer: Self-pay | Admitting: Neurology

## 2021-03-22 ENCOUNTER — Other Ambulatory Visit: Payer: Self-pay

## 2021-03-22 VITALS — BP 116/71 | HR 89 | Ht 63.0 in | Wt 140.0 lb

## 2021-03-22 DIAGNOSIS — R2681 Unsteadiness on feet: Secondary | ICD-10-CM | POA: Diagnosis not present

## 2021-03-22 DIAGNOSIS — G7000 Myasthenia gravis without (acute) exacerbation: Secondary | ICD-10-CM

## 2021-03-22 MED ORDER — PREDNISONE 10 MG PO TABS: ORAL_TABLET | ORAL | 5 refills | Status: DC

## 2021-03-22 MED ORDER — PYRIDOSTIGMINE BROMIDE 60 MG PO TABS: ORAL_TABLET | ORAL | 4 refills | Status: DC

## 2021-03-22 NOTE — Addendum Note (Signed)
Addended by: Armen Pickup A on: 03/22/2021 01:23 PM   Modules accepted: Orders

## 2021-03-22 NOTE — Progress Notes (Signed)
Follow-up Visit   Date: 03/22/21   Danny Walker MRN: [id number] DOB: [birth year]   Interim History: Danny Walker is a 75 y.o. Spanish-speaking male from [place] with history of hypertension, prostatitis, liver mass, urinary retention, diverticulitis s/p sigmoid resection returning to the clinic for follow-up of seropositive myasthenia gravis (diagnosed 2019).  The patient was accompanied to the clinic by Spanish interpretor.  History of present illness: Patient was diagnosed with seroposotive myasthenia gravis in 2019 after being admitted to [facility] with dysphagia, dysarthria, diplopia and having robust response to mestinon. He required NG tube, no intubation. CT chest negative for thymoma.  His prednisone was gradually increased to 60mg /d and mestinon 60mg  QID. Prednisone has been tapered and he has been on 10mg  since August 2020.   Due to leg cramps, his mestinon was reduced to 30mg  BID which has helped.  Patient was doing well until July 2021 when he began having bulbar weakness, so prednisone was increased to 40mg /d.    In 2021, he had multiple ER visits, at one visit there was concern for STEMI, but patient left AMA without recommended cardiac work-up.  He returned to ER two days later with dizziness/lightheadedness and suffered a fall in the bathtub.  Orthostatic were positive.  CT head negative and again, no evidence of MG exacerbation.   He complains of weakness in the legs.     UPDATE 12/31/2020:  He is here for follow-up.  He was last seen in the office in November 2021.  Since then he was hospitalized with diverticulitis complicated by septic shock requiring sigmoid resection s/p colostomy followed by reversal.  He has several ER visits in 2022 for bleeding from his surgical site.  He was last seen in November and given a tapering prednisone dose (40mg  > 30mg ) and lost to follow-up since then.  He tells me that he has been taking prednisone 35mg /d and mestinon 30mg   three times daily.   Starting two weeks ago, he complains of difficulty swallowing liquids and solids and generalized weakness.  He suffered one fall when he walked outside without using his cane. He also complains of double vision which is intermittent.  He has returned to work at [employer] about a month ago. He AC and ventilation is very poor and he does not seem to have supportive staff.   UPDATE 03/22/2021:  He is here for follow-up visit.  He was able to reduce prednisone down to 20mg  daily and denies any new weakness, such as droopiness of the eyes, difficulty swallowing/talking, or double vision.  He has reduced his hours working at [employer] to 4 hours on Saturday and Sunday only.  He reports having imbalance and sensation of weakness in the legs.  He uses a cane for balance, only as needed.  MRI lumbar spine from 2021 did not show canal stenosis, there was right foraminal narrowing at L4-5 and L5-S1.  He denies radicular leg pain.   Medications:  Current Outpatient Medications on File Prior to Visit  Medication Sig Dispense Refill   cyanocobalamin (,VITAMIN B-12,) 1000 MCG/ML injection Inject 1 mL (1,000 mcg total) into the muscle once a week for 4 doses. 4 mL 0   ferrous sulfate 325 (65 FE) MG tablet TOME UNA TABLETA TODOS LOS DIAS 90 tablet 1   finasteride (PROSCAR) 5 MG tablet TAKE 1 TABLET (5 MG TOTAL) BY MOUTH EVERY MORNING. (Patient not taking: No sig reported) 90 tablet 3   lisinopril-hydrochlorothiazide (ZESTORETIC) 20-12.5 MG tablet Take 1  tablet by mouth daily. 30 tablet 11   naproxen (NAPROSYN) 250 MG tablet TOME UNA TABLETA DOS VECES AL DIA CON ALIMENTO 28 tablet 2   neomycin-polymyxin b-dexamethasone (MAXITROL) 3.5-10000-0.1 OINT Place 1 application into both eyes at bedtime. (Patient not taking: Reported on 12/31/2020)     omeprazole (PRILOSEC) 40 MG capsule TOME UNA CAPSULA TODOS LOS DIAS (Patient not taking: Reported on 03/18/2021) 90 capsule 0   oxybutynin (DITROPAN) 5 MG tablet Take 5  mg by mouth 3 (three) times daily.     pregabalin (LYRICA) 50 MG capsule TOME 1 CAPSULA POR VIA ORAL DOS VECES AL DIA DURANTE DOS SEMANAS, ENTONCES TRES VECES AL DIA 90 capsule 0   Propylene Glycol (SYSTANE COMPLETE OP) Place 1 drop into both eyes 3 (three) times daily as needed (dry eyes). (Patient not taking: Reported on 12/31/2020)     traMADol (ULTRAM) 50 MG tablet Take 1 tablet (50 mg total) by mouth every 6 (six) hours as needed. 30 tablet 3   traZODone (DESYREL) 50 MG tablet TOME UNA TABLETA TODOS LOS DIAS AL ACOSTARSE 90 tablet 1   No current facility-administered medications on file prior to visit.    Allergies: No Known Allergies  Vital Signs:  BP 116/71   Pulse 89   Ht 5\' 3"  (1.6 m)   Wt 140 lb (63.5 kg)   SpO2 98%   BMI 24.80 kg/m   Neurological Exam: MENTAL STATUS including orientation to time, place, person, recent and remote memory, attention span and concentration, language, and fund of knowledge is normal.  Speech is clear, no dysarthria.   CRANIAL NERVES:  Pupils are round and reactive. Normal conjugate, extra-ocular eye movements in all directions of gaze.  No ptosis at rest or with sustained upgaze. No bulbar weakness. Facial muscles are 5/5.   MOTOR:  Motor strength is 5/5 in all extremities, including neck flexion and hip flexors.   MSRs:                                           Right        Left brachioradialis 2+  2+  biceps 2+  2+  triceps 2+  2+  patellar 1+  2+  ankle jerk 2+  2+   COORDINATION/GAIT:   Gait wide-based and stable. Unable to stand up without using arms to push off.   Data: MRI brain wo contrast 04/03/2018: 1. Discontinued examination due to patient's cessation of choking. 2. No acute intracranial abnormality. Chronic small vessel ischemia.  CT chest w contrast 04/07/2018: No evidence of anterior mediastinal mass to suggest thymoma. No acute cardiopulmonary disease.  Labs 04/04/2018:  AChR binding 7.86*, blocking 65*, modulating  57*, TSH 1.77, CK 75, HIV neg, RPR neg  Lab Results  Component Value Date   CKTOTAL 51 04/20/2020   Lab Results  Component Value Date   HGBA1C 5.7 (A) 12/01/2020   Lab Results  Component Value Date   VITAMINB12 182 (L) 03/16/2020   MRI lumbar spine 05/05/2021: 1. No acute abnormality of the thoracic spine. No spinal canal or neural foraminal stenosis. 2. 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 3. 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 4. Multilevel lateral recess narrowing without central spinal canal stenosis.  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  Exam is stable without evidence of bulbar or limb weakness.  -  Continue prednisone 20mg  daily  - Continue mestinon 60mg  in the AM and 30mg  at noon.  Dose lowered due to cramps  2.  Bilateral leg weakness (subjective), no evidence of weakness on exam.  MRI lumbar spine does not show central canal stenosis, there is right foraminal stenosis at L4-5 and L5-S1, however, he denies radicular pain in the right leg and exam only shows diminished right L4 reflex without weakness in the right leg.  I am not sure if his sense of weakness is stemming more from deconditioning or certainly may be associated with known B12 deficiency.  I will make a referral to home PT for leg strengthening and balance training.  Return to clinic 3 months, or sooner as needed   Thank you for allowing me to participate in patient's care.  If I can answer any additional questions, I would be pleased to do so.    Sincerely,    Kinzey Sheriff K. Posey Pronto, DO

## 2021-03-22 NOTE — Patient Instructions (Signed)
Continue your medications as you are taking  We will make a referral for home physical therapy  Return to clinic in 3 months

## 2021-03-25 ENCOUNTER — Other Ambulatory Visit: Payer: Self-pay | Admitting: Student

## 2021-03-28 ENCOUNTER — Other Ambulatory Visit: Payer: Self-pay | Admitting: Neurology

## 2021-04-01 ENCOUNTER — Encounter: Payer: Self-pay | Admitting: Internal Medicine

## 2021-04-01 ENCOUNTER — Ambulatory Visit (INDEPENDENT_AMBULATORY_CARE_PROVIDER_SITE_OTHER): Payer: Medicare Other | Admitting: Internal Medicine

## 2021-04-01 ENCOUNTER — Other Ambulatory Visit: Payer: Self-pay

## 2021-04-01 VITALS — BP 126/70 | HR 71 | Temp 98.4°F | Resp 28 | Ht 63.0 in | Wt 136.8 lb

## 2021-04-01 DIAGNOSIS — K219 Gastro-esophageal reflux disease without esophagitis: Secondary | ICD-10-CM | POA: Diagnosis not present

## 2021-04-01 DIAGNOSIS — E538 Deficiency of other specified B group vitamins: Secondary | ICD-10-CM | POA: Diagnosis not present

## 2021-04-01 DIAGNOSIS — D509 Iron deficiency anemia, unspecified: Secondary | ICD-10-CM | POA: Diagnosis not present

## 2021-04-01 DIAGNOSIS — D649 Anemia, unspecified: Secondary | ICD-10-CM | POA: Diagnosis not present

## 2021-04-01 DIAGNOSIS — R29898 Other symptoms and signs involving the musculoskeletal system: Secondary | ICD-10-CM | POA: Diagnosis not present

## 2021-04-01 DIAGNOSIS — M6281 Muscle weakness (generalized): Secondary | ICD-10-CM | POA: Diagnosis not present

## 2021-04-01 MED ORDER — VITAMIN B-12 1000 MCG PO TABS: 1000.0000 ug | ORAL_TABLET | Freq: Every day | ORAL | 0 refills | Status: AC

## 2021-04-01 NOTE — Patient Instructions (Signed)
[name], it was a pleasure seeing you today!  Today we discussed: Leg weakness I am ordering some bloodwork to see if your blood count is low, and if vitamin and mineral levels are normal.  I will call you with those results in a few days.  I have ordered the following labs today:   Lab Orders         Vitamin B12         Magnesium         CBC no Diff         Ferritin         Iron and IBC ([id number])       Referrals ordered today:   Referral Orders  No referral(s) requested today     I have ordered the following medication/changed the following medications:   Stop the following medications: There are no discontinued medications.   Start the following medications: Meds ordered this encounter  Medications   vitamin B-12 (CYANOCOBALAMIN) 1000 MCG tablet    Sig: Take 1 tablet (1,000 mcg total) by mouth daily.    Dispense:  90 tablet    Refill:  0     Follow-up:  1 month    Please make sure to arrive 15 minutes prior to your next appointment. If you arrive late, you may be asked to reschedule.   We look forward to seeing you next time. Please call our clinic at [phone] if you have any questions or concerns. The best time to call is Monday-Friday from 9am-4pm, but there is someone available 24/7. If after hours or the weekend, call the main hospital number and ask for the Internal Medicine Resident On-Call. If you need medication refills, please notify your pharmacy one week in advance and they will send [place] a request.  Thank you for letting us take part in your care. Wishing you the best!  Thank you, Dr. [facility]

## 2021-04-01 NOTE — Progress Notes (Signed)
Subjective:  CC: Bilateral leg weakness  HPI:  Danny Walker is a 75 y.o. male with a past medical history stated below and presents today for Bilateral leg weakness. Please see problem based assessment and plan for additional details.  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   DDD (degenerative disc disease), lumbosacral    Debility    Diverticulitis 05/01/2020   Encephalopathy acute    Endotracheally intubated    Fatty liver    Foley catheter in place    Hip pain 09/30/2019   History of gallstones    History of prostatitis    Hypertension    IDA (iron deficiency anemia)    Leg weakness 03/23/2020   Leg weakness, bilateral 03/23/2020   Liver mass 09/17/2001   4.7cm , noted on [place] abd   Low back pain    Myasthenia gravis ([place])    Myasthenia gravis with acute exacerbation ([place]) 01/24/2020   Myasthenia gravis, bulbar ([place]) 04/06/2018   Nocturia    Perforation of sigmoid colon due to diverticulitis    Peritonitis ([place])    Protein-calorie malnutrition, severe 04/06/2018   Renal cyst, 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     ferrous sulfate 325 (65 FE) MG tablet TOME UNA TABLETA TODOS LOS DIAS 90 tablet 1   finasteride (PROSCAR) 5 MG tablet TAKE 1 TABLET (5 MG TOTAL) BY MOUTH EVERY MORNING. (Patient  not taking: No sig reported) 90 tablet 3   lisinopril-hydrochlorothiazide (ZESTORETIC) 20-12.5 MG tablet Take 1 tablet by mouth daily. 30 tablet 11   naproxen (NAPROSYN) 250 MG tablet TOME UNA TABLETA DOS VECES AL DIA CON ALIMENTO 28 tablet 2   predniSONE (DELTASONE) 10 MG tablet Take 20mg  daily. 90 tablet 5   pregabalin (LYRICA) 50 MG capsule TOME 1 CAPSULA POR VIA ORAL DOS VECES AL DIA DURANTE DOS SEMANAS, ENTONCES TRES VECES AL DIA 90 capsule 0   pyridostigmine (MESTINON) 60 MG tablet Take 1 tablet in the morning and half tablet at lunch. 135 tablet 4   traMADol (ULTRAM) 50 MG tablet Take 1 tablet (50 mg total) by mouth every 6 (six) hours as needed. 30 tablet 3   traZODone (DESYREL) 50 MG tablet TOME UNA TABLETA TODOS LOS DIAS AL ACOSTARSE 90 tablet 1   No current facility-administered medications on file prior to visit.    Family History  Problem Relation Age of Onset   Hypertension Mother    Hypertension Father     Social History   Socioeconomic History   Marital status: Widowed    Spouse name: Not on file   Number of children: 1   Years of education: 12   Highest education level: High school graduate  Occupational History   Occupation: Airline pilot: [employer]  Tobacco Use   Smoking status: Never   Smokeless tobacco: Never  Vaping Use   Vaping Use: Never used  Substance and Sexual Activity   Alcohol use: No   Drug use: Never   Sexual activity: Yes  Other Topics Concern   Not on file  Social History Narrative   ** Merged History Encounter **       Lives with daughter in a one story home.  Education: high school.  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   Right handed One story house Drinks little coffee   Social Determinants of Health   Financial Resource Strain: Not on file  Food Insecurity: Not on file  Transportation Needs: Not on file  Physical Activity: Not on file  Stress: Not on file  Social Connections: Not on file   Intimate Partner Violence: Not on file    Review of Systems: ROS negative except for what is noted on the assessment and plan.  Objective:   Vitals:   04/01/21 1352  BP: 126/70  Pulse: 71  Resp: (!) 28  Temp: 98.4 F (36.9 C)  TempSrc: Oral  SpO2: 98%  Weight: 136 lb 12.8 oz (62.1 kg)  Height: 5\' 3"  (1.6 m)    Physical Exam: Gen: A&O x3 and in no apparent distress, well appearing and nourished. HEENT:    Head - normocephalic, atraumatic.    Eye - visual acuity grossly intact, conjunctiva clear, sclera non-icteric, EOM intact.    Mouth - No obvious caries or periodontal disease. Neck: no masses or nodules, AROM intact. CV: RRR, no murmurs, S1/S2 presents  Resp: Clear to ascultation bilaterally  Abd: BS (+) x4, soft, non-tender abdomen, without hepatosplenomegaly or masses MSK: 5 out of 5 muscle strength in hip flexion, extension, knee flexion extension, ankle flexion and extension, normal bulk and tone, 2+ dorsalis pedis pulses bilaterally, sensation intact in L5-S1, patellar reflex on left 2+, patellar reflex on right 0 Skin: good skin turgor, no rashes, unusual bruising, or prominent lesions.  Neuro: No focal deficits, grossly normal sensation and coordination.  Psych: Oriented x3 and responding appropriately. Intact memory, normal mood, judgement, affect, and insight.    Assessment & Plan:  See Encounters Tab for problem based charting.  Patient seen with Dr. Juluis Rainier Shadana Pry, D.O. [facility]  PGY-1 Pager: [phone]  Phone: [phone] Date 04/02/2021  Time 6:59 AM

## 2021-04-02 NOTE — Assessment & Plan Note (Signed)
Would prefer to discontinue taking omeprazole because he thinks this is what led to his bilateral leg weakness.  Instructed patient that he can stop taking this medication, but if he were to have worsening acid reflux symptoms to give the clinic a call.  -Discontinue Omeprazole 40 mg

## 2021-04-02 NOTE — Assessment & Plan Note (Addendum)
Danny Walker presents due to continued leg weakness bilaterally.  He describes leg weakness as continuous, requiring a cane to walk.  He has not had a fall due to leg weakness.  He states that he believes that this started around the same time that he started taking omeprazole.  He denies red flag symptoms including fever, loss of bowel or bladder function.  On physical exam his lower extremity strength is 5 out of 5 bilaterally, no muscle atrophy in lower extremities, patellar reflex on the left intact, patellar reflex difficult to elicit on the right, sensation intact.  Reports that he has been injecting B12 weekly.  He states that he continues to have pain in his back but it has not worsened and the pain does not radiate down his legs.  He recently saw his neurologist at the end of October and they stated that the weakness was likely not due to myasthenia gravis.  He has been unable to work with physical therapy due to no Spanish-speaking physical therapist being available.  Differentials include vitamin B12 deficiency, hypomagnesemia, and iron deficiency anemia.  Suspicion for worsening of his known severe right L4-5 and L5-S1 neural foraminal stenosis.  Suspicion for this to be secondary to myasthenia gravis exacerbation.  Plan: - Asked patient to purchase vitamin B12 1000 mg over-the-counter and take once daily -Labs: B12, CBC, ferritin, Iron and IBC, Magnesium  ADDENDUM 04/05/21: Vitamin B12, Magnesium within normal limits.  CBC, ferritin, and iron studies consistent with iron deficiency anemia.  ADDENDUM 04/06/21: Was not able to contact patient, but spoke with Daughter in law.  She stated that patient has not been contacted by PT services. On chart review, he has been referred to PT two times by [facility] and once for home health PT by his Neurologist on 03/22/21.  Home health company has not received referral from neurology yet.   -Will refer for home health PT

## 2021-04-03 LAB — VITAMIN B12: Vitamin B-12: 1239 pg/mL (ref 232–1245)

## 2021-04-03 LAB — CBC
Hematocrit: 36.1 % — ABNORMAL LOW (ref 37.5–51.0)
Hemoglobin: 11.8 g/dL — ABNORMAL LOW (ref 13.0–17.7)
MCH: 29.6 pg (ref 26.6–33.0)
MCHC: 32.7 g/dL (ref 31.5–35.7)
MCV: 91 fL (ref 79–97)
Platelets: 278 10*3/uL (ref 150–450)
RBC: 3.98 x10E6/uL — ABNORMAL LOW (ref 4.14–5.80)
RDW: 16.6 % — ABNORMAL HIGH (ref 11.6–15.4)
WBC: 8 10*3/uL (ref 3.4–10.8)

## 2021-04-03 LAB — FERRITIN: Ferritin: 43 ng/mL (ref 30–400)

## 2021-04-03 LAB — IRON AND TIBC
Iron Saturation: 10 % — ABNORMAL LOW (ref 15–55)
Iron: 37 ug/dL — ABNORMAL LOW (ref 38–169)
Total Iron Binding Capacity: 378 ug/dL (ref 250–450)
UIBC: 341 ug/dL (ref 111–343)

## 2021-04-03 LAB — MAGNESIUM: Magnesium: 2 mg/dL (ref 1.6–2.3)

## 2021-04-05 ENCOUNTER — Telehealth: Payer: Self-pay

## 2021-04-05 MED ORDER — FERROUS SULFATE 325 (65 FE) MG PO TABS: ORAL_TABLET | ORAL | 1 refills | Status: AC

## 2021-04-05 NOTE — Addendum Note (Signed)
Addended by: Edwyna Perfect on: 04/05/2021 01:55 PM   Modules accepted: Orders

## 2021-04-05 NOTE — Telephone Encounter (Signed)
Requesting lab results, please call pt back.  

## 2021-04-05 NOTE — Assessment & Plan Note (Addendum)
ADDENDUM 04/05/21:  Patient presents with normocytic anemia, hgb of 11.8, and iron deficiency pattern with Iron= 37 and Iron saturation at 10%.  On chart review, no documentation of colonoscopy in previous 10 years.    Plan: -Refilled Ferrous Sulfate 325 mg take every other day -referral for colonoscopy, [facility] procedure due to sigmoid perforation for diverticulitis in 12/21 with colostomy reversal in 08/2020.  Unsure of time needed for colon to heal before colonoscopy could be done.  He is currently 7 months out from colostomy reversal.  -tried to call patient via interpreter services, but unable to reach. Left voicemail via interpreter for patient to call clinic.  Will try again later.  ADDENDUM 04/06/21: Attempted to call patient using interpretive services, but unable to reach.  Spoke with step daughter and asked her to have patient call clinic.  Will go ahead and mail results at this time.

## 2021-04-06 ENCOUNTER — Encounter: Payer: Self-pay | Admitting: Internal Medicine

## 2021-04-06 NOTE — Addendum Note (Signed)
Addended by: Edwyna Perfect on: 04/06/2021 03:08 PM   Modules accepted: Orders

## 2021-04-06 NOTE — Progress Notes (Signed)
Internal Medicine Clinic Attending  I saw and evaluated the patient.  I personally confirmed the key portions of the history and exam documented by Dr. Masters and I reviewed pertinent patient test results.  The assessment, diagnosis, and plan were formulated together and I agree with the documentation in the resident's note.  

## 2021-04-29 ENCOUNTER — Ambulatory Visit (INDEPENDENT_AMBULATORY_CARE_PROVIDER_SITE_OTHER): Payer: Medicare Other | Admitting: Internal Medicine

## 2021-04-29 ENCOUNTER — Other Ambulatory Visit: Payer: Self-pay | Admitting: Neurology

## 2021-04-29 ENCOUNTER — Other Ambulatory Visit: Payer: Self-pay

## 2021-04-29 ENCOUNTER — Encounter: Payer: Self-pay | Admitting: Internal Medicine

## 2021-04-29 VITALS — BP 125/70 | HR 83 | Temp 98.5°F | Ht 65.0 in | Wt 137.6 lb

## 2021-04-29 DIAGNOSIS — E1169 Type 2 diabetes mellitus with other specified complication: Secondary | ICD-10-CM | POA: Diagnosis not present

## 2021-04-29 DIAGNOSIS — R7303 Prediabetes: Secondary | ICD-10-CM | POA: Diagnosis not present

## 2021-04-29 DIAGNOSIS — I959 Hypotension, unspecified: Secondary | ICD-10-CM | POA: Diagnosis not present

## 2021-04-29 DIAGNOSIS — D649 Anemia, unspecified: Secondary | ICD-10-CM

## 2021-04-29 DIAGNOSIS — I10 Essential (primary) hypertension: Secondary | ICD-10-CM | POA: Diagnosis not present

## 2021-04-29 DIAGNOSIS — K5900 Constipation, unspecified: Secondary | ICD-10-CM | POA: Diagnosis not present

## 2021-04-29 DIAGNOSIS — R5383 Other fatigue: Secondary | ICD-10-CM

## 2021-04-29 LAB — POCT GLYCOSYLATED HEMOGLOBIN (HGB A1C): Hemoglobin A1C: 6 % — AB (ref 4.0–5.6)

## 2021-04-29 LAB — GLUCOSE, CAPILLARY: Glucose-Capillary: 123 mg/dL — ABNORMAL HIGH (ref 70–99)

## 2021-04-29 MED ORDER — POLYETHYLENE GLYCOL 3350 17 G PO PACK: 17.0000 g | PACK | Freq: Every day | ORAL | 0 refills | Status: AC

## 2021-04-29 NOTE — Progress Notes (Signed)
Subjective:  CC: HTN  HPI:  Danny Walker is a 75 y.o. male with a past medical history stated below and presents today for HTN. Please see problem based assessment and plan for additional details.  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   DDD (degenerative disc disease), lumbosacral    Debility    Diverticulitis 05/01/2020   Encephalopathy acute    Endotracheally intubated    Fatty liver    Foley catheter in place    Hip pain 09/30/2019   History of gallstones    History of prostatitis    Hypertension    IDA (iron deficiency anemia)    Leg weakness 03/23/2020   Leg weakness, bilateral 03/23/2020   Liver mass 09/17/2001   4.7cm , noted on [place] abd   Low back pain    Myasthenia gravis ([place])    Myasthenia gravis with acute exacerbation ([place]) 01/24/2020   Myasthenia gravis, bulbar ([place]) 04/06/2018   Nocturia    Perforation of sigmoid colon due to diverticulitis    Peritonitis ([place])    Protein-calorie malnutrition, severe 04/06/2018   Renal cyst, 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   1.3 cm simple, noted on [place] ABD   Right shoulder pain 08/20/2019   Spondylosis Lumbar   Steroid-induced hyperglycemia    Superficial bruising of arm, right, initial encounter 01/23/4267   Umbilical hernia    Unintentional weight loss 04/03/2018   Urinary retention 02/08/2018   Visual disturbance 03/23/2020   Weakness of both legs 02/2020    Current Outpatient Medications on File Prior to Visit  Medication Sig Dispense Refill   Calcium Carb-Cholecalciferol ([employer]S OYSTER SHELL CALCIUM-VIT D) 500-3.125 MG-MCG TABS Take by mouth.     ferrous sulfate 325 (65 FE) MG tablet Tomar una tableta [facility] 90 tablet 1   finasteride (PROSCAR) 5 MG tablet TAKE 1 TABLET (5 MG TOTAL) BY MOUTH EVERY MORNING. (Patient not taking: No sig reported) 90 tablet  3   naproxen (NAPROSYN) 250 MG tablet TOME UNA TABLETA DOS VECES AL DIA CON ALIMENTO 28 tablet 2   predniSONE (DELTASONE) 10 MG tablet Take 20mg  daily. 90 tablet 5   pregabalin (LYRICA) 50 MG capsule TOME 1 CAPSULA POR VIA ORAL DOS VECES AL DIA DURANTE DOS SEMANAS, ENTONCES TRES VECES AL DIA 90 capsule 0   pyridostigmine (MESTINON) 60 MG tablet Take 1 tablet in the morning and half tablet at lunch. 135 tablet 4   traMADol (ULTRAM) 50 MG tablet Take 1 tablet (50 mg total) by mouth every 6 (six) hours as needed. 30 tablet 3   traZODone (DESYREL) 50 MG tablet TOME UNA TABLETA TODOS LOS DIAS AL ACOSTARSE 90 tablet 1   vitamin B-12 (CYANOCOBALAMIN) 1000 MCG tablet Take 1 tablet (1,000 mcg total) by mouth daily. 90 tablet 0   No current facility-administered medications on file prior to visit.    Family History  Problem Relation Age of Onset   Hypertension Mother    Hypertension Father     Social History   Socioeconomic History   Marital status: Widowed    Spouse name: Not on file   Number of children: 1   Years of education: 12   Highest education level: High school graduate  Occupational History   Occupation: Airline pilot: [employer]  Tobacco Use   Smoking status: Never   Smokeless tobacco: Never  Vaping Use   Vaping Use: Never used  Substance and Sexual Activity   Alcohol use: No   Drug use: Never   Sexual activity: Yes  Other Topics Concern   Not on file  Social History Narrative   ** Merged History Encounter **       Lives with daughter in a one story home.  Education: high school.  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   Right handed One story house Drinks little coffee   Social Determinants of Health   Financial Resource Strain: Not on file  Food Insecurity: Not on file  Transportation Needs: Not on file  Physical Activity: Not on file  Stress: Not on file  Social Connections: Not on file  Intimate Partner Violence: Not on file     Review of Systems: ROS negative except for what is noted on the assessment and plan.  Objective:   Vitals:   04/29/21 1354 04/29/21 1407 04/29/21 1445 04/29/21 1522  BP: (!) 90/53 (!) 88/54 (!) 99/58 125/70  Pulse: 100 94 75 83  Temp: 98.5 F (36.9 C)     TempSrc: Oral     SpO2: 97%     Weight: 137 lb 9.6 oz (62.4 kg)     Height: 5\' 5"  (1.651 m)       Physical Exam: Gen: A&O x3 and in no apparent distress, well appearing and nourished. HEENT:    Head - normocephalic, atraumatic.    Eye - visual acuity grossly intact, conjunctiva clear, sclera non-icteric, EOM intact.    Mouth - No obvious caries or periodontal disease. Neck: no masses or nodules, AROM intact. CV: RRR, no murmurs, S1/S2 presents  Resp: Clear to ascultation bilaterally  Abd: hyperactive BS (+) x4, soft, non-tender abdomen, distended without hepatosplenomegaly or masses MSK: Grossly normal AROM and strength x4 extremities. Skin: good skin turgor, no rashes, unusual bruising, or prominent lesions.  Neuro: No focal deficits, grossly normal sensation and coordination.     Assessment & Plan:  See Encounters Tab for problem based charting.  Patient discussed with Dr.  Lacretia Nicks, D.O. [facility]  PGY-3 Pager: [phone]  Phone: [phone] Date 04/30/2021  Time 5:01 PM

## 2021-04-29 NOTE — Patient Instructions (Signed)
Thank you, [name] for allowing [place] to provide your care today. Today we discussed DM.    Labs/Tests Ordered:  Lab Orders         Glucose, capillary         CMP14 + Anion Gap         Ferritin         CBC with Diff         TSH         POC Hbg A1C      Referrals Ordered:  Referral Orders  No referral(s) requested today     Medication Changes:  Medications Discontinued During This Encounter  Medication Reason   lisinopril-hydrochlorothiazide (ZESTORETIC) 20-12.5 MG tablet      Meds ordered this encounter  Medications   polyethylene glycol (MIRALAX) 17 g packet    Sig: Take 17 g by mouth daily.    Dispense:  14 each    Refill:  0     Health Maintenance Screening: There are no preventive care reminders to display for this patient.   Instructions:   Follow up:  1 week    Remember: If you have any questions or concerns, call our clinic at [phone] or after hours call [phone] and ask for the internal medicine resident on call.  Marianna Payment, D.O. [place]

## 2021-04-30 ENCOUNTER — Encounter: Payer: Self-pay | Admitting: Internal Medicine

## 2021-04-30 DIAGNOSIS — E119 Type 2 diabetes mellitus without complications: Secondary | ICD-10-CM | POA: Insufficient documentation

## 2021-04-30 LAB — CMP14 + ANION GAP
ALT: 25 IU/L (ref 0–44)
AST: 29 IU/L (ref 0–40)
Albumin/Globulin Ratio: 2 (ref 1.2–2.2)
Albumin: 4 g/dL (ref 3.7–4.7)
Alkaline Phosphatase: 75 IU/L (ref 44–121)
Anion Gap: 15 mmol/L (ref 10.0–18.0)
BUN/Creatinine Ratio: 24 (ref 10–24)
BUN: 33 mg/dL — ABNORMAL HIGH (ref 8–27)
Bilirubin Total: 0.3 mg/dL (ref 0.0–1.2)
CO2: 21 mmol/L (ref 20–29)
Calcium: 9.2 mg/dL (ref 8.6–10.2)
Chloride: 103 mmol/L (ref 96–106)
Creatinine, Ser: 1.38 mg/dL — ABNORMAL HIGH (ref 0.76–1.27)
Globulin, Total: 2 g/dL (ref 1.5–4.5)
Glucose: 107 mg/dL — ABNORMAL HIGH (ref 70–99)
Potassium: 4.3 mmol/L (ref 3.5–5.2)
Sodium: 139 mmol/L (ref 134–144)
Total Protein: 6 g/dL (ref 6.0–8.5)
eGFR: 54 mL/min/{1.73_m2} — ABNORMAL LOW (ref >59)

## 2021-04-30 LAB — CBC WITH DIFFERENTIAL/PLATELET
Basophils Absolute: 0 10*3/uL (ref 0.0–0.2)
Basos: 0 %
EOS (ABSOLUTE): 0 10*3/uL (ref 0.0–0.4)
Eos: 0 %
Hematocrit: 35.7 % — ABNORMAL LOW (ref 37.5–51.0)
Hemoglobin: 11.8 g/dL — ABNORMAL LOW (ref 13.0–17.7)
Immature Grans (Abs): 0.2 10*3/uL — ABNORMAL HIGH (ref 0.0–0.1)
Immature Granulocytes: 1 %
Lymphocytes Absolute: 0.9 10*3/uL (ref 0.7–3.1)
Lymphs: 8 %
MCH: 30.1 pg (ref 26.6–33.0)
MCHC: 33.1 g/dL (ref 31.5–35.7)
MCV: 91 fL (ref 79–97)
Monocytes Absolute: 0.5 10*3/uL (ref 0.1–0.9)
Monocytes: 5 %
Neutrophils Absolute: 9.3 10*3/uL — ABNORMAL HIGH (ref 1.4–7.0)
Neutrophils: 86 %
Platelets: 286 10*3/uL (ref 150–450)
RBC: 3.92 x10E6/uL — ABNORMAL LOW (ref 4.14–5.80)
RDW: 15.7 % — ABNORMAL HIGH (ref 11.6–15.4)
WBC: 10.9 10*3/uL — ABNORMAL HIGH (ref 3.4–10.8)

## 2021-04-30 LAB — TSH: TSH: 3.7 u[IU]/mL (ref 0.450–4.500)

## 2021-04-30 LAB — FERRITIN: Ferritin: 67 ng/mL (ref 30–400)

## 2021-04-30 NOTE — Assessment & Plan Note (Signed)
Patient has a history of diabetes and not on medication management.  Hemoglobin A1C Latest Ref Rng & Units 04/29/2021 12/01/2020 08/26/2020 05/02/2020  HGBA1C 4.0 - 5.6 % 6.0(A) 5.7(A) 6.4(H) 7.1(H)  Some recent data might be hidden    Plan: - Repeat A1c - address medication management at next visit.

## 2021-04-30 NOTE — Assessment & Plan Note (Addendum)
Patient presents with abdominal bloating and constipation. He recently had multiple surgeries due to diverticulitis including a colostomy reversal in 08/2020. He has since had problems with weight loss (15 lbs). He denies any significant abdominal pain, hematuria/melena, nausea, vomiting, fever, chills, or decreased appetite.  He states that he has a BM every 3rd day and occasionally will need to use a suppository to assist with BM.   On exam, patient has hyperactive bowel sounds, distended abdomen, but soft without tenderness.   Plan: Constipation: - I counseled him regarding the risk of constipation with recent abdominal surgery and gave him strict return precautions regarding pain, hematochezia, fever, or signs of bowel obstruction. - I counseled the patient regarding an Abd XR but he declined. - CBC, CMP today - Start miralax daily  - Increase fluid intake

## 2021-04-30 NOTE — Assessment & Plan Note (Addendum)
Patient presents for reevaluation of his BP. He is on lisinopril-HCTz. He is tolerating this without side effects. He did present with hypotensive blood pressures of 88/54. He states that he has not been drinking a lot of fluids lately. He has also lost 15 lbs in the last 3 months. He denies any weakness, dizziness, or orthostatic symptoms.   Patient was given fluids with improvement of his BP.  BP Readings from Last 3 Encounters:  04/29/21 125/70  04/01/21 126/70  03/22/21 116/71    Plan: - Hold BP medications for 1 week  - counseled regarding boost shakes and increased fluid intake - follow up in 1 week

## 2021-05-03 IMAGING — CR DG CHEST 2V
2 series · 2 of 2 positions shown · non-contrast
Comparison: Radiographs 01/24/2020 and 01/10/2020.  CT 04/07/2018.

CLINICAL DATA: Chest pain with lower extremity weakness for a few
days.

EXAM:
CHEST - 2 VIEW

[chest pa]
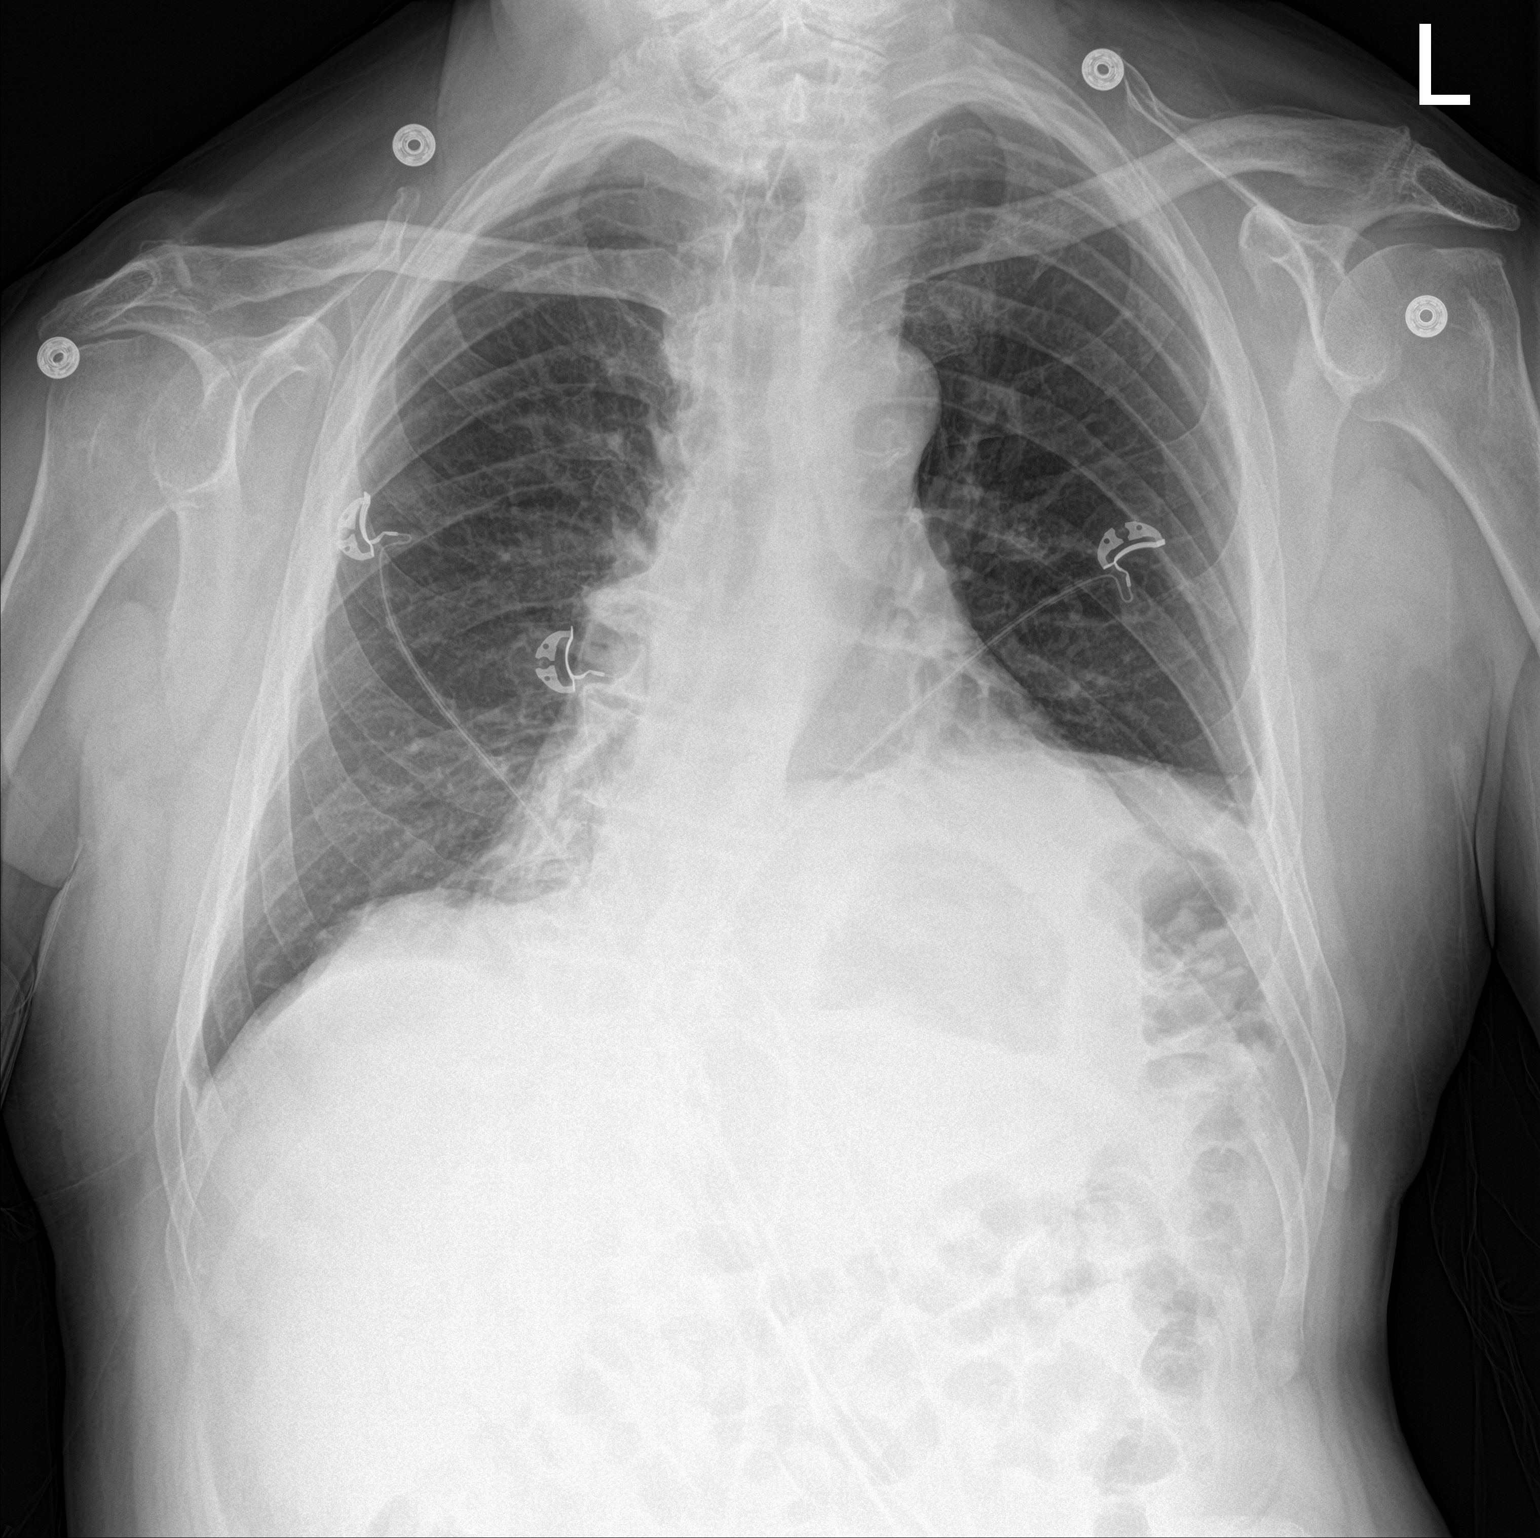

[chest lat]
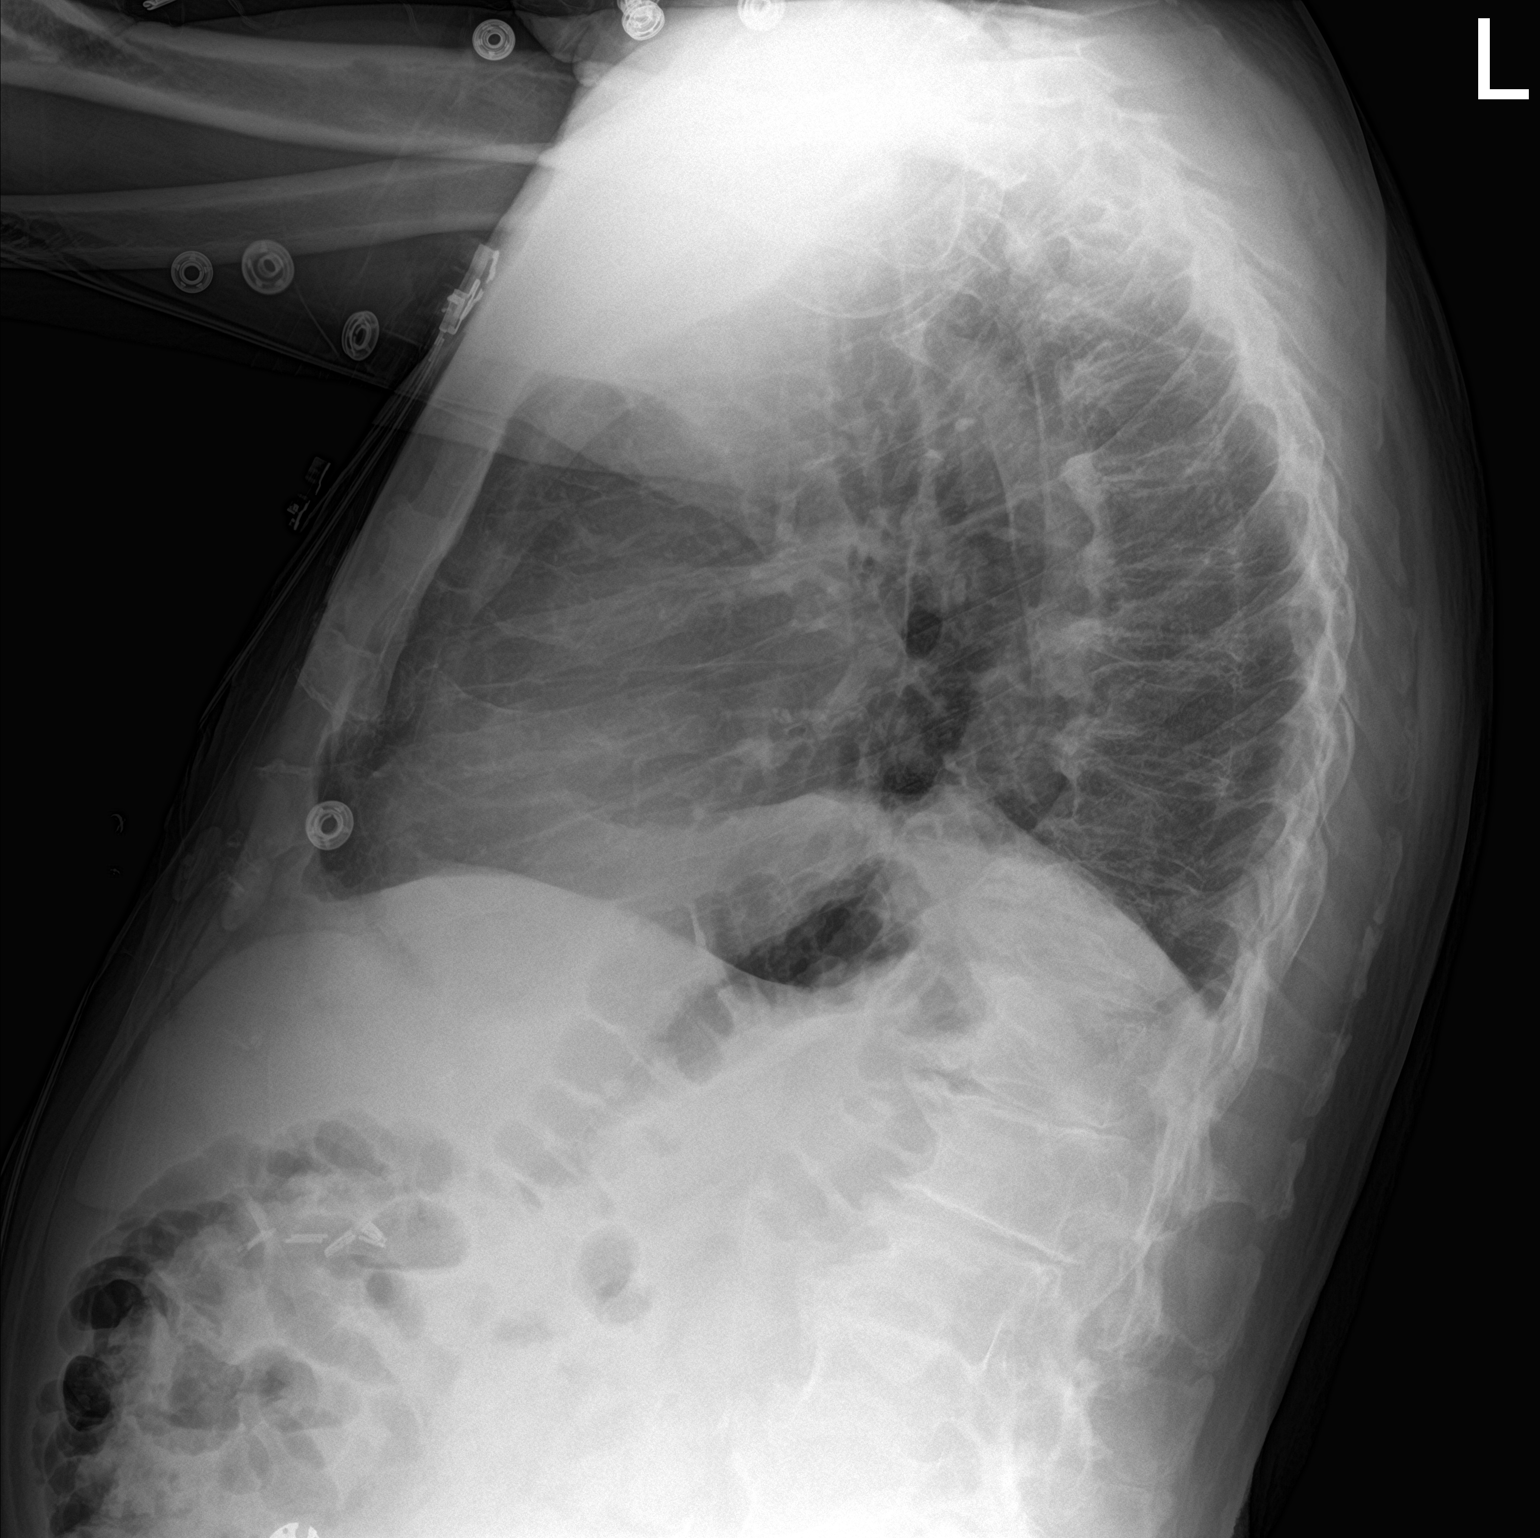

[2 of 2 positions shown; findings below may reference images not displayed]

FINDINGS: The heart size and mediastinal contours are stable with aortic
atherosclerosis. There is stable chronic elevation of the left
hemidiaphragm with associated chronic left basilar atelectasis. The
right lung is clear. There is no pleural effusion or pneumothorax.
Diffuse degenerative changes are present throughout the thoracic
spine. There is a convex right thoracolumbar scoliosis. No acute
osseous findings.
IMPRESSION: Stable chest. No acute cardiopulmonary process.

## 2021-05-03 NOTE — Progress Notes (Signed)
Internal Medicine Clinic Attending  Case discussed with Dr. Coe  At the time of the visit.  We reviewed the resident's history and exam and pertinent patient test results.  I agree with the assessment, diagnosis, and plan of care documented in the resident's note.  

## 2021-05-06 ENCOUNTER — Ambulatory Visit (INDEPENDENT_AMBULATORY_CARE_PROVIDER_SITE_OTHER): Payer: Medicare Other | Admitting: Internal Medicine

## 2021-05-06 ENCOUNTER — Encounter: Payer: Self-pay | Admitting: Internal Medicine

## 2021-05-06 ENCOUNTER — Other Ambulatory Visit: Payer: Self-pay

## 2021-05-06 VITALS — BP 122/74 | HR 78 | Temp 99.1°F | Ht 65.0 in | Wt 137.5 lb

## 2021-05-06 DIAGNOSIS — E1169 Type 2 diabetes mellitus with other specified complication: Secondary | ICD-10-CM | POA: Diagnosis not present

## 2021-05-06 DIAGNOSIS — F5101 Primary insomnia: Secondary | ICD-10-CM

## 2021-05-06 DIAGNOSIS — I10 Essential (primary) hypertension: Secondary | ICD-10-CM | POA: Diagnosis not present

## 2021-05-06 DIAGNOSIS — F419 Anxiety disorder, unspecified: Secondary | ICD-10-CM

## 2021-05-06 DIAGNOSIS — M2042 Other hammer toe(s) (acquired), left foot: Secondary | ICD-10-CM | POA: Diagnosis present

## 2021-05-06 IMAGING — DX DG CHEST 1V PORT
1 series · 1 of 1 positions shown · non-contrast
Comparison: 02/28/2020

CLINICAL DATA: Dizziness and weakness

EXAM:
PORTABLE CHEST 1 VIEW

[chest ap]
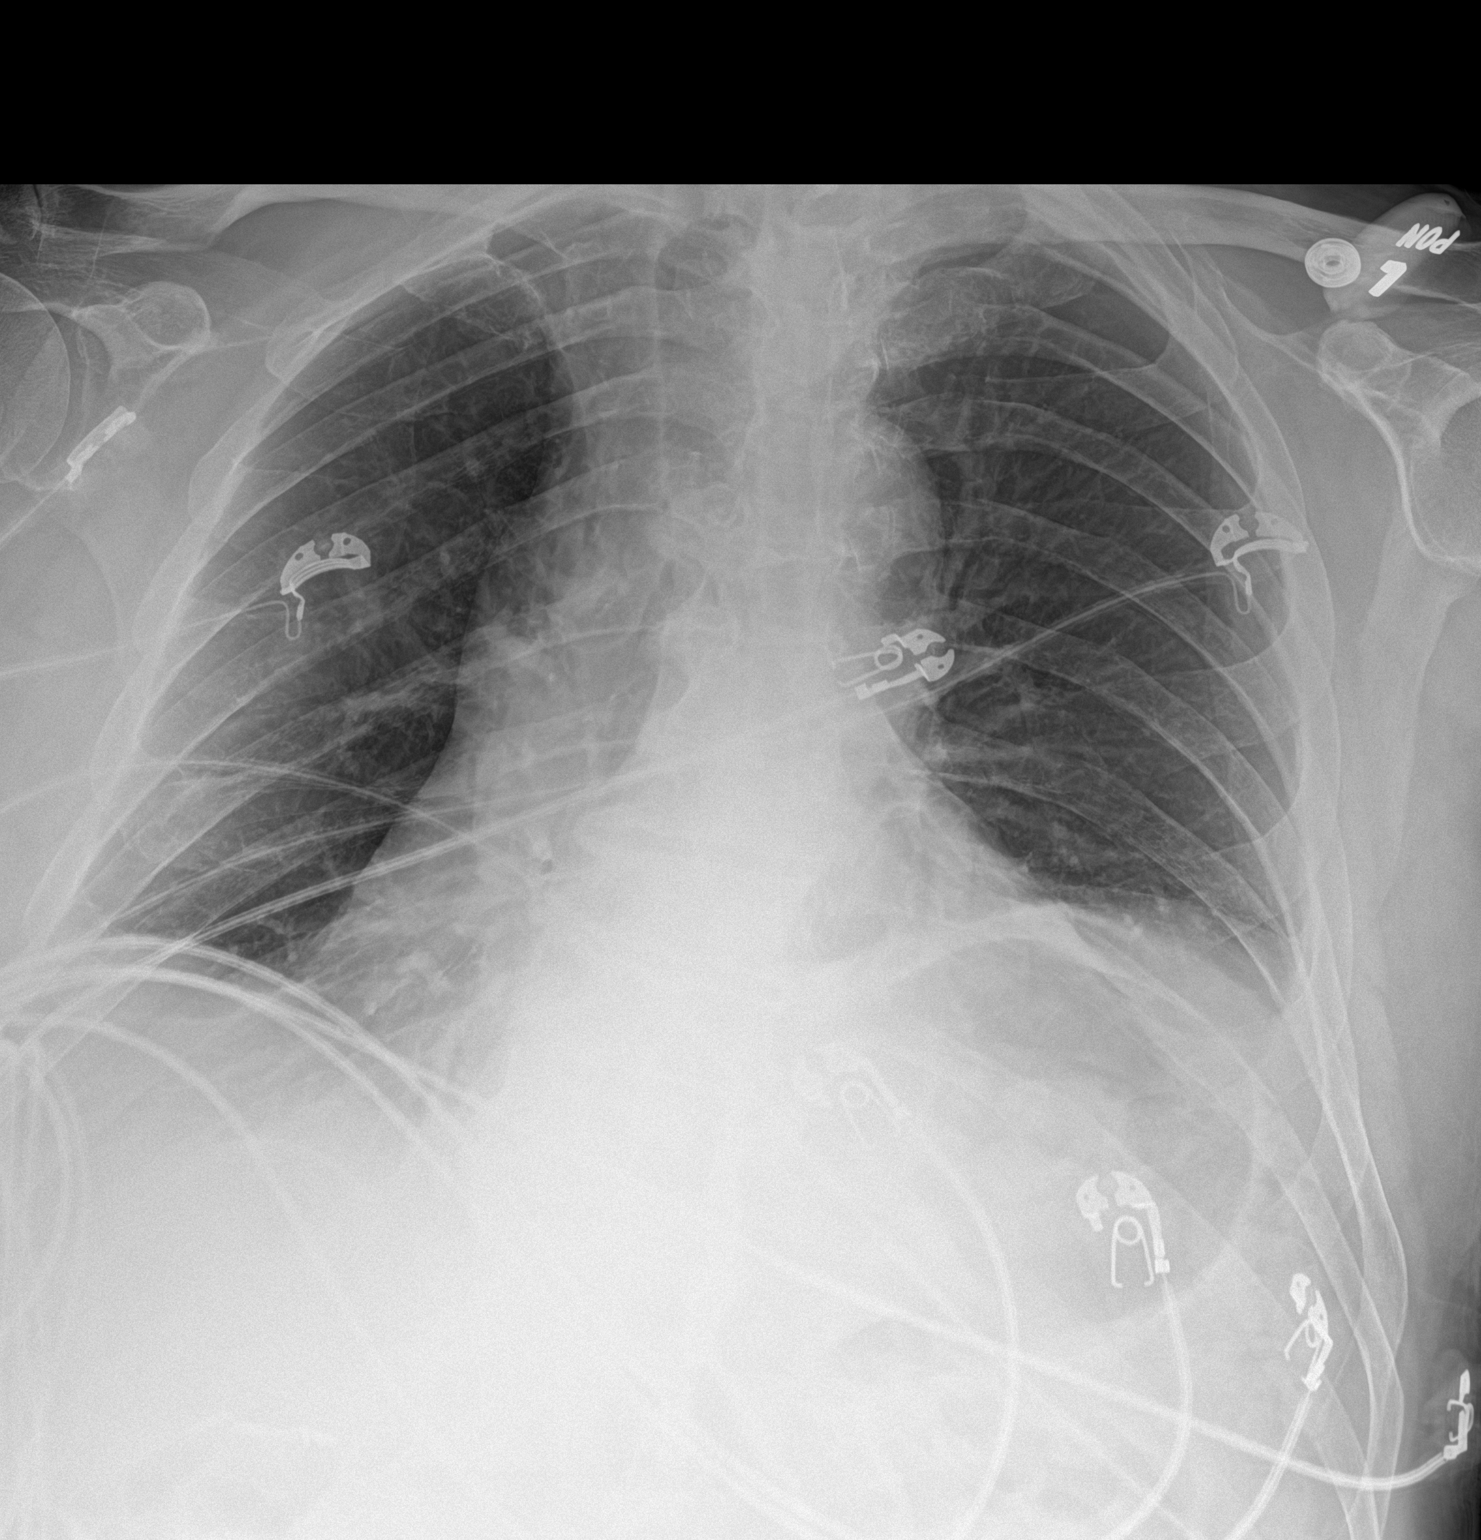

[1 of 1 positions shown; findings below may reference images not displayed]

FINDINGS: Mild elevation of the left hemidiaphragm is unchanged. The lungs are
clear. No pneumothorax or pleural effusion. Mild cardiomegaly is
likely stable when accounting for positional change. Pulmonary
vascularity is normal. No acute bone abnormality.
IMPRESSION: No active disease.  Stable cardiomegaly.

## 2021-05-06 NOTE — Patient Instructions (Addendum)
Thank you, [name] for allowing [place] to provide your care today. Today we discussed your diabetes and blood pressure.    1) Tu presion esta normal hoy. Continua una dieta saluda y ejercicio.  2) Tu nivel para tu diabetes esta bien. Continua una dieta saluda y ejercicio.  3) Te he referido a un oftalmlogo ya un consejero. Te llamarn para concertar una cita.   I have ordered the following labs for you:  Lab Orders         Microalbumin / Creatinine Urine Ratio      Tests ordered today:   Referrals ordered today:   Referral Orders         Ambulatory referral to Ophthalmology         Ambulatory referral to [place]       I have ordered the following medication/changed the following medications:   Stop the following medications: There are no discontinued medications.   Start the following medications: No orders of the defined types were placed in this encounter.    Follow up: Danny Walker   Should you have any questions or concerns please call the internal medicine clinic at [phone].

## 2021-05-06 NOTE — Assessment & Plan Note (Addendum)
A1c of 6 one week ago. Not currently on medication. Pt endorses healthy diet eating fish, vegetables, and cookies without salt. We emphasized the need to maintain a diet low in carbohydrates and sugar.  -Continue lifestyle changes -Foot exam performed today

## 2021-05-06 NOTE — Assessment & Plan Note (Signed)
GAD score of 10 today. See "insomnia" for further details.

## 2021-05-06 NOTE — Assessment & Plan Note (Addendum)
Pt endorses difficulty sleeping at night, stating that he will only sleep for about an hour a night. Per records, he has tried trazodone 50 mg but says this has not helped. Likely a component of anxiety to this. Pt has declined counseling in the past. Will perform GAD-7 today.  - GAD score of 10 today. Patient expressed interest in pursuing counseling. He requested a spanish-speaking counselor to come to his house but unsure what he meant by this (patient was very difficult to understand, even with the interpreter present). However, placed referral to Dr. Carolynne Edouard today given his expressed interest in pursuing this.

## 2021-05-06 NOTE — Assessment & Plan Note (Addendum)
BP Readings from Last 3 Encounters:  05/06/21 122/74  04/29/21 125/70  04/01/21 126/70   BP WNL today off of his BP meds.   -Continue lifestyle modifications

## 2021-05-06 NOTE — Progress Notes (Signed)
° °  CC: Blood pressure check  HPI:  [name] is a 75 y.o. with past medical history as noted below who presents to the clinic today for a blood pressure check. Please see problem-based list for further details, assessments, and plans.  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   DDD (degenerative disc disease), lumbosacral    Debility    Diverticulitis 05/01/2020   Encephalopathy acute    Endotracheally intubated    Fatty liver    Foley catheter in place    Hip pain 09/30/2019   History of gallstones    History of prostatitis    Hypertension    IDA (iron deficiency anemia)    Leg weakness 03/23/2020   Leg weakness, bilateral 03/23/2020   Liver mass 09/17/2001   4.7cm , noted on [place] abd   Low back pain    Myasthenia gravis ([place])    Myasthenia gravis with acute exacerbation ([place]) 01/24/2020   Myasthenia gravis, bulbar ([place]) 04/06/2018   Nocturia    Perforation of sigmoid colon due to diverticulitis    Peritonitis ([place])    Protein-calorie malnutrition, severe 04/06/2018   Renal cyst, 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  Physical Exam:  Vitals:   05/06/21 1410  BP: 122/74  Pulse: 78  Temp: 99.1 F (37.3 C)  TempSrc: Oral  SpO2: 97%  Weight: 137 lb 8 oz (62.4 kg)  Height: 5\' 5"  (1.651 m)   Physical Exam Constitutional:      General: He is not in acute distress.    Appearance: Normal  appearance.  HENT:     Head: Normocephalic and atraumatic.     Mouth/Throat:     Mouth: Mucous membranes are moist.  Cardiovascular:     Rate and Rhythm: Normal rate and regular rhythm.     Heart sounds: No murmur heard.   No friction rub. No gallop.  Pulmonary:     Effort: Pulmonary effort is normal.     Breath sounds: Normal breath sounds.  Abdominal:     General: Abdomen is flat. There is no distension.  Musculoskeletal:        General: Normal range of motion.  Skin:    General: Skin is warm.  Neurological:     General: No focal deficit present.     Mental Status: He is alert and oriented to person, place, and time. Mental status is at baseline.  Psychiatric:        Mood and Affect: Mood normal.        Behavior: Behavior normal.     Assessment & Plan:   See Encounters Tab for problem based charting.  Patient seen with Dr. Philipp Ovens

## 2021-05-07 LAB — MICROALBUMIN / CREATININE URINE RATIO
Creatinine, Urine: 36.2 mg/dL
Microalb/Creat Ratio: 111 mg/g creat — ABNORMAL HIGH (ref 0–29)
Microalbumin, Urine: 40.3 ug/mL

## 2021-05-08 ENCOUNTER — Other Ambulatory Visit: Payer: Self-pay | Admitting: Internal Medicine

## 2021-05-08 DIAGNOSIS — K219 Gastro-esophageal reflux disease without esophagitis: Secondary | ICD-10-CM

## 2021-05-10 NOTE — Progress Notes (Signed)
Internal Medicine Clinic Attending  I saw and evaluated the patient.  I personally confirmed the key portions of the history and exam documented by Dr. Lorin Glass and I reviewed pertinent patient test results.  The assessment, diagnosis, and plan were formulated together and I agree with the documentation in the residents note.   Podiatry referral placed for onychomycosis, hammer toe and bunion deformity in the setting of his DM.

## 2021-05-18 ENCOUNTER — Ambulatory Visit (INDEPENDENT_AMBULATORY_CARE_PROVIDER_SITE_OTHER): Payer: Self-pay | Admitting: Podiatry

## 2021-05-18 DIAGNOSIS — Z91199 Patient's noncompliance with other medical treatment and regimen due to unspecified reason: Secondary | ICD-10-CM

## 2021-05-25 NOTE — Progress Notes (Signed)
Patient was no-show for appointment today 

## 2021-06-09 ENCOUNTER — Other Ambulatory Visit: Payer: Self-pay

## 2021-06-09 DIAGNOSIS — M545 Low back pain, unspecified: Secondary | ICD-10-CM

## 2021-06-09 MED ORDER — TRAMADOL HCL 50 MG PO TABS: 50.0000 mg | ORAL_TABLET | Freq: Four times a day (QID) | ORAL | 1 refills | Status: DC | PRN

## 2021-06-15 ENCOUNTER — Institutional Professional Consult (permissible substitution): Payer: Medicare Other | Admitting: Behavioral Health

## 2021-06-22 ENCOUNTER — Ambulatory Visit: Payer: Medicare Other | Admitting: Behavioral Health

## 2021-06-22 DIAGNOSIS — F331 Major depressive disorder, recurrent, moderate: Secondary | ICD-10-CM

## 2021-06-22 DIAGNOSIS — F419 Anxiety disorder, unspecified: Secondary | ICD-10-CM

## 2021-06-22 DIAGNOSIS — F4381 Prolonged grief disorder: Secondary | ICD-10-CM

## 2021-06-22 DIAGNOSIS — F4329 Adjustment disorder with other symptoms: Secondary | ICD-10-CM

## 2021-06-22 NOTE — BH Specialist Note (Signed)
Integrated Behavioral Health Initial In-Person Visit  MRN: [id number] Name: Danny Walker  Number of [place] Clinician visits:: 1/6 Session Start time: 11:00am  Session End time: 11:50am Total time: 50  minutes  Types of Service: Individual psychotherapy  Interpretor:Yes.   Interpretor Name and Language: Danny Walker w/Interpreter Access Proj @ [facility]   Warm Hand Off Completed.        Subjective: Danny Walker is a 76 y.o. male accompanied by  Interpreter Danny Walker Patient was referred by Dr. Delynn Flavin, MD for mental well-being & adjustment to health status changes. Patient reports the following symptoms/concerns: back pain that has arisen in the past 2 mos, sleep issues; specifically only 3 hr/night from 12p-3a. Pt feels his abd in still swollen even though 4-5 mos ago he had abdominal surgery due to a blockage. Pt is not aware if/how much of his bowel was resected. Pt has worked @ the [employer] for 17 yrs. His back pain is bothering him @ work bc he stands a lot. He is not lifting per the Surgeon's request. Duration of problem: a few mos; Severity of problem: moderate  Objective: Mood: Anxious and Depressed and Affect: Appropriate Risk of harm to self or others: No plan to harm self or others  Life Context: Family and Social: Pt lives alone & 6 yrs ago lost his Wife of 52+ yrs. Pt has a StepDtr who lives 2 blocks away & assists him minimally. School/Work: Pt works @ [employer] in [employer]; he does not [employer]. Self-Care: Pt is diligent about Physician appt. Pt lives 10 min from [facility] & normally drives himself to appts Life Changes: Pt has encountered many health status changes in the past 6 mos  Patient and/or Family's Strengths/Protective Factors: Concrete supports in place (healthy food, safe environments, etc.) and Patient is resilient  Goals Addressed: Patient will: Reduce symptoms of: stress and grief due to the loss of  his Wife Danny Walker @ the age of 76yo Increase knowledge and/or ability of: coping skills and grief processing   Demonstrate ability to: Increase healthy adjustment to current life circumstances  Progress towards Goals: Estb'd today; Pt agrees to future visits & was appreciative of Clinician's time.   Interventions: Interventions utilized: Supportive Counseling  Standardized Assessments completed:  screeners prn   Patient and/or Family Response: Pt receptive & dependent upon assistance of the Interpreter Danny Walker. Pt agrees to future appt  Patient Centered Plan: Patient is on the following Treatment Plan(s):  Pt will be given opportunity to process grief over his Wife's death & to voice his current health concerns.  Assessment: Patient currently experiencing delayed grief reaction & adjustment to health status issues espl'y his eye concerns. Pt is lonely & expressed tearfulness in session.    Patient may benefit from cont'd attendance in psychotherapy for mental health wellness.  Plan: Follow up with behavioral health clinician on : 2-3 wks f:f for 60 min Behavioral recommendations: Make f/u appt w/the Surgeon to check your abdomen. Make appt @ [facility] for second opinion. Referral(s): [place] (In Clinic) "From scale of 1-10, how likely are you to follow plan?": [place], LMFT

## 2021-06-25 ENCOUNTER — Other Ambulatory Visit: Payer: Self-pay

## 2021-06-25 ENCOUNTER — Encounter: Payer: Self-pay | Admitting: Neurology

## 2021-06-25 ENCOUNTER — Ambulatory Visit (INDEPENDENT_AMBULATORY_CARE_PROVIDER_SITE_OTHER): Payer: Medicare Other | Admitting: Neurology

## 2021-06-25 VITALS — BP 164/83 | HR 68 | Ht 65.0 in | Wt 141.0 lb

## 2021-06-25 DIAGNOSIS — G7000 Myasthenia gravis without (acute) exacerbation: Secondary | ICD-10-CM

## 2021-06-25 MED ORDER — PYRIDOSTIGMINE BROMIDE 60 MG PO TABS: ORAL_TABLET | ORAL | 3 refills | Status: AC

## 2021-06-25 MED ORDER — OMEPRAZOLE 40 MG PO CPDR: 40.0000 mg | DELAYED_RELEASE_CAPSULE | Freq: Every day | ORAL | 3 refills | Status: AC

## 2021-06-25 NOTE — Progress Notes (Signed)
Follow-up Visit   Date: 06/25/21   Danny Walker MRN: [id number] DOB: 12/17/1945   Interim History: Danny Walker is a 76 y.o. Spanish-speaking male from [place] with history of hypertension, prostatitis, liver mass, urinary retention, diverticulitis s/p sigmoid resection returning to the clinic for follow-up of seropositive myasthenia gravis (diagnosed 2019).  The patient was accompanied to the clinic by Spanish interpretor.  History of present illness: Patient was diagnosed with seroposotive myasthenia gravis in 2019 after being admitted to [facility] with dysphagia, dysarthria, diplopia and having robust response to mestinon. He required NG tube, no intubation. CT chest negative for thymoma.  His prednisone was gradually increased to 60mg /d and mestinon 60mg  QID. Prednisone has been tapered and he has been on 10mg  since August 2020.   Due to leg cramps, his mestinon was reduced to 30mg  BID which has helped.  Patient was doing well until July 2021 when he began having bulbar weakness, so prednisone was increased to 40mg /d.    In 2021, he had multiple ER visits, at one visit there was concern for STEMI, but patient left AMA without recommended cardiac work-up.  He returned to ER two days later with dizziness/lightheadedness and suffered a fall in the bathtub.  Orthostatic were positive.  CT head negative and again, no evidence of MG exacerbation.   He complains of weakness in the legs.     UPDATE 12/31/2020:    He was last seen in the office in November 2021.  Since then he was hospitalized with diverticulitis complicated by septic shock requiring sigmoid resection s/p colostomy followed by reversal.  He has several ER visits in 2022 for bleeding from his surgical site.  He was last seen in November and given a tapering prednisone dose (40mg  > 30mg ) and lost to follow-up since then.  He tells me that he has been taking prednisone 35mg /d and mestinon 30mg  three times daily.    UPDATE 03/22/2021:  He was able to reduce prednisone down to 20mg  daily and denies any new weakness, such as droopiness of the eyes, difficulty swallowing/talking, or double vision.  He has reduced his hours working at [employer] to 4 hours on Saturday and Sunday only.  He reports having imbalance and sensation of weakness in the legs.  He uses a cane for balance, only as needed.  MRI lumbar spine from 2021 did not show canal stenosis, there was right foraminal narrowing at L4-5 and L5-S1.  He denies radicular leg pain.   UPDATE 06/25/2021: He is doing well today, and denies any new difficulty with swallowing, diplopia, or limb weakness.  He is taking prednisone 20 mg and Mestinon 60 mg twice a day.  He continues to have sensation of imbalanceand uses a cane only as needed. No new complaints.  Medications:  Current Outpatient Medications on File Prior to Visit  Medication Sig Dispense Refill   Calcium Carb-Cholecalciferol ([employer] OYSTER SHELL CALCIUM-VIT D) 500-3.125 MG-MCG TABS Take by mouth.     ferrous sulfate 325 (65 FE) MG tablet Tomar una tableta [facility] 90 tablet 1   naproxen (NAPROSYN) 250 MG tablet TOME UNA TABLETA DOS VECES AL DIA CON ALIMENTO 28 tablet 2   omeprazole (PRILOSEC) 40 MG capsule Take 40 mg by mouth daily.     oxybutynin (DITROPAN) 5 MG tablet Take 5 mg by mouth 3 (three) times daily.     polyethylene glycol (MIRALAX) 17 g packet Take 17 g by mouth daily. 14 each 0   predniSONE (  DELTASONE) 10 MG tablet Take 20mg  daily. (Patient taking differently: 10 mg. Take 10mg  daily) 90 tablet 5   pregabalin (LYRICA) 50 MG capsule TOME 1 CAPSULA POR VIA ORAL DOS VECES AL DIA DURANTE DOS SEMANAS, ENTONCES TRES VECES AL DIA 90 capsule 0   pyridostigmine (MESTINON) 60 MG tablet Take 1 tablet in the morning and half tablet at lunch. 135 tablet 4   traMADol (ULTRAM) 50 MG tablet Take 1 tablet (50 mg total) by mouth every 6 (six) hours as needed. 30 tablet 1   traZODone (DESYREL) 50 MG tablet TOME  UNA TABLETA TODOS LOS DIAS AL ACOSTARSE 90 tablet 1   vitamin B-12 (CYANOCOBALAMIN) 1000 MCG tablet Take 1 tablet (1,000 mcg total) by mouth daily. 90 tablet 0   No current facility-administered medications on file prior to visit.    Allergies: No Known Allergies  Vital Signs:  BP (!) 164/83    Pulse 68    Ht 5\' 5"  (1.651 m)    Wt 141 lb (64 kg)    SpO2 98%    BMI 23.46 kg/m   Neurological Exam: MENTAL STATUS including orientation to time, place, person, recent and remote memory, attention span and concentration, language, and fund of knowledge is normal.  Speech is clear, no dysarthria.   CRANIAL NERVES:  Pupils are round and reactive. Normal conjugate, extra-ocular eye movements in all directions of gaze.  No ptosis at rest or with sustained upgaze. No bulbar weakness. Facial muscles are 5/5.   MOTOR:  Motor strength is 5/5 in all extremities, including neck flexion and hip flexors.   MSRs:                                           Right        Left brachioradialis 2+  2+  biceps 2+  2+  triceps 2+  2+  patellar 1+  2+  ankle jerk 2+  2+   COORDINATION/GAIT:   Gait wide-based and stable. Unable to stand up without using arms to push off.   Data: MRI brain wo contrast 04/03/2018: 1. Discontinued examination due to patient's cessation of choking. 2. No acute intracranial abnormality. Chronic small vessel ischemia.  CT chest w contrast 04/07/2018: No evidence of anterior mediastinal mass to suggest thymoma. No acute cardiopulmonary disease.  Labs 04/04/2018:  AChR binding 7.86*, blocking 65*, modulating 57*, TSH 1.77, CK 75, HIV neg, RPR neg  Lab Results  Component Value Date   CKTOTAL 51 04/20/2020   Lab Results  Component Value Date   HGBA1C 6.0 (A) 04/29/2021   Lab Results  Component Value Date   VITAMINB12 1,239 04/01/2021   MRI lumbar spine 05/05/2021: 1. No acute abnormality of the thoracic spine. No spinal canal or neural foraminal stenosis. 2. Lumbar  dextroscoliosis with apex at L3. 3. 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 4. Multilevel lateral recess narrowing without central spinal canal stenosis.  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  Exam is stable without evidence of bulbar or limb weakness.  - Continue prednisone 20mg  daily  - Continue mestinon 60mg  twice daily  2.  Bilateral leg weakness (subjective), no evidence of weakness on exam.  Clinically, unchanged.  MRI lumbar spine does not show central canal stenosis, there is right foraminal stenosis at L4-5 and L5-S1, however, he denies radicular pain.  Return  to clinic 2 months, or sooner as needed   Thank you for allowing me to participate in patient's care.  If I can answer any additional questions, I would be pleased to do so.    Sincerely,    Ijeoma Loor K. Posey Pronto, DO

## 2021-06-25 NOTE — Patient Instructions (Signed)
Return to clinic in April

## 2021-07-15 ENCOUNTER — Encounter (HOSPITAL_COMMUNITY): Payer: Self-pay

## 2021-07-15 ENCOUNTER — Emergency Department (HOSPITAL_COMMUNITY): Payer: Medicare Other

## 2021-07-15 ENCOUNTER — Other Ambulatory Visit: Payer: Self-pay

## 2021-07-15 ENCOUNTER — Emergency Department (HOSPITAL_COMMUNITY)
Admission: EM | Admit: 2021-07-15 | Discharge: 2021-07-15 | Disposition: A | Discharge status: Home / Self Care | Payer: Medicare Other | Attending: Emergency Medicine | Admitting: Emergency Medicine

## 2021-07-15 DIAGNOSIS — Z7952 Long term (current) use of systemic steroids: Secondary | ICD-10-CM | POA: Diagnosis not present

## 2021-07-15 DIAGNOSIS — G7000 Myasthenia gravis without (acute) exacerbation: Secondary | ICD-10-CM | POA: Diagnosis not present

## 2021-07-15 DIAGNOSIS — Z79899 Other long term (current) drug therapy: Secondary | ICD-10-CM | POA: Insufficient documentation

## 2021-07-15 DIAGNOSIS — R1030 Lower abdominal pain, unspecified: Secondary | ICD-10-CM | POA: Diagnosis present

## 2021-07-15 DIAGNOSIS — N419 Inflammatory disease of prostate, unspecified: Secondary | ICD-10-CM | POA: Diagnosis not present

## 2021-07-15 DIAGNOSIS — R32 Unspecified urinary incontinence: Secondary | ICD-10-CM | POA: Diagnosis not present

## 2021-07-15 DIAGNOSIS — N12 Tubulo-interstitial nephritis, not specified as acute or chronic: Secondary | ICD-10-CM

## 2021-07-15 DIAGNOSIS — M6281 Muscle weakness (generalized): Secondary | ICD-10-CM | POA: Diagnosis not present

## 2021-07-15 DIAGNOSIS — R160 Hepatomegaly, not elsewhere classified: Secondary | ICD-10-CM | POA: Insufficient documentation

## 2021-07-15 DIAGNOSIS — R1031 Right lower quadrant pain: Secondary | ICD-10-CM | POA: Diagnosis not present

## 2021-07-15 DIAGNOSIS — I10 Essential (primary) hypertension: Secondary | ICD-10-CM | POA: Diagnosis not present

## 2021-07-15 LAB — CBC WITH DIFFERENTIAL/PLATELET
Abs Immature Granulocytes: 0.08 10*3/uL — ABNORMAL HIGH (ref 0.00–0.07)
Basophils Absolute: 0 10*3/uL (ref 0.0–0.1)
Basophils Relative: 0 %
Eosinophils Absolute: 0 10*3/uL (ref 0.0–0.5)
Eosinophils Relative: 0 %
HCT: 37.1 % — ABNORMAL LOW (ref 39.0–52.0)
Hemoglobin: 11.2 g/dL — ABNORMAL LOW (ref 13.0–17.0)
Immature Granulocytes: 1 %
Lymphocytes Relative: 20 %
Lymphs Abs: 2.5 10*3/uL (ref 0.7–4.0)
MCH: 27.7 pg (ref 26.0–34.0)
MCHC: 30.2 g/dL (ref 30.0–36.0)
MCV: 91.8 fL (ref 80.0–100.0)
Monocytes Absolute: 1 10*3/uL (ref 0.1–1.0)
Monocytes Relative: 8 %
Neutro Abs: 9.1 10*3/uL — ABNORMAL HIGH (ref 1.7–7.7)
Neutrophils Relative %: 71 %
Platelets: 262 10*3/uL (ref 150–400)
RBC: 4.04 MIL/uL — ABNORMAL LOW (ref 4.22–5.81)
RDW: 14.7 % (ref 11.5–15.5)
WBC: 12.7 10*3/uL — ABNORMAL HIGH (ref 4.0–10.5)
nRBC: 0 % (ref 0.0–0.2)

## 2021-07-15 LAB — COMPREHENSIVE METABOLIC PANEL
ALT: 27 U/L (ref 0–44)
AST: 23 U/L (ref 15–41)
Albumin: 3.2 g/dL — ABNORMAL LOW (ref 3.5–5.0)
Alkaline Phosphatase: 92 U/L (ref 38–126)
Anion gap: 7 (ref 5–15)
BUN: 21 mg/dL (ref 8–23)
CO2: 26 mmol/L (ref 22–32)
Calcium: 8.8 mg/dL — ABNORMAL LOW (ref 8.9–10.3)
Chloride: 104 mmol/L (ref 98–111)
Creatinine, Ser: 0.87 mg/dL (ref 0.61–1.24)
GFR, Estimated: >60 mL/min (ref >60)
Glucose, Bld: 87 mg/dL (ref 70–99)
Potassium: 3.8 mmol/L (ref 3.5–5.1)
Sodium: 137 mmol/L (ref 135–145)
Total Bilirubin: 0.6 mg/dL (ref 0.3–1.2)
Total Protein: 6.2 g/dL — ABNORMAL LOW (ref 6.5–8.1)

## 2021-07-15 LAB — URINALYSIS, ROUTINE W REFLEX MICROSCOPIC
Bilirubin Urine: NEGATIVE
Glucose, UA: NEGATIVE mg/dL
Ketones, ur: NEGATIVE mg/dL
Nitrite: POSITIVE — AB
Protein, ur: NEGATIVE mg/dL
Specific Gravity, Urine: 1.01 (ref 1.005–1.030)
WBC, UA: >50 WBC/hpf — ABNORMAL HIGH (ref 0–5)
pH: 8 (ref 5.0–8.0)

## 2021-07-15 LAB — LIPASE, BLOOD: Lipase: 55 U/L — ABNORMAL HIGH (ref 11–51)

## 2021-07-15 MED ORDER — IOHEXOL 350 MG/ML SOLN
80.0000 mL | Freq: Once | INTRAVENOUS | Status: AC | PRN
  Administered 2021-07-15: 80 mL via INTRAVENOUS

## 2021-07-15 MED ORDER — CEFDINIR 300 MG PO CAPS: 300.0000 mg | ORAL_CAPSULE | Freq: Two times a day (BID) | ORAL | 0 refills | Status: AC

## 2021-07-15 MED ORDER — CEPHALEXIN 500 MG PO CAPS: 500.0000 mg | ORAL_CAPSULE | Freq: Two times a day (BID) | ORAL | 0 refills | Status: DC

## 2021-07-15 MED ORDER — IOHEXOL 300 MG/ML  SOLN: 100.0000 mL | Freq: Once | INTRAMUSCULAR | Status: DC | PRN

## 2021-07-15 MED ORDER — SODIUM CHLORIDE 0.9 % IV SOLN
1.0000 g | Freq: Once | INTRAVENOUS | Status: AC
  Administered 2021-07-15: 1 g via INTRAVENOUS
  Filled 2021-07-15: qty 10

## 2021-07-15 NOTE — ED Notes (Signed)
Patient ambulated in hallway using a walker without any help. Patient states that he is feeling better than yesterday with walking.

## 2021-07-15 NOTE — ED Provider Notes (Signed)
°  Face-to-face evaluation   History: He presents for evaluation of right-sided pain that started today.  He lives alone and called EMS.  His pain is better when I evaluated him at 11:55 AM.  Daughter here at this time and translates for me.  She states that his pain is better.  He does not have a known history of lumbar compression fracture.  Physical exam: Elderly alert and cooperative.  He is able to move on the bed without significant pain.  MDM: Evaluation for  Chief Complaint  Patient presents with   Abdominal Pain     Alert old male presenting with nonspecific right-sided pain and has findings concerning for UTI as source of pain.  He has incidental mild compression fractures of lumbar spine.  No recent trauma or injuries are known.  He does not have signs or symptoms of sepsis or electrolyte disorder.  He is stable for management as an outpatient.  Medical screening examination/treatment/procedure(s) were conducted as a shared visit with non-physician practitioner(s) and myself.  I personally evaluated the patient during the encounter    Daleen Bo, MD 07/15/21 1515

## 2021-07-15 NOTE — ED Provider Notes (Signed)
Physical Exam  BP 126/76    Pulse 88    Temp 98.7 F (37.1 C) (Oral)    Resp 16    SpO2 97%   Physical Exam Vitals and nursing note reviewed.  Constitutional:      Appearance: He is well-developed. He is ill-appearing.  HENT:     Head: Normocephalic and atraumatic.  Cardiovascular:     Rate and Rhythm: Normal rate.  Abdominal:     General: Abdomen is flat. Bowel sounds are normal.     Palpations: Abdomen is soft.     Tenderness: There is abdominal tenderness. There is right CVA tenderness.  Skin:    General: Skin is warm and dry.  Neurological:     Mental Status: He is alert and oriented to person, place, and time.    Procedures  Procedures  ED Course / MDM   Clinical Course as of 07/15/21 1138  Thu Jul 15, 2021  0817 WBC, UA(!): >50 [JS]  0817 Nitrite(!): POSITIVE [JS]  1008 Hgb urine dipstickMarland Kitchen): MODERATE [JS]    Clinical Course User Index [JS] Janeece Fitting, PA-C   Medical Decision Making Amount and/or Complexity of Data Reviewed Labs: ordered. Decision-making details documented in ED Course. Radiology: ordered.  Risk Prescription drug management.   Patient care assumed from Sylvarena J PA at shift change, please see his note for full HPI.  Briefly patient arrived here via EMS for concerns of right flank pain and abdominal pain.  Prior history of surgical intervention and myasthenia gravis.  Closely followed by neurology.  On my evaluation patient does report severe pain along the right flank, felt like he could not walk, there was some bilateral leg weakness, family she was concerned for some exacerbation of his myasthenia gravis.  He was afebrile at home, was bladder scanned with 90 mL in his bladder, was able to void after evaluation.   Interpretation of his labs by me reveal a CBC with a leukocytosis of 12.7, hemoglobin is within normal limits.  CMP without any electrolyte derangement, creatinine level is within normal limits.  LFTs are unremarkable.  Lipase level  slightly elevated at 55.  UA positive with nitrites, greater than 50 white blood cell count suspicious for cystitis at this time with hemoglobin.  Prior history of surgical intervention CT was obtained to rule out any further obstruction as patient reports severe pain.  CT Abdomen pelvis showed:  Focal stranding along the distal right ureter, suspicious for  urinary tract infection. Prior TURP. Correlate with urinalysis. No  obstructive uropathy.     New compression fractures of T11 and L4 with up to 35% and 20%  height loss. The L4 fracture appears acute. The T11 fracture appears  acute-subacute.         These results were discussed with patient along with stepdaughter at the bedside.  He was given IV Rocephin at this time.  According to his chart, last saw neurology Dr. Posey Pronto is currently taking prednisone 20 mg and Mestinon 60 mg twice a day.  I will call neurology in order to obtain further recommendations for his myasthenia gravis worsening due to new infection.  11:35 AM spoke to neurology Dr. Leonel Ramsay, who reviewed chart.  He recommended an NIF, vital capacity.  Patient was able to ambulate with nursing staff in his assisted device such as a walker.  He is reporting some improvement in symptoms after receiving some IV Rocephin.  I discussed with stepdaughter at the bedside that he will go home  on a short prescription of antibiotics today to treat his pyelonephritis.  We did discuss admission versus home, patient prefers to try antibiotics in outpatient setting. Gilford Rile has been ordered for patient.  He is to follow-up PCP after completion of medication.  They are agreeable with plan and treatment, patient is stable for discharge.    Portions of this note were generated with Lobbyist. Dictation errors may occur despite best attempts at proofreading.         Janeece Fitting, PA-C 07/15/21 1301    Daleen Bo, MD 07/15/21 1515

## 2021-07-15 NOTE — ED Notes (Signed)
Bladder scan showed >87mL on first scan and 25mL on second scan. RN aware.

## 2021-07-15 NOTE — Discharge Instructions (Addendum)
Le he recetado antibiotics para tratar su infeccion, por favor tome CEFDINIR 1 tableta dos veces diarias por los siguientes 7 dias.  Despues de completar los antibitiocs haga una cita con su doctor primario.

## 2021-07-15 NOTE — ED Triage Notes (Signed)
Patient arrived with complaints of abdominal pain, per EMS states he recently had surgery for a bowel obstruction.

## 2021-07-15 NOTE — ED Provider Notes (Signed)
[facility] DEPT Provider Note   CSN: [id number] Arrival date & time: 07/15/21  0550     History  Chief Complaint  Patient presents with   Abdominal Pain    Danny Walker is a 76 y.o. male.  HPI Patient is a 76 year old Spanish-speaking male from [place] who presents to the emergency department due to hypertension, prostatitis, liver mass, urinary retention, diverticulitis status post sigmoid resection, seropositive myasthenia gravis, who presents to the emergency department due to lower abdominal pain.  History obtained via [place] interpreter.  Patient states that he has been experiencing worsening lower abdominal pain.  Reports associated bladder incontinence.  Denies any urinary retention, nausea, vomiting, or diarrhea.    Home Medications Prior to Admission medications   Medication Sig Start Date End Date Taking? Authorizing Provider  Calcium Carb-Cholecalciferol ([employer] OYSTER SHELL CALCIUM-VIT D) 500-3.125 MG-MCG TABS Take by mouth.    [provider]  ferrous sulfate 325 (65 FE) MG tablet Basile 04/05/21   Masters, Joellen Jersey, DO  naproxen (NAPROSYN) 250 MG tablet TOME UNA TABLETA DOS VECES AL DIA CON ALIMENTO 02/15/21   Maudie Mercury, MD  omeprazole (PRILOSEC) 40 MG capsule Take 1 capsule (40 mg total) by mouth daily. 06/25/21   Narda Amber K, DO  oxybutynin (DITROPAN) 5 MG tablet Take 5 mg by mouth 3 (three) times daily.    [provider]  polyethylene glycol (MIRALAX) 17 g packet Take 17 g by mouth daily. 04/29/21   Marianna Payment, MD  predniSONE (DELTASONE) 10 MG tablet Take 20mg  daily. Patient taking differently: 10 mg. Take 10mg  daily 03/22/21   Narda Amber K, DO  pregabalin (LYRICA) 50 MG capsule TOME 1 CAPSULA POR VIA ORAL DOS VECES AL DIA DURANTE DOS SEMANAS, ENTONCES TRES VECES AL DIA 02/02/21   Sanjuan Dame, MD  pyridostigmine (MESTINON) 60 MG tablet Take 1 tablet in the morning and 1  tablet at lunch. 06/25/21   Patel, Arvin Collard K, DO  traMADol (ULTRAM) 50 MG tablet Take 1 tablet (50 mg total) by mouth every 6 (six) hours as needed. 06/09/21   Axel Filler, MD  traZODone (DESYREL) 50 MG tablet TOME UNA TABLETA TODOS LOS DIAS AL ACOSTARSE 01/23/21   Sanjuan Dame, MD  vitamin B-12 (CYANOCOBALAMIN) 1000 MCG tablet Take 1 tablet (1,000 mcg total) by mouth daily. 04/01/21   Masters, Katie, DO      Allergies    Patient has no known allergies.    Review of Systems   Review of Systems  All other systems reviewed and are negative. Ten systems reviewed and are negative for acute change, except as noted in the HPI.   Physical Exam Updated Vital Signs BP (!) 145/72 (BP Location: Right Arm)    Pulse 94    Temp 98.7 F (37.1 C) (Oral)    Resp 18    SpO2 97%  Physical Exam Vitals and nursing note reviewed.  Constitutional:      General: He is not in acute distress.    Appearance: Normal appearance. He is well-developed. He is not ill-appearing, toxic-appearing or diaphoretic.  HENT:     Head: Normocephalic and atraumatic.     Right Ear: External ear normal.     Left Ear: External ear normal.     Nose: Nose normal.     Mouth/Throat:     Mouth: Mucous membranes are moist.     Pharynx: Oropharynx is clear. No oropharyngeal exudate or posterior oropharyngeal erythema.  Eyes:     Extraocular Movements: Extraocular movements intact.  Cardiovascular:     Rate and Rhythm: Normal rate and regular rhythm.     Pulses: Normal pulses.     Heart sounds: Normal heart sounds. No murmur heard.   No friction rub. No gallop.  Pulmonary:     Effort: Pulmonary effort is normal. No respiratory distress.     Breath sounds: Normal breath sounds. No stridor. No wheezing, rhonchi or rales.  Abdominal:     General: Abdomen is flat.     Palpations: Abdomen is soft.     Tenderness: There is abdominal tenderness.     Comments: Abdomen is flat and soft.  Moderate tenderness noted diffusely  in the lower abdomen that appears to be worse in the right lower quadrant.  Musculoskeletal:        General: Normal range of motion.     Cervical back: Normal range of motion and neck supple. No tenderness.  Skin:    General: Skin is warm and dry.  Neurological:     General: No focal deficit present.     Mental Status: He is alert and oriented to person, place, and time.  Psychiatric:        Mood and Affect: Mood normal.        Behavior: Behavior normal.   ED Results / Procedures / Treatments   Labs (all labs ordered are listed, but only abnormal results are displayed) Labs Reviewed  URINE CULTURE  COMPREHENSIVE METABOLIC PANEL  CBC WITH DIFFERENTIAL/PLATELET  LIPASE, BLOOD  URINALYSIS, ROUTINE W REFLEX MICROSCOPIC   EKG None  Radiology No results found.  Procedures Procedures   Medications Ordered in ED Medications - No data to display  ED Course/ Medical Decision Making/ A&P                           Medical Decision Making Amount and/or Complexity of Data Reviewed Labs: ordered.  Patient is a 76 year old male with a complex medical history.  Presents today with lower abdominal pain as well as urinary incontinence.  It is in my shift and patient care is being transferred to [facility].  Patient pending all lab work as well as a bladder scan.  Disposition pending based on these results.  Final Clinical Impression(s) / ED Diagnoses Final diagnoses:  Lower abdominal pain    Rx / DC Orders ED Discharge Orders     None         Rayna Sexton, PA-C 07/15/21 0618    Maudie Flakes, MD 07/15/21 [phone]

## 2021-07-15 NOTE — Progress Notes (Signed)
NIF (best of 3 attempts)= >-40 FVC (best of 3 attempts)= 2.6 L

## 2021-07-17 LAB — URINE CULTURE: Culture: >=100000 — AB

## 2021-07-18 ENCOUNTER — Telehealth: Payer: Self-pay | Admitting: Emergency Medicine

## 2021-07-18 ENCOUNTER — Other Ambulatory Visit: Payer: Self-pay | Admitting: Student

## 2021-07-18 NOTE — Telephone Encounter (Signed)
Post ED Visit - Positive Culture Follow-up  Culture report reviewed by antimicrobial stewardship pharmacist: [facility] Team []  [address] Pharm.D. []  Heide Guile, Pharm.D., BCPS AQ-ID []  Parks Neptune, Pharm.D., BCPS []  Alycia Rossetti, Pharm.D., BCPS []  [place], [place].D., BCPS, AAHIVP []  Legrand Como, Pharm.D., BCPS, AAHIVP []  Salome Arnt, PharmD, BCPS []  Johnnette Gourd, PharmD, BCPS []  Hughes Better, PharmD, BCPS []  Leeroy Cha, PharmD []  Laqueta Linden, PharmD, BCPS []  Albertina Parr, PharmD  [facility] Team []  Leodis Sias, PharmD []  Lindell Spar, PharmD []  Royetta Asal, PharmD []  Graylin Shiver, Rph []  Rema Fendt) Glennon Mac, PharmD []  Arlyn Dunning, PharmD []  Netta Cedars, PharmD []  Dia Sitter, PharmD []  Leone Haven, PharmD []  Gretta Arab, PharmD []  Theodis Shove, PharmD []  Peggyann Juba, PharmD [x]  Reuel Boom, PharmD   Positive urine culture Treated with Cefdinir, organism sensitive to the same and no further patient follow-up is required at this time.  Sandi Raveling Vena Bassinger 07/18/2021, 11:38 AM

## 2021-07-19 ENCOUNTER — Encounter: Payer: Self-pay | Admitting: Internal Medicine

## 2021-07-19 ENCOUNTER — Other Ambulatory Visit: Payer: Self-pay

## 2021-07-19 ENCOUNTER — Ambulatory Visit (INDEPENDENT_AMBULATORY_CARE_PROVIDER_SITE_OTHER): Payer: Medicare Other | Admitting: Internal Medicine

## 2021-07-19 DIAGNOSIS — N390 Urinary tract infection, site not specified: Secondary | ICD-10-CM

## 2021-07-19 DIAGNOSIS — M545 Low back pain, unspecified: Secondary | ICD-10-CM

## 2021-07-19 MED ORDER — CEFDINIR 300 MG PO CAPS: 300.0000 mg | ORAL_CAPSULE | Freq: Two times a day (BID) | ORAL | 0 refills | Status: AC

## 2021-07-19 NOTE — Assessment & Plan Note (Addendum)
Patient was incidentally diagnosed with acute to subacute compression fracture on 2/23 during evaluation for right flank pain.  Patient endorses pain radiating from back down his leg.  He usually takes tramadol as needed for pain though currently not taking it daily.  Advised patient that he can continue to take tramadol as needed, would avoid naproxen given UTI with suspected pyelonephritis.  Encouraged use of Tylenol up to 1000 mg 3 times daily for pain.  Plan: -Had a fragility fracture, will need DEXA scan, osteoporosis work-up, please discuss this at next follow-up appointment

## 2021-07-19 NOTE — Patient Instructions (Signed)
Danny Walker, Sr. Danny Walker por permitirnos brindarle su atencin hoy. Hoy hablamos sobre la infeccin del tracto urinario y Conservation officer, historic buildings.  Infeccin: Termine el curso de Cefdinir que le dieron en la sala de Multimedia programmer, lo terminar el mircoles y comenzar la nueva receta de Cefdinir el mircoles. Los tomar [employer] sbado para un ciclo total de 10 das de medicacin antibitica que debera eliminar la infeccin. Si sigue teniendo Programme researcher, broadcasting/film/video despus de los 10 das, vuelva a la clnica para que podamos volver a Copy. Si el dolor de espalda empeora, tiene ardor al Garment/textile technologist, comienza a sentirse mal, tiene fiebre, vaya a la sala de emergencias para recibir tratamiento adicional.  Medicamentos para el dolor: tome el tramadol como lo estaba tomando anteriormente. Por favor, no tome el naproxeno. Puede tomar Tylenol para el dolor de 650 mg o 1000 mg, segn sea necesario. Puede tomar Tylenol de 1000 mg hasta tres veces al [employer].  Acceso a mi grfico: [email or web address]?  Haga un seguimiento en 1 semana para el seguimiento de la infeccin del tracto urinario.  Asegrese de llegar 15 minutos antes de su prxima cita. Si llega tarde, es posible que se IT trainer.  Esperamos verte la prxima vez. Llame a nuestra clnica al [phone] si tiene alguna pregunta o inquietud. El mejor horario para llamar es de lunes a viernes de 9 a. m. a 4 p. m., pero hay alguien disponible las 24 horas del da, los 7 das de la [place]. Si es fuera del horario de atencin o durante el fin de [place], llame al nmero principal del hospital y pregunte por el residente de guardia de medicina interna. Si necesita reposicin de medicamentos, por favor notifique a su farmacia con una semana de anticipacin y ellos nos enviarn una solicitud.  Gracias por dejarnos participar en su cuidado. Deseandote lo mejor!  Dra. Wayland Denis 27/06/2021, 15:33 Residente MI, PGY-1

## 2021-07-19 NOTE — Progress Notes (Signed)
CC: ED follow up: complicated UTI  HPI:  [name] is a Spanish-speaking 76 y.o. male with a past medical history stated below and presents today for ED follow up: complicated UTI. Please see problem based assessment and plan for additional details.  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   DDD (degenerative disc disease), lumbosacral    Debility    Diverticulitis 05/01/2020   Encephalopathy acute    Endotracheally intubated    Fatty liver    Foley catheter in place    Hip pain 09/30/2019   History of gallstones    History of prostatitis    Hypertension    IDA (iron deficiency anemia)    Leg weakness 03/23/2020   Leg weakness, bilateral 03/23/2020   Liver mass 09/17/2001   4.7cm , noted on [place] abd   Low back pain    Myasthenia gravis ([place])    Myasthenia gravis with acute exacerbation ([place]) 01/24/2020   Myasthenia gravis, 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   Nocturia    Perforation of sigmoid colon due to diverticulitis    Peritonitis ([place])    Protein-calorie malnutrition, severe 04/06/2018   Renal cyst, 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     cefdinir (OMNICEF) 300 MG capsule Take 1 capsule (300 mg total) by mouth 2 (two) times daily for 7 days. 14 capsule 0   ferrous sulfate 325 (65 FE) MG tablet [place] 90 tablet 1   naproxen (NAPROSYN) 250 MG tablet TOME UNA TABLETA DOS VECES AL DIA CON ALIMENTO 28 tablet 2   omeprazole (PRILOSEC) 40 MG capsule Take 1 capsule (40 mg total) by mouth daily. 90 capsule 3   oxybutynin (DITROPAN) 5 MG tablet Take 5 mg by mouth 3 (three) times daily.     polyethylene glycol (MIRALAX) 17 g packet Take 17 g by mouth daily. 14 each 0   predniSONE (DELTASONE) 10 MG tablet Take 20mg  daily. (Patient taking differently: 10 mg. Take 10mg  daily) 90 tablet 5   pregabalin (LYRICA) 50 MG capsule TOME 1 CAPSULA POR VIA ORAL DOS VECES AL DIA DURANTE DOS SEMANAS, ENTONCES TRES VECES AL DIA 90 capsule 0   pyridostigmine (MESTINON) 60 MG tablet Take 1 tablet in the morning and 1 tablet at lunch. 180 tablet 3   traMADol (ULTRAM) 50 MG tablet Take 1 tablet (50 mg total) by mouth every 6 (six) hours as needed. 30 tablet 1   traZODone (DESYREL) 50 MG tablet TOME UNA TABLETA TODOS LOS DIAS AL ACOSTARSE 90 tablet 1   vitamin B-12 (CYANOCOBALAMIN) 1000 MCG tablet Take 1 tablet (1,000 mcg total) by mouth daily. 90 tablet 0   No current facility-administered medications on file prior to visit.    Family History  Problem Relation Age of Onset  Hypertension Mother    Hypertension Father     Review of Systems: ROS negative except for what is noted on the assessment and plan.  Vitals:   07/19/21 1348 07/19/21 1402  BP: (!) 149/88 (!) 143/79  Pulse: 79 83  Temp: 98.2 F (36.8 C)   TempSrc: Oral   SpO2: 98%   Weight: 135 lb 8 oz (61.5 kg)   Height: 5\' 6"  (1.676 m)      Physical Exam: General: Well appearing Latin American male, NAD HENT: normocephalic, atraumatic EYES: conjunctiva non-erythematous, no scleral icterus CV: regular rate, normal rhythm, no murmurs, rubs, gallops. Pulmonary: normal work of breathing on RA, lungs clear to auscultation, no rales, wheezes, rhonchi Abdominal: non-distended, soft, mild tenderness to palpation right upper  quadrant and right lower quadrant, normal BS Skin: Warm and dry, no rashes or lesions Neurological: MS: awake, alert and oriented x3, normal speech and fund of knowledge Motor: moves all extremities antigravity MSK: Mild tenderness to palpation spinous processes of lumbar spine, moderate right CVA tenderness Psych: normal affect    Assessment & Plan:   See Encounters Tab for problem based charting.  Patient discussed with Dr. Jilda Panda, M.D. [facility], PGY-1 Pager: [phone] Date 07/19/2021 Time 3:06 PM

## 2021-07-19 NOTE — Assessment & Plan Note (Addendum)
Patient presents for ED follow-up for complicated UTI management.  Is Spanish-speaking, remote interpreter assists with interpretation.   Patient was diagnosed with UTI on 2/23, received diarrhea Rocephin, deemed stable for outpatient management, and discharged on cefdinir 20 mg twice daily for 7 days. Urine culture grew Klebsiella oxytoca that is sensitive to cephalosporins. Patient reports has had persistence of right flank pain.  He denies fever, dysuria, urinary frequency, malaise, hematuria.  He reports aside from right flank pain he has no other symptoms.  On exam he has moderate CVA tenderness and mild right upper and lower quadrant tenderness.  He has mild tenderness to lumbar spinous processes and reports this pain is separate from the pain from his infection.  He endorses adherence to antibiotic regimen and is currently on day 4 of 7.  On assessment, patient has persistent flank pain in the setting of recent complicated UTI diagnosis, possible pyelonephritis.  I feel he would benefit from extending his antibiotic course to 10 days.  Discussed if patient has persistence of pain after 10 days to follow-up for reevaluation.  Discussed if patient has worsening flank pain, fever, feeling unwell, to present to the emergency room for further evaluation.  Plan: -Patient will complete cefdinir prescribed by ED provider on Wednesday, March 1 -Ordered cefdinir 300 mg twice daily for 3 days to start Thursday March 2nd and end Saturday, March 4th for full 10-day course of cefdinir -Return precautions given

## 2021-07-20 NOTE — Progress Notes (Signed)
Internal Medicine Clinic Attending ? ?Case discussed with Dr. Zinoviev  At the time of the visit.  We reviewed the resident?s history and exam and pertinent patient test results.  I agree with the assessment, diagnosis, and plan of care documented in the resident?s note.  ?

## 2021-07-26 ENCOUNTER — Institutional Professional Consult (permissible substitution): Payer: Medicare Other | Admitting: Behavioral Health

## 2021-07-26 ENCOUNTER — Encounter: Payer: Medicare Other | Admitting: Internal Medicine

## 2021-07-26 NOTE — Progress Notes (Deleted)
ED follow-up for abd pain 2/23 -cefdinir 300 BID  ?Right sided pain ?UTI-> focal stranding along distal rt ureter ?-abx extended to 10days at clinic f/u 2/27, completed course March 4th ? ? ?Incidental compression fracture T11 (subacute) and L4 (acute) ?-Vit D ?-Dexa scan ? ?

## 2021-07-28 ENCOUNTER — Other Ambulatory Visit (HOSPITAL_COMMUNITY): Payer: Self-pay

## 2021-07-28 ENCOUNTER — Other Ambulatory Visit: Payer: Self-pay

## 2021-07-28 ENCOUNTER — Ambulatory Visit (HOSPITAL_COMMUNITY)
Admission: RE | Admit: 2021-07-28 | Discharge: 2021-07-28 | Disposition: A | Discharge status: Home / Self Care | Payer: Medicare Other | Source: Ambulatory Visit | Attending: Internal Medicine | Admitting: Internal Medicine

## 2021-07-28 ENCOUNTER — Other Ambulatory Visit: Payer: Self-pay | Admitting: *Deleted

## 2021-07-28 ENCOUNTER — Ambulatory Visit (INDEPENDENT_AMBULATORY_CARE_PROVIDER_SITE_OTHER): Payer: Medicare Other | Admitting: Student

## 2021-07-28 VITALS — BP 146/73 | HR 83 | Temp 98.4°F

## 2021-07-28 DIAGNOSIS — Z87440 Personal history of urinary (tract) infections: Secondary | ICD-10-CM

## 2021-07-28 DIAGNOSIS — M545 Low back pain, unspecified: Secondary | ICD-10-CM

## 2021-07-28 DIAGNOSIS — M79604 Pain in right leg: Secondary | ICD-10-CM | POA: Diagnosis present

## 2021-07-28 MED ORDER — TRAMADOL HCL 50 MG PO TABS: 50.0000 mg | ORAL_TABLET | Freq: Four times a day (QID) | ORAL | 1 refills | Status: AC | PRN

## 2021-07-28 NOTE — Patient Instructions (Addendum)
[name], it was a pleasure seeing you today! ? ?Today we discussed: ? ?- Me gustar?a que volviera a su ur?logo por los s?ntomas urinarios. ? ?- He ordenado radiograf?as para evaluar m?s a fondo la cadera y la pierna. ? ?Follow-up:  as needed   ? ?Please make sure to arrive 15 minutes prior to your next appointment. If you arrive late, you may be asked to reschedule.  ? ?We look forward to seeing you next time. Please call our clinic at [phone] if you have any questions or concerns. The best time to call is Monday-Friday from 9am-4pm, but there is someone available 24/7. If after hours or the weekend, call the main hospital number and ask for the Internal Medicine Resident On-Call. If you need medication refills, please notify your pharmacy one week in advance and they will send [place] a request. ? ?Thank you for letting us take part in your care. Wishing you the best! ? ?Thank you, ?Sanjuan Dame, MD ? ?

## 2021-07-30 ENCOUNTER — Encounter: Payer: Self-pay | Admitting: Student

## 2021-07-30 DIAGNOSIS — M79604 Pain in right leg: Secondary | ICD-10-CM | POA: Insufficient documentation

## 2021-07-30 NOTE — Assessment & Plan Note (Signed)
Patient returns to clinic today after recent urinary tract infection following a urologic procedure. After completing course of cefdinir patient began to experience back and right leg pain, which he reports is similar to when he previously had urinary tract infection. He does not have any systemic symptoms, including fever, chills, body aches. Also is not experiencing urinary symptoms including dysuria, polyuria, suprapubic tenderness.  ? ?Patient is afebrile, hemodynamically stable. Unlikely patient has pyelonephritis given his current clinical picture. We will plan to have him follow-up with his urologist. ? ?- Follow-up with urologist ?

## 2021-07-30 NOTE — Progress Notes (Addendum)
? ?  CC: leg pain ? ?HPI: ? ?[name] is a 76 y.o. person with medical history as below presenting to [facility] for leg pain. ? ?Please see problem-based list for further details, assessments, and plans. ? ?Past Medical History:  ?Diagnosis Date  ? Acute blood loss anemia   ? Anxiety 06/30/2020  ? BPH (benign prostatic hyperplasia)   ? Bulbar weakness ([place]) 04/03/2018  ? 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 DDD (degenerative disc disease), lumbosacral   ? Debility   ? Diverticulitis 05/01/2020  ? Encephalopathy acute   ? Endotracheally intubated   ? Fatty liver   ? Foley catheter in place   ? Hip pain 09/30/2019  ? History of gallstones   ? History of prostatitis   ? Hypertension   ? IDA (iron deficiency anemia)   ? Leg weakness 03/23/2020  ? Leg weakness, bilateral 03/23/2020  ? Liver mass 09/17/2001  ? 4.7cm , noted on [place] abd  ? Low back pain   ? Myasthenia gravis ([place])   ? Myasthenia gravis with acute exacerbation ([place]) 01/24/2020  ? 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 Nocturia   ? Perforation of sigmoid colon due to diverticulitis   ? Peritonitis ([place])   ? Protein-calorie malnutrition, severe 04/06/2018  ? Renal cyst, 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  ? 1.3 cm simple, noted on [place] ABD  ? Right shoulder pain 08/20/2019  ? Spondylosis Lumbar  ? Steroid-induced hyperglycemia   ? Superficial bruising of arm, right, initial encounter 06/30/2020  ? Umbilical hernia   ? Unintentional weight loss 04/03/2018  ? Urinary retention 02/08/2018  ? Visual disturbance 03/23/2020  ? Weakness of both legs 02/2020  ? ?Review of Systems:  As per HPI ? ?Physical Exam: ? ?Vitals:  ? 07/28/21 1329  ?BP: (!) 146/73  ?Pulse: 83  ?Temp: 98.4 ?F (36.9 ?C)  ?TempSrc: Oral  ?SpO2: 99%  ? ?General: Resting comfortably in chair, no acute distress ?CV: Regular rate, rhythm. No murmurs appreciated.  ?Pulm: Normal respiratory effort on room air. Clear to ausculation bilaterally. ?Abd: No suprapubic tenderness appreciated. ?MSK: Normal bulk,  tone. No muscle tenderness in thighs or calves. No spinal bony tenderness or paraspinal tenderness. Costovertebral angle tenderness present on right, not on left. Bilateral hips non-tender to palpation. Range of motion limited given patient in wheelchair, but normal passive abduction, adduction, flexion of bilateral hips. Bilateral knees without bony tenderness. Anterior/posterior drawers and valgus/vagus stress testing normal bilateral knees.  ?Skin: Warm, dry. No rashes or lesions appreciated. ?Neuro: Awake, alert, conversing appropriately. Unable to assess strength in right leg d/t pain. Left leg 5/5 motor strength. Sensation in tact bilateral lower extremities. ?Psych: Normal mood, affect, speech. ? ?Assessment & Plan:  ? ?See Encounters Tab for problem based charting. ? ?Patient discussed with Dr. Evette Doffing ? ?

## 2021-07-30 NOTE — Assessment & Plan Note (Addendum)
Patient is presenting to clinic today accompanied by his stepdaughter to discuss one week history of right leg and back pain. An interpreter was used for this encounter. Patient was recently diagnosed with urinary tract infection and completed cefdinir course last week. After finishing this course, patient reports severe right leg pain and back pain. He does not remember if this was a sudden onset or gradual onset. He denies any injuries or trauma prior to this. Since onset, patient reports worsening pain all over the leg, no aggravating or relieving factors noticed by patient. It is difficult for him to characterize the pain, but denies sharp shooting pain down his legs. When questioned, patient cannot disclose a particular area that is worse, but continues to report "hurting all over." He usually can ambulate with a cane, but with his severe pain he has been using a walker. Patient is on cyclobenzaprine and tramadol, both of which did not alleviate the pain.  ? ?Patient's stepdaughter reports this pain was similar to when he was initially diagnosed with a urinary tract infection. Prior to this urinary tract infection, patient had urologic procedure at [facility]. Patient denies dysuria, polyuria, abdominal pain, suprapubic tenderness. He does mention intermittent subjective fevers, but otherwise denies chills, body aches, night sweats.  ? ?Examination notable for costovertebral angle tenderness on right but otherwise no obvious abnormalities on back, leg.Patient is neurologically in tact as well. No spinal tenderness or red flag symptoms for back pain. He is afebrile and without systemic symptoms, making pyelonephritis less likely. He is not experiencing any urinary symptoms, therefore we will hold off obtaining UA/culture. Unclear etiology of patient's pain, therefore will obtain imaging of knee and hip for further evaluation. Will also encourage him to return to urologist. ? ?Patient has also run out of  cyclobenzaprine. Patient was previously taking cyclobenzaprine, tramadol, and naproxen. Patient believes that each of these medications are for pain in different parts of his body. Patient's stepdaughter is also concerned regarding the amount of pain medications that he is on. Instructed patient to no longer take naproxen and discussed not re-filling cyclobenzaprine at this time. Will continue with tramadol as previously prescribed. ? ?- XR knee, hip ?- Tramadol '50mg'$  q6h PRN ?- Return to urologist ?

## 2021-08-02 NOTE — Progress Notes (Signed)
Internal Medicine Clinic Attending ? ?Case discussed with Dr. Braswell  At the time of the visit.  We reviewed the resident?s history and exam and pertinent patient test results.  I agree with the assessment, diagnosis, and plan of care documented in the resident?s note.  ?

## 2021-08-03 ENCOUNTER — Telehealth: Payer: Self-pay | Admitting: Neurology

## 2021-08-03 NOTE — Telephone Encounter (Signed)
Patients step daughter called and stated Danny Walker is having trouble with his right knee, having trouble walking.  She stated he sometimes complains of seeing double.  She just has a few concerns and also has questions about a medication she has seen. ?

## 2021-08-03 NOTE — Telephone Encounter (Signed)
Noted.  Pt was evaluated by PCP on 3/8 for leg pain thought to be secondary to UTI and referred to follow-up with his urologist.  Keep appointment with me on 4/14. ?

## 2021-08-03 NOTE — Telephone Encounter (Signed)
Unable to speak to patients step daughter as she is not on patients DPR. ? ?Called patients step daughter back and informed her that we are unable to speak to her in regards to patient as she is not listed on DPR. Patient became very offended by this and stated she didn't want to discuss anything and wanted to schedule an appt. I tried telling Danny Walker about the message that I received from the front and she would not let me speak and kept interrupting me. She then said she wanted to discuss medications with the doctor at the office and wanted to thank her for her help. I informed Danny Walker that I was very confused of what she wanted because she kept changing from wanting to thank the doctor, to wanting to make an appt, to wanting to discuss his vision and leg pain. I then informed Danny Walker that she is unable to make an appt for patient due to not being on the DPR and she raised her voice saying "HE DOES NOT SPEAK ENGLISH!!" I informed Danny Walker that we have a translator that will help [place]. Danny Walker then ended the call.  ?

## 2021-08-10 ENCOUNTER — Telehealth: Payer: Self-pay | Admitting: *Deleted

## 2021-08-21 NOTE — Telephone Encounter (Signed)
Courtesy call from [employer] from the [facility] .  Patient was found deceased  in the floor this morning by his daughter .  Daughter had gone overt this morning to check on patient after she was unable to contact him by phone.  Wyona Almas stated that the Clinic Physician will be contacted by [employer] for the Death Certificate. ?

## 2021-08-21 DEATH — deceased

## 2021-09-01 ENCOUNTER — Ambulatory Visit: Payer: Medicare Other | Admitting: Neurology

## 2022-03-30 IMAGING — CT CT ABD-PELV W/ CM
2 of 5 series · 15 of 46 positions shown, 17 images · IV contrast (Omni 300)
Comparison: 08/14/2020

CLINICAL DATA: Diverticulitis suspected. Lower abdominal pain, gait
abnormality, generalized weakness.

EXAM:
CT ABDOMEN AND PELVIS WITH CONTRAST
TECHNIQUE: Multidetector CT imaging of the abdomen and pelvis was performed
using the standard protocol following bolus administration of
intravenous contrast.
CONTRAST:  75mL OMNIPAQUE IOHEXOL 350 MG/ML SOLN

[Series 3: a/p w/ 5mm · axial · 0.81mm/px · z∈[-552,-137]mm · 12 of 93 slices shown, 14 images]
[im 5/93  soft-tissue]
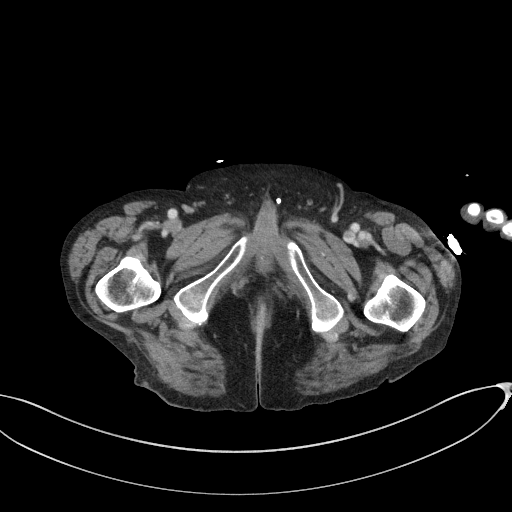
[im 5/93  bone]
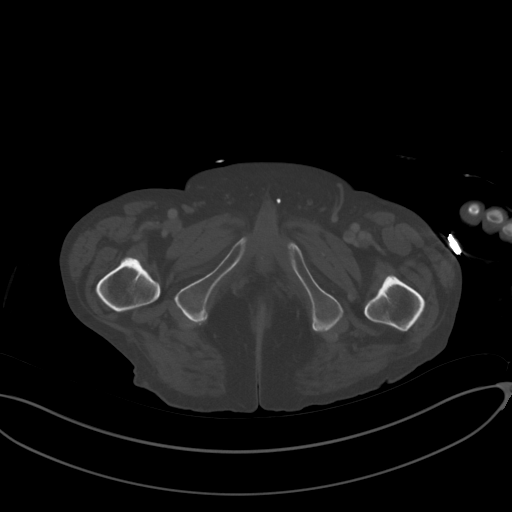
[im 15/93  soft-tissue]
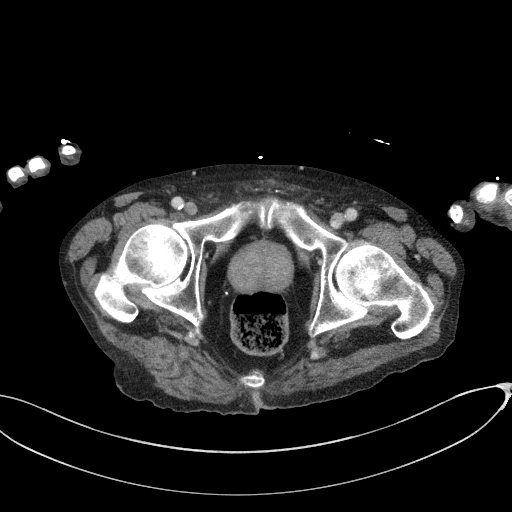
[im 20/93  soft-tissue]
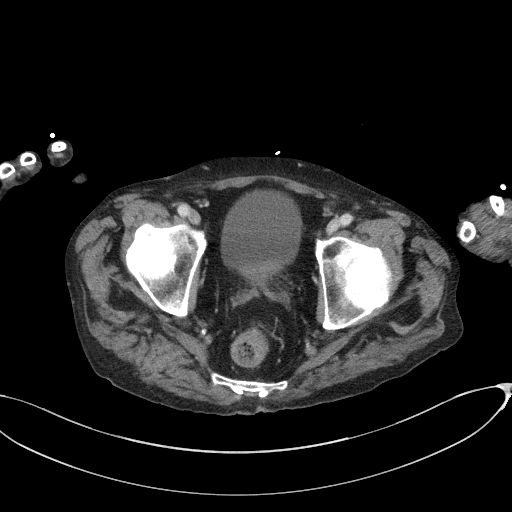
[im 30/93  soft-tissue]
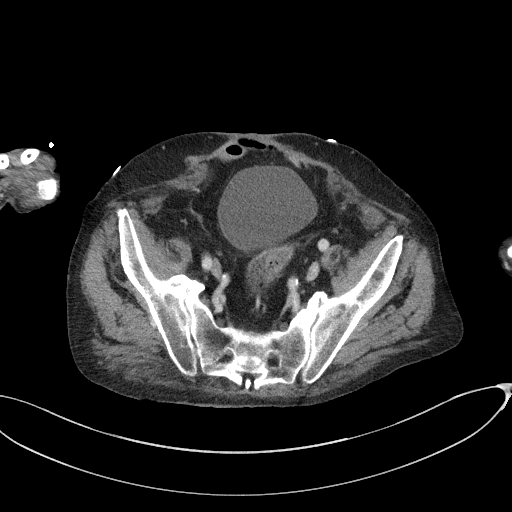
[im 34/93  soft-tissue]
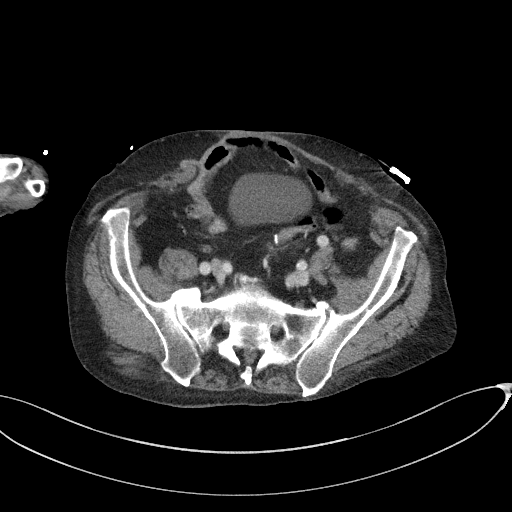
[im 44/93  soft-tissue]
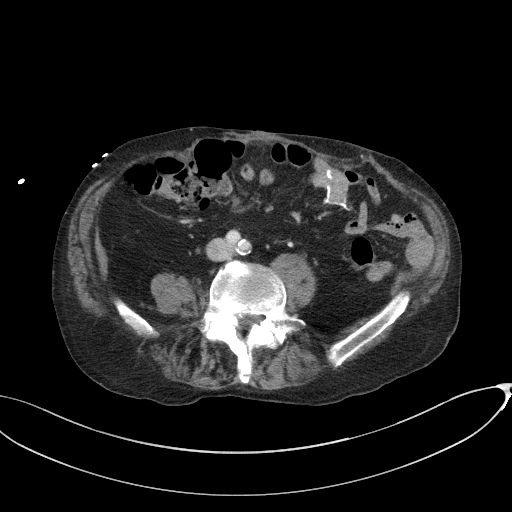
[im 49/93  soft-tissue]
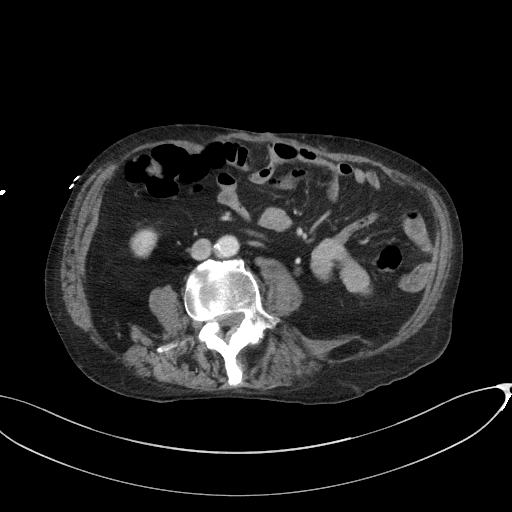
[im 59/93  soft-tissue]
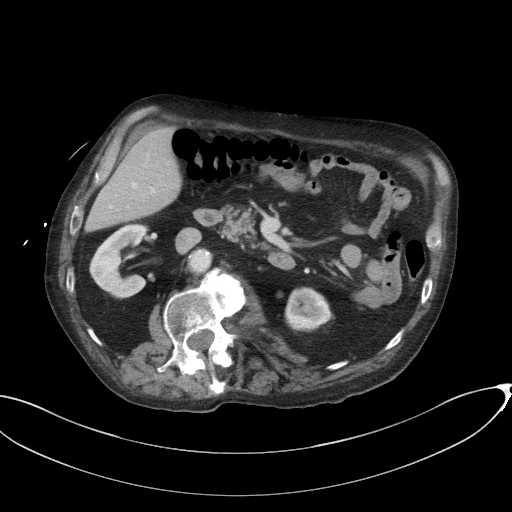
[im 63/93  soft-tissue]
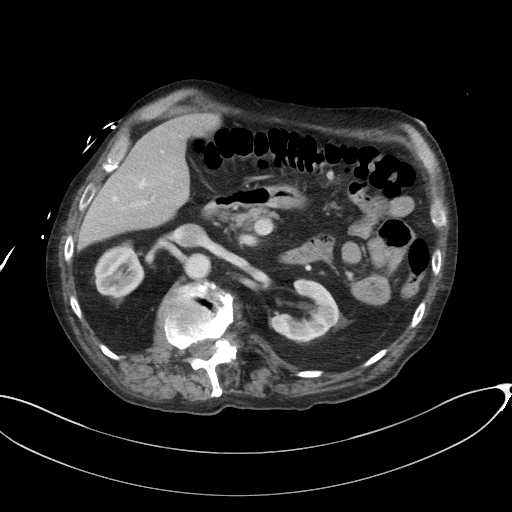
[im 63/93  bone]
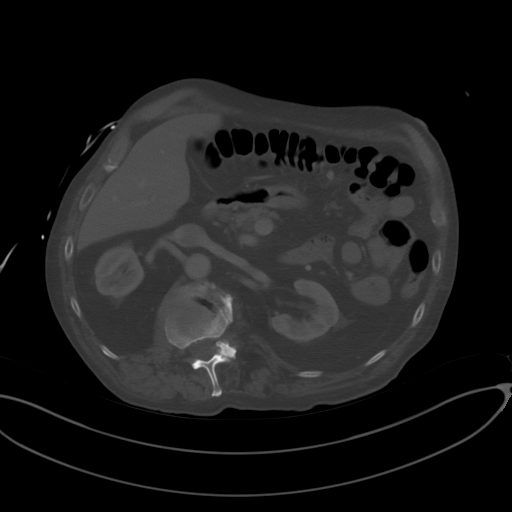
[im 73/93  soft-tissue]
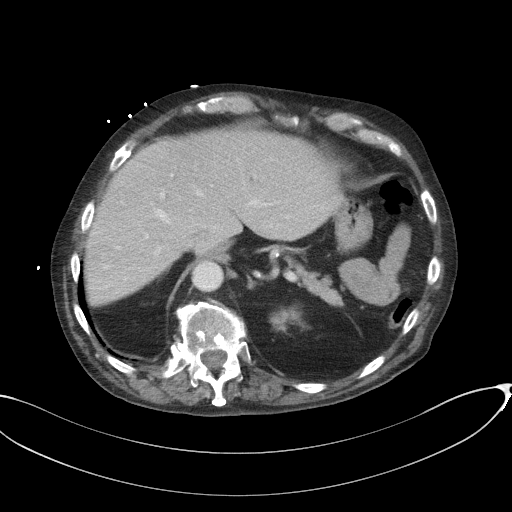
[im 78/93  soft-tissue]
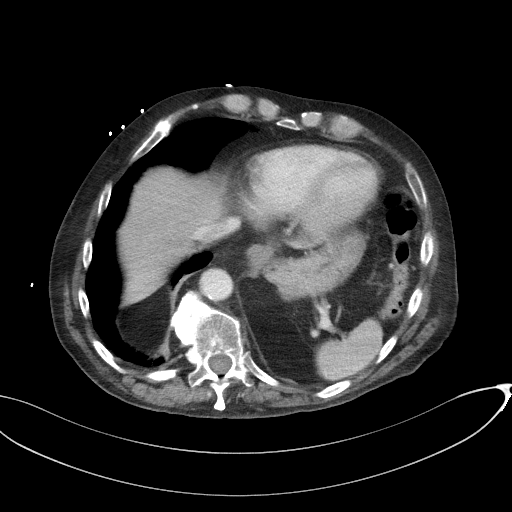
[im 88/93  soft-tissue]
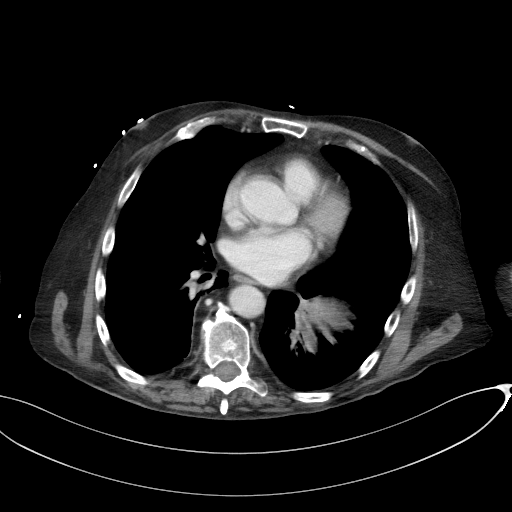

[Series 6: a/p w/ cor · coronal · 0.80mm/px · 3 of 139 slices shown]
[im 47/139  soft-tissue]
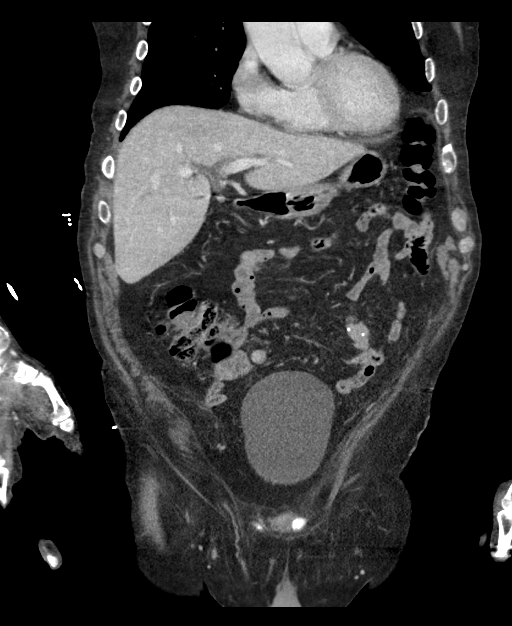
[im 62/139  soft-tissue]
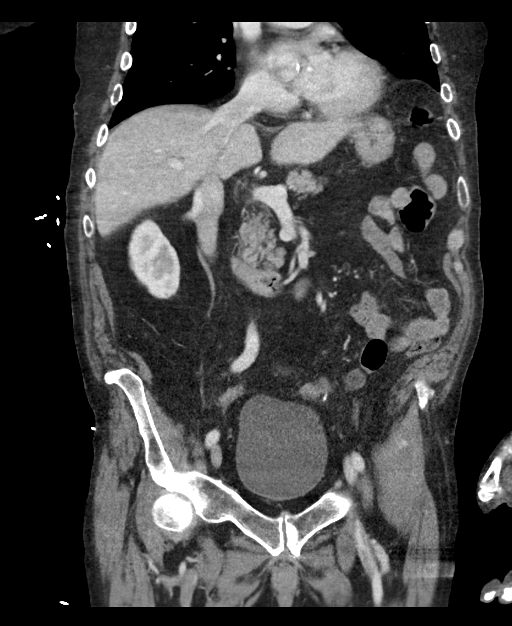
[im 77/139  soft-tissue]
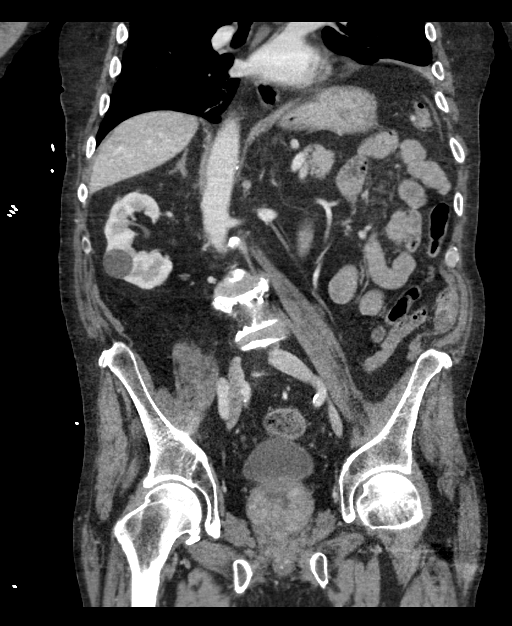

[15 of 46 positions shown; findings below may reference images not displayed]

FINDINGS: Lower chest: Stable mild elevation of the left hemidiaphragm. The
visualized lung bases are clear. Cardiac size within normal limits.
No pericardial effusion.

Hepatobiliary: Status post cholecystectomy. No intra or extrahepatic
biliary ductal dilation. Stable 16 mm hypodensity within the right
hepatic lobe adjacent to the gallbladder fossa likely representing a
small hemangioma. The liver is otherwise unremarkable.

Pancreas: Unremarkable

Spleen: Unremarkable

Adrenals/Urinary Tract: The adrenal glands are unremarkable. The
kidneys are normal in size and position. Simple cortical cyst noted
within the a lower pole of the right kidney. The kidneys are
otherwise unremarkable. The bladder is unremarkable.

Stomach/Bowel: Surgical anastomotic staple lines are seen within the
mid small bowel and distal sigmoid colon in keeping with history of
a sigmoid colectomy, diverting colostomy, and subsequent takedown.
The stomach, small bowel, and large bowel are otherwise
unremarkable. The appendix is diminutive, but is unremarkable. No
evidence of obstruction or focal inflammation. No free
intraperitoneal gas or fluid.

Vascular/Lymphatic: Moderate aortoiliac atherosclerotic
calcification. No aortic aneurysm. No pathologic adenopathy within
the abdomen and pelvis.

Reproductive: The prostate gland is moderately enlarged. Defect
within the central gland likely relates to prior trans urethral
resection. Seminal vesicles are unremarkable.

Other: There is diastasis of the rectus abdominus musculature in
marked attenuation of the subcutaneous fat anterior to the ventral
abdominal fascia. No discrete hernia identified. The rectum is
unremarkable.

Musculoskeletal: No acute bone abnormality. Moderate lumbar
dextroscoliosis, apex right at L2. Degenerative changes are seen
throughout the lumbar spine with resultant marked right
neuroforaminal narrowing at L4-5 and L5-S1. No lytic or blastic
bone lesions are identified.
IMPRESSION: No acute intra-abdominal pathology identified. No definite
radiographic explanation for the patient's reported symptoms.

Incidental findings as noted above. Degenerative changes noted
throughout the lumbar spine with superimposed lumbar dextroscoliosis
with multilevel neuroforaminal narrowing identified, most severe on
the right at L4-5 and L5-S1.

Aortic Atherosclerosis ([id number]).

## 2024-06-11 ENCOUNTER — Other Ambulatory Visit (HOSPITAL_COMMUNITY): Payer: Self-pay
